# Patient Record
Sex: Female | Born: 1990
Health system: Southern US, Community
[De-identification: ages and names within clinical notes are randomized; demographics above are authoritative.]

## PROBLEM LIST (undated history)

## (undated) DIAGNOSIS — N83209 Unspecified ovarian cyst, unspecified side: Secondary | ICD-10-CM

## (undated) DIAGNOSIS — D573 Sickle-cell trait: Secondary | ICD-10-CM

## (undated) DIAGNOSIS — E669 Obesity, unspecified: Secondary | ICD-10-CM

## (undated) DIAGNOSIS — J45909 Unspecified asthma, uncomplicated: Secondary | ICD-10-CM

## (undated) HISTORY — PX: INCISION AND DRAINAGE ABSCESS: SHX5864

## (undated) HISTORY — PX: CARPAL TUNNEL RELEASE: SHX101

---
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
## 2010-05-04 ENCOUNTER — Emergency Department (HOSPITAL_COMMUNITY): Admission: EM | Admit: 2010-05-04 | Discharge: 2010-05-04 | Payer: Self-pay | Admitting: Emergency Medicine

## 2010-09-11 LAB — URINALYSIS, ROUTINE W REFLEX MICROSCOPIC
Bilirubin Urine: NEGATIVE
Hgb urine dipstick: NEGATIVE
Ketones, ur: NEGATIVE mg/dL
Nitrite: NEGATIVE
Protein, ur: NEGATIVE mg/dL
Specific Gravity, Urine: 1.015 (ref 1.005–1.030)
Urobilinogen, UA: 0.2 mg/dL (ref 0.0–1.0)

## 2010-09-11 LAB — DIFFERENTIAL
Basophils Absolute: 0 10*3/uL (ref 0.0–0.1)
Basophils Relative: 0 % (ref 0–1)
Lymphocytes Relative: 20 % (ref 12–46)
Monocytes Absolute: 0.4 10*3/uL (ref 0.1–1.0)
Neutro Abs: 4.8 10*3/uL (ref 1.7–7.7)
Neutrophils Relative %: 72 % (ref 43–77)

## 2010-09-11 LAB — CBC
Hemoglobin: 12.8 g/dL (ref 12.0–15.0)
MCHC: 34.7 g/dL (ref 30.0–36.0)
Platelets: 188 10*3/uL (ref 150–400)
RDW: 12.1 % (ref 11.5–15.5)

## 2010-09-11 LAB — BASIC METABOLIC PANEL
BUN: 8 mg/dL (ref 6–23)
CO2: 26 mEq/L (ref 19–32)
Calcium: 9.1 mg/dL (ref 8.4–10.5)
Creatinine, Ser: 0.78 mg/dL (ref 0.4–1.2)
GFR calc non Af Amer: >60 mL/min (ref >60)
Glucose, Bld: 89 mg/dL (ref 70–99)
Sodium: 142 mEq/L (ref 135–145)

## 2010-09-11 LAB — POCT PREGNANCY, URINE: Preg Test, Ur: NEGATIVE

## 2010-09-12 LAB — POCT I-STAT, CHEM 8
BUN: 3 mg/dL — ABNORMAL LOW (ref 6–23)
Chloride: 105 mEq/L (ref 96–112)
HCT: 38 % (ref 36.0–46.0)
Potassium: 3.4 mEq/L — ABNORMAL LOW (ref 3.5–5.1)

## 2010-09-12 LAB — DIFFERENTIAL
Basophils Absolute: 0 10*3/uL (ref 0.0–0.1)
Basophils Relative: 0 % (ref 0–1)
Eosinophils Absolute: 0.1 10*3/uL (ref 0.0–0.7)
Eosinophils Relative: 1 % (ref 0–5)
Lymphocytes Relative: 20 % (ref 12–46)
Lymphs Abs: 1.4 10*3/uL (ref 0.7–4.0)
Monocytes Relative: 5 % (ref 3–12)
Neutro Abs: 5.4 10*3/uL (ref 1.7–7.7)
Neutrophils Relative %: 75 % (ref 43–77)

## 2010-09-12 LAB — URINALYSIS, ROUTINE W REFLEX MICROSCOPIC
Bilirubin Urine: NEGATIVE
Hgb urine dipstick: NEGATIVE
Nitrite: NEGATIVE
Specific Gravity, Urine: 1.005 (ref 1.005–1.030)
Specific Gravity, Urine: 1.014 (ref 1.005–1.030)
Urobilinogen, UA: 0.2 mg/dL (ref 0.0–1.0)
Urobilinogen, UA: 1 mg/dL (ref 0.0–1.0)
pH: 6 (ref 5.0–8.0)

## 2010-09-12 LAB — CBC
Hemoglobin: 12.5 g/dL (ref 12.0–15.0)
MCHC: 34.7 g/dL (ref 30.0–36.0)
RBC: 3.89 MIL/uL (ref 3.87–5.11)
RBC: 4.01 MIL/uL (ref 3.87–5.11)
WBC: 7 10*3/uL (ref 4.0–10.5)

## 2010-09-12 LAB — COMPREHENSIVE METABOLIC PANEL
ALT: 9 U/L (ref 0–35)
AST: 16 U/L (ref 0–37)
Alkaline Phosphatase: 36 U/L — ABNORMAL LOW (ref 39–117)
CO2: 27 mEq/L (ref 19–32)
Calcium: 8.6 mg/dL (ref 8.4–10.5)
Chloride: 108 mEq/L (ref 96–112)
GFR calc Af Amer: >60 mL/min (ref >60)
GFR calc non Af Amer: >60 mL/min (ref >60)
Potassium: 3.8 mEq/L (ref 3.5–5.1)
Sodium: 140 mEq/L (ref 135–145)
Total Bilirubin: 0.6 mg/dL (ref 0.3–1.2)

## 2010-09-12 LAB — WET PREP, GENITAL: Yeast Wet Prep HPF POC: NONE SEEN

## 2010-09-12 LAB — GC/CHLAMYDIA PROBE AMP, GENITAL: Chlamydia, DNA Probe: NEGATIVE

## 2010-09-12 LAB — POCT PREGNANCY, URINE: Preg Test, Ur: NEGATIVE

## 2010-09-12 LAB — LIPASE, BLOOD: Lipase: 18 U/L (ref 11–59)

## 2011-02-20 ENCOUNTER — Emergency Department (HOSPITAL_COMMUNITY)
Admission: EM | Admit: 2011-02-20 | Discharge: 2011-02-20 | Disposition: A | Discharge status: Home / Self Care | Payer: Self-pay | Attending: Emergency Medicine | Admitting: Emergency Medicine

## 2011-02-20 DIAGNOSIS — K089 Disorder of teeth and supporting structures, unspecified: Secondary | ICD-10-CM | POA: Insufficient documentation

## 2011-02-20 DIAGNOSIS — K029 Dental caries, unspecified: Secondary | ICD-10-CM | POA: Insufficient documentation

## 2011-03-15 LAB — URINALYSIS, ROUTINE W REFLEX MICROSCOPIC
Bilirubin Urine: NEGATIVE
Ketones, ur: NEGATIVE
Nitrite: NEGATIVE
Urobilinogen, UA: 0.2

## 2011-03-15 LAB — COMPREHENSIVE METABOLIC PANEL
ALT: 12
AST: 20
CO2: 23
Calcium: 9
Creatinine, Ser: 0.81
Glucose, Bld: 111 — ABNORMAL HIGH
Sodium: 136
Total Protein: 7

## 2011-03-15 LAB — URINE CULTURE: Colony Count: >=100000

## 2011-03-15 LAB — LIPASE, BLOOD: Lipase: 17

## 2011-03-15 LAB — ETHANOL: Alcohol, Ethyl (B): <5

## 2011-03-19 LAB — POCT PREGNANCY, URINE: Operator id: 239701

## 2011-06-07 ENCOUNTER — Emergency Department (HOSPITAL_COMMUNITY)
Admission: EM | Admit: 2011-06-07 | Discharge: 2011-06-08 | Disposition: A | Discharge status: Home / Self Care | Payer: Self-pay | Attending: Emergency Medicine | Admitting: Emergency Medicine

## 2011-06-07 ENCOUNTER — Encounter: Payer: Self-pay | Admitting: *Deleted

## 2011-06-07 DIAGNOSIS — IMO0002 Reserved for concepts with insufficient information to code with codable children: Secondary | ICD-10-CM | POA: Insufficient documentation

## 2011-06-07 DIAGNOSIS — T07XXXA Unspecified multiple injuries, initial encounter: Secondary | ICD-10-CM | POA: Insufficient documentation
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
## 2011-06-08 ENCOUNTER — Emergency Department (HOSPITAL_COMMUNITY): Payer: Self-pay

## 2011-06-08 MED ORDER — TETANUS-DIPHTH-ACELL PERTUSSIS 5-2.5-18.5 LF-MCG/0.5 IM SUSP
0.5000 mL | Freq: Once | INTRAMUSCULAR | Status: AC
  Administered 2011-06-08: 0.5 mL via INTRAMUSCULAR
  Filled 2011-06-08: qty 0.5

## 2011-06-08 MED ORDER — OXYCODONE-ACETAMINOPHEN 5-325 MG PO TABS
1.0000 | ORAL_TABLET | Freq: Once | ORAL | Status: AC
  Administered 2011-06-08: 1 via ORAL
  Filled 2011-06-08: qty 1

## 2011-06-08 MED ORDER — BACITRACIN-NEOMYCIN-POLYMYXIN 400-5-5000 EX OINT
TOPICAL_OINTMENT | Freq: Once | CUTANEOUS | Status: AC
  Administered 2011-06-08: 01:00:00 via TOPICAL
  Filled 2011-06-08: qty 1

## 2011-06-08 NOTE — ED Notes (Signed)
Patient stable upon discharge.  
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
## 2011-08-20 ENCOUNTER — Emergency Department (HOSPITAL_COMMUNITY)
Admission: EM | Admit: 2011-08-20 | Discharge: 2011-08-21 | Disposition: A | Discharge status: Home Health | Payer: Self-pay | Attending: Emergency Medicine | Admitting: Emergency Medicine

## 2011-08-20 ENCOUNTER — Encounter (HOSPITAL_COMMUNITY): Payer: Self-pay | Admitting: *Deleted

## 2011-08-20 DIAGNOSIS — R10816 Epigastric abdominal tenderness: Secondary | ICD-10-CM | POA: Insufficient documentation

## 2011-08-20 DIAGNOSIS — F17200 Nicotine dependence, unspecified, uncomplicated: Secondary | ICD-10-CM | POA: Insufficient documentation

## 2011-08-20 DIAGNOSIS — R109 Unspecified abdominal pain: Secondary | ICD-10-CM | POA: Insufficient documentation

## 2011-08-20 DIAGNOSIS — R1110 Vomiting, unspecified: Secondary | ICD-10-CM | POA: Insufficient documentation

## 2011-08-20 DIAGNOSIS — R197 Diarrhea, unspecified: Secondary | ICD-10-CM | POA: Insufficient documentation

## 2011-08-20 DIAGNOSIS — Z79899 Other long term (current) drug therapy: Secondary | ICD-10-CM | POA: Insufficient documentation

## 2011-08-20 DIAGNOSIS — J45909 Unspecified asthma, uncomplicated: Secondary | ICD-10-CM | POA: Insufficient documentation

## 2011-08-20 LAB — URINALYSIS, ROUTINE W REFLEX MICROSCOPIC
Glucose, UA: NEGATIVE mg/dL
Ketones, ur: NEGATIVE mg/dL
Nitrite: NEGATIVE
Protein, ur: NEGATIVE mg/dL

## 2011-08-20 LAB — POCT PREGNANCY, URINE: Preg Test, Ur: NEGATIVE

## 2011-08-20 MED ORDER — ONDANSETRON 4 MG PO TBDP
4.0000 mg | ORAL_TABLET | Freq: Once | ORAL | Status: AC
  Administered 2011-08-20: 4 mg via ORAL
  Filled 2011-08-20: qty 1

## 2011-08-20 NOTE — ED Notes (Signed)
The pt has had abd pain and vomiting with diarrhea since 0600am today.  She ate some fish last pm and  She reheated it and she has been ill since them

## 2011-08-21 MED ORDER — ONDANSETRON HCL 4 MG/2ML IJ SOLN
4.0000 mg | Freq: Once | INTRAMUSCULAR | Status: AC
  Administered 2011-08-21: 4 mg via INTRAVENOUS
  Filled 2011-08-21: qty 2

## 2011-08-21 MED ORDER — SODIUM CHLORIDE 0.9 % IV BOLUS (SEPSIS)
1000.0000 mL | Freq: Once | INTRAVENOUS | Status: AC
  Administered 2011-08-21: 1000 mL via INTRAVENOUS

## 2011-08-21 MED ORDER — PROMETHAZINE HCL 25 MG PO TABS: 12.5000 mg | ORAL_TABLET | Freq: Four times a day (QID) | ORAL | Status: DC | PRN

## 2011-08-21 NOTE — ED Provider Notes (Signed)
Medical screening examination/treatment/procedure(s) were performed by non-physician practitioner and as supervising physician I was immediately available for consultation/collaboration.   Alita Waldren A. Patrica Duel, MD 08/21/11 2255

## 2011-08-21 NOTE — Discharge Instructions (Signed)
B.R.A.T. Diet    Your doctor has recommended the B.R.A.T. diet for you or your child until the condition improves. This is often used to help control diarrhea and vomiting symptoms. If you or your child can tolerate clear liquids, you may have:  · Bananas.   · Rice.   · Applesauce.   · Toast (and other simple starches such as crackers, potatoes, noodles).   Be sure to avoid dairy products, meats, and fatty foods until symptoms are better. Fruit juices such as apple, grape, and prune juice can make diarrhea worse. Avoid these. Continue this diet for 2 days or as instructed by your caregiver.  Document Released: 06/11/2005 Document Revised: 02/21/2011 Document Reviewed: 11/28/2006  [employer]® Patient Information ©2012 [employer].

## 2011-08-21 NOTE — ED Provider Notes (Signed)
History     CSN: [id number]  Arrival date & time 08/20/11  2116   First MD Initiated Contact with Patient 08/21/11 0011      Chief Complaint  Patient presents with  . Emesis    (Consider location/radiation/quality/duration/timing/severity/associated sxs/prior treatment) HPI Comments: Patient developed vomiting and diarrhea after eating, we heated fish from her refrigerator  Patient is a 21 y.o. female presenting with vomiting. The history is provided by the patient.  Emesis  This is a new problem. The current episode started yesterday. The problem occurs more than 10 times per day. The problem has not changed since onset.The emesis has an appearance of bilious material. There has been no fever. Associated symptoms include abdominal pain and diarrhea. Pertinent negatives include no chills and no fever. Risk factors include suspect food intake.    Past Medical History  Diagnosis Date  . Asthma     History reviewed. No pertinent past surgical history.  History reviewed. No pertinent family history.  History  Substance Use Topics  . Smoking status: Current Some Day Smoker  . Smokeless tobacco: Not on file  . Alcohol Use: Yes    OB History    Grav Para Term Preterm Abortions TAB SAB Ect Mult Living                  Review of Systems  Constitutional: Negative for fever and chills.  Respiratory: Negative for shortness of breath.   Gastrointestinal: Positive for vomiting, abdominal pain and diarrhea.  Genitourinary: Negative for dysuria and flank pain.  Neurological: Negative for dizziness and weakness.    Allergies  Review of patient's allergies indicates no known allergies.  Home Medications   Current Outpatient Rx  Name Route Sig Dispense Refill  . ACETAMINOPHEN 325 MG PO TABS Oral Take 650 mg by mouth every 6 (six) hours as needed. For pain    . ALBUTEROL SULFATE HFA 108 (90 BASE) MCG/ACT IN AERS Inhalation Inhale 2 puffs into the lungs every 6 (six) hours as  needed. For shortness of breath    . IBUPROFEN 200 MG PO TABS Oral Take 800 mg by mouth every 6 (six) hours as needed. Migraines    . PROMETHAZINE HCL 25 MG PO TABS Oral Take 0.5 tablets (12.5 mg total) by mouth every 6 (six) hours as needed for nausea. 30 tablet 0    BP 114/62  Pulse 105  Temp(Src) 99 F (37.2 C) (Oral)  Resp 24  SpO2 100%  LMP 07/05/2011  Physical Exam  Constitutional: She is oriented to person, place, and time. She appears well-developed and well-nourished.  HENT:  Head: Normocephalic.  Neck: Normal range of motion.  Pulmonary/Chest: Effort normal.  Abdominal: She exhibits no distension. There is tenderness in the epigastric area.  Musculoskeletal: Normal range of motion.  Neurological: She is alert and oriented to person, place, and time.  Skin: Skin is warm.    ED Course  Procedures (including critical care time)  Labs Reviewed  URINALYSIS, ROUTINE W REFLEX MICROSCOPIC - Abnormal; Notable for the following:    APPearance CLOUDY (*)    Hgb urine dipstick SMALL (*)    Leukocytes, UA SMALL (*)    All other components within normal limits  URINE MICROSCOPIC-ADD ON - Abnormal; Notable for the following:    Squamous Epithelial / LPF MANY (*)    All other components within normal limits  POCT PREGNANCY, URINE   No results found.   1. Vomiting and diarrhea  MDM  We'll hydrate with IV fluid, and controlled nausea with, antiemetics.  We'll reassess after 1 L  Feels better after by mouth Zofran.  We'll offer by mouth challenge  Tolerating by mouth challenge, asking for food will discharge home with prescription for pain    Arman Filter, NP 08/21/11 0034  Arman Filter, NP 08/21/11 0327  Arman Filter, NP 08/21/11 0329  Arman Filter, NP 08/21/11 0330

## 2011-08-21 NOTE — ED Notes (Signed)
Pt ambulatory to bathroom st's feels better at this time

## 2011-08-21 NOTE — ED Notes (Signed)
Pt c/o nausea, vomiting and diarrhea onset last pm worse this am.

## 2012-05-13 ENCOUNTER — Emergency Department (HOSPITAL_COMMUNITY)
Admission: EM | Admit: 2012-05-13 | Discharge: 2012-05-13 | Discharge status: Against Medical Advice | Payer: Self-pay | Attending: Emergency Medicine | Admitting: Emergency Medicine

## 2012-05-13 ENCOUNTER — Encounter (HOSPITAL_COMMUNITY): Payer: Self-pay | Admitting: Emergency Medicine

## 2012-05-13 DIAGNOSIS — J111 Influenza due to unidentified influenza virus with other respiratory manifestations: Secondary | ICD-10-CM | POA: Insufficient documentation

## 2012-05-13 DIAGNOSIS — F17200 Nicotine dependence, unspecified, uncomplicated: Secondary | ICD-10-CM | POA: Insufficient documentation
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
## 2012-05-13 NOTE — ED Notes (Signed)
Second call for pt

## 2012-05-13 NOTE — ED Notes (Signed)
Pt called for room 

## 2013-01-27 ENCOUNTER — Encounter (HOSPITAL_COMMUNITY): Payer: Self-pay | Admitting: *Deleted

## 2013-01-27 ENCOUNTER — Emergency Department (HOSPITAL_COMMUNITY)
Admission: EM | Admit: 2013-01-27 | Discharge: 2013-01-28 | Disposition: A | Discharge status: Home / Self Care | Payer: Self-pay | Attending: Emergency Medicine | Admitting: Emergency Medicine

## 2013-01-27 DIAGNOSIS — R1110 Vomiting, unspecified: Secondary | ICD-10-CM | POA: Insufficient documentation

## 2013-01-27 DIAGNOSIS — Z3202 Encounter for pregnancy test, result negative: Secondary | ICD-10-CM | POA: Insufficient documentation

## 2013-01-27 DIAGNOSIS — F17200 Nicotine dependence, unspecified, uncomplicated: Secondary | ICD-10-CM | POA: Insufficient documentation

## 2013-01-27 DIAGNOSIS — R109 Unspecified abdominal pain: Secondary | ICD-10-CM | POA: Insufficient documentation

## 2013-01-27 DIAGNOSIS — R197 Diarrhea, unspecified: Secondary | ICD-10-CM | POA: Insufficient documentation

## 2013-01-27 DIAGNOSIS — Z79899 Other long term (current) drug therapy: Secondary | ICD-10-CM | POA: Insufficient documentation

## 2013-01-27 DIAGNOSIS — J45909 Unspecified asthma, uncomplicated: Secondary | ICD-10-CM | POA: Insufficient documentation

## 2013-01-27 LAB — COMPREHENSIVE METABOLIC PANEL
Albumin: 3.4 g/dL — ABNORMAL LOW (ref 3.5–5.2)
Alkaline Phosphatase: 42 U/L (ref 39–117)
BUN: 9 mg/dL (ref 6–23)
Creatinine, Ser: 0.71 mg/dL (ref 0.50–1.10)
GFR calc Af Amer: >90 mL/min (ref >90)
Glucose, Bld: 95 mg/dL (ref 70–99)
Potassium: 3.8 mEq/L (ref 3.5–5.1)
Total Protein: 6.7 g/dL (ref 6.0–8.3)

## 2013-01-27 LAB — CBC WITH DIFFERENTIAL/PLATELET
Basophils Relative: 0 % (ref 0–1)
Eosinophils Absolute: 0.1 10*3/uL (ref 0.0–0.7)
Eosinophils Relative: 1 % (ref 0–5)
HCT: 36.4 % (ref 36.0–46.0)
Hemoglobin: 13.2 g/dL (ref 12.0–15.0)
Lymphs Abs: 1.5 10*3/uL (ref 0.7–4.0)
MCH: 30.6 pg (ref 26.0–34.0)
MCHC: 36.3 g/dL — ABNORMAL HIGH (ref 30.0–36.0)
MCV: 84.3 fL (ref 78.0–100.0)
Monocytes Absolute: 0.3 10*3/uL (ref 0.1–1.0)
Monocytes Relative: 4 % (ref 3–12)
Neutrophils Relative %: 73 % (ref 43–77)
RBC: 4.32 MIL/uL (ref 3.87–5.11)

## 2013-01-27 LAB — URINALYSIS, ROUTINE W REFLEX MICROSCOPIC
Ketones, ur: NEGATIVE mg/dL
Nitrite: NEGATIVE
Protein, ur: NEGATIVE mg/dL
pH: 5.5 (ref 5.0–8.0)

## 2013-01-27 LAB — POCT PREGNANCY, URINE: Preg Test, Ur: NEGATIVE

## 2013-01-27 LAB — URINE MICROSCOPIC-ADD ON

## 2013-01-27 MED ORDER — SODIUM CHLORIDE 0.9 % IV BOLUS (SEPSIS)
1000.0000 mL | Freq: Once | INTRAVENOUS | Status: AC
  Administered 2013-01-27: 1000 mL via INTRAVENOUS

## 2013-01-27 MED ORDER — ONDANSETRON HCL 4 MG/2ML IJ SOLN
4.0000 mg | Freq: Once | INTRAMUSCULAR | Status: AC
  Administered 2013-01-27: 4 mg via INTRAVENOUS
  Filled 2013-01-27: qty 2

## 2013-01-27 NOTE — ED Notes (Signed)
Pt states stung by a bee a work the other day and then got dizzy.  Pt states then went to [employer] then started having vomiting and diarrhea with lower abdominal pain

## 2013-01-27 NOTE — ED Notes (Addendum)
2310  Received report from off going RN and introduced myself to the pt.  Pt is relaxing in the bed at this time and has no need but will continue to monitor  0100  Pt feels a lot better at this time.  Fluids finished and pt relaxing in the bed

## 2013-01-28 MED ORDER — ONDANSETRON 8 MG PO TBDP: ORAL_TABLET | ORAL | Status: DC

## 2013-01-28 MED ORDER — METOCLOPRAMIDE HCL 10 MG PO TABS: 10.0000 mg | ORAL_TABLET | Freq: Four times a day (QID) | ORAL | Status: DC | PRN

## 2013-01-28 MED ORDER — HYDROCODONE-ACETAMINOPHEN 5-325 MG PO TABS: 2.0000 | ORAL_TABLET | ORAL | Status: DC | PRN

## 2013-01-28 NOTE — Discharge Instructions (Signed)

## 2013-01-28 NOTE — ED Provider Notes (Signed)
CSN: [id number]     Arrival date & time 01/27/13  1641 History     First MD Initiated Contact with Patient 01/27/13 2037     Chief Complaint  Patient presents with  . Abdominal Pain  . Emesis  . Diarrhea   (Consider location/radiation/quality/duration/timing/severity/associated sxs/prior Treatment) HPI One day of multiple spells of nonbloody vomiting and diarrhea with intermittent diffuse abdominal pain now pain-free with abdomen soft nontender feels better in the ED after IV fluids and Zofran no dysuria no vaginal bleeding no vaginal discharge. Past Medical History  Diagnosis Date  . Asthma    No past surgical history on file. No family history on file. History  Substance Use Topics  . Smoking status: Current Some Day Smoker  . Smokeless tobacco: Not on file  . Alcohol Use: Yes   OB History   Grav Para Term Preterm Abortions TAB SAB Ect Mult Living                 Review of Systems 10 Systems reviewed and are negative for acute change except as noted in the HPI. Allergies  Review of patient's allergies indicates no known allergies.  Home Medications   Current Outpatient Rx  Name  Route  Sig  Dispense  Refill  . albuterol (PROVENTIL HFA;VENTOLIN HFA) 108 (90 BASE) MCG/ACT inhaler   Inhalation   Inhale 2 puffs into the lungs every 6 (six) hours as needed. For shortness of breath         . HYDROcodone-acetaminophen (NORCO/VICODIN) 5-325 MG per tablet   Oral   Take 1 tablet by mouth every 6 (six) hours as needed for pain.         Marland Kitchen ibuprofen (ADVIL,MOTRIN) 200 MG tablet   Oral   Take 800 mg by mouth every 6 (six) hours as needed for pain.          . Multiple Vitamins-Minerals (MULTIVITAMIN PO)   Oral   Take 1 tablet by mouth daily.         Marland Kitchen HYDROcodone-acetaminophen (NORCO) 5-325 MG per tablet   Oral   Take 2 tablets by mouth every 4 (four) hours as needed for pain.   6 tablet   0   . metoCLOPramide (REGLAN) 10 MG tablet   Oral   Take 1 tablet (10  mg total) by mouth every 6 (six) hours as needed (nausea/headache).   6 tablet   0   . ondansetron (ZOFRAN ODT) 8 MG disintegrating tablet      8mg  ODT q4 hours prn nausea   4 tablet   0    BP 115/66  Pulse 69  Temp(Src) 98 F (36.7 C) (Oral)  Resp 16  SpO2 99%  LMP 01/24/2013 Physical Exam  Nursing note and vitals reviewed. Constitutional:  Awake, alert, nontoxic appearance.  HENT:  Head: Atraumatic.  Eyes: Right eye exhibits no discharge. Left eye exhibits no discharge.  Neck: Neck supple.  Cardiovascular: Normal rate and regular rhythm.   No murmur heard. Pulmonary/Chest: Effort normal and breath sounds normal. No respiratory distress. She has no wheezes. She has no rales. She exhibits no tenderness.  Abdominal: Soft. Bowel sounds are normal. She exhibits no distension and no mass. There is no tenderness. There is no rebound and no guarding.  Musculoskeletal: She exhibits no edema and no tenderness.  Baseline ROM, no obvious new focal weakness.  Neurological: She is alert.  Mental status and motor strength appears baseline for patient and situation.  Skin: No rash  noted.  Psychiatric: She has a normal mood and affect.    ED Course   Procedures (including critical care time) Patient informed of clinical course, understand medical decision-making process, and agree with plan. Labs Reviewed  CBC WITH DIFFERENTIAL - Abnormal; Notable for the following:    MCHC 36.3 (*)    All other components within normal limits  COMPREHENSIVE METABOLIC PANEL - Abnormal; Notable for the following:    Albumin 3.4 (*)    All other components within normal limits  URINALYSIS, ROUTINE W REFLEX MICROSCOPIC - Abnormal; Notable for the following:    Hgb urine dipstick LARGE (*)    Leukocytes, UA TRACE (*)    All other components within normal limits  URINE MICROSCOPIC-ADD ON - Abnormal; Notable for the following:    Squamous Epithelial / LPF MANY (*)    Bacteria, UA FEW (*)    All  other components within normal limits  POCT PREGNANCY, URINE   No results found. 1. Abdominal pain   2. Vomiting and diarrhea     MDM  I doubt any other EMC precluding discharge at this time including, but not necessarily limited to the following:SBI, peritonitis.  Hurman Horn, MD 01/28/13 1726

## 2013-05-13 ENCOUNTER — Encounter (HOSPITAL_COMMUNITY): Payer: Self-pay | Admitting: Emergency Medicine

## 2013-05-13 ENCOUNTER — Emergency Department (HOSPITAL_COMMUNITY)
Admission: EM | Admit: 2013-05-13 | Discharge: 2013-05-13 | Disposition: A | Discharge status: Home / Self Care | Payer: Self-pay | Attending: Emergency Medicine | Admitting: Emergency Medicine

## 2013-05-13 DIAGNOSIS — F17200 Nicotine dependence, unspecified, uncomplicated: Secondary | ICD-10-CM | POA: Insufficient documentation

## 2013-05-13 DIAGNOSIS — K089 Disorder of teeth and supporting structures, unspecified: Secondary | ICD-10-CM | POA: Insufficient documentation

## 2013-05-13 DIAGNOSIS — B354 Tinea corporis: Secondary | ICD-10-CM | POA: Insufficient documentation

## 2013-05-13 DIAGNOSIS — J45909 Unspecified asthma, uncomplicated: Secondary | ICD-10-CM | POA: Insufficient documentation

## 2013-05-13 MED ORDER — TERBINAFINE HCL 1 % EX CREA: 1.0000 "application " | TOPICAL_CREAM | Freq: Two times a day (BID) | CUTANEOUS | Status: DC

## 2013-05-13 MED ORDER — TERBINAFINE HCL 250 MG PO TABS: 250.0000 mg | ORAL_TABLET | Freq: Every day | ORAL | Status: DC

## 2013-05-13 NOTE — ED Provider Notes (Signed)
CSN: [id number]     Arrival date & time 05/13/13  1557 History  This chart was scribed for Junius Finner, PA, working with Raeford Razor, MD, by Ardelia Mems ED Scribe. This patient was seen in room TR01C/TR01C and the patient's care was started at 4:30 PM.   Chief Complaint  Patient presents with  . Rash   The history is provided by the patient. No language interpreter was used.    HPI Comments: Leslie Yates is a 22 y.o. female who presents to the Emergency Department complaining of a gradually spreading, itchy rash onset 4 days ago. She states that the rash began on her chest and has spread to her under her breasts, her abdomen and her neck. She states that she recently stayed at a hotel. She also states that she has been taking Ibuprofen for dental pain, but that she has taken this before without developing a rash. She denies using any new detergents or soaps. She states that she applied Alcohol to her rash without relief. She states that she has not used any creams or Benadryl. She states that she has no known allergies. She denies any other symptoms. No sick contacts with similar rash.  PCP- None   Past Medical History  Diagnosis Date  . Asthma    History reviewed. No pertinent past surgical history. No family history on file. History  Substance Use Topics  . Smoking status: Current Some Day Smoker  . Smokeless tobacco: Not on file  . Alcohol Use: Yes   OB History   Grav Para Term Preterm Abortions TAB SAB Ect Mult Living                 Review of Systems  Constitutional: Negative for fever and chills.  Gastrointestinal: Negative for nausea and vomiting.  Skin: Positive for rash.  All other systems reviewed and are negative.    Allergies  Review of patient's allergies indicates no known allergies.  Home Medications   Current Outpatient Rx  Name  Route  Sig  Dispense  Refill  . ibuprofen (ADVIL,MOTRIN) 200 MG tablet   Oral   Take 800 mg by mouth every 6 (six)  hours as needed for pain.          Marland Kitchen terbinafine (LAMISIL) 1 % cream   Topical   Apply 1 application topically 2 (two) times daily.   30 g   0   . terbinafine (LAMISIL) 250 MG tablet   Oral   Take 1 tablet (250 mg total) by mouth daily.   14 tablet   0    Triage Vitals: BP 119/66  Pulse 82  Temp(Src) 97.6 F (36.4 C) (Oral)  Resp 18  SpO2 100%  Physical Exam  Nursing note and vitals reviewed. Constitutional: She appears well-developed and well-nourished. No distress.  HENT:  Head: Normocephalic and atraumatic.  Eyes: Conjunctivae are normal. No scleral icterus.  Neck: Normal range of motion.  Cardiovascular: Normal rate, regular rhythm and normal heart sounds.   Pulmonary/Chest: Effort normal and breath sounds normal. No respiratory distress. She has no wheezes. She has no rales. She exhibits no tenderness.  Abdominal: Soft. Bowel sounds are normal. She exhibits no distension and no mass. There is no tenderness. There is no rebound and no guarding.  Musculoskeletal: Normal range of motion.  Neurological: She is alert.  Skin: Skin is warm and dry. Rash noted. She is not diaphoretic.  Diffuse, erythematous scaling patches to chest, abdomen and neck.  ED Course  Procedures (including critical care time)  DIAGNOSTIC STUDIES: Oxygen Saturation is 100% on RA, normal by my interpretation.    COORDINATION OF CARE: 4:38 PM- Advised pt of plan to be discharged with medication for her rash. Pt advised of plan for treatment and pt agrees.  Labs Review Labs Reviewed - No data to display Imaging Review No results found.  EKG Interpretation   None       MDM   1. Tinea corporis    Rash appears consistent with tinea corporis. Rx: terbinafine. Advised to f/u with [facility] if rash does not improve with medication as she may need referral to dermatology. Return precautions provided. Pt verbalized understanding and agreement with tx plan.   I personally  performed the services described in this documentation, which was scribed in my presence. The recorded information has been reviewed and is accurate.   Junius Finner, PA-C 05/13/13 [phone]

## 2013-05-13 NOTE — ED Notes (Signed)
The pt is c/o a rash on her upper torso since Saturday when she spent the weekend in a hotel in [place]

## 2013-05-14 NOTE — ED Provider Notes (Signed)
Medical screening examination/treatment/procedure(s) were performed by non-physician practitioner and as supervising physician I was immediately available for consultation/collaboration.  EKG Interpretation   None        Raeford Razor, MD 05/14/13 [phone]

## 2013-05-17 ENCOUNTER — Emergency Department (HOSPITAL_COMMUNITY)
Admission: EM | Admit: 2013-05-17 | Discharge: 2013-05-18 | Disposition: A | Discharge status: Home / Self Care | Payer: Self-pay | Attending: Emergency Medicine | Admitting: Emergency Medicine

## 2013-05-17 ENCOUNTER — Encounter (HOSPITAL_COMMUNITY): Payer: Self-pay | Admitting: Emergency Medicine

## 2013-05-17 DIAGNOSIS — Z79899 Other long term (current) drug therapy: Secondary | ICD-10-CM | POA: Insufficient documentation

## 2013-05-17 DIAGNOSIS — K5900 Constipation, unspecified: Secondary | ICD-10-CM | POA: Insufficient documentation

## 2013-05-17 DIAGNOSIS — J45909 Unspecified asthma, uncomplicated: Secondary | ICD-10-CM | POA: Insufficient documentation

## 2013-05-17 DIAGNOSIS — F17200 Nicotine dependence, unspecified, uncomplicated: Secondary | ICD-10-CM | POA: Insufficient documentation

## 2013-05-17 DIAGNOSIS — K602 Anal fissure, unspecified: Secondary | ICD-10-CM | POA: Insufficient documentation

## 2013-05-17 NOTE — ED Notes (Signed)
Per EMS pt c/o constipation. Pt reports taking milk of mag shortly before calling EMS. Pt reports making stool but hurts to try and go.

## 2013-05-17 NOTE — ED Notes (Signed)
Pt states that she was attempting to have a BM and was unable to; pt states that it hurts to try or to sit from the pressure to her rectum

## 2013-05-18 MED ORDER — DOCUSATE SODIUM 100 MG PO CAPS: 100.0000 mg | ORAL_CAPSULE | Freq: Two times a day (BID) | ORAL | Status: DC

## 2013-05-18 MED ORDER — LIDOCAINE HCL 4 % EX SOLN
Freq: Once | CUTANEOUS | Status: AC
  Administered 2013-05-18: 50 mL via TOPICAL
  Filled 2013-05-18: qty 50

## 2013-05-18 MED ORDER — FLEET ENEMA 7-19 GM/118ML RE ENEM
1.0000 | ENEMA | Freq: Once | RECTAL | Status: AC
  Administered 2013-05-18: 1 via RECTAL
  Filled 2013-05-18: qty 1

## 2013-05-18 MED ORDER — NAPROXEN 500 MG PO TABS: 500.0000 mg | ORAL_TABLET | Freq: Two times a day (BID) | ORAL | Status: DC

## 2013-05-18 MED ORDER — IBUPROFEN 800 MG PO TABS
800.0000 mg | ORAL_TABLET | Freq: Once | ORAL | Status: AC
  Administered 2013-05-18: 800 mg via ORAL
  Filled 2013-05-18: qty 1

## 2013-05-18 NOTE — ED Notes (Signed)
MD at bedside. 

## 2013-05-18 NOTE — ED Provider Notes (Signed)
CSN: [id number]     Arrival date & time 05/17/13  2147 History   First MD Initiated Contact with Patient 05/18/13 0008     Chief Complaint  Patient presents with  . Constipation    HPI Pt presents to the ED with complaints of constipation.  She does not remember when her last bowel movement was.  Maybe last week.  Tonight she tried to have a BM but was unable to and it hurt.  She is having significant pain in her rectum now. No vomiting or fever.  No prior abdominal surgeries. Pt is having pain in the rectal area.  She tried mild of magnesia without relief. Past Medical History  Diagnosis Date  . Asthma    History reviewed. No pertinent past surgical history. No family history on file. History  Substance Use Topics  . Smoking status: Current Some Day Smoker -- 0.50 packs/day    Types: Cigarettes  . Smokeless tobacco: Not on file  . Alcohol Use: Yes   OB History   Grav Para Term Preterm Abortions TAB SAB Ect Mult Living                 Review of Systems  All other systems reviewed and are negative.    Allergies  Review of patient's allergies indicates no known allergies.  Home Medications   Current Outpatient Rx  Name  Route  Sig  Dispense  Refill  . ibuprofen (ADVIL,MOTRIN) 200 MG tablet   Oral   Take 800 mg by mouth every 6 (six) hours as needed for pain.          Marland Kitchen terbinafine (LAMISIL) 1 % cream   Topical   Apply 1 application topically 2 (two) times daily.   30 g   0   . terbinafine (LAMISIL) 250 MG tablet   Oral   Take 1 tablet (250 mg total) by mouth daily.   14 tablet   0   . docusate sodium (COLACE) 100 MG capsule   Oral   Take 1 capsule (100 mg total) by mouth every 12 (twelve) hours.   60 capsule   0   . naproxen (NAPROSYN) 500 MG tablet   Oral   Take 1 tablet (500 mg total) by mouth 2 (two) times daily.   30 tablet   0    BP 115/77  Pulse 98  Temp(Src) 99.4 F (37.4 C) (Oral)  Resp 22  SpO2 100%  LMP 05/15/2013 Physical Exam   Nursing note and vitals reviewed. Constitutional: She appears well-developed and well-nourished. No distress.  HENT:  Head: Normocephalic and atraumatic.  Right Ear: External ear normal.  Left Ear: External ear normal.  Eyes: Conjunctivae are normal. Right eye exhibits no discharge. Left eye exhibits no discharge. No scleral icterus.  Neck: Neck supple. No tracheal deviation present.  Cardiovascular: Normal rate, regular rhythm and intact distal pulses.   Pulmonary/Chest: Effort normal and breath sounds normal. No stridor. No respiratory distress. She has no wheezes. She has no rales.  Abdominal: Soft. Bowel sounds are normal. She exhibits no distension. There is no tenderness. There is no rebound and no guarding.  Genitourinary: Rectal exam shows tenderness. Rectal exam shows no mass and anal tone normal.  Pt with significant pain during DRE, no fissure viewed but exam limited, no fecal impaction  Musculoskeletal: She exhibits no edema and no tenderness.  Neurological: She is alert. She has normal strength. No sensory deficit. Cranial nerve deficit:  no gross defecits noted.  She exhibits normal muscle tone. She displays no seizure activity. Coordination normal.  Skin: Skin is warm and dry. No rash noted.  Psychiatric: She has a normal mood and affect.    ED Course  Procedures (including critical care time) Labs Review Labs Reviewed - No data to display Imaging Review No results found.  EKG Interpretation   None       MDM   1. Constipation   2. Anal fissure     Suspect pt has an anal fissure.  Will apply lidocaine jelly and give patient an enema.  Pt improved after her enema.  Will dc home on colace.  Celene Kras, MD 05/18/13 [phone]

## 2013-06-12 ENCOUNTER — Emergency Department (HOSPITAL_COMMUNITY)
Admission: EM | Admit: 2013-06-12 | Discharge: 2013-06-12 | Disposition: A | Discharge status: Home / Self Care | Payer: Self-pay | Attending: Emergency Medicine | Admitting: Emergency Medicine

## 2013-06-12 ENCOUNTER — Encounter (HOSPITAL_COMMUNITY): Payer: Self-pay | Admitting: Emergency Medicine

## 2013-06-12 DIAGNOSIS — R001 Bradycardia, unspecified: Secondary | ICD-10-CM

## 2013-06-12 DIAGNOSIS — K089 Disorder of teeth and supporting structures, unspecified: Secondary | ICD-10-CM | POA: Insufficient documentation

## 2013-06-12 DIAGNOSIS — I498 Other specified cardiac arrhythmias: Secondary | ICD-10-CM | POA: Insufficient documentation

## 2013-06-12 DIAGNOSIS — F17200 Nicotine dependence, unspecified, uncomplicated: Secondary | ICD-10-CM | POA: Insufficient documentation

## 2013-06-12 DIAGNOSIS — K2970 Gastritis, unspecified, without bleeding: Secondary | ICD-10-CM | POA: Insufficient documentation

## 2013-06-12 DIAGNOSIS — T85848A Pain due to other internal prosthetic devices, implants and grafts, initial encounter: Secondary | ICD-10-CM

## 2013-06-12 DIAGNOSIS — G8918 Other acute postprocedural pain: Secondary | ICD-10-CM | POA: Insufficient documentation

## 2013-06-12 DIAGNOSIS — Z3202 Encounter for pregnancy test, result negative: Secondary | ICD-10-CM | POA: Insufficient documentation

## 2013-06-12 DIAGNOSIS — Z791 Long term (current) use of non-steroidal anti-inflammatories (NSAID): Secondary | ICD-10-CM | POA: Insufficient documentation

## 2013-06-12 DIAGNOSIS — J45909 Unspecified asthma, uncomplicated: Secondary | ICD-10-CM | POA: Insufficient documentation

## 2013-06-12 DIAGNOSIS — R197 Diarrhea, unspecified: Secondary | ICD-10-CM | POA: Insufficient documentation

## 2013-06-12 DIAGNOSIS — Z79899 Other long term (current) drug therapy: Secondary | ICD-10-CM | POA: Insufficient documentation

## 2013-06-12 LAB — URINALYSIS W MICROSCOPIC + REFLEX CULTURE
Hgb urine dipstick: NEGATIVE
Nitrite: NEGATIVE
Specific Gravity, Urine: 1.021 (ref 1.005–1.030)
Urobilinogen, UA: 0.2 mg/dL (ref 0.0–1.0)
pH: 5.5 (ref 5.0–8.0)

## 2013-06-12 LAB — CBC WITH DIFFERENTIAL/PLATELET
Basophils Absolute: 0 10*3/uL (ref 0.0–0.1)
Basophils Relative: 0 % (ref 0–1)
Eosinophils Absolute: 0.2 10*3/uL (ref 0.0–0.7)
Eosinophils Relative: 2 % (ref 0–5)
Lymphs Abs: 1.7 10*3/uL (ref 0.7–4.0)
MCH: 30.3 pg (ref 26.0–34.0)
MCHC: 35.2 g/dL (ref 30.0–36.0)
MCV: 86.1 fL (ref 78.0–100.0)
Platelets: 194 10*3/uL (ref 150–400)
RDW: 12.1 % (ref 11.5–15.5)

## 2013-06-12 LAB — COMPREHENSIVE METABOLIC PANEL
ALT: 7 U/L (ref 0–35)
Albumin: 3.4 g/dL — ABNORMAL LOW (ref 3.5–5.2)
Calcium: 8.3 mg/dL — ABNORMAL LOW (ref 8.4–10.5)
GFR calc Af Amer: >90 mL/min (ref >90)
Glucose, Bld: 90 mg/dL (ref 70–99)
Sodium: 140 mEq/L (ref 135–145)
Total Protein: 6.1 g/dL (ref 6.0–8.3)

## 2013-06-12 LAB — RAPID URINE DRUG SCREEN, HOSP PERFORMED
Barbiturates: NOT DETECTED
Tetrahydrocannabinol: POSITIVE — AB

## 2013-06-12 MED ORDER — SUCRALFATE 1 G PO TABS: 1.0000 g | ORAL_TABLET | Freq: Three times a day (TID) | ORAL | Status: DC

## 2013-06-12 MED ORDER — GI COCKTAIL ~~LOC~~
30.0000 mL | Freq: Once | ORAL | Status: AC
  Administered 2013-06-12: 30 mL via ORAL
  Filled 2013-06-12: qty 30

## 2013-06-12 MED ORDER — FAMOTIDINE IN NACL 20-0.9 MG/50ML-% IV SOLN
20.0000 mg | Freq: Once | INTRAVENOUS | Status: AC
  Administered 2013-06-12: 20 mg via INTRAVENOUS
  Filled 2013-06-12: qty 50

## 2013-06-12 MED ORDER — FAMOTIDINE 20 MG PO TABS: 20.0000 mg | ORAL_TABLET | Freq: Two times a day (BID) | ORAL | Status: DC

## 2013-06-12 MED ORDER — PROMETHAZINE HCL 25 MG PO TABS: 25.0000 mg | ORAL_TABLET | Freq: Four times a day (QID) | ORAL | Status: DC | PRN

## 2013-06-12 MED ORDER — SODIUM CHLORIDE 0.9 % IV BOLUS (SEPSIS)
1000.0000 mL | Freq: Once | INTRAVENOUS | Status: AC
  Administered 2013-06-12: 1000 mL via INTRAVENOUS

## 2013-06-12 NOTE — ED Provider Notes (Signed)
CSN: [id number]     Arrival date & time 06/12/13  1108 History   First MD Initiated Contact with Patient 06/12/13 1108     Chief Complaint  Patient presents with  . Abdominal Pain   (Consider location/radiation/quality/duration/timing/severity/associated sxs/prior Treatment) HPI Leslie KEEVEN is a 22 y.o. female and presents emergency department complaining of abdominal pain. Patient states pain began this morning. States has had multiple episodes of vomiting and episode of diarrhea. States pain mainly in epigastric area. States was feeling well yesterday. Pain began shortly after taking ibuprofen. She states that she has been taking ibuprofen every morning for toothache for the last 2 years. She states she in addition to that drinks Pepsi multiple times a day every day and is a smoker. She denies any blood in her stool or emesis. She also states that she is 9 days late for her menstrual period. States that she sexually active and does not use any protection and is not on any birth control. Patient denies any pain in the lower abdomen. She denies any urinary symptoms or vaginal discharge or bleeding. She denies any prior similar symptoms. She states she went to work when the pain got worse social left and was tried to take a bus to the hospital but pain was too severe and she called 911 from the bus station. She was given zofran by EMS which improver her nausea, however, pain continues. Pt describes pain as sharp. Read in the Center of her upper abdomen. Worse with laying down.  Past Medical History  Diagnosis Date  . Asthma    History reviewed. No pertinent past surgical history. No family history on file. History  Substance Use Topics  . Smoking status: Current Some Day Smoker -- 0.50 packs/day    Types: Cigarettes  . Smokeless tobacco: Not on file  . Alcohol Use: Yes   OB History   Grav Para Term Preterm Abortions TAB SAB Ect Mult Living                 Review of Systems   Constitutional: Negative for fever and chills.  Respiratory: Negative for cough, chest tightness and shortness of breath.   Cardiovascular: Negative for chest pain, palpitations and leg swelling.  Gastrointestinal: Positive for nausea, vomiting, abdominal pain and diarrhea. Negative for blood in stool.  Genitourinary: Negative for dysuria, flank pain, vaginal bleeding, vaginal discharge, vaginal pain and pelvic pain.  Musculoskeletal: Negative for arthralgias, myalgias, neck pain and neck stiffness.  Skin: Negative for rash.  Neurological: Negative for dizziness, weakness and headaches.  All other systems reviewed and are negative.    Allergies  Review of patient's allergies indicates no known allergies.  Home Medications   Current Outpatient Rx  Name  Route  Sig  Dispense  Refill  . docusate sodium (COLACE) 100 MG capsule   Oral   Take 1 capsule (100 mg total) by mouth every 12 (twelve) hours.   60 capsule   0   . ibuprofen (ADVIL,MOTRIN) 200 MG tablet   Oral   Take 800 mg by mouth every 6 (six) hours as needed for pain.          . naproxen (NAPROSYN) 500 MG tablet   Oral   Take 1 tablet (500 mg total) by mouth 2 (two) times daily.   30 tablet   0   . terbinafine (LAMISIL) 1 % cream   Topical   Apply 1 application topically 2 (two) times daily.   30 g  0   . terbinafine (LAMISIL) 250 MG tablet   Oral   Take 1 tablet (250 mg total) by mouth daily.   14 tablet   0    BP 106/54  Temp(Src) 98.1 F (36.7 C) (Oral)  Resp 15  Ht 6' (1.829 m)  Wt 313 lb 3 oz (142.061 kg)  BMI 42.47 kg/m2  SpO2 96%  LMP 05/15/2013 Physical Exam  Nursing note and vitals reviewed. Constitutional: She appears well-developed and well-nourished. No distress.  HENT:  Head: Normocephalic.  Eyes: Conjunctivae are normal.  Neck: Neck supple.  Cardiovascular: Normal rate, regular rhythm and normal heart sounds.   Pulmonary/Chest: Effort normal and breath sounds normal. No  respiratory distress. She has no wheezes. She has no rales.  Abdominal: Soft. Bowel sounds are normal. She exhibits no distension. There is tenderness. There is no rebound.  Epigastric tenderness  Musculoskeletal: She exhibits no edema.  Neurological: She is alert.  Skin: Skin is warm and dry.  Psychiatric: She has a normal mood and affect. Her behavior is normal.    ED Course  Procedures (including critical care time) Labs Review Labs Reviewed  CBC WITH DIFFERENTIAL - Abnormal; Notable for the following:    HCT 35.2 (*)    All other components within normal limits  COMPREHENSIVE METABOLIC PANEL - Abnormal; Notable for the following:    Calcium 8.3 (*)    Albumin 3.4 (*)    Alkaline Phosphatase 37 (*)    Total Bilirubin 0.2 (*)    All other components within normal limits  URINE RAPID DRUG SCREEN (HOSP PERFORMED) - Abnormal; Notable for the following:    Tetrahydrocannabinol POSITIVE (*)    All other components within normal limits  LIPASE, BLOOD  URINALYSIS W MICROSCOPIC + REFLEX CULTURE  POCT PREGNANCY, URINE   Imaging Review No results found.  EKG Interpretation   None       MDM  No diagnosis found.  Pt with epigastric pain, nausea, vomiting, some diarrhea. Labs pending. Abdomen soft on exam. Will try gi cocktail and pepcid.   1:01 PM Pt feeling better after gi cocktail and pepcid. Receiving IV fluids. Labs pending. Urine preg negative.    1:43 PM Labs all back, no significant findings. Pt feeling better. No nausea or vomiting in ED. Pain free. Plan to d/c home and follow up with PCP. Start on pepcid, carafate. Stop ibuprofen. I suspect her symptoms may be due to gastritis and possible PUD from taking ibuprofen daily for 2 years and smoking. I explained this to pt. Will switch to tylenol for pain. Will give a dental referral. At time of discharge, abdomen is soft, mildly tender, no acute abdomen.   Filed Vitals:   06/12/13 1116 06/12/13 1200 06/12/13 1230  06/12/13 1300  BP: 106/54 108/66 118/59 129/113  Pulse:  44 44 46  Temp: 98.1 F (36.7 C)     TempSrc: Oral     Resp: 15 15 17 17   Height: 6' (1.829 m)     Weight: 313 lb 3 oz (142.061 kg)     SpO2: 96% 100% 99% 98%     Lottie Mussel, PA-C 06/12/13 1345

## 2013-06-12 NOTE — ED Notes (Signed)
Per ems pt from work- [facility] [facility]- had sudden onset of upper central abdominal pain that started around 5AM and has increasingly got worse. Pt has had nausea and vomiting x2 prior to EMS arrival. x1 episode of diarrhea with first episode of vomiting. Pt is 9 days late for menstrual period. HAS HAD 4mg  of zofran- pt sts nausea has subsided but c/o generalized pain. Pain 8/10.

## 2013-06-12 NOTE — ED Provider Notes (Signed)
Medical screening examination/treatment/procedure(s) were performed by non-physician practitioner and as supervising physician I was immediately available for consultation/collaboration.  EKG Interpretation    Date/Time:  Friday June 12 2013 11:14:37 EST Ventricular Rate:  47 PR Interval:  155 QRS Duration: 90 QT Interval:  463 QTC Calculation: 409 R Axis:   61 Text Interpretation:  Sinus bradycardia No old tracing to compare Confirmed by [facility]  MD, CHARLES [phone]) on 06/12/2013 11:57:23 AM              Leonette Most B. Bernette Mayers, MD 06/12/13 1352

## 2013-06-18 ENCOUNTER — Emergency Department (HOSPITAL_COMMUNITY)
Admission: EM | Admit: 2013-06-18 | Discharge: 2013-06-18 | Disposition: A | Discharge status: Home / Self Care | Payer: Self-pay | Attending: Emergency Medicine | Admitting: Emergency Medicine

## 2013-06-18 ENCOUNTER — Encounter (HOSPITAL_COMMUNITY): Payer: Self-pay | Admitting: Emergency Medicine

## 2013-06-18 DIAGNOSIS — F1010 Alcohol abuse, uncomplicated: Secondary | ICD-10-CM | POA: Insufficient documentation

## 2013-06-18 DIAGNOSIS — J45909 Unspecified asthma, uncomplicated: Secondary | ICD-10-CM | POA: Insufficient documentation

## 2013-06-18 DIAGNOSIS — F10929 Alcohol use, unspecified with intoxication, unspecified: Secondary | ICD-10-CM

## 2013-06-18 DIAGNOSIS — Z79899 Other long term (current) drug therapy: Secondary | ICD-10-CM | POA: Insufficient documentation

## 2013-06-18 DIAGNOSIS — F17200 Nicotine dependence, unspecified, uncomplicated: Secondary | ICD-10-CM | POA: Insufficient documentation

## 2013-06-18 MED ORDER — SODIUM CHLORIDE 0.9 % IV BOLUS (SEPSIS)
2000.0000 mL | Freq: Once | INTRAVENOUS | Status: AC
  Administered 2013-06-18: 2000 mL via INTRAVENOUS

## 2013-06-18 NOTE — ED Notes (Signed)
Arrives via EMS for ETOH intoxication PIV placed, Zofran 4mg  IV and NS bolus given en route by EMS staff

## 2013-06-18 NOTE — ED Provider Notes (Signed)
CSN: [id number]     Arrival date & time 06/18/13  0201 History   First MD Initiated Contact with Patient 06/18/13 0425     Chief Complaint  Patient presents with  . Alcohol Intoxication   (Consider location/radiation/quality/duration/timing/severity/associated sxs/prior Treatment) HPI Comments: Patient is a 22 year old female with history of asthma who presents to the emergency department for alcohol intoxication. Her partner called EMS because she felt like she drinks too much. When EMS arrived the patient was responsive, but intoxicated. On my exam the patient responds to verbal stimuli. She is able to appropriately answer questions. She is unsure how much she drinks tonight, but states she drank a lot of "white liquor". She told the nurse earlier she drank vodka. She is oriented to person, place, time. She denies any other drug use. She does not drink daily.  The history is provided by the patient. No language interpreter was used.    Past Medical History  Diagnosis Date  . Asthma    History reviewed. No pertinent past surgical history. No family history on file. History  Substance Use Topics  . Smoking status: Current Some Day Smoker -- 0.50 packs/day    Types: Cigarettes  . Smokeless tobacco: Not on file  . Alcohol Use: Yes   OB History   Grav Para Term Preterm Abortions TAB SAB Ect Mult Living                 Review of Systems  Unable to perform ROS: Other    Allergies  Review of patient's allergies indicates no known allergies.  Home Medications   Current Outpatient Rx  Name  Route  Sig  Dispense  Refill  . docusate sodium (COLACE) 100 MG capsule   Oral   Take 1 capsule (100 mg total) by mouth every 12 (twelve) hours.   60 capsule   0   . famotidine (PEPCID) 20 MG tablet   Oral   Take 1 tablet (20 mg total) by mouth 2 (two) times daily.   30 tablet   0   . ibuprofen (ADVIL,MOTRIN) 200 MG tablet   Oral   Take 800 mg by mouth every 6 (six) hours as needed  for pain.          . promethazine (PHENERGAN) 25 MG tablet   Oral   Take 1 tablet (25 mg total) by mouth every 6 (six) hours as needed for nausea or vomiting.   15 tablet   0   . sucralfate (CARAFATE) 1 G tablet   Oral   Take 1 tablet (1 g total) by mouth 4 (four) times daily -  with meals and at bedtime.   30 tablet   0   . terbinafine (LAMISIL) 1 % cream   Topical   Apply 1 application topically 2 (two) times daily.   30 g   0   . EXPIRED: promethazine (PHENERGAN) 25 MG tablet   Oral   Take 0.5 tablets (12.5 mg total) by mouth every 6 (six) hours as needed for nausea.   30 tablet   0    BP 109/74  Pulse 56  Temp(Src) 97.4 F (36.3 C) (Oral)  Resp 16  SpO2 99% Physical Exam  Nursing note and vitals reviewed. Constitutional: She is oriented to person, place, and time. She appears well-developed and well-nourished. No distress.  Patient sleeping comfortably on bed  HENT:  Head: Normocephalic and atraumatic.  Right Ear: External ear normal.  Left Ear: External ear normal.  Nose: Nose normal.  Mouth/Throat: Oropharynx is clear and moist.  Eyes: Conjunctivae are normal.  Neck: Normal range of motion.  Cardiovascular: Normal rate, regular rhythm and normal heart sounds.   Pulmonary/Chest: Effort normal and breath sounds normal. No stridor. No respiratory distress. She has no wheezes. She has no rales.  Abdominal: Soft. She exhibits no distension.  Musculoskeletal: Normal range of motion.  Neurological: She is alert and oriented to person, place, and time. She has normal strength.  Responds easily to verbal stimuli  Skin: Skin is warm and dry. She is not diaphoretic. No erythema.  Psychiatric: She has a normal mood and affect. Her behavior is normal.    ED Course  Procedures (including critical care time) Labs Review Labs Reviewed - No data to display Imaging Review No results found.  EKG Interpretation   None       MDM   1. Alcohol intoxication     Patient presents with alcohol intoxication. Pt was given fluid bolus and monitored. Pt was responsive to verbal stimuli on exam. No indication for labs. Pt reevaluted and was alert and oriented. Pt expressed remorse about the amount she drank. The patient had no complaints and feels ready for discharge. Pt not slurring words, steady gait, and will call a cab to home. Vital signs are stable for discharge. Patient / Family / Caregiver informed of clinical course, understand medical decision-making process, and agree with plan.      Mora Bellman, PA-C 06/18/13 [phone]

## 2013-06-18 NOTE — ED Notes (Signed)
Bed: MV78 Expected date:  Expected time:  Means of arrival:  Comments: EMS/etoh/IV and zofran

## 2013-06-19 NOTE — ED Provider Notes (Signed)
Medical screening examination/treatment/procedure(s) were performed by non-physician practitioner and as supervising physician I was immediately available for consultation/collaboration.   Lucus Lambertson, MD 06/19/13 0311 

## 2013-07-09 ENCOUNTER — Emergency Department (HOSPITAL_COMMUNITY): Payer: Self-pay

## 2013-07-09 ENCOUNTER — Encounter (HOSPITAL_COMMUNITY): Payer: Self-pay | Admitting: Emergency Medicine

## 2013-07-09 ENCOUNTER — Emergency Department (HOSPITAL_COMMUNITY)
Admission: EM | Admit: 2013-07-09 | Discharge: 2013-07-09 | Disposition: A | Discharge status: Home / Self Care | Payer: Self-pay | Attending: Emergency Medicine | Admitting: Emergency Medicine

## 2013-07-09 DIAGNOSIS — F17200 Nicotine dependence, unspecified, uncomplicated: Secondary | ICD-10-CM | POA: Insufficient documentation

## 2013-07-09 DIAGNOSIS — W108XXA Fall (on) (from) other stairs and steps, initial encounter: Secondary | ICD-10-CM | POA: Insufficient documentation

## 2013-07-09 DIAGNOSIS — Y929 Unspecified place or not applicable: Secondary | ICD-10-CM | POA: Insufficient documentation

## 2013-07-09 DIAGNOSIS — J45909 Unspecified asthma, uncomplicated: Secondary | ICD-10-CM | POA: Insufficient documentation

## 2013-07-09 DIAGNOSIS — S7012XA Contusion of left thigh, initial encounter: Secondary | ICD-10-CM

## 2013-07-09 DIAGNOSIS — S7010XA Contusion of unspecified thigh, initial encounter: Secondary | ICD-10-CM | POA: Insufficient documentation

## 2013-07-09 DIAGNOSIS — Y939 Activity, unspecified: Secondary | ICD-10-CM | POA: Insufficient documentation

## 2013-07-09 DIAGNOSIS — E669 Obesity, unspecified: Secondary | ICD-10-CM | POA: Insufficient documentation

## 2013-07-09 HISTORY — DX: Obesity, unspecified: E66.9

## 2013-07-09 MED ORDER — IBUPROFEN 400 MG PO TABS
800.0000 mg | ORAL_TABLET | Freq: Once | ORAL | Status: AC
  Administered 2013-07-09: 800 mg via ORAL
  Filled 2013-07-09: qty 2

## 2013-07-09 NOTE — Discharge Instructions (Signed)
Contusion  A contusion is a deep bruise. Contusions happen when an injury causes bleeding under the skin. Signs of bruising include pain, puffiness (swelling), and discolored skin. The contusion may turn blue, purple, or yellow.  HOME CARE   · Put ice on the injured area.  · Put ice in a plastic bag.  · Place a towel between your skin and the bag.  · Leave the ice on for 15-20 minutes, 03-04 times a day.  · Only take medicine as told by your doctor.  · Rest the injured area.  · If possible, raise (elevate) the injured area to lessen puffiness.  GET HELP RIGHT AWAY IF:   · You have more bruising or puffiness.  · You have pain that is getting worse.  · Your puffiness or pain is not helped by medicine.  MAKE SURE YOU:   · Understand these instructions.  · Will watch your condition.  · Will get help right away if you are not doing well or get worse.  Document Released: 11/28/2007 Document Revised: 09/03/2011 Document Reviewed: 04/16/2011  [employer]® Patient Information ©2014 [employer].

## 2013-07-09 NOTE — ED Provider Notes (Signed)
Medical screening examination/treatment/procedure(s) were performed by non-physician practitioner and as supervising physician I was immediately available for consultation/collaboration.  EKG Interpretation   None         Darlys Galesavid Bethanny Toelle, MD 07/09/13 [phone]

## 2013-07-09 NOTE — ED Notes (Signed)
Pt reports falling down steps this am, having pain to left thigh that radiates around to buttock. Pt is ambulatory.

## 2013-07-09 NOTE — ED Provider Notes (Signed)
CSN: [id number]     Arrival date & time 07/09/13  1152 History   First MD Initiated Contact with Patient 07/09/13 1203    This chart was scribed for Leslie Finner PA-C, a non-physician practitioner working with No att. providers found by Lewanda Rife, ED Scribe. This patient was seen in room TR05C/TR05C and the patient's care was started at 3:40 PM     Chief Complaint  Patient presents with  . Fall   (Consider location/radiation/quality/duration/timing/severity/associated sxs/prior Treatment) The history is provided by the patient. No language interpreter was used.   HPI Comments: Leslie Yates is a 23 y.o. female who presents to the Emergency Department complaining of constant moderate left thigh pain radiating to buttock onset this morning after a mechanical fall causing her to trip down a few steps and landing left anterior-lateral side. Reports associated left leg swelling. Denies any aggravating or alleviating factors. Denies associated fever, back pain, head injury, and other injuries.  Past Medical History  Diagnosis Date  . Asthma   . Obesity    History reviewed. No pertinent past surgical history. History reviewed. No pertinent family history. History  Substance Use Topics  . Smoking status: Current Some Day Smoker -- 0.50 packs/day    Types: Cigarettes  . Smokeless tobacco: Not on file  . Alcohol Use: Yes   OB History   Grav Para Term Preterm Abortions TAB SAB Ect Mult Living                 Review of Systems  Musculoskeletal: Positive for myalgias.  Psychiatric/Behavioral: Negative for confusion.    Allergies  Review of patient's allergies indicates no known allergies.  Home Medications   Current Outpatient Rx  Name  Route  Sig  Dispense  Refill  . HYDROcodone-acetaminophen (NORCO/VICODIN) 5-325 MG per tablet   Oral   Take 1 tablet by mouth every 6 (six) hours as needed for moderate pain.         Marland Kitchen ibuprofen (ADVIL,MOTRIN) 200 MG tablet   Oral  Take 400 mg by mouth once as needed for moderate pain.         Marland Kitchen EXPIRED: promethazine (PHENERGAN) 25 MG tablet   Oral   Take 0.5 tablets (12.5 mg total) by mouth every 6 (six) hours as needed for nausea.   30 tablet   0    BP 123/81  Pulse 92  Temp(Src) 97.7 F (36.5 C) (Oral)  Resp 20  SpO2 97%  LMP 06/03/2013 Physical Exam  Nursing note and vitals reviewed. Constitutional: She is oriented to person, place, and time. She appears well-developed and well-nourished.  Obese   HENT:  Head: Normocephalic and atraumatic.  Eyes: EOM are normal.  Neck: Normal range of motion.  Cardiovascular: Normal rate.   Pulmonary/Chest: Effort normal.  Musculoskeletal: Normal range of motion. She exhibits edema.       Left upper leg: She exhibits tenderness (focal to area demonstrated on diagram ) and edema. She exhibits no bony tenderness and no deformity.       Legs: Mild edema of anterolateral upper thigh.  Normal ROM of bilateral hips and knees. No crepitus, erythema.  Neurological: She is alert and oriented to person, place, and time.  Skin: Skin is warm and dry.  Psychiatric: She has a normal mood and affect. Her behavior is normal.    ED Course  Procedures   COORDINATION OF CARE:  Nursing notes reviewed. Vital signs reviewed. Initial pt interview and examination performed.  1:40 PM-Discussed work up plan with pt at bedside, which includes x-ray of left femur. Pt agrees with plan.  1:49 PM Nursing Notes Reviewed/ Care Coordinated Applicable Imaging Reviewed and incorporated into ED treatment Discussed results and treatment plan with pt. Pt demonstrates understanding and agrees with plan.  Treatment plan initiated: Medications  ibuprofen (ADVIL,MOTRIN) tablet 800 mg (800 mg Oral Given 07/09/13 1407)     Initial diagnostic testing ordered.    Labs Review Labs Reviewed - No data to display Imaging Review Dg Femur Left  07/09/2013   CLINICAL DATA:  Left femur pain after  fall.  EXAM: LEFT FEMUR - 2 VIEW  COMPARISON:  Leslie Yates.  FINDINGS: There is no evidence of fracture or other focal bone lesions. Soft tissues are unremarkable.  IMPRESSION: Normal left femur.   Electronically Signed   By: Roque LiasJames  Green M.D.   On: 07/09/2013 13:41    EKG Interpretation   Leslie Yates       MDM   1. Fall down stairs   2. Contusion of left anterior thigh    Pt c/o left anterior-lateral thigh pain that started suddenly after falling down steps this morning. Denies hitting head or LOC.  Pt c/o swelling as well. On exam, mild edema with superficial abrasion on left anterior thigh. No deformity or bruising. No active bleeding. Plain films: Normal.  Pain and mild edema likely due to soft tissue contusion. Discussed use of cool compresses today and tomorrow. May expect large bruise to develop over the next few days. May switch to heating pad to help alleviate pain and duration of bruise.  May take acetaminophen and ibuprofen as needed for pain. Advised to f/u with PCP.  I personally performed the services described in this documentation, which was scribed in my presence. The recorded information has been reviewed and is accurate.    Leslie Finnerrin O'Malley, PA-C 07/09/13 1544

## 2013-08-22 ENCOUNTER — Encounter (HOSPITAL_COMMUNITY): Payer: Self-pay | Admitting: Emergency Medicine

## 2013-08-22 ENCOUNTER — Emergency Department (HOSPITAL_COMMUNITY)
Admission: EM | Admit: 2013-08-22 | Discharge: 2013-08-22 | Disposition: A | Discharge status: Home / Self Care | Payer: Self-pay | Attending: Emergency Medicine | Admitting: Emergency Medicine

## 2013-08-22 DIAGNOSIS — R21 Rash and other nonspecific skin eruption: Secondary | ICD-10-CM | POA: Insufficient documentation

## 2013-08-22 DIAGNOSIS — E669 Obesity, unspecified: Secondary | ICD-10-CM | POA: Insufficient documentation

## 2013-08-22 DIAGNOSIS — J45909 Unspecified asthma, uncomplicated: Secondary | ICD-10-CM | POA: Insufficient documentation

## 2013-08-22 DIAGNOSIS — F17200 Nicotine dependence, unspecified, uncomplicated: Secondary | ICD-10-CM | POA: Insufficient documentation

## 2013-08-22 MED ORDER — PREDNISONE (PAK) 10 MG PO TABS: ORAL_TABLET | Freq: Every day | ORAL | Status: DC

## 2013-08-22 MED ORDER — DIPHENHYDRAMINE HCL 25 MG PO TABS: 25.0000 mg | ORAL_TABLET | Freq: Four times a day (QID) | ORAL | Status: DC

## 2013-08-22 NOTE — ED Provider Notes (Signed)
Medical screening examination/treatment/procedure(s) were performed by non-physician practitioner and as supervising physician I was immediately available for consultation/collaboration.   EKG Interpretation None        Joseguadalupe Stan M Jorge Amparo, MD 08/22/13 1636 

## 2013-08-22 NOTE — ED Provider Notes (Signed)
CSN: [id number]     Arrival date & time 08/22/13  1329 History  This chart was scribed for non-physician practitioner, Trixie Dredge, PA-C working with Lyanne Co, MD by Greggory Stallion, ED scribe. This patient was seen in room TR07C/TR07C and the patient's care was started at 2:56 PM.   Chief Complaint  Patient presents with  . Rash   The history is provided by the patient. No language interpreter was used.   HPI Comments: Leslie Yates is a 23 y.o. female who presents to the Emergency Department complaining of diffuse, itchy rash that started one month ago. She was evaluated for a rash under her breasts about one month ago and was told it was a fungal infection.  States this seems different.   She now has rash on  her arms, hands, buttocks and legs. Pt states the rash peels but denies any drainage from it. She has used Vaseline with no relief. Denies new clothes, detergents or soaps. She denies fever. Pt has had scabies in the past but states this is not similar.   Past Medical History  Diagnosis Date  . Asthma   . Obesity    History reviewed. No pertinent past surgical history. No family history on file. History  Substance Use Topics  . Smoking status: Current Some Day Smoker -- 0.50 packs/day    Types: Cigarettes  . Smokeless tobacco: Not on file  . Alcohol Use: Yes   OB History   Grav Para Term Preterm Abortions TAB SAB Ect Mult Living                 Review of Systems  Constitutional: Negative for fever.  Skin: Positive for rash.  All other systems reviewed and are negative.   Allergies  Review of patient's allergies indicates no known allergies.  Home Medications   Current Outpatient Rx  Name  Route  Sig  Dispense  Refill  . HYDROcodone-acetaminophen (NORCO/VICODIN) 5-325 MG per tablet   Oral   Take 1 tablet by mouth every 6 (six) hours as needed for moderate pain.         Marland Kitchen ibuprofen (ADVIL,MOTRIN) 200 MG tablet   Oral   Take 400 mg by mouth once as needed  for moderate pain.         Marland Kitchen EXPIRED: promethazine (PHENERGAN) 25 MG tablet   Oral   Take 0.5 tablets (12.5 mg total) by mouth every 6 (six) hours as needed for nausea.   30 tablet   0    BP 124/81  Pulse 72  Temp(Src) 98.3 F (36.8 C)  SpO2 99%  Physical Exam  Nursing note and vitals reviewed. Constitutional: She appears well-developed and well-nourished. No distress.  HENT:  Head: Normocephalic and atraumatic.  Neck: Neck supple.  Pulmonary/Chest: Effort normal.  Neurological: She is alert.  Skin: She is not diaphoretic.  Scattered tiny erythematous papules on bilateral forearms. Scabbing, healing lesions mostly on forearms, right buttock and a few scattered on lower legs.   No erythema, edema, warmth, or discharge.  Lesions are nontender. No burrows noted.     ED Course  Procedures (including critical care time)  DIAGNOSTIC STUDIES: Oxygen Saturation is 99% on RA, normal by my interpretation.    COORDINATION OF CARE: 3:02 PM-Discussed treatment plan which includes benadryl and prednisone with pt at bedside and pt agreed to plan.   Labs Review Labs Reviewed - No data to display Imaging Review No results found.   EKG Interpretation None  Filed Vitals:   08/22/13 1513  BP: 125/80  Pulse: 70  Temp:   Resp: 18     MDM   Final diagnoses:  Rash    Pt with pruritic rash, mostly scabbed and healing from patient scratching this.  Bilateral forearms with scattered erythematous papules that are likely the rash prior to scratching.  Afebrile, nontoxic. Does not appear to be scabies.  No known etiology for rash.  Likely contact dermatitis.  No e/o superinfection. Discussed findings, treatment, and follow up  with patient.  Pt given return precautions.  Pt verbalizes understanding and agrees with plan.      I personally performed the services described in this documentation, which was scribed in my presence. The recorded information has been reviewed and is  accurate.   Trixie Dredgemily Trinitey Roache, PA-C 08/22/13 1626

## 2013-08-22 NOTE — Discharge Instructions (Signed)
Read the information below.  Use the prescribed medication as directed.  Please discuss all new medications with your pharmacist.  You may return to the Emergency Department at any time for worsening condition or any new symptoms that concern you.   If you develop redness, swelling, pus draining from the wound, or fevers greater than 100.4, return to the ER immediately for a recheck.     Contact Dermatitis Contact dermatitis is a reaction to certain substances that touch the skin. Contact dermatitis can be either irritant contact dermatitis or allergic contact dermatitis. Irritant contact dermatitis does not require previous exposure to the substance for a reaction to occur.Allergic contact dermatitis only occurs if you have been exposed to the substance before. Upon a repeat exposure, your body reacts to the substance.  CAUSES  Many substances can cause contact dermatitis. Irritant dermatitis is most commonly caused by repeated exposure to mildly irritating substances, such as:  Makeup.  Soaps.  Detergents.  Bleaches.  Acids.  Metal salts, such as nickel. Allergic contact dermatitis is most commonly caused by exposure to:  Poisonous plants.  Chemicals (deodorants, shampoos).  Jewelry.  Latex.  Neomycin in triple antibiotic cream.  Preservatives in products, including clothing. SYMPTOMS  The area of skin that is exposed may develop:  Dryness or flaking.  Redness.  Cracks.  Itching.  Pain or a burning sensation.  Blisters. With allergic contact dermatitis, there may also be swelling in areas such as the eyelids, mouth, or genitals.  DIAGNOSIS  Your caregiver can usually tell what the problem is by doing a physical exam. In cases where the cause is uncertain and an allergic contact dermatitis is suspected, a patch skin test may be performed to help determine the cause of your dermatitis. TREATMENT Treatment includes protecting the skin from further contact with the  irritating substance by avoiding that substance if possible. Barrier creams, powders, and gloves may be helpful. Your caregiver may also recommend:  Steroid creams or ointments applied 2 times daily. For best results, soak the rash area in cool water for 20 minutes. Then apply the medicine. Cover the area with a plastic wrap. You can store the steroid cream in the refrigerator for a "chilly" effect on your rash. That may decrease itching. Oral steroid medicines may be needed in more severe cases.  Antibiotics or antibacterial ointments if a skin infection is present.  Antihistamine lotion or an antihistamine taken by mouth to ease itching.  Lubricants to keep moisture in your skin.  Burow's solution to reduce redness and soreness or to dry a weeping rash. Mix one packet or tablet of solution in 2 cups cool water. Dip a clean washcloth in the mixture, wring it out a bit, and put it on the affected area. Leave the cloth in place for 30 minutes. Do this as often as possible throughout the day.  Taking several cornstarch or baking soda baths daily if the area is too large to cover with a washcloth. Harsh chemicals, such as alkalis or acids, can cause skin damage that is like a burn. You should flush your skin for 15 to 20 minutes with cold water after such an exposure. You should also seek immediate medical care after exposure. Bandages (dressings), antibiotics, and pain medicine may be needed for severely irritated skin.  HOME CARE INSTRUCTIONS  Avoid the substance that caused your reaction.  Keep the area of skin that is affected away from hot water, soap, sunlight, chemicals, acidic substances, or anything else that would  irritate your skin.  Do not scratch the rash. Scratching may cause the rash to become infected.  You may take cool baths to help stop the itching.  Only take over-the-counter or prescription medicines as directed by your caregiver.  See your caregiver for follow-up care as  directed to make sure your skin is healing properly. SEEK MEDICAL CARE IF:   Your condition is not better after 3 days of treatment.  You seem to be getting worse.  You see signs of infection such as swelling, tenderness, redness, soreness, or warmth in the affected area.  You have any problems related to your medicines. Document Released: 06/08/2000 Document Revised: 09/03/2011 Document Reviewed: 11/14/2010 [facility] Patient Information 2014 [place], [place].  Allergies  Allergies may happen from anything your body is sensitive to. This may be food, medicines, pollens, chemicals, and many other things. Food allergies can be severe and deadly.  HOME CARE  If you do not know what causes a reaction, keep a diary. Write down the foods you ate and the symptoms that followed. Avoid foods that cause reactions.  If you have red raised spots (hives) or a rash:  Take medicine as told by your doctor.  Use medicines for red raised spots and itching as needed.  Apply cold cloths (compresses) to the skin. Take a cool bath. Avoid hot baths or showers.  If you are severely allergic:  It is often necessary to go to the hospital after you have treated your reaction.  Wear your medical alert jewelry.  You and your family must learn how to give a allergy shot or use an allergy kit (anaphylaxis kit).  Always carry your allergy kit or shot with you. Use this medicine as told by your doctor if a severe reaction is occurring. GET HELP RIGHT AWAY IF:  You have trouble breathing or are making high-pitched whistling sounds (wheezing).  You have a tight feeling in your chest or throat.  You have a puffy (swollen) mouth.  You have red raised spots, puffiness (swelling), or itching all over your body.  You have had a severe reaction that was helped by your allergy kit or shot. The reaction can return once the medicine has worn off.  You think you are having a food allergy. Symptoms most often happen  within 30 minutes of eating a food.  Your symptoms have not gone away within 2 days or are getting worse.  You have new symptoms.  You want to retest yourself with a food or drink you think causes an allergic reaction. Only do this under the care of a doctor. MAKE SURE YOU:   Understand these instructions.  Will watch your condition.  Will get help right away if you are not doing well or get worse. Document Released: 10/06/2012 Document Reviewed: 10/06/2012 [facility] Patient Information 2014 [place], [place].   Emergency Department Resource Guide 1) Find a Doctor and Pay Out of Pocket Although you won't have to find out who is covered by your insurance plan, it is a good idea to ask around and get recommendations. You will then need to call the office and see if the doctor you have chosen will accept you as a new patient and what types of options they offer for patients who are self-pay. Some doctors offer discounts or will set up payment plans for their patients who do not have insurance, but you will need to ask so you aren't surprised when you get to your appointment.  2) Contact Your Local  Health Department Not all health departments have doctors that can see patients for sick visits, but many do, so it is worth a call to see if yours does. If you don't know where your local health department is, you can check in your phone book. The CDC also has a tool to help you locate your state's health department, and many state websites also have listings of all of their local health departments.  3) Find a [facility] If your illness is not likely to be very severe or complicated, you may want to try a walk in clinic. These are popping up all over the country in pharmacies, drugstores, and shopping centers. They're usually staffed by nurse practitioners or physician assistants that have been trained to treat common illnesses and complaints. They're usually fairly quick and inexpensive. However,  if you have serious medical issues or chronic medical problems, these are probably not your best option.  No Primary Care Doctor: - Call Health Connect at  [phone] - they can help you locate a primary care doctor that  accepts your insurance, provides certain services, etc. - Physician Referral Service- [phone]  Chronic Pain Problems: Organization         Address  Phone   Notes  [facility]  [phone] Patients need to be referred by their primary care doctor.   Medication Assistance: Organization         Address  Phone   Notes  [facility] Medication [facility] [address] [place], [place] [address] [phone] --Must be a resident of [facility] -- Must have NO insurance coverage whatsoever (no Medicaid/ Medicare, etc.) -- The pt. MUST have a primary care doctor that directs their care regularly and follows them in the community   MedAssist  [phone]   [employer]  [phone]    Agencies that provide inexpensive medical care: Organization         Address  Phone   Notes  [facility]  [phone]   [facility]    [phone]   [facility] [place], [place] [address] [phone]   [place] [address] [place] [phone]   Planned Parenthood    [phone]   [facility]    [phone]   [place] and [place] [address], [place] Phone:  [phone], Fax:  [phone] Hours of Operation:  9 am - 6 pm, M-F.  Also accepts Medicaid/Medicare and self-pay.  [facility] for [place], [address], [place] Phone: [phone], Fax: [phone]. Hours of Operation:  8:30 am - 5:30 pm, M-F.  Also accepts Medicaid and self-pay.  [facility] [place] [address], [place] Phone: [phone]   [place], [place], [place] [phone], Ext. [phone] Mondays & Thursdays: 7-9 AM.  First 15 patients are seen on a first come, first serve basis.    [place] Providers:  Organization         Address  Phone   Notes  [facility] [address], [place] [phone] Also accepts self-pay patients.  [place], [place]  [phone]   [place], [place], [place] [phone]  [place] [address], [place] [phone]   Lucianne Lei [address], [place]   [phone] Only accepts [place] Access [place] patients after they have their name applied to their card.   Self-Pay (no insurance) in [facility]:  Organization         Address  Phone   Notes  Sickle Cell Patients, [facility] Internal Medicine [place] [phone]   [facility] Urgent Care [place] [phone]   [facility] [place]  [place], [place], [place] [phone]   [facility]/Dr. Osei-Bonsu  [address], [place] or [address], [place] [phone] Phone number for both [place] and [place] locations is the same.  Urgent Medical and [facility] [address], [place] [phone]   [facility] [address], [place] or [address] [phone]   [facility] [address] [place] [phone], phone; [phone], fax Sees patients 1st and 3rd Saturday of every month.  Must not qualify for public or private insurance (i.e. Medicaid, Medicare, [place] Health Choice, Veterans' Benefits)  Household income should be no more than 200% of the poverty level The clinic cannot treat you if you are pregnant or think you are  pregnant  Sexually transmitted diseases are not treated at the clinic.    Dental Care: Organization         Address  Phone  Notes  [facility] Department of [facility] [place] [phone] Accepts children up to age 74 who are enrolled in [place] or [place]; pregnant women with a Medicaid card; and children who have applied for Medicaid or [place] Health Choice, but were declined, whose parents can pay a reduced fee at time of service.  [facility] Department of [facility]  [address], [place] [phone] Accepts children up to age 25 who are enrolled in [place] or [place]; pregnant women with a Medicaid card; and children who have applied for Medicaid or  Health Choice, but were declined, whose parents can pay a reduced fee at time of service.  [place] Adult Dental Access PROGRAM  [place] [phone] Patients are seen by appointment only. Walk-ins are not accepted. [place] will see patients 6 years of age and older. Monday - Tuesday (8am-5pm) Most Wednesdays (8:30-5pm) $30 per visit, cash only  [facility] Adult Dental Access PROGRAM  [address], [facility] [phone] Patients are seen by appointment only. Walk-ins are not accepted. [place] will see patients 26 years of age and older. One Wednesday Evening (Monthly: Volunteer Based).  $30 per visit, cash only  [place]  [phone] for adults; Children under age 62, call Graduate Pediatric Dentistry at [phone]. Children aged 53-14, please call [phone] to request a pediatric application.  Dental services are provided in all areas of dental care including fillings, crowns and bridges, complete and partial dentures, implants, gum treatment, root canals, and extractions. Preventive care is also provided. Treatment is provided to both adults and  children. Patients are selected via a lottery and there is often a waiting list.   [facility] [address] [place]  [phone] [email or web address]  [facility] [address] [place], [place] [phone], Ext. [phone] Second and Fourth Thursday of each month, opens at 6:30 AM; Clinic ends at 9 AM.  Patients are seen on a first-come first-served basis, and a limited number are seen during each clinic.   [facility]  [address] Hillard Danker [place], [place] [phone]   Eligibility Requirements You must have lived in [place], [place], or [place] counties for at least the last three months.   You cannot be eligible for state or federal sponsored [employer], including [employer], [place], or [employer].   You generally cannot be eligible for healthcare insurance through your employer.    How to apply: Eligibility screenings are held every Tuesday and Wednesday afternoon from 1:00 pm until 4:00 pm. You do not need an appointment for the interview!  [facility] [address] [place], [place]   [place]  [place] Department  [place]  [phone]    Behavioral Health Resources in the Community: Intensive Outpatient Programs Organization         Address  Phone  Notes  [place]. [address] [place], [place] [phone]   [facility] Outpatient [address] [place], [place]   ADS: Alcohol & Drug Svcs [address] [place], [place]   [place] [address]  [place], [place] or [phone]   Substance Abuse Resources Organization         Address  Phone  Notes  Alcohol and Drug Services  [phone]   [place]  [phone]   [facility] [place]    Chinita Pester  [phone]   Residential & Outpatient Substance Abuse Program  [phone]   Psychological Services Organization         Address  Phone  Notes  [facility] [place]  [place]  [phone]   [place] [address], [place] or [phone]    Mobile Crisis Teams Organization         Address  Phone  Notes  [facility], Mobile Crisis Care Unit  [phone]   Assertive Psychotherapeutic Services  [address] [place], [place]   [place] [address] [place] [phone]    Self-Help/Support Groups Organization         Address  Phone             Notes  [place]. of [place] - variety of support groups  [place] Call for more information  Narcotics Anonymous (NA), [facility] [address], [employer] [place]  2 meetings at this location   Special educational needs teacher         Address  Phone  Notes  ASAP Residential Treatment [place],    [place]  [phone]   [facility]  [address], [place] [phone], [place], [place]   [place], [place] [phone] Admissions: 8am-3pm M-F  Incentives Substance [place] [address]    [place], [place] [phone]   [facility] [address] Jadene Pierini [place], [place]   The [place].,  [place], [place] - Intensive Outpatient [address]  [address] [place], [place]   [facility] ([place].) [place].,  [place], [place] [phone] or [phone]   [facility]idential Treatment Services (RTS) [address], [place], [place] Accepts Medicaid  [facility] [place] [address],  [place] [phone] Substance Abuse/Addiction Treatment   [facility] Organization         Address  Phone  Notes  [facility]  [phone]   Domenic Schwab, PhD [address] Arlis [place], [place]   [phone] or [phone]   [place]   [address] [place], [place] [phone]   [address], [place], [place] [phone] Insurance/Medicaid/sponsorship through [facility] and Families [address]                                    [place], [place] [phone] [place] [address], [place] [phone]    Dr. Adele Schilder  [phone]   [facility] [place] Dept. 1) [address], [place] 2) [place] 3)  [place] [address], [place] [phone]  [phone]   [place] [phone] or [phone] (After Hours)

## 2013-08-22 NOTE — ED Notes (Signed)
Pt presents to department for evaluation of rash all over body. Ongoing x1 month. No relief with OTC creams. Pt states rash is itchy and uncomfortable. Respirations unlabored. Pt is alert and oriented x4. No signs of acute distress noted.

## 2013-10-19 ENCOUNTER — Encounter (HOSPITAL_COMMUNITY): Payer: Self-pay | Admitting: Emergency Medicine

## 2013-10-19 ENCOUNTER — Emergency Department (INDEPENDENT_AMBULATORY_CARE_PROVIDER_SITE_OTHER)
Admission: EM | Admit: 2013-10-19 | Discharge: 2013-10-19 | Disposition: A | Discharge status: Home / Self Care | Payer: Self-pay | Source: Home / Self Care

## 2013-10-19 DIAGNOSIS — K2970 Gastritis, unspecified, without bleeding: Secondary | ICD-10-CM

## 2013-10-19 DIAGNOSIS — K2990 Gastroduodenitis, unspecified, without bleeding: Secondary | ICD-10-CM

## 2013-10-19 LAB — POCT I-STAT, CHEM 8
BUN: 8 mg/dL (ref 6–23)
CHLORIDE: 107 meq/L (ref 96–112)
CREATININE: 0.8 mg/dL (ref 0.50–1.10)
Calcium, Ion: 1.17 mmol/L (ref 1.12–1.23)
Glucose, Bld: 115 mg/dL — ABNORMAL HIGH (ref 70–99)
HCT: 42 % (ref 36.0–46.0)
Hemoglobin: 14.3 g/dL (ref 12.0–15.0)
POTASSIUM: 3.6 meq/L — AB (ref 3.7–5.3)
Sodium: 142 mEq/L (ref 137–147)
TCO2: 25 mmol/L (ref 0–100)

## 2013-10-19 LAB — POCT URINALYSIS DIP (DEVICE)
BILIRUBIN URINE: NEGATIVE
Glucose, UA: NEGATIVE mg/dL
KETONES UR: NEGATIVE mg/dL
LEUKOCYTES UA: NEGATIVE
Nitrite: NEGATIVE
Protein, ur: NEGATIVE mg/dL
Specific Gravity, Urine: 1.025 (ref 1.005–1.030)
Urobilinogen, UA: 0.2 mg/dL (ref 0.0–1.0)
pH: 6 (ref 5.0–8.0)

## 2013-10-19 LAB — POCT H PYLORI SCREEN: H. PYLORI SCREEN, POC: NEGATIVE

## 2013-10-19 LAB — POCT PREGNANCY, URINE: Preg Test, Ur: NEGATIVE

## 2013-10-19 MED ORDER — FAMOTIDINE 20 MG PO TABS: 20.0000 mg | ORAL_TABLET | Freq: Two times a day (BID) | ORAL | Status: DC

## 2013-10-19 MED ORDER — ONDANSETRON HCL 4 MG PO TABS: 4.0000 mg | ORAL_TABLET | Freq: Four times a day (QID) | ORAL | Status: DC

## 2013-10-19 NOTE — ED Notes (Signed)
C/o  Waking this a.m with n/v.   4 vomiting episodes.  Diarrhea/loose stools every day.   No otc meds tried.   States under a lot of stress.  Working two jobs, "dont eat right or some times not at all".

## 2013-10-19 NOTE — ED Provider Notes (Signed)
CSN: [id number]     Arrival date & time 10/19/13  1617 History   None    Chief Complaint  Patient presents with  . Emesis    abdominal pain   (Consider location/radiation/quality/duration/timing/severity/associated sxs/prior Treatment)  HPI  The patient is a 23 year old female presenting today with complaints of nausea and vomiting times one year. The patient states that she has had intermittent episodes with nausea and vomiting and has been evaluated numerous times in the emergency department. Patient states she has no insurance or primary care provider and is concerned about this constant abdominal pain and discomfort  Past Medical History  Diagnosis Date  . Asthma   . Obesity    History reviewed. No pertinent past surgical history. History reviewed. No pertinent family history. History  Substance Use Topics  . Smoking status: Current Some Day Smoker -- 0.50 packs/day    Types: Cigarettes  . Smokeless tobacco: Not on file  . Alcohol Use: Yes   OB History   Grav Para Term Preterm Abortions TAB SAB Ect Mult Living                 Review of Systems  Constitutional: Negative.  Negative for fever, chills and fatigue.  HENT: Negative.   Eyes: Negative.   Respiratory: Negative for cough, chest tightness and shortness of breath.   Cardiovascular: Negative.  Negative for chest pain.  Gastrointestinal: Positive for nausea, vomiting and abdominal pain. Negative for diarrhea, constipation, blood in stool, abdominal distention, anal bleeding and rectal pain.  Endocrine: Negative.   Genitourinary: Negative.  Negative for dysuria, vaginal bleeding, vaginal discharge, genital sores and pelvic pain.  Musculoskeletal: Negative.   Skin: Negative.   Allergic/Immunologic: Negative.   Neurological: Negative.   Hematological: Negative.   Psychiatric/Behavioral: Negative.     Allergies  Review of patient's allergies indicates no known allergies.  Home Medications   Prior to Admission  medications   Medication Sig Start Date End Date Taking? Authorizing Provider  diphenhydrAMINE (BENADRYL) 25 MG tablet Take 1 tablet (25 mg total) by mouth every 6 (six) hours. X 3 days, then as needed 08/22/13   Trixie DredgeEmily West, PA-C  ibuprofen (ADVIL,MOTRIN) 200 MG tablet Take 400 mg by mouth every 8 (eight) hours as needed for moderate pain.     Historical Provider, MD  predniSONE (STERAPRED UNI-PAK) 10 MG tablet Take by mouth daily. Day 1: take 6 tabs.  Day 2: 5 tabs  Day 3: 4 tabs  Day 4: 3 tabs  Day 5: 2 tabs  Day 6: 1 tab 08/22/13   Trixie DredgeEmily West, PA-C   BP 111/79  Pulse 68  Temp(Src) 97.5 F (36.4 C) (Oral)  Resp 16  SpO2 100%  LMP 09/06/2013  Physical Exam  Nursing note and vitals reviewed. Constitutional: She is oriented to person, place, and time. She appears well-developed and well-nourished. No distress.  Cardiovascular: Normal rate, regular rhythm, normal heart sounds and intact distal pulses.  Exam reveals no gallop and no friction rub.   No murmur heard. Pulmonary/Chest: Effort normal and breath sounds normal. No respiratory distress. She has no wheezes. She has no rales. She exhibits no tenderness.  Abdominal: Soft. Bowel sounds are normal. She exhibits no distension and no mass. There is tenderness. There is no rebound and no guarding.  Negative Murphy's negative McBurney's. Patient reports tenderness and discomfort with mild palpation in left upper quadrant.  Neurological: She is alert and oriented to person, place, and time.  Skin: Skin is warm and  dry. No rash noted. She is not diaphoretic. No erythema. No pallor.    ED Course  Procedures (including critical care time) Labs Review Labs Reviewed  POCT I-STAT, CHEM 8 - Abnormal; Notable for the following:    Potassium 3.6 (*)    Glucose, Bld 115 (*)    All other components within normal limits  POCT URINALYSIS DIP (DEVICE) - Abnormal; Notable for the following:    Hgb urine dipstick SMALL (*)    All other components  within normal limits  I-STAT CHEM 8, ED  POCT H PYLORI SCREEN  POCT PREGNANCY, URINE   Patient states the trace amount of blood is related to ingrown hairs she has been around her pubic hair one of which was bleeding today.  H. Pylori negative.  No results found.   MDM   1. Gastritis    Meds ordered this encounter  Medications  . ondansetron (ZOFRAN) 4 MG tablet    Sig: Take 1 tablet (4 mg total) by mouth every 6 (six) hours.    Dispense:  12 tablet    Refill:  0  . famotidine (PEPCID) 20 MG tablet    Sig: Take 1 tablet (20 mg total) by mouth 2 (two) times daily.    Dispense:  60 tablet    Refill:  3   Patient advised to followup with health and wellness center.  The patient verbalizes understanding and agrees to plan of care.       Weber Cooksatherine Rossi, NP 10/19/13 2135

## 2013-10-19 NOTE — Discharge Instructions (Signed)

## 2013-10-20 NOTE — ED Provider Notes (Signed)
Medical screening examination/treatment/procedure(s) were performed by resident physician or non-physician practitioner and as supervising physician I was immediately available for consultation/collaboration.   KINDL,JAMES DOUGLAS MD.   James D Kindl, MD 10/20/13 0831 

## 2013-12-03 ENCOUNTER — Emergency Department (HOSPITAL_COMMUNITY)
Admission: EM | Admit: 2013-12-03 | Discharge: 2013-12-03 | Discharge status: Against Medical Advice | Payer: Self-pay | Attending: Emergency Medicine | Admitting: Emergency Medicine

## 2013-12-03 ENCOUNTER — Encounter (HOSPITAL_COMMUNITY): Payer: Self-pay | Admitting: Emergency Medicine

## 2013-12-03 DIAGNOSIS — F17200 Nicotine dependence, unspecified, uncomplicated: Secondary | ICD-10-CM | POA: Insufficient documentation

## 2013-12-03 DIAGNOSIS — R112 Nausea with vomiting, unspecified: Secondary | ICD-10-CM | POA: Insufficient documentation

## 2013-12-03 DIAGNOSIS — J45909 Unspecified asthma, uncomplicated: Secondary | ICD-10-CM | POA: Insufficient documentation

## 2013-12-03 DIAGNOSIS — Z3202 Encounter for pregnancy test, result negative: Secondary | ICD-10-CM | POA: Insufficient documentation

## 2013-12-03 DIAGNOSIS — R109 Unspecified abdominal pain: Secondary | ICD-10-CM

## 2013-12-03 DIAGNOSIS — E669 Obesity, unspecified: Secondary | ICD-10-CM | POA: Insufficient documentation

## 2013-12-03 DIAGNOSIS — Z79899 Other long term (current) drug therapy: Secondary | ICD-10-CM | POA: Insufficient documentation

## 2013-12-03 LAB — CBC WITH DIFFERENTIAL/PLATELET
Basophils Absolute: 0 10*3/uL (ref 0.0–0.1)
Basophils Relative: 0 % (ref 0–1)
Eosinophils Absolute: 0.1 10*3/uL (ref 0.0–0.7)
Eosinophils Relative: 1 % (ref 0–5)
HCT: 36.4 % (ref 36.0–46.0)
HEMOGLOBIN: 13 g/dL (ref 12.0–15.0)
LYMPHS ABS: 1.3 10*3/uL (ref 0.7–4.0)
LYMPHS PCT: 13 % (ref 12–46)
MCH: 30.2 pg (ref 26.0–34.0)
MCHC: 35.7 g/dL (ref 30.0–36.0)
MCV: 84.7 fL (ref 78.0–100.0)
Monocytes Absolute: 0.5 10*3/uL (ref 0.1–1.0)
Monocytes Relative: 5 % (ref 3–12)
NEUTROS PCT: 81 % — AB (ref 43–77)
Neutro Abs: 8.2 10*3/uL — ABNORMAL HIGH (ref 1.7–7.7)
Platelets: 194 10*3/uL (ref 150–400)
RBC: 4.3 MIL/uL (ref 3.87–5.11)
RDW: 12.5 % (ref 11.5–15.5)
WBC: 10.1 10*3/uL (ref 4.0–10.5)

## 2013-12-03 LAB — URINALYSIS, ROUTINE W REFLEX MICROSCOPIC
BILIRUBIN URINE: NEGATIVE
GLUCOSE, UA: NEGATIVE mg/dL
KETONES UR: NEGATIVE mg/dL
Leukocytes, UA: NEGATIVE
Nitrite: NEGATIVE
PH: 7.5 (ref 5.0–8.0)
PROTEIN: NEGATIVE mg/dL
Specific Gravity, Urine: 1.016 (ref 1.005–1.030)
Urobilinogen, UA: 1 mg/dL (ref 0.0–1.0)

## 2013-12-03 LAB — URINE MICROSCOPIC-ADD ON

## 2013-12-03 LAB — COMPREHENSIVE METABOLIC PANEL
ALK PHOS: 38 U/L — AB (ref 39–117)
ALT: 9 U/L (ref 0–35)
AST: 16 U/L (ref 0–37)
Albumin: 3.6 g/dL (ref 3.5–5.2)
BILIRUBIN TOTAL: 0.4 mg/dL (ref 0.3–1.2)
BUN: 11 mg/dL (ref 6–23)
CO2: 21 meq/L (ref 19–32)
Calcium: 9 mg/dL (ref 8.4–10.5)
Chloride: 105 mEq/L (ref 96–112)
Creatinine, Ser: 0.73 mg/dL (ref 0.50–1.10)
GLUCOSE: 100 mg/dL — AB (ref 70–99)
POTASSIUM: 3.5 meq/L — AB (ref 3.7–5.3)
Sodium: 140 mEq/L (ref 137–147)
Total Protein: 6.6 g/dL (ref 6.0–8.3)

## 2013-12-03 LAB — PREGNANCY, URINE: Preg Test, Ur: NEGATIVE

## 2013-12-03 LAB — I-STAT TROPONIN, ED: Troponin i, poc: 0 ng/mL (ref 0.00–0.08)

## 2013-12-03 LAB — LIPASE, BLOOD: LIPASE: 19 U/L (ref 11–59)

## 2013-12-03 MED ORDER — MORPHINE SULFATE 4 MG/ML IJ SOLN
4.0000 mg | Freq: Once | INTRAMUSCULAR | Status: AC
  Administered 2013-12-03: 4 mg via INTRAVENOUS
  Filled 2013-12-03: qty 1

## 2013-12-03 MED ORDER — ONDANSETRON HCL 4 MG/2ML IJ SOLN
4.0000 mg | Freq: Once | INTRAMUSCULAR | Status: AC
  Administered 2013-12-03: 4 mg via INTRAVENOUS
  Filled 2013-12-03: qty 2

## 2013-12-03 MED ORDER — SODIUM CHLORIDE 0.9 % IV BOLUS (SEPSIS)
1000.0000 mL | Freq: Once | INTRAVENOUS | Status: AC
  Administered 2013-12-03: 1000 mL via INTRAVENOUS

## 2013-12-03 NOTE — ED Notes (Signed)
Pt able to keep 500 cc fluid down without nausea or vomiting.

## 2013-12-03 NOTE — ED Notes (Signed)
Pt attempting fluid challenge with water at this time.

## 2013-12-03 NOTE — ED Notes (Signed)
Patient left AMA to catch bus. Was aware she was not up for discharge and needed to wait for MD and for discharge papers.

## 2013-12-03 NOTE — ED Notes (Signed)
Per EMS- standing and started to experience right sided abdominal pain. 7/10 pain. No precipitating factors. Immediately having nausea and has emesis x4. This is routine for patient. 10/10 pain upon movement. No radiation. Abdomen tender to palpation. No pulsating mass. Currently on menstrual cycle. Cycle is regular. Patient thinks there is no chance she could be pregnant. Ate at 1200 and pain started 4 hours later. VS: BP 118/76 HR 60 NSR 99% SpO2 on RA. Prescribed zofran and phenergan. Hx cervix cell abnormality. Was evaluated three months ago and has not been back for re-evaluation. Has been seen multiple times here and treated with same medications.

## 2013-12-03 NOTE — ED Notes (Signed)
Bed: WA06 Expected date:  Expected time:  Means of arrival:  Comments: EMS-abdominal pain, chronic

## 2013-12-03 NOTE — ED Provider Notes (Addendum)
CSN: [id number]     Arrival date & time 12/03/13  1757 History   First MD Initiated Contact with Patient 12/03/13 1809     Chief Complaint  Patient presents with  . Abdominal Pain  . Nausea     (Consider location/radiation/quality/duration/timing/severity/associated sxs/prior Treatment) HPI Pt presenting with c/o right sided flank pain.  She states she has also had 4-5 episodes of emesis- nonbloody and nonbilious.  No diarrhea.  No fever/chills.   She states she has had similar symptoms in the past.  Currently having menses.  Denies dysuria, no increased frequency or urgency of urination.  No vaginal discharge other than menses.  Pt states pain began earlier today and has been constant.  Worse after eating.  There are no other associated systemic symptoms, there are no other alleviating or modifying factors.   Past Medical History  Diagnosis Date  . Asthma   . Obesity    History reviewed. No pertinent past surgical history. History reviewed. No pertinent family history. History  Substance Use Topics  . Smoking status: Current Some Day Smoker -- 0.50 packs/day    Types: Cigarettes  . Smokeless tobacco: Not on file  . Alcohol Use: Yes   OB History   Grav Para Term Preterm Abortions TAB SAB Ect Mult Living                 Review of Systems ROS reviewed and all otherwise negative except for mentioned in HPI    Allergies  Review of patient's allergies indicates no known allergies.  Home Medications   Prior to Admission medications   Medication Sig Start Date End Date Taking? Authorizing Provider  aspirin-acetaminophen-caffeine (EXCEDRIN MIGRAINE) [phone] MG per tablet Take 1 tablet by mouth every 6 (six) hours as needed for headache or migraine.   Yes Historical Provider, MD  famotidine (PEPCID) 20 MG tablet Take 1 tablet (20 mg total) by mouth 2 (two) times daily. 10/19/13   Weber Cooks, NP  ondansetron (ZOFRAN) 4 MG tablet Take 1 tablet (4 mg total) by mouth every 6  (six) hours. 10/19/13   Weber Cooks, NP   BP 109/59  Pulse 64  Resp 18  SpO2 100%  LMP 12/03/2013 Vitals reivewed Physical Exam Physical Examination: General appearance - alert, well appearing, and in no distress Mental status - alert, oriented to person, place, and time Eyes - no conjunctival injection, no scleral icterus Mouth - mucous membranes moist, pharynx normal without lesions Chest - clear to auscultation, no wheezes, rales or rhonchi, symmetric air entry Heart - normal rate, regular rhythm, normal S1, S2, no murmurs, rubs, clicks or gallops Abdomen - soft, nontender, nondistended, no masses or organomegaly, nabs Back exam - no midline tenderness to palpation, some right sided CVA tenderness Extremities - peripheral pulses normal, no pedal edema, no clubbing or cyanosis Skin - normal coloration and turgor, no rashes  ED Course  Procedures (including critical care time) Labs Review Labs Reviewed  CBC WITH DIFFERENTIAL - Abnormal; Notable for the following:    Neutrophils Relative % 81 (*)    Neutro Abs 8.2 (*)    All other components within normal limits  COMPREHENSIVE METABOLIC PANEL - Abnormal; Notable for the following:    Potassium 3.5 (*)    Glucose, Bld 100 (*)    Alkaline Phosphatase 38 (*)    All other components within normal limits  URINALYSIS, ROUTINE W REFLEX MICROSCOPIC - Abnormal; Notable for the following:    APPearance CLOUDY (*)  Hgb urine dipstick LARGE (*)    All other components within normal limits  LIPASE, BLOOD  PREGNANCY, URINE  URINE MICROSCOPIC-ADD ON  I-STAT TROPOININ, ED    Imaging Review No results found.   EKG Interpretation   Date/Time:  Thursday December 03 2013 18:31:32 EDT Ventricular Rate:  48 PR Interval:  155 QRS Duration: 91 QT Interval:  478 QTC Calculation: 427 R Axis:   53 Text Interpretation:  Slow sinus arrhythmia No significant change since  last tracing Confirmed by [facility]  MD, Lavada Langsam [phone] on 12/03/2013  11:41:18  PM      MDM   Final diagnoses:  Flank pain  Nausea and vomiting    Pt presenting with right sided flank pain asssoicated with vomiting.  Vitals reassuring, no significant WBC, bacteria or RBC in urine to suggest pyelo or ureteral stone- although stone remains on differential.  Abdominal exam is benign, nontender.  Pt received IV fluids, pain medication and antiemetics.  She felt improved and tolerated po fluids. Per chart review, she has had prior episodes of gastritis type symptoms, she states the symptoms today are different from the gastritis.  Pt left prior to receiving discharge papers, telling stafff that she needed to catch the bus.    Ethelda ChickMartha K Linker, MD 12/03/13 2317  Ethelda ChickMartha K Linker, MD 12/03/13 [phone]

## 2013-12-05 ENCOUNTER — Encounter (HOSPITAL_COMMUNITY): Payer: Self-pay | Admitting: Emergency Medicine

## 2013-12-05 ENCOUNTER — Emergency Department (HOSPITAL_COMMUNITY)
Admission: EM | Admit: 2013-12-05 | Discharge: 2013-12-05 | Disposition: A | Discharge status: Home / Self Care | Payer: Self-pay | Attending: Emergency Medicine | Admitting: Emergency Medicine

## 2013-12-05 DIAGNOSIS — J45909 Unspecified asthma, uncomplicated: Secondary | ICD-10-CM | POA: Insufficient documentation

## 2013-12-05 DIAGNOSIS — E669 Obesity, unspecified: Secondary | ICD-10-CM | POA: Insufficient documentation

## 2013-12-05 DIAGNOSIS — Z3202 Encounter for pregnancy test, result negative: Secondary | ICD-10-CM | POA: Insufficient documentation

## 2013-12-05 DIAGNOSIS — R112 Nausea with vomiting, unspecified: Secondary | ICD-10-CM | POA: Insufficient documentation

## 2013-12-05 DIAGNOSIS — F17200 Nicotine dependence, unspecified, uncomplicated: Secondary | ICD-10-CM | POA: Insufficient documentation

## 2013-12-05 DIAGNOSIS — R197 Diarrhea, unspecified: Secondary | ICD-10-CM | POA: Insufficient documentation

## 2013-12-05 LAB — URINE MICROSCOPIC-ADD ON

## 2013-12-05 LAB — BASIC METABOLIC PANEL
BUN: 9 mg/dL (ref 6–23)
CHLORIDE: 107 meq/L (ref 96–112)
CO2: 23 mEq/L (ref 19–32)
CREATININE: 0.81 mg/dL (ref 0.50–1.10)
Calcium: 8.5 mg/dL (ref 8.4–10.5)
GFR calc Af Amer: >90 mL/min (ref >90)
GFR calc non Af Amer: >90 mL/min (ref >90)
GLUCOSE: 101 mg/dL — AB (ref 70–99)
Potassium: 3.7 mEq/L (ref 3.7–5.3)
Sodium: 144 mEq/L (ref 137–147)

## 2013-12-05 LAB — CBC WITH DIFFERENTIAL/PLATELET
BASOS ABS: 0 10*3/uL (ref 0.0–0.1)
Basophils Relative: 0 % (ref 0–1)
Eosinophils Absolute: 0.1 10*3/uL (ref 0.0–0.7)
Eosinophils Relative: 2 % (ref 0–5)
HEMATOCRIT: 36.8 % (ref 36.0–46.0)
HEMOGLOBIN: 12.5 g/dL (ref 12.0–15.0)
LYMPHS PCT: 20 % (ref 12–46)
Lymphs Abs: 1.5 10*3/uL (ref 0.7–4.0)
MCH: 29.3 pg (ref 26.0–34.0)
MCHC: 34 g/dL (ref 30.0–36.0)
MCV: 86.2 fL (ref 78.0–100.0)
MONO ABS: 0.3 10*3/uL (ref 0.1–1.0)
MONOS PCT: 4 % (ref 3–12)
Neutro Abs: 5.6 10*3/uL (ref 1.7–7.7)
Neutrophils Relative %: 74 % (ref 43–77)
Platelets: 192 10*3/uL (ref 150–400)
RBC: 4.27 MIL/uL (ref 3.87–5.11)
RDW: 12.6 % (ref 11.5–15.5)
WBC: 7.5 10*3/uL (ref 4.0–10.5)

## 2013-12-05 LAB — URINALYSIS, ROUTINE W REFLEX MICROSCOPIC
Bilirubin Urine: NEGATIVE
Glucose, UA: NEGATIVE mg/dL
KETONES UR: NEGATIVE mg/dL
LEUKOCYTES UA: NEGATIVE
Nitrite: NEGATIVE
PH: 7 (ref 5.0–8.0)
Protein, ur: NEGATIVE mg/dL
Specific Gravity, Urine: 1.009 (ref 1.005–1.030)
Urobilinogen, UA: 1 mg/dL (ref 0.0–1.0)

## 2013-12-05 LAB — POC URINE PREG, ED: Preg Test, Ur: NEGATIVE

## 2013-12-05 MED ORDER — ONDANSETRON 4 MG PO TBDP
8.0000 mg | ORAL_TABLET | Freq: Once | ORAL | Status: AC
  Administered 2013-12-05: 8 mg via ORAL
  Filled 2013-12-05: qty 2

## 2013-12-05 MED ORDER — ONDANSETRON 4 MG PO TBDP: 4.0000 mg | ORAL_TABLET | Freq: Three times a day (TID) | ORAL | Status: DC | PRN

## 2013-12-05 MED ORDER — PANTOPRAZOLE SODIUM 40 MG PO TBEC
40.0000 mg | DELAYED_RELEASE_TABLET | Freq: Every day | ORAL | Status: DC
  Administered 2013-12-05: 40 mg via ORAL
  Filled 2013-12-05: qty 1

## 2013-12-05 MED ORDER — OMEPRAZOLE 20 MG PO CPDR: DELAYED_RELEASE_CAPSULE | ORAL | Status: DC

## 2013-12-05 NOTE — ED Provider Notes (Signed)
CSN: [id number]     Arrival date & time 12/05/13  [id number] History   First MD Initiated Contact with Patient 12/05/13 1005     Chief Complaint  Patient presents with  . Nausea     (Consider location/radiation/quality/duration/timing/severity/associated sxs/prior Treatment) HPI Comments: Patient with h/o of episodic N/V and occasional diarrhea x 2-3 years -- presents with her usual symptoms. Patient has been seen in emergency department and by urgent care in the past and has been treated for gastroenteritis and gastritis. She's never had an EGD or GI workup. Patient [facility] days ago and had reassuring labs. Patient reports feeling better yesterday but sleeping most of the day. Patient woke up this morning with multiple episodes of vomiting and diarrhea. She denies fever, chest pain, shortness of breath. Patient had right-sided abdominal pain 2 days ago, but today reports no abdominal pain. No urinary symptoms such as dysuria or hematuria. No treatments prior to arrival. Onset of symptoms acute. Course is improving. Nothing makes symptoms better or worse period.   The history is provided by the patient and medical records.    Past Medical History  Diagnosis Date  . Asthma   . Obesity    History reviewed. No pertinent past surgical history. No family history on file. History  Substance Use Topics  . Smoking status: Current Some Day Smoker -- 0.50 packs/day    Types: Cigarettes  . Smokeless tobacco: Not on file  . Alcohol Use: Yes   OB History   Grav Para Term Preterm Abortions TAB SAB Ect Mult Living                 Review of Systems  Constitutional: Negative for fever.  HENT: Negative for rhinorrhea and sore throat.   Eyes: Negative for redness.  Respiratory: Negative for cough.   Cardiovascular: Negative for chest pain.  Gastrointestinal: Positive for nausea, vomiting and diarrhea. Negative for abdominal pain.  Genitourinary: Negative for dysuria.   Musculoskeletal: Negative for myalgias.  Skin: Negative for rash.  Neurological: Negative for headaches.   Allergies  Review of patient's allergies indicates no known allergies.  Home Medications   Prior to Admission medications   Medication Sig Start Date End Date Taking? Authorizing Provider  aspirin-acetaminophen-caffeine (EXCEDRIN MIGRAINE) [phone] MG per tablet Take 1 tablet by mouth every 6 (six) hours as needed for headache or migraine.   Yes Historical Provider, MD  ibuprofen (ADVIL,MOTRIN) 800 MG tablet Take 1,600 mg by mouth every 8 (eight) hours as needed for headache or mild pain.   Yes Historical Provider, MD   BP 111/51  Pulse 53  Temp(Src) 98 F (36.7 C) (Oral)  Resp 18  Ht 6' (1.829 m)  Wt 294 lb 6 oz (133.528 kg)  BMI 39.92 kg/m2  SpO2 100%  LMP 12/03/2013  Physical Exam  Nursing note and vitals reviewed. Constitutional: She appears well-developed and well-nourished.  HENT:  Head: Normocephalic and atraumatic.  Mouth/Throat: Oropharynx is clear and moist.  Eyes: Conjunctivae are normal. Right eye exhibits no discharge. Left eye exhibits no discharge.  Neck: Normal range of motion. Neck supple.  Cardiovascular: Normal rate, regular rhythm and normal heart sounds.   No murmur heard. Pulmonary/Chest: Effort normal and breath sounds normal. No respiratory distress. She has no wheezes. She has no rales.  Abdominal: Soft. Bowel sounds are normal. There is no tenderness. There is no rebound and no guarding.  Obese habitus  Neurological: She is alert.  Skin: Skin is  warm and dry.  Psychiatric: She has a normal mood and affect.    ED Course  Procedures (including critical care time) Labs Review Labs Reviewed  BASIC METABOLIC PANEL - Abnormal; Notable for the following:    Glucose, Bld 101 (*)    All other components within normal limits  URINALYSIS, ROUTINE W REFLEX MICROSCOPIC - Abnormal; Notable for the following:    Hgb urine dipstick TRACE (*)     All other components within normal limits  URINE MICROSCOPIC-ADD ON - Abnormal; Notable for the following:    Squamous Epithelial / LPF FEW (*)    Bacteria, UA FEW (*)    All other components within normal limits  CBC WITH DIFFERENTIAL  POC URINE PREG, ED    Imaging Review No results found.   EKG Interpretation None      10:31 AM Patient seen and examined. Work-up initiated. Medications ordered.   Vital signs reviewed and are as follows: Filed Vitals:   12/05/13 0933  BP: 111/51  Pulse: 53  Temp: 98 F (36.7 C)  Resp: 18   Labs from 6/11 reviewed. No calcified gallstones or kidney stones seen on CT from 2011.  1:38 PM Patient improved with Zofran. She is drinking in room without vomiting. Patient and family informed of all lab results.  Will discharge to home with Zofran and omeprazole. PCP and GI followup given.  The patient was urged to return to the Emergency Department immediately with worsening of current symptoms, worsening abdominal pain, persistent vomiting, blood noted in stools, fever, or any other concerns. The patient verbalized understanding.    MDM   Final diagnoses:  Nausea and vomiting   Patient nausea, vomiting, and diarrhea. The pain that she was having 2 days ago is currently resolved. Symptoms improved in emergency department with treatment. Labs are unchanged from previous. No significant pain on abdominal exam today. Symptoms could possibly be due to gastritis or peptic ulcer disease. Vitals are stable, no fever.  No signs of dehydration, tolerating PO's. Lungs are clear. No focal abdominal pain, no concern for appendicitis, cholecystitis, pancreatitis, ruptured viscus, UTI, kidney stone, or any other serious abdominal etiology. Supportive therapy indicated with return if symptoms worsen. Patient counseled.     Renne CriglerJoshua Kynzley Dowson, PA-C 12/05/13 1340

## 2013-12-05 NOTE — ED Notes (Signed)
Pt reports n/v/d since Thursday [facility]. Yesterday felt better, this morning woke up with v/d x 5. Reports she doesn't know what she was dx with on Thursday because she had to leave and catch the bus and forgot to call back and get dx. Pt is a x 4

## 2013-12-05 NOTE — Discharge Instructions (Signed)
Please read and follow all provided instructions.  Your diagnoses today include:  1. Nausea and vomiting     Tests performed today include:  Blood counts and electrolytes  Blood tests to check liver and kidney function  Urine test to look for infection and pregnancy (in women)  Vital signs. See below for your results today.   Medications prescribed:   Omeprazole (Prilosec) - stomach acid reducer  This medication can be found over-the-counter   Zofran (ondansetron) - for nausea and vomiting  Take any prescribed medications only as directed.  Home care instructions:   Follow any educational materials contained in this packet.   Your abdominal pain, nausea, vomiting, and diarrhea may be caused by a viral gastroenteritis also called 'stomach flu'. You should rest for the next several days. Keep drinking plenty of fluids and use the medicine for nausea as directed.    Drink clear liquids for the next 24 hours and introduce solid foods slowly after 24 hours using the b.r.a.t. diet (Bananas, Rice, Applesauce, Toast, Yogurt).    Follow-up instructions: Please follow-up with your primary care provider in the next 2 days for further evaluation of your symptoms. If you are not feeling better in 48 hours you may have a condition that is more serious and you need re-evaluation. You may also see the stomach doctor (GI or gastroenterologist) listed for further examination.   Return instructions:  SEEK IMMEDIATE MEDICAL ATTENTION IF:  If you have pain that does not go away or becomes severe   A temperature above 101F develops   Repeated vomiting occurs (multiple episodes)   If you have pain that becomes localized to portions of the abdomen. The right side could possibly be appendicitis. In an adult, the left lower portion of the abdomen could be colitis or diverticulitis.   Blood is being passed in stools or vomit (bright red or black tarry stools)   You develop chest pain,  difficulty breathing, dizziness or fainting, or become confused, poorly responsive, or inconsolable (young children)  If you have any other emergent concerns regarding your health  Additional Information: Abdominal (belly) pain can be caused by many things. Your caregiver performed an examination and possibly ordered blood/urine tests and imaging (CT scan, x-rays, ultrasound). Many cases can be observed and treated at home after initial evaluation in the emergency department. Even though you are being discharged home, abdominal pain can be unpredictable. Therefore, you need a repeated exam if your pain does not resolve, returns, or worsens. Most patients with abdominal pain don't have to be admitted to the hospital or have surgery, but serious problems like appendicitis and gallbladder attacks can start out as nonspecific pain. Many abdominal conditions cannot be diagnosed in one visit, so follow-up evaluations are very important.  Your vital signs today were: BP 99/45   Pulse 41   Temp(Src) 98 F (36.7 C) (Oral)   Resp 13   Ht 6' (1.829 m)   Wt 294 lb 6 oz (133.528 kg)   BMI 39.92 kg/m2   SpO2 99%   LMP 12/03/2013 If your blood pressure (bp) was elevated above 135/85 this visit, please have this repeated by your doctor within one month. --------------

## 2013-12-05 NOTE — ED Provider Notes (Signed)
Medical screening examination/treatment/procedure(s) were performed by non-physician practitioner and as supervising physician I was immediately available for consultation/collaboration.   EKG Interpretation None       Asahel Risden R. Elane Peabody, MD 12/05/13 1603 

## 2013-12-05 NOTE — ED Notes (Signed)
Pt reports she is feeling better. Resting comfortably in the bed. Family arrived at bedside.

## 2013-12-05 NOTE — ED Notes (Signed)
Gave pt gingerale for fluid challenge 

## 2013-12-05 NOTE — ED Notes (Signed)
Coke provided to patient.

## 2014-05-27 ENCOUNTER — Emergency Department (HOSPITAL_COMMUNITY)
Admission: EM | Admit: 2014-05-27 | Discharge: 2014-05-27 | Disposition: A | Discharge status: Home / Self Care | Payer: Self-pay | Attending: Emergency Medicine | Admitting: Emergency Medicine

## 2014-05-27 ENCOUNTER — Encounter (HOSPITAL_COMMUNITY): Payer: Self-pay | Admitting: *Deleted

## 2014-05-27 ENCOUNTER — Emergency Department (HOSPITAL_COMMUNITY): Payer: Self-pay

## 2014-05-27 DIAGNOSIS — Z7982 Long term (current) use of aspirin: Secondary | ICD-10-CM | POA: Insufficient documentation

## 2014-05-27 DIAGNOSIS — R059 Cough, unspecified: Secondary | ICD-10-CM

## 2014-05-27 DIAGNOSIS — E669 Obesity, unspecified: Secondary | ICD-10-CM | POA: Insufficient documentation

## 2014-05-27 DIAGNOSIS — Z79899 Other long term (current) drug therapy: Secondary | ICD-10-CM | POA: Insufficient documentation

## 2014-05-27 DIAGNOSIS — J069 Acute upper respiratory infection, unspecified: Secondary | ICD-10-CM | POA: Insufficient documentation

## 2014-05-27 DIAGNOSIS — Z720 Tobacco use: Secondary | ICD-10-CM | POA: Insufficient documentation

## 2014-05-27 DIAGNOSIS — J45909 Unspecified asthma, uncomplicated: Secondary | ICD-10-CM | POA: Insufficient documentation

## 2014-05-27 DIAGNOSIS — R05 Cough: Secondary | ICD-10-CM

## 2014-05-27 MED ORDER — IBUPROFEN 800 MG PO TABS
800.0000 mg | ORAL_TABLET | Freq: Once | ORAL | Status: AC
  Administered 2014-05-27: 800 mg via ORAL
  Filled 2014-05-27: qty 1

## 2014-05-27 MED ORDER — PROMETHAZINE HCL 25 MG PO TABS: 25.0000 mg | ORAL_TABLET | Freq: Four times a day (QID) | ORAL | Status: DC | PRN

## 2014-05-27 NOTE — ED Provider Notes (Signed)
CSN: [id number]     Arrival date & time 05/27/14  1116 History   First MD Initiated Contact with Patient 05/27/14 1128     Chief Complaint  Patient presents with  . Fever      HPI Patient started with upper respiratory symptoms last few days.  Today had nausea vomiting diarrhea 1.  Roommate had similar symptoms last week.  She is better . Past Medical History  Diagnosis Date  . Asthma   . Obesity    History reviewed. No pertinent past surgical history. No family history on file. History  Substance Use Topics  . Smoking status: Current Some Day Smoker -- 0.50 packs/day    Types: Cigarettes  . Smokeless tobacco: Not on file  . Alcohol Use: Yes   OB History    No data available     Review of Systems  All other systems reviewed and are negative  Allergies  Review of patient's allergies indicates no known allergies.  Home Medications   Prior to Admission medications   Medication Sig Start Date End Date Taking? Authorizing Provider  aspirin-acetaminophen-caffeine (EXCEDRIN MIGRAINE) [phone] MG per tablet Take 1 tablet by mouth every 6 (six) hours as needed for headache or migraine.    Historical Provider, MD  ibuprofen (ADVIL,MOTRIN) 800 MG tablet Take 1,600 mg by mouth every 8 (eight) hours as needed for headache or mild pain.    Historical Provider, MD  omeprazole (PRILOSEC) 20 MG capsule Take one capsule PO twice a day for 3 days, then one capsule PO once a day 12/05/13   Renne CriglerJoshua Geiple, PA-C  ondansetron (ZOFRAN ODT) 4 MG disintegrating tablet Take 1 tablet (4 mg total) by mouth every 8 (eight) hours as needed for nausea or vomiting. 12/05/13   Renne CriglerJoshua Geiple, PA-C   BP 130/72 mmHg  Pulse 72  Temp(Src) 100.1 F (37.8 C) (Oral)  Resp 18  Ht 6\' 1"  (1.854 m)  Wt 283 lb (128.368 kg)  BMI 37.35 kg/m2  SpO2 98%  LMP 05/22/2014 (Exact Date) Physical Exam Physical Exam  Nursing note and vitals reviewed. Constitutional: She is oriented to person, place, and time. She  appears well-developed and well-nourished. No distress.  HENT:  Head: Normocephalic and atraumatic.  Eyes: Pupils are equal, round, and reactive to light.  Neck: Normal range of motion.  supple no meningeal signs Cardiovascular: Normal rate and intact distal pulses.   Pulmonary/Chest: No respiratory distress.  no wheezes rales or rhonchi to auscultation. Abdominal: Normal appearance. She exhibits no distension.  Musculoskeletal: Normal range of motion.  no abdominal tenderness, rebound, guarding. Neurological: She is alert and oriented to person, place, and time. No cranial nerve deficit.  Skin: Skin is warm and dry. No rash noted.    ED Course  Procedures (including critical care time) Labs Review Labs Reviewed - No data to display  Imaging Review Dg Chest 2 View  05/27/2014   CLINICAL DATA:  Cough and difficulty breathing for several days  EXAM: CHEST  2 VIEW  COMPARISON:  June 08, 2011  FINDINGS: Lungs are clear. Heart size and pulmonary vascularity are normal. No adenopathy. No bone lesions.  IMPRESSION: No edema or consolidation.   Electronically Signed   By: Bretta BangWilliam  Woodruff M.D.   On: 05/27/2014 12:48      MDM   Final diagnoses:  Cough  Viral URI        Nelia Shiobert L Xue Low, MD 05/27/14 1251

## 2014-05-27 NOTE — ED Notes (Signed)
Brought pt back to room; pt undressed, in gown, on continuous pulse oximetry and blood pressure cuff ; Ethel RanaHeather S, EMT aware of pt

## 2014-05-27 NOTE — Discharge Instructions (Signed)
Fever, Adult °A fever is a higher than normal body temperature. In an adult, an oral temperature around 98.6° F (37° C) is considered normal. A temperature of 100.4° F (38° C) or higher is generally considered a fever. Mild or moderate fevers generally have no long-term effects and often do not require treatment. Extreme fever (greater than or equal to 106° F or 41.1° C) can cause seizures. The sweating that may occur with repeated or prolonged fever may cause dehydration. Elderly people can develop confusion during a fever. °A measured temperature can vary with: °· Age. °· Time of day. °· Method of measurement (mouth, underarm, rectal, or ear). °The fever is confirmed by taking a temperature with a thermometer. Temperatures can be taken different ways. Some methods are accurate and some are not. °· An oral temperature is used most commonly. Electronic thermometers are fast and accurate. °· An ear temperature will only be accurate if the thermometer is positioned as recommended by the manufacturer. °· A rectal temperature is accurate and done for those adults who have a condition where an oral temperature cannot be taken. °· An underarm (axillary) temperature is not accurate and not recommended. °Fever is a symptom, not a disease.  °CAUSES  °· Infections commonly cause fever. °· Some noninfectious causes for fever include: °· Some arthritis conditions. °· Some thyroid or adrenal gland conditions. °· Some immune system conditions. °· Some types of cancer. °· A medicine reaction. °· High doses of certain street drugs such as methamphetamine. °· Dehydration. °· Exposure to high outside or room temperatures. °· Occasionally, the source of a fever cannot be determined. This is sometimes called a "fever of unknown origin" (FUO). °· Some situations may lead to a temporary rise in body temperature that may go away on its own. Examples are: °· Childbirth. °· Surgery. °· Intense exercise. °HOME CARE INSTRUCTIONS  °· Take  appropriate medicines for fever. Follow dosing instructions carefully. If you use acetaminophen to reduce the fever, be careful to avoid taking other medicines that also contain acetaminophen. Do not take aspirin for a fever if you are younger than age 19. There is an association with Reye's syndrome. Reye's syndrome is a rare but potentially deadly disease. °· If an infection is present and antibiotics have been prescribed, take them as directed. Finish them even if you start to feel better. °· Rest as needed. °· Maintain an adequate fluid intake. To prevent dehydration during an illness with prolonged or recurrent fever, you may need to drink extra fluid. Drink enough fluids to keep your urine clear or pale yellow. °· Sponging or bathing with room temperature water may help reduce body temperature. Do not use ice water or alcohol sponge baths. °· Dress comfortably, but do not over-bundle. °SEEK MEDICAL CARE IF:  °· You are unable to keep fluids down. °· You develop vomiting or diarrhea. °· You are not feeling at least partly better after 3 days. °· You develop new symptoms or problems. °SEEK IMMEDIATE MEDICAL CARE IF:  °· You have shortness of breath or trouble breathing. °· You develop excessive weakness. °· You are dizzy or you faint. °· You are extremely thirsty or you are making little or no urine. °· You develop new pain that was not there before (such as in the head, neck, chest, back, or abdomen). °· You have persistent vomiting and diarrhea for more than 1 to 2 days. °· You develop a stiff neck or your eyes become sensitive to light. °· You develop a   skin rash.  You have a fever or persistent symptoms for more than 2 to 3 days.  You have a fever and your symptoms suddenly get worse. MAKE SURE YOU:   Understand these instructions.  Will watch your condition.  Will get help right away if you are not doing well or get worse. Document Released: 12/05/2000 Document Revised: 10/26/2013 Document  Reviewed: 04/12/2011 [facility] Patient Information 2015 [place], [place]. This information is not intended to replace advice given to you by your health care provider. Make sure you discuss any questions you have with your health care provider.  Cough, Adult  A cough is a reflex. It helps you clear your throat and airways. A cough can help heal your body. A cough can last 2 or 3 weeks (acute) or may last more than 8 weeks (chronic). Some common causes of a cough can include an infection, allergy, or a cold. HOME CARE  Only take medicine as told by your doctor.  If given, take your medicines (antibiotics) as told. Finish them even if you start to feel better.  Use a cold steam vaporizer or humidifier in your home. This can help loosen thick spit (secretions).  Sleep so you are almost sitting up (semi-upright). Use pillows to do this. This helps reduce coughing.  Rest as needed.  Stop smoking if you smoke. GET HELP RIGHT AWAY IF:  You have yellowish-white fluid (pus) in your thick spit.  Your cough gets worse.  Your medicine does not reduce coughing, and you are losing sleep.  You cough up blood.  You have trouble breathing.  Your pain gets worse and medicine does not help.  You have a fever. MAKE SURE YOU:   Understand these instructions.  Will watch your condition.  Will get help right away if you are not doing well or get worse. Document Released: 02/22/2011 Document Revised: 10/26/2013 Document Reviewed: 02/22/2011 [facility] Patient Information 2015 [place], [place]. This information is not intended to replace advice given to you by your health care provider. Make sure you discuss any questions you have with your health care provider.

## 2014-05-27 NOTE — ED Notes (Signed)
Patient states cold symptoms x 3 days but starting last night patient running high fevers, n/v/d x 1 episode this am,

## 2014-07-02 ENCOUNTER — Emergency Department (HOSPITAL_COMMUNITY): Payer: Self-pay

## 2014-07-02 ENCOUNTER — Encounter (HOSPITAL_COMMUNITY): Payer: Self-pay | Admitting: Emergency Medicine

## 2014-07-02 ENCOUNTER — Emergency Department (HOSPITAL_COMMUNITY)
Admission: EM | Admit: 2014-07-02 | Discharge: 2014-07-02 | Disposition: A | Discharge status: Home / Self Care | Payer: Self-pay | Attending: Emergency Medicine | Admitting: Emergency Medicine

## 2014-07-02 DIAGNOSIS — Z7982 Long term (current) use of aspirin: Secondary | ICD-10-CM | POA: Insufficient documentation

## 2014-07-02 DIAGNOSIS — Z720 Tobacco use: Secondary | ICD-10-CM | POA: Insufficient documentation

## 2014-07-02 DIAGNOSIS — J45909 Unspecified asthma, uncomplicated: Secondary | ICD-10-CM | POA: Insufficient documentation

## 2014-07-02 DIAGNOSIS — K5900 Constipation, unspecified: Secondary | ICD-10-CM

## 2014-07-02 DIAGNOSIS — E669 Obesity, unspecified: Secondary | ICD-10-CM | POA: Insufficient documentation

## 2014-07-02 DIAGNOSIS — Z79899 Other long term (current) drug therapy: Secondary | ICD-10-CM | POA: Insufficient documentation

## 2014-07-02 MED ORDER — DOCUSATE SODIUM 100 MG PO CAPS: 100.0000 mg | ORAL_CAPSULE | Freq: Two times a day (BID) | ORAL | Status: DC

## 2014-07-02 MED ORDER — POLYETHYLENE GLYCOL 3350 17 G PO PACK: 17.0000 g | PACK | Freq: Every day | ORAL | Status: DC

## 2014-07-02 MED ORDER — POLYETHYLENE GLYCOL 3350 17 G PO PACK
17.0000 g | PACK | Freq: Once | ORAL | Status: AC
  Administered 2014-07-02: 17 g via ORAL
  Filled 2014-07-02: qty 1

## 2014-07-02 MED ORDER — FLEET ENEMA 7-19 GM/118ML RE ENEM
1.0000 | ENEMA | Freq: Once | RECTAL | Status: AC
  Administered 2014-07-02: 1 via RECTAL
  Filled 2014-07-02: qty 1

## 2014-07-02 NOTE — Discharge Instructions (Signed)
If you were given medicines take as directed.  If you are on coumadin or contraceptives realize their levels and effectiveness is altered by many different medicines.  If you have any reaction (rash, tongues swelling, other) to the medicines stop taking and see a physician.   Exercise, drink plenty of water, use medicines until soft and then stop and increase vegetables/ fiber.   Please follow up as directed and return to the ER or see a physician for new or worsening symptoms.  Thank you. Filed Vitals:   07/02/14 1903 07/02/14 2240  BP: 115/65 126/68  Pulse: 59 53  Temp: 98.2 F (36.8 C)   TempSrc: Oral   Resp: 18 16  Height: 5\' 11"  (1.803 m)   Weight: 282 lb 9.6 oz (128.187 kg)   SpO2: 99% 98%   \

## 2014-07-02 NOTE — ED Notes (Signed)
Wait time discussed 

## 2014-07-02 NOTE — ED Provider Notes (Signed)
CSN: [id number]     Arrival date & time 07/02/14  1848 History  This chart was scribed for Enid Skeens, MD by Modena Jansky, ED Scribe. This patient was seen in room D36C/D36C and the patient's care was started at 11:11 PM.   Chief Complaint  Patient presents with  . Constipation   The history is provided by the patient. No language interpreter was used.   HPI Comments: Leslie Yates is a 24 y.o. female who presents to the Emergency Department complaining of constant moderate constipation that started about 2 weeks ago. She reports that she had an episode starting 2 weeks back where she was taken to [facility] and given a Rx for stool softner. She reports that the stool softner provided only temporary relief. She states that she has had none BM at all for the past week, and only a small, hard BM a week ago. She states that she tried an enema without any relief. She reports that she has a family hx of constipation. She denies any surgeries or medications. She denies any blood in stool, cramping, or recent weight loss.   Past Medical History  Diagnosis Date  . Asthma   . Obesity    History reviewed. No pertinent past surgical history. History reviewed. No pertinent family history. History  Substance Use Topics  . Smoking status: Current Some Day Smoker -- 0.50 packs/day    Types: Cigarettes  . Smokeless tobacco: Not on file  . Alcohol Use: Yes   OB History    No data available     Review of Systems  Constitutional: Negative for unexpected weight change.  Gastrointestinal: Positive for constipation. Negative for abdominal pain and blood in stool.  All other systems reviewed and are negative.   Allergies  Review of patient's allergies indicates no known allergies.  Home Medications   Prior to Admission medications   Medication Sig Start Date End Date Taking? Authorizing Provider  aspirin-acetaminophen-caffeine (EXCEDRIN MIGRAINE) [phone] MG per tablet Take 1 tablet by  mouth every 6 (six) hours as needed for headache or migraine.    Historical Provider, MD  ibuprofen (ADVIL,MOTRIN) 800 MG tablet Take 1,600 mg by mouth every 8 (eight) hours as needed for headache or mild pain.    Historical Provider, MD  omeprazole (PRILOSEC) 20 MG capsule Take one capsule PO twice a day for 3 days, then one capsule PO once a day 12/05/13   Renne Crigler, PA-C  ondansetron (ZOFRAN ODT) 4 MG disintegrating tablet Take 1 tablet (4 mg total) by mouth every 8 (eight) hours as needed for nausea or vomiting. 12/05/13   Renne Crigler, PA-C  promethazine (PHENERGAN) 25 MG tablet Take 1 tablet (25 mg total) by mouth every 6 (six) hours as needed for nausea or vomiting. 05/27/14   Nelia Shi, MD   BP 126/68 mmHg  Pulse 53  Temp(Src) 98.2 F (36.8 C) (Oral)  Resp 16  Ht  (1.803 m)  Wt 282 lb 9.6 oz (128.187 kg)  BMI 39.43 kg/m2  SpO2 98% Physical Exam  Constitutional: She is oriented to person, place, and time. She appears well-developed and well-nourished. No distress.  HENT:  Head: Normocephalic and atraumatic.  Neck: Neck supple. No tracheal deviation present.  Cardiovascular: Normal rate and regular rhythm.   Pulmonary/Chest: Effort normal. No respiratory distress.  Abdominal: Soft. There is no tenderness.  Musculoskeletal: Normal range of motion. She exhibits no edema.  No leg swelling.   Neurological: She is alert  and oriented to person, place, and time.  Skin: Skin is warm and dry.  Psychiatric: She has a normal mood and affect. Her behavior is normal.  Nursing note and vitals reviewed.   ED Course  Procedures (including critical care time) DIAGNOSTIC STUDIES: Oxygen Saturation is 98% on RA, normal by my interpretation.    COORDINATION OF CARE: 11:15 PM- Pt advised of plan for treatment which includes medication and radiology and pt agrees.  Labs Review Labs Reviewed - No data to display  Imaging Review Dg Abd Acute W/chest  07/02/2014   CLINICAL DATA:   Constipation, left lower quadrant abdominal pain  EXAM: ACUTE ABDOMEN SERIES (ABDOMEN 2 VIEW & CHEST 1 VIEW)  COMPARISON:  Chest radiographs dated 05/27/2014  FINDINGS: Lungs are clear.  No pleural effusion or pneumothorax.  The heart is normal in size.  Nonobstructive bowel gas pattern. No evidence of free air under the diaphragm on the upright view.  Mild to moderate colonic stool burden.  Mild degenerative changes of the lower lumbar spine.  IMPRESSION: No evidence of acute cardiopulmonary disease.  No evidence of small bowel obstruction or free air.   Electronically Signed   By: Charline BillsSriyesh  Krishnan M.D.   On: 07/02/2014 19:54     EKG Interpretation None      MDM   Final diagnoses:  Constipation   I personally performed the services described in this documentation, which was scribed in my presence. The recorded information has been reviewed and is accurate.  Patient with clinically constipation, benign abdominal exam, vitals unremarkable except for mild bradycardia. Discussed supportive care and treatments in the ER and outpatient. Results and differential diagnosis were discussed with the patient/parent/guardian. Close follow up outpatient was discussed, comfortable with the plan.   Medications  sodium phosphate (FLEET) 7-19 GM/118ML enema 1 enema (1 enema Rectal Given 07/02/14 2326)  polyethylene glycol (MIRALAX / GLYCOLAX) packet 17 g (17 g Oral Given 07/02/14 2333)    Filed Vitals:   07/02/14 2245 07/02/14 2300 07/02/14 2315 07/02/14 2334  BP: 120/71 136/94 93/64   Pulse: 58 48 46   Temp:    98.6 F (37 C)  TempSrc:    Oral  Resp:      Height:      Weight:      SpO2: 100% 99% 100%     Final diagnoses:  Constipation, unspecified constipation type      Enid SkeensJoshua M Annjanette Wertenberger, MD 07/03/14 [phone]

## 2014-07-02 NOTE — ED Notes (Signed)
Pt sts constipation x 1week with no relief with enema; pt sts nausea and rectal pain/pressure

## 2014-12-19 ENCOUNTER — Emergency Department (INDEPENDENT_AMBULATORY_CARE_PROVIDER_SITE_OTHER)
Admission: EM | Admit: 2014-12-19 | Discharge: 2014-12-19 | Disposition: A | Discharge status: Home / Self Care | Payer: Self-pay | Source: Home / Self Care | Attending: Family Medicine | Admitting: Family Medicine

## 2014-12-19 ENCOUNTER — Encounter (HOSPITAL_COMMUNITY): Payer: Self-pay | Admitting: Family Medicine

## 2014-12-19 DIAGNOSIS — A084 Viral intestinal infection, unspecified: Secondary | ICD-10-CM

## 2014-12-19 MED ORDER — ONDANSETRON HCL 4 MG PO TABS: 4.0000 mg | ORAL_TABLET | Freq: Three times a day (TID) | ORAL | Status: DC | PRN

## 2014-12-19 MED ORDER — ONDANSETRON 4 MG PO TBDP
8.0000 mg | ORAL_TABLET | Freq: Once | ORAL | Status: AC
  Administered 2014-12-19: 8 mg via ORAL

## 2014-12-19 MED ORDER — ONDANSETRON 4 MG PO TBDP
ORAL_TABLET | ORAL | Status: AC
  Filled 2014-12-19: qty 2

## 2014-12-19 NOTE — ED Notes (Signed)
Pt states that she has had diarrhea and nausea and vomitting for a week

## 2014-12-19 NOTE — Discharge Instructions (Signed)
Your symptoms are likely due to a got infection called viral gastroenteritis. This should resolve in another 1-7 days. Please stay well hydrated. Please use the Zofran for additional nausea relief. Please use Imodium for diarrhea control. Please go to the emergency room to get worse.

## 2014-12-19 NOTE — ED Provider Notes (Signed)
CSN: [id number]     Arrival date & time 12/19/14  1302 History   First MD Initiated Contact with Patient 12/19/14 1325     No chief complaint on file.  (Consider location/radiation/quality/duration/timing/severity/associated sxs/prior Treatment) HPI  7 days ago developed nausea, vomiting and diarrhea. Intermittent. Acutely worsened 1 day ago. gingerail w/o improvement. 2-3 episodes per day. Vomit is non-bloody and non-bilous. Diarrhea is non bloody.   Sexually active with females only.  Denies fevers, dysuria, frequency, rash, headache, neck stiffness, chest pain, shortness of breath, dizziness, LOC.    Past Medical History  Diagnosis Date  . Asthma   . Obesity    History reviewed. No pertinent past surgical history. Family History  Problem Relation Age of Onset  . Diabetes Mother   . Hypertension Mother   . Diabetes Father   . Hypertension Father    History  Substance Use Topics  . Smoking status: Current Some Day Smoker -- 0.50 packs/day    Types: Cigarettes  . Smokeless tobacco: Not on file  . Alcohol Use: Yes   OB History    No data available     Review of Systems Per HPI with all other pertinent systems negative.   Allergies  Penicillins  Home Medications   Prior to Admission medications   Medication Sig Start Date End Date Taking? Authorizing Provider  aspirin-acetaminophen-caffeine (EXCEDRIN MIGRAINE) [phone] MG per tablet Take 1 tablet by mouth every 6 (six) hours as needed for headache or migraine.    Historical Provider, MD  ibuprofen (ADVIL,MOTRIN) 800 MG tablet Take 1,600 mg by mouth every 8 (eight) hours as needed for headache or mild pain.    Historical Provider, MD  ondansetron (ZOFRAN) 4 MG tablet Take 1-2 tablets (4-8 mg total) by mouth every 8 (eight) hours as needed for nausea or vomiting. 12/19/14   Ozella Rocks, MD   BP 119/69 mmHg  Pulse 60  Temp(Src) 98.5 F (36.9 C) (Oral)  Resp 16  SpO2 96% Physical Exam Physical Exam   Constitutional: oriented to person, place, and time. appears well-developed and well-nourished. No distress.  HENT:  Head: Normocephalic and atraumatic.  Eyes: EOMI. PERRL.  Neck: Normal range of motion.  Cardiovascular: RRR, no m/r/g, 2+ distal pulses,  Pulmonary/Chest: Effort normal and breath sounds normal. No respiratory distress.  Abdominal: Soft. Bowel sounds are normal. NonTTP, no distension.  Musculoskeletal: Normal range of motion. Non ttp, no effusion.  Neurological: alert and oriented to person, place, and time.  Skin: Skin is warm. No rash noted. non diaphoretic.  Psychiatric: normal mood and affect. behavior is normal. Judgment and thought content normal.   ED Course  Procedures (including critical care time) Labs Review Labs Reviewed - No data to display  Imaging Review No results found.   MDM   1. Viral gastroenteritis    Zofran ODT 8 mg given in clinic. Patient's partner driving her home. Patient to continue Zofran at home and stay well-hydrated. May use Imodium sparingly. No need for further workup or management at this time.    Ozella Rocks, MD 12/19/14 1336

## 2015-01-18 ENCOUNTER — Emergency Department (HOSPITAL_COMMUNITY)
Admission: EM | Admit: 2015-01-18 | Discharge: 2015-01-18 | Disposition: A | Discharge status: Home / Self Care | Payer: Self-pay | Attending: Emergency Medicine | Admitting: Emergency Medicine

## 2015-01-18 ENCOUNTER — Encounter (HOSPITAL_COMMUNITY): Payer: Self-pay | Admitting: Emergency Medicine

## 2015-01-18 DIAGNOSIS — Z880 Allergy status to penicillin: Secondary | ICD-10-CM | POA: Insufficient documentation

## 2015-01-18 DIAGNOSIS — E669 Obesity, unspecified: Secondary | ICD-10-CM | POA: Insufficient documentation

## 2015-01-18 DIAGNOSIS — Z720 Tobacco use: Secondary | ICD-10-CM | POA: Insufficient documentation

## 2015-01-18 DIAGNOSIS — H2000 Unspecified acute and subacute iridocyclitis: Secondary | ICD-10-CM | POA: Insufficient documentation

## 2015-01-18 DIAGNOSIS — Z7982 Long term (current) use of aspirin: Secondary | ICD-10-CM | POA: Insufficient documentation

## 2015-01-18 DIAGNOSIS — J45909 Unspecified asthma, uncomplicated: Secondary | ICD-10-CM | POA: Insufficient documentation

## 2015-01-18 DIAGNOSIS — H209 Unspecified iridocyclitis: Secondary | ICD-10-CM

## 2015-01-18 MED ORDER — CYCLOPENTOLATE HCL 1 % OP SOLN
1.0000 [drp] | Freq: Once | OPHTHALMIC | Status: AC
  Administered 2015-01-18: 1 [drp] via OPHTHALMIC
  Filled 2015-01-18: qty 2

## 2015-01-18 MED ORDER — OXYCODONE-ACETAMINOPHEN 5-325 MG PO TABS: 1.0000 | ORAL_TABLET | ORAL | Status: DC | PRN

## 2015-01-18 MED ORDER — FLUORESCEIN SODIUM 1 MG OP STRP
1.0000 | ORAL_STRIP | Freq: Once | OPHTHALMIC | Status: AC
  Administered 2015-01-18: 1 via OPHTHALMIC
  Filled 2015-01-18: qty 1

## 2015-01-18 MED ORDER — PREDNISOLONE ACETATE 1 % OP SUSP
1.0000 [drp] | Freq: Once | OPHTHALMIC | Status: AC
  Administered 2015-01-18: 1 [drp] via OPHTHALMIC
  Filled 2015-01-18: qty 1

## 2015-01-18 MED ORDER — TETRACAINE HCL 0.5 % OP SOLN
2.0000 [drp] | Freq: Once | OPHTHALMIC | Status: AC
  Administered 2015-01-18: 2 [drp] via OPHTHALMIC
  Filled 2015-01-18: qty 2

## 2015-01-18 MED ORDER — HYDROMORPHONE HCL 1 MG/ML IJ SOLN
1.0000 mg | Freq: Once | INTRAMUSCULAR | Status: AC
  Administered 2015-01-18: 1 mg via INTRAMUSCULAR
  Filled 2015-01-18: qty 1

## 2015-01-18 MED ORDER — ONDANSETRON 4 MG PO TBDP
4.0000 mg | ORAL_TABLET | Freq: Once | ORAL | Status: AC
  Administered 2015-01-18: 4 mg via ORAL
  Filled 2015-01-18 (×2): qty 1

## 2015-01-18 MED ORDER — HOMATROPINE HBR 2 % OP SOLN
1.0000 [drp] | Freq: Once | OPHTHALMIC | Status: DC
  Filled 2015-01-18: qty 5

## 2015-01-18 NOTE — ED Notes (Signed)
Notified pharmacy for drops.

## 2015-01-18 NOTE — Discharge Instructions (Signed)
Please read and follow all provided instructions.  Your diagnoses today include:  1. Anterior uveitis    Tests performed today include:  Visual acuity testing to check your vision  Fluorescein dye examination to look for scratches on your eye  Vital signs. See below for your results today.   Medications prescribed:   Prednisolone acetate - 1 drop in right eye, 4 times per day   Homatropine - 1 drop in right eye, 2 times per day  This medication will make your pupil big.  Take any prescribed medications only as directed.  Home care instructions:  Follow any educational materials contained in this packet. If you wear contact lenses, do not use them until your eye caregiver approves. Follow-up care is necessary to be sure the infection is healing if not completely resolved in 2-3 days. See your caregiver or eye specialist as suggested for followup.   Follow-up instructions: Please follow-up with Dr. Clarisa Kindred tomorrow. She wants you to go to her office at noon.   Return instructions:   Please return to the Emergency Department if you experience worsening symptoms.   Please return immediately if you develop severe pain, pus drainage, new change in vision, or fever.  Please return if you have any other emergent concerns.  Additional Information:  Your vital signs today were: BP 139/74 mmHg   Pulse 55   Temp(Src) 98.1 F (36.7 C) (Oral)   Resp 18   SpO2 100%   LMP 12/23/2014 If your blood pressure (BP) was elevated above 135/85 this visit, please have this repeated by your doctor within one month. ---------------

## 2015-01-18 NOTE — ED Provider Notes (Signed)
CSN: [id number]     Arrival date & time 01/18/15  0808 History   First MD Initiated Contact with Patient 01/18/15 0827     Chief Complaint  Patient presents with  . Eye Pain     (Consider location/radiation/quality/duration/timing/severity/associated sxs/prior Treatment) HPI Comments: Patient resents with 3 days of right eye pain and severe photophobia. Pain was mild and has gradually worsened. She denies injury or trauma. No history of autoimmune diseases. No fever, swelling or redness around the eye, URI symptoms. No treatments prior to arrival other than antibiotic eyedrops 1 that were a family member's. The onset of this condition was acute. The course is constant. Aggravating factors: light. Alleviating factors: none.    The history is provided by the patient.    Past Medical History  Diagnosis Date  . Asthma   . Obesity    History reviewed. No pertinent past surgical history. Family History  Problem Relation Age of Onset  . Diabetes Mother   . Hypertension Mother   . Diabetes Father   . Hypertension Father    History  Substance Use Topics  . Smoking status: Current Some Day Smoker -- 0.50 packs/day    Types: Cigarettes  . Smokeless tobacco: Not on file  . Alcohol Use: Yes   OB History    No data available     Review of Systems  Constitutional: Negative for fever.  HENT: Negative for rhinorrhea and sore throat.   Eyes: Positive for photophobia, pain and redness. Negative for discharge, itching and visual disturbance.  Respiratory: Negative for cough.   Cardiovascular: Negative for chest pain.  Gastrointestinal: Negative for nausea, vomiting, abdominal pain and diarrhea.  Genitourinary: Negative for dysuria.  Musculoskeletal: Negative for myalgias.  Skin: Negative for rash.  Neurological: Negative for headaches.    Allergies  Penicillins  Home Medications   Prior to Admission medications   Medication Sig Start Date End Date Taking? Authorizing Provider   aspirin-acetaminophen-caffeine (EXCEDRIN MIGRAINE) [phone] MG per tablet Take 1 tablet by mouth every 6 (six) hours as needed for headache or migraine.    Historical Provider, MD  ibuprofen (ADVIL,MOTRIN) 800 MG tablet Take 1,600 mg by mouth every 8 (eight) hours as needed for headache or mild pain.    Historical Provider, MD  ondansetron (ZOFRAN) 4 MG tablet Take 1-2 tablets (4-8 mg total) by mouth every 8 (eight) hours as needed for nausea or vomiting. 12/19/14   Ozella Rocks, MD   BP 139/74 mmHg  Pulse 55  Temp(Src) 98.1 F (36.7 C) (Oral)  Resp 18  SpO2 100%  LMP 12/23/2014   Physical Exam  Constitutional: She appears well-developed and well-nourished. She appears distressed.  Tearful, covering eye  HENT:  Head: Normocephalic and atraumatic.  Eyes: Conjunctivae are normal. Right eye exhibits no discharge. Left eye exhibits no discharge.  Fundoscopic exam:      The left eye shows no hemorrhage.  Slit lamp exam:      The left eye shows corneal flare. The left eye shows no corneal abrasion, no corneal ulcer, no foreign body, no hyphema and no fluorescein uptake.  R eye: + ciliary flush, - cells in anterior chamber. Cornea is clear, no signs of HSV keratitis or other infiltrate.   Neck: Normal range of motion. Neck supple.  Cardiovascular: Normal rate, regular rhythm and normal heart sounds.   Pulmonary/Chest: Effort normal and breath sounds normal.  Abdominal: Soft. There is no tenderness.  Neurological: She is alert.  Skin: Skin is warm  and dry.  Psychiatric: She has a normal mood and affect.  Nursing note and vitals reviewed.   ED Course  Procedures (including critical care time) Labs Review Labs Reviewed - No data to display  Imaging Review No results found.   EKG Interpretation None       8:47 AM Patient seen and examined.    Vital signs reviewed and are as follows: BP 139/74 mmHg  Pulse 55  Temp(Src) 98.1 F (36.7 C) (Oral)  Resp 18  SpO2 100%  LMP  12/23/2014  Two drops of tetracaine instilled into affected eye.   Fluorescein strip applied to affected eye. Slit lamp used to assess for corneal abrasion. No corneal abrasion identified. No foreign bodies noted. No visible hyphema.   Patient tolerated procedure well without immediate complication.   9:38 AM Discussed with Dr. Jodi Mourning. Will call opthalmology for reccs.   10:04 AM Spoke with Dr. Clarisa Kindred, will see in office tomorrow at noon. Reccs: prednisolone acetate 4x/day, homatropine bid.   Patient seen by Dr. Jodi Mourning. Patient informed of plan. Patient counseled on use of narcotic pain medications. Counseled not to combine these medications with others containing tylenol. Urged not to drink alcohol, drive, or perform any other activities that requires focus while taking these medications. The patient verbalizes understanding and agrees with the plan.    MDM   Final diagnoses:  Anterior uveitis   Patient with clinical anterior uveitis. Eye specialist follow-up obtained. Patient started on meds in ED. D/c to home with follow-up tomorrow.     Renne Crigler, PA-C 01/18/15 1015  Blane Ohara, MD 01/18/15 1018

## 2015-01-18 NOTE — ED Notes (Signed)
Pt crying, states eye hurts, hurts to hold eye open-- "I could not open it this am" when asked how she got her eye opened-- stated-- "turned off the lights" pt is photophobic, denies drainage,

## 2015-01-25 ENCOUNTER — Encounter (HOSPITAL_COMMUNITY): Payer: Self-pay | Admitting: Emergency Medicine

## 2015-01-25 ENCOUNTER — Emergency Department (INDEPENDENT_AMBULATORY_CARE_PROVIDER_SITE_OTHER)
Admission: EM | Admit: 2015-01-25 | Discharge: 2015-01-25 | Disposition: A | Discharge status: Home / Self Care | Payer: Self-pay | Source: Home / Self Care | Attending: Emergency Medicine | Admitting: Emergency Medicine

## 2015-01-25 DIAGNOSIS — H53141 Visual discomfort, right eye: Secondary | ICD-10-CM

## 2015-01-25 DIAGNOSIS — H5319 Other subjective visual disturbances: Secondary | ICD-10-CM

## 2015-01-25 NOTE — ED Notes (Signed)
C/o right eye pain States she was seen at ER for same sx Saw opth last week Drops and steroids was given as tx

## 2015-01-25 NOTE — ED Provider Notes (Signed)
CSN: [id number]     Arrival date & time 01/25/15  1907 History   First MD Initiated Contact with Patient 01/25/15 1952     Chief Complaint  Patient presents with  . Eye Pain   (Consider location/radiation/quality/duration/timing/severity/associated sxs/prior Treatment) HPI  She is a 24 year old woman here for follow-up of right eye pain. She was seen in the emergency room last week for right eye pain and photophobia. She was seen by an eye doctor last week and diagnosed with uveitis. She was started on drops and pain medicine. Today, she states she continues to have a lot of pain, particularly with bright lights. She is asking for a work note so that she can wear sunglasses at work.  Past Medical History  Diagnosis Date  . Asthma   . Obesity    History reviewed. No pertinent past surgical history. Family History  Problem Relation Age of Onset  . Diabetes Mother   . Hypertension Mother   . Diabetes Father   . Hypertension Father    History  Substance Use Topics  . Smoking status: Current Some Day Smoker -- 0.50 packs/day    Types: Cigarettes  . Smokeless tobacco: Not on file  . Alcohol Use: Yes   OB History    No data available     Review of Systems As in history of present illness Allergies  Penicillins  Home Medications   Prior to Admission medications   Medication Sig Start Date End Date Taking? Authorizing Provider  aspirin-acetaminophen-caffeine (EXCEDRIN MIGRAINE) [phone] MG per tablet Take 1 tablet by mouth every 6 (six) hours as needed for headache or migraine.    Historical Provider, MD  ibuprofen (ADVIL,MOTRIN) 800 MG tablet Take 1,600 mg by mouth every 8 (eight) hours as needed for headache or mild pain.    Historical Provider, MD  ondansetron (ZOFRAN) 4 MG tablet Take 1-2 tablets (4-8 mg total) by mouth every 8 (eight) hours as needed for nausea or vomiting. 12/19/14   Ozella Rocks, MD  oxyCODONE-acetaminophen (PERCOCET/ROXICET) 5-325 MG per tablet Take 1  tablet by mouth every 4 (four) hours as needed for severe pain. 01/18/15   Renne Crigler, PA-C   BP 93/58 mmHg  Pulse 51  Temp(Src) 98.4 F (36.9 C) (Oral)  Resp 16  SpO2 98%  LMP 12/23/2014 Physical Exam  Constitutional: She is oriented to person, place, and time. She appears well-developed and well-nourished. No distress.  Cardiovascular: Normal rate.   Pulmonary/Chest: Effort normal.  Neurological: She is alert and oriented to person, place, and time.    ED Course  Procedures (including critical care time) Labs Review Labs Reviewed - No data to display  Imaging Review No results found.   MDM   1. Photophobia of right eye    This is secondary to a uveitis that is being treated. Work note provided. She will follow-up with the eye specialist as scheduled later this month.    Charm Rings, MD 01/25/15 2015

## 2015-01-25 NOTE — Discharge Instructions (Signed)
Please continue to use the eyedrops. Follow-up with eye doctor as scheduled.

## 2015-03-01 ENCOUNTER — Emergency Department (HOSPITAL_COMMUNITY)
Admission: EM | Admit: 2015-03-01 | Discharge: 2015-03-01 | Disposition: A | Discharge status: Home / Self Care | Payer: Self-pay | Attending: Emergency Medicine | Admitting: Emergency Medicine

## 2015-03-01 ENCOUNTER — Encounter (HOSPITAL_COMMUNITY): Payer: Self-pay | Admitting: Emergency Medicine

## 2015-03-01 DIAGNOSIS — Z880 Allergy status to penicillin: Secondary | ICD-10-CM | POA: Insufficient documentation

## 2015-03-01 DIAGNOSIS — E669 Obesity, unspecified: Secondary | ICD-10-CM | POA: Insufficient documentation

## 2015-03-01 DIAGNOSIS — Z23 Encounter for immunization: Secondary | ICD-10-CM | POA: Insufficient documentation

## 2015-03-01 DIAGNOSIS — Z7982 Long term (current) use of aspirin: Secondary | ICD-10-CM | POA: Insufficient documentation

## 2015-03-01 DIAGNOSIS — J45909 Unspecified asthma, uncomplicated: Secondary | ICD-10-CM | POA: Insufficient documentation

## 2015-03-01 DIAGNOSIS — Z720 Tobacco use: Secondary | ICD-10-CM | POA: Insufficient documentation

## 2015-03-01 DIAGNOSIS — N760 Acute vaginitis: Secondary | ICD-10-CM | POA: Insufficient documentation

## 2015-03-01 MED ORDER — HYDROCODONE-ACETAMINOPHEN 5-325 MG PO TABS
1.0000 | ORAL_TABLET | Freq: Once | ORAL | Status: AC
  Administered 2015-03-01: 1 via ORAL
  Filled 2015-03-01: qty 1

## 2015-03-01 MED ORDER — NAPROXEN 500 MG PO TABS: 500.0000 mg | ORAL_TABLET | Freq: Two times a day (BID) | ORAL | Status: DC | PRN

## 2015-03-01 MED ORDER — HYDROCODONE-ACETAMINOPHEN 5-325 MG PO TABS: 1.0000 | ORAL_TABLET | Freq: Four times a day (QID) | ORAL | Status: DC | PRN

## 2015-03-01 MED ORDER — LIDOCAINE-EPINEPHRINE (PF) 2 %-1:200000 IJ SOLN
10.0000 mL | Freq: Once | INTRAMUSCULAR | Status: AC
  Administered 2015-03-01: 10 mL
  Filled 2015-03-01: qty 20

## 2015-03-01 MED ORDER — TETANUS-DIPHTH-ACELL PERTUSSIS 5-2.5-18.5 LF-MCG/0.5 IM SUSP
0.5000 mL | Freq: Once | INTRAMUSCULAR | Status: AC
  Administered 2015-03-01: 0.5 mL via INTRAMUSCULAR
  Filled 2015-03-01: qty 0.5

## 2015-03-01 NOTE — ED Provider Notes (Signed)
CSN: [id number]     Arrival date & time 03/01/15  4098 History   First MD Initiated Contact with Patient 03/01/15 [phone]     Chief Complaint  Patient presents with  . Abscess     (Consider location/radiation/quality/duration/timing/severity/associated sxs/prior Treatment) HPI Comments: Leslie Yates is a 24 y.o. female with a PMHx of asthma and obesity, who presents to the ED with complaints of abscess to the mons pubis x5 days. She reports she has had similar abscesses in this area of the past. She describes the pain in this area is 10/10 constant throbbing and aching, nonradiating, worse with pressure to the area, and unrelieved with heat. She states she shaves, but she denies any known skin injury. She denies any erythema or warmth, red streaking, drainage, fevers, chills, chest pain, shortness breath, abdominal pain, nausea, vomiting, dysuria, hematuria, vaginal bleeding or discharge, numbness, tingling, or weakness. Her last tetanus was over 10 years ago. [facility]. She states she went to the health dept a few days ago and they didn't feel this needed abx or I&D at that time.  Patient is a 25 y.o. female presenting with abscess. The history is provided by the patient. No language interpreter was used.  Abscess Location:  Ano-genital Ano-genital abscess location:  Vagina Abscess quality: painful   Abscess quality: not draining, no redness and no warmth   Red streaking: no   Duration:  5 days Progression:  Unchanged Pain details:    Quality:  Throbbing and aching   Severity:  Severe   Duration:  5 days   Timing:  Constant   Progression:  Unchanged Chronicity:  Recurrent Context: skin injury (shaves, although denies specific skin injury)   Relieved by:  Nothing Exacerbated by: pressure to area. Ineffective treatments:  Warm compresses Associated symptoms: no fever, no nausea and no vomiting   Risk factors: prior abscess     Past Medical History  Diagnosis Date    . Asthma   . Obesity    History reviewed. No pertinent past surgical history. Family History  Problem Relation Age of Onset  . Diabetes Mother   . Hypertension Mother   . Diabetes Father   . Hypertension Father    Social History  Substance Use Topics  . Smoking status: Current Some Day Smoker -- 0.50 packs/day    Types: Cigarettes  . Smokeless tobacco: None  . Alcohol Use: Yes   OB History    No data available     Review of Systems  Constitutional: Negative for fever and chills.  Respiratory: Negative for shortness of breath.   Cardiovascular: Negative for chest pain.  Gastrointestinal: Negative for nausea, vomiting and abdominal pain.  Genitourinary: Positive for genital sores (abscess to mons). Negative for dysuria, hematuria, vaginal bleeding and vaginal discharge.  Musculoskeletal: Negative for myalgias and arthralgias.  Skin: Positive for wound (abscess). Negative for color change.  Allergic/Immunologic: Negative for immunocompromised state.  Neurological: Negative for weakness and numbness.  Psychiatric/Behavioral: Negative for confusion.   10 Systems reviewed and are negative for acute change except as noted in the HPI.    Allergies  Penicillins  Home Medications   Prior to Admission medications   Medication Sig Start Date End Date Taking? Authorizing Provider  aspirin-acetaminophen-caffeine (EXCEDRIN MIGRAINE) [phone] MG per tablet Take 1 tablet by mouth every 6 (six) hours as needed for headache or migraine.    Historical Provider, MD  ibuprofen (ADVIL,MOTRIN) 800 MG tablet Take 1,600 mg by mouth every  8 (eight) hours as needed for headache or mild pain.    Historical Provider, MD  ondansetron (ZOFRAN) 4 MG tablet Take 1-2 tablets (4-8 mg total) by mouth every 8 (eight) hours as needed for nausea or vomiting. 12/19/14   Ozella Rocks, MD  oxyCODONE-acetaminophen (PERCOCET/ROXICET) 5-325 MG per tablet Take 1 tablet by mouth every 4 (four) hours as needed  for severe pain. 01/18/15   Renne Crigler, PA-C   BP 121/69 mmHg  Pulse 50  Temp(Src) 98.2 F (36.8 C) (Oral)  Resp 20  Ht 6' (1.829 m)  Wt 282 lb (127.914 kg)  BMI 38.24 kg/m2  SpO2 100%  LMP 02/23/2015 Physical Exam  Constitutional: She is oriented to person, place, and time. Vital signs are normal. She appears well-developed and well-nourished.  Non-toxic appearance. No distress.  Afebrile, nontoxic, NAD  HENT:  Head: Normocephalic and atraumatic.  Mouth/Throat: Oropharynx is clear and moist and mucous membranes are normal.  Eyes: Conjunctivae and EOM are normal. Right eye exhibits no discharge. Left eye exhibits no discharge.  Neck: Normal range of motion. Neck supple.  Cardiovascular: Normal rate, regular rhythm, normal heart sounds and intact distal pulses.  Exam reveals no gallop and no friction rub.   No murmur heard. HR 50 in triage but during exam HR 70-80s  Pulmonary/Chest: Effort normal and breath sounds normal. No respiratory distress. She has no decreased breath sounds. She has no wheezes. She has no rhonchi. She has no rales.  Abdominal: Soft. Normal appearance and bowel sounds are normal. She exhibits no distension. There is no tenderness. There is no rigidity, no rebound, no guarding, no CVA tenderness, no tenderness at McBurney's point and negative Murphy's sign.  Genitourinary:    Pelvic exam was performed with patient supine.  ~1.5cm abscess to R mons with mild fluctuance, no erythema or warmth, exquisitely TTP. No surrounding cellulitis  Musculoskeletal: Normal range of motion.  Neurological: She is alert and oriented to person, place, and time. She has normal strength. No sensory deficit.  Skin: Skin is warm, dry and intact. No rash noted.  Psychiatric: She has a normal mood and affect.  Nursing note and vitals reviewed.   ED Course  INCISION AND DRAINAGE Date/Time: 03/01/2015 7:47 AM Performed by: Marjean Donna Freda Jaquith Authorized by: Allen Derry Consent: Verbal consent obtained. Risks and benefits: risks, benefits and alternatives were discussed Consent given by: patient Patient understanding: patient states understanding of the procedure being performed Patient consent: the patient's understanding of the procedure matches consent given Patient identity confirmed: verbally with patient Type: abscess Body area: anogenital (mons pubis) Anesthesia: local infiltration Local anesthetic: lidocaine 2% with epinephrine Anesthetic total: 2 ml Patient sedated: no Scalpel size: 11 Needle gauge: 25. Incision type: single straight Incision depth: subcutaneous Complexity: complex (blunt dissection) Drainage: purulent Drainage amount: moderate Wound treatment: wound left open Packing material: 1/4 in iodoform gauze Patient tolerance: Patient tolerated the procedure well with no immediate complications   (including critical care time) Labs Review Labs Reviewed - No data to display  Imaging Review No results found. I have personally reviewed and evaluated these images and lab results as part of my medical decision-making.   EKG Interpretation None      MDM   Final diagnoses:  Abscess, vagina    24 y.o. female here with abscess to mons area. No erythema or warmth, mildly fluctuant. Will I&D after pain meds are given. No tetanus in >2yrs, will give this here. Will reassess shortly.   7:48 AM  I&D successful. Doubt need for abx. Will send home with pain meds and f/up with PCP in 2 days for iodoform gauze removal and recheck. Warm sitz baths discussed. I explained the diagnosis and have given explicit precautions to return to the ER including for any other new or worsening symptoms. The patient understands and accepts the medical plan as it's been dictated and I have answered their questions. Discharge instructions concerning home care and prescriptions have been given. The patient is STABLE and is discharged to home in  good condition.  BP 155/98 mmHg  Pulse 76  Temp(Src) 98.2 F (36.8 C) (Oral)  Resp 20  Ht 6' (1.829 m)  Wt 282 lb (127.914 kg)  BMI 38.24 kg/m2  SpO2 100%  LMP 02/23/2015  Meds ordered this encounter  Medications  . lidocaine-EPINEPHrine (XYLOCAINE W/EPI) 2 %-1:200000 (PF) injection 10 mL    Sig:   . Tdap (BOOSTRIX) injection 0.5 mL    Sig:   . HYDROcodone-acetaminophen (NORCO/VICODIN) 5-325 MG per tablet 1 tablet    Sig:   . naproxen (NAPROSYN) 500 MG tablet    Sig: Take 1 tablet (500 mg total) by mouth 2 (two) times daily as needed for mild pain, moderate pain or headache (TAKE WITH MEALS.).    Dispense:  20 tablet    Refill:  0    Order Specific Question:  Supervising Provider    Answer:  MILLER, BRIAN [[id number]]  . HYDROcodone-acetaminophen (NORCO) 5-325 MG per tablet    Sig: Take 1 tablet by mouth every 6 (six) hours as needed for severe pain.    Dispense:  10 tablet    Refill:  0    Order Specific Question:  Supervising Provider    Answer:  Eber Hong [[id number]]     Kamilah Correia Camprubi-Soms, PA-C 03/01/15 1610  Loren Racer, MD 03/02/15 [phone]

## 2015-03-01 NOTE — Discharge Instructions (Signed)
Keep wound clean and dry. Do not pull on the gauze that was placed inside the abscess today. Apply warm compresses to affected area throughout the day. Perform warm sitz baths at least 2-4 times per day. Take naprosyn and norco as directed, as needed for pain but do not drive or operate machinery with pain medication use. Followup with Primary Care doctor/[facility] in 2 days for wound recheck and packing removal. Monitor area for signs of infection to include, but not limited to: increasing pain, redness, drainage/pus, or swelling. Return to emergency department for emergent changing or worsening symptoms.    Abscess An abscess (boil or furuncle) is an infected area on or under the skin. This area is filled with yellowish-white fluid (pus) and other material (debris). HOME CARE   Only take medicines as told by your doctor.  If you were given antibiotic medicine, take it as directed. Finish the medicine even if you start to feel better.  If gauze is used, follow your doctor's directions for changing the gauze.  To avoid spreading the infection:  Keep your abscess covered with a bandage.  Wash your hands well.  Do not share personal care items, towels, or whirlpools with others.  Avoid skin contact with others.  Keep your skin and clothes clean around the abscess.  Keep all doctor visits as told. GET HELP RIGHT AWAY IF:   You have more pain, puffiness (swelling), or redness in the wound site.  You have more fluid or blood coming from the wound site.  You have muscle aches, chills, or you feel sick.  You have a fever. MAKE SURE YOU:   Understand these instructions.  Will watch your condition.  Will get help right away if you are not doing well or get worse. Document Released: 11/28/2007 Document Revised: 12/11/2011 Document Reviewed: 08/24/2011 [facility] Patient Information 2015 [place], [place]. This information is not intended to replace advice given to you by  your health care provider. Make sure you discuss any questions you have with your health care provider.

## 2015-03-01 NOTE — ED Notes (Signed)
Pt c/o 10/10 pain on her private area secondary to an abscess. No fever reported.

## 2015-03-03 ENCOUNTER — Emergency Department (INDEPENDENT_AMBULATORY_CARE_PROVIDER_SITE_OTHER)
Admission: EM | Admit: 2015-03-03 | Discharge: 2015-03-03 | Disposition: A | Discharge status: Home / Self Care | Payer: Self-pay | Source: Home / Self Care | Attending: Family Medicine | Admitting: Family Medicine

## 2015-03-03 ENCOUNTER — Encounter (HOSPITAL_COMMUNITY): Payer: Self-pay | Admitting: Emergency Medicine

## 2015-03-03 DIAGNOSIS — Z5189 Encounter for other specified aftercare: Secondary | ICD-10-CM

## 2015-03-03 NOTE — ED Provider Notes (Signed)
CSN: [id number]     Arrival date & time 03/03/15  1621 History   First MD Initiated Contact with Patient 03/03/15 1702     Chief Complaint  Patient presents with  . Follow-up   (Consider location/radiation/quality/duration/timing/severity/associated sxs/prior Treatment) HPI Comments: Patient is here for follow-up status post I&D of an abscess to the mons pubis 2 days ago in the emergency department. She states that it is feeling better. There was a short packing straining inserted. She denies fever, chills or other systemic/constitutional symptoms.   Past Medical History  Diagnosis Date  . Asthma   . Obesity    History reviewed. No pertinent past surgical history. Family History  Problem Relation Age of Onset  . Diabetes Mother   . Hypertension Mother   . Diabetes Father   . Hypertension Father    Social History  Substance Use Topics  . Smoking status: Current Some Day Smoker -- 0.50 packs/day    Types: Cigarettes  . Smokeless tobacco: None  . Alcohol Use: Yes   OB History    No data available     Review of Systems  Constitutional: Negative.   Genitourinary: Negative.   Skin: Positive for wound.  All other systems reviewed and are negative.   Allergies  Penicillins  Home Medications   Prior to Admission medications   Medication Sig Start Date End Date Taking? Authorizing Provider  HYDROcodone-acetaminophen (NORCO) 5-325 MG per tablet Take 1 tablet by mouth every 6 (six) hours as needed for severe pain. 03/01/15  Yes Mercedes Camprubi-Soms, PA-C  ibuprofen (ADVIL,MOTRIN) 800 MG tablet Take 1,600 mg by mouth every 8 (eight) hours as needed for headache or mild pain.   Yes Historical Provider, MD  aspirin-acetaminophen-caffeine (EXCEDRIN MIGRAINE) [phone] MG per tablet Take 1 tablet by mouth every 6 (six) hours as needed for headache or migraine.    Historical Provider, MD  naproxen (NAPROSYN) 500 MG tablet Take 1 tablet (500 mg total) by mouth 2 (two) times daily as  needed for mild pain, moderate pain or headache (TAKE WITH MEALS.). 03/01/15   Mercedes Camprubi-Soms, PA-C   Meds Ordered and Administered this Visit  Medications - No data to display  LMP 02/23/2015 No data found.   Physical Exam  Constitutional: She is oriented to person, place, and time. She appears well-developed and well-nourished. No distress.  Eyes: EOM are normal.  Neck: Normal range of motion. Neck supple.  Cardiovascular: Normal rate.   Pulmonary/Chest: Effort normal. No respiratory distress.  Musculoskeletal: She exhibits no edema.  Neurological: She is alert and oriented to person, place, and time. She exhibits normal muscle tone.  Skin: Skin is warm and dry.   The  site of the abscess over the mons pubis for which an I&D was performed is healing well. The packing is removed. There is no drainage. No bleeding. No purulence. No surrounding erythema, swelling or lymphangitis.   Psychiatric: She has a normal mood and affect.  Nursing note and vitals reviewed.   ED Course  Procedures (including critical care time)  Labs Review Labs Reviewed - No data to display  Imaging Review No results found.   Visual Acuity Review  Right Eye Distance:   Left Eye Distance:   Bilateral Distance:    Right Eye Near:   Left Eye Near:    Bilateral Near:         MDM   1. Wound check, abscess    There was approximately 2 cm of packing within the wound.  This was removed. The wound is healing nicely without signs of infection and with no drainage or bleeding. She is given instructions on wound care. She may take a shower and clean the wound with soap and water and make sure there is no water remaining within the incision. For worsening new symptoms or problems may return.    Hayden Rasmussen, NP 03/03/15 2108

## 2015-03-03 NOTE — ED Notes (Signed)
Here for a f/u on abscess on vagina and to have packing removed Seen at [facility] ER on 9/6 Reports she is feeling better; pain is 5/10 Alert... No acute distress; steady gait

## 2015-04-12 ENCOUNTER — Encounter (HOSPITAL_COMMUNITY): Payer: Self-pay | Admitting: Emergency Medicine

## 2015-04-12 ENCOUNTER — Emergency Department (HOSPITAL_COMMUNITY)
Admission: EM | Admit: 2015-04-12 | Discharge: 2015-04-12 | Disposition: A | Discharge status: Home / Self Care | Payer: Self-pay | Attending: Emergency Medicine | Admitting: Emergency Medicine

## 2015-04-12 DIAGNOSIS — R112 Nausea with vomiting, unspecified: Secondary | ICD-10-CM | POA: Insufficient documentation

## 2015-04-12 DIAGNOSIS — J45909 Unspecified asthma, uncomplicated: Secondary | ICD-10-CM | POA: Insufficient documentation

## 2015-04-12 DIAGNOSIS — J069 Acute upper respiratory infection, unspecified: Secondary | ICD-10-CM | POA: Insufficient documentation

## 2015-04-12 DIAGNOSIS — Z720 Tobacco use: Secondary | ICD-10-CM | POA: Insufficient documentation

## 2015-04-12 DIAGNOSIS — E669 Obesity, unspecified: Secondary | ICD-10-CM | POA: Insufficient documentation

## 2015-04-12 DIAGNOSIS — Z880 Allergy status to penicillin: Secondary | ICD-10-CM | POA: Insufficient documentation

## 2015-04-12 DIAGNOSIS — Z7982 Long term (current) use of aspirin: Secondary | ICD-10-CM | POA: Insufficient documentation

## 2015-04-12 DIAGNOSIS — R197 Diarrhea, unspecified: Secondary | ICD-10-CM | POA: Insufficient documentation

## 2015-04-12 DIAGNOSIS — R001 Bradycardia, unspecified: Secondary | ICD-10-CM | POA: Insufficient documentation

## 2015-04-12 LAB — BASIC METABOLIC PANEL
ANION GAP: 9 (ref 5–15)
BUN: 9 mg/dL (ref 6–20)
CALCIUM: 8.7 mg/dL — AB (ref 8.9–10.3)
CHLORIDE: 107 mmol/L (ref 101–111)
CO2: 23 mmol/L (ref 22–32)
CREATININE: 0.85 mg/dL (ref 0.44–1.00)
GFR calc non Af Amer: >60 mL/min (ref >60)
Glucose, Bld: 97 mg/dL (ref 65–99)
Potassium: 3.7 mmol/L (ref 3.5–5.1)
SODIUM: 139 mmol/L (ref 135–145)

## 2015-04-12 LAB — CBC
HCT: 36.5 % (ref 36.0–46.0)
HEMOGLOBIN: 12.8 g/dL (ref 12.0–15.0)
MCH: 30.8 pg (ref 26.0–34.0)
MCHC: 35.1 g/dL (ref 30.0–36.0)
MCV: 88 fL (ref 78.0–100.0)
Platelets: 182 10*3/uL (ref 150–400)
RBC: 4.15 MIL/uL (ref 3.87–5.11)
RDW: 11.7 % (ref 11.5–15.5)
WBC: 7.2 10*3/uL (ref 4.0–10.5)

## 2015-04-12 MED ORDER — ONDANSETRON HCL 4 MG/2ML IJ SOLN
4.0000 mg | Freq: Once | INTRAMUSCULAR | Status: AC
  Administered 2015-04-12: 4 mg via INTRAVENOUS
  Filled 2015-04-12: qty 2

## 2015-04-12 MED ORDER — SODIUM CHLORIDE 0.9 % IV BOLUS (SEPSIS)
1000.0000 mL | Freq: Once | INTRAVENOUS | Status: AC
  Administered 2015-04-12: 1000 mL via INTRAVENOUS

## 2015-04-12 MED ORDER — ONDANSETRON 4 MG PO TBDP: 4.0000 mg | ORAL_TABLET | Freq: Three times a day (TID) | ORAL | Status: DC | PRN

## 2015-04-12 NOTE — ED Notes (Signed)
PT reports she feels better. Will PO challenge.

## 2015-04-12 NOTE — Discharge Instructions (Signed)
Please read and follow all provided instructions.  Your diagnoses today include:  1. Nausea vomiting and diarrhea   2. URI (upper respiratory infection)    Tests performed today include:  Blood counts and electrolytes  Blood tests to check liver and kidney function  Vital signs. See below for your results today.   Medications prescribed:   Zofran (ondansetron) - for nausea and vomiting  Take any prescribed medications only as directed.  Home care instructions:   Follow any educational materials contained in this packet.   Your abdominal pain, nausea, vomiting, and diarrhea may be caused by a viral gastroenteritis also called 'stomach flu'. You should rest for the next several days. Keep drinking plenty of fluids and use the medicine for nausea as directed.    Drink clear liquids for the next 24 hours and introduce solid foods slowly after 24 hours using the b.r.a.t. diet (Bananas, Rice, Applesauce, Toast, Yogurt).    Follow-up instructions: Please follow-up with your primary care provider in the next 3 days for further evaluation of your symptoms. If you are not feeling better in 48 hours you may have a condition that is more serious and you need re-evaluation.   Return instructions:  SEEK IMMEDIATE MEDICAL ATTENTION IF:  If you have pain that does not go away or becomes severe   A temperature above 101F develops   Repeated vomiting occurs (multiple episodes)   If you have pain that becomes localized to portions of the abdomen. The right side could possibly be appendicitis. In an adult, the left lower portion of the abdomen could be colitis or diverticulitis.   Blood is being passed in stools or vomit (bright red or black tarry stools)   You develop chest pain, difficulty breathing, dizziness or fainting, or become confused, poorly responsive, or inconsolable (young children)  If you have any other emergent concerns regarding your health  Additional  Information: Abdominal (belly) pain can be caused by many things. Your caregiver performed an examination and possibly ordered blood/urine tests and imaging (CT scan, x-rays, ultrasound). Many cases can be observed and treated at home after initial evaluation in the emergency department. Even though you are being discharged home, abdominal pain can be unpredictable. Therefore, you need a repeated exam if your pain does not resolve, returns, or worsens. Most patients with abdominal pain don't have to be admitted to the hospital or have surgery, but serious problems like appendicitis and gallbladder attacks can start out as nonspecific pain. Many abdominal conditions cannot be diagnosed in one visit, so follow-up evaluations are very important.  Your vital signs today were: BP 126/62 mmHg   Pulse 62   Temp(Src) 97.8 F (36.6 C) (Oral)   Resp 16   Ht 6\' 1"  (1.854 m)   Wt 278 lb (126.1 kg)   BMI 36.69 kg/m2   SpO2 100%   LMP 03/20/2015 If your blood pressure (bp) was elevated above 135/85 this visit, please have this repeated by your doctor within one month. --------------

## 2015-04-12 NOTE — ED Provider Notes (Signed)
CSN: [id number]     Arrival date & time 04/12/15  1049 History   First MD Initiated Contact with Patient 04/12/15 1052     Chief Complaint  Patient presents with  . Nasal Congestion  . Emesis  . Diarrhea    (Consider location/radiation/quality/duration/timing/severity/associated sxs/prior Treatment) HPI Comments: Patient presents with complaint of nasal congestion, sinus pressure, nausea, vomiting, and diarrhea. Symptoms started 3 days ago with nasal congestion and stuffy nose. 2 days ago the patient developed multiple episodes of vomiting, multiple episodes of nonbloody, watery diarrhea. She has had intermittent fever at home. She has been taking Mucinex without much relief. She denies chest pain, shortness of breath, cough, abdominal pain, urinary symptoms. No known sick contacts. She has been eating soup and drinking water. No travel or suspicious food or water exposures. The onset of this condition was acute. The course is constant. Aggravating factors: none. Alleviating factors: none.    Patient is a 24 y.o. female presenting with vomiting and diarrhea. The history is provided by the patient.  Emesis Associated symptoms: chills and diarrhea   Associated symptoms: no abdominal pain, no headaches, no myalgias and no sore throat   Diarrhea Associated symptoms: chills, diaphoresis, fever and vomiting   Associated symptoms: no abdominal pain, no headaches and no myalgias     Past Medical History  Diagnosis Date  . Asthma   . Obesity    No past surgical history on file. Family History  Problem Relation Age of Onset  . Diabetes Mother   . Hypertension Mother   . Diabetes Father   . Hypertension Father    Social History  Substance Use Topics  . Smoking status: Current Some Day Smoker -- 0.50 packs/day    Types: Cigarettes  . Smokeless tobacco: None  . Alcohol Use: Yes   OB History    No data available     Review of Systems  Constitutional: Positive for fever, chills,  diaphoresis, appetite change and fatigue.  HENT: Positive for congestion and sinus pressure. Negative for rhinorrhea and sore throat.   Eyes: Negative for redness.  Respiratory: Negative for cough and shortness of breath.   Cardiovascular: Negative for chest pain.  Gastrointestinal: Positive for nausea, vomiting and diarrhea. Negative for abdominal pain and blood in stool.  Genitourinary: Negative for dysuria.  Musculoskeletal: Negative for myalgias.  Skin: Negative for rash.  Neurological: Negative for headaches.    Allergies  Penicillins  Home Medications   Prior to Admission medications   Medication Sig Start Date End Date Taking? Authorizing Provider  aspirin-acetaminophen-caffeine (EXCEDRIN MIGRAINE) [phone] MG per tablet Take 1 tablet by mouth every 6 (six) hours as needed for headache or migraine.    Historical Provider, MD  HYDROcodone-acetaminophen (NORCO) 5-325 MG per tablet Take 1 tablet by mouth every 6 (six) hours as needed for severe pain. 03/01/15   Mercedes Camprubi-Soms, PA-C  ibuprofen (ADVIL,MOTRIN) 800 MG tablet Take 1,600 mg by mouth every 8 (eight) hours as needed for headache or mild pain.    Historical Provider, MD  naproxen (NAPROSYN) 500 MG tablet Take 1 tablet (500 mg total) by mouth 2 (two) times daily as needed for mild pain, moderate pain or headache (TAKE WITH MEALS.). 03/01/15   Mercedes Camprubi-Soms, PA-C   BP 126/62 mmHg  Pulse 62  Temp(Src) 97.8 F (36.6 C) (Oral)  Resp 16  Ht  (1.854 m)  Wt 278 lb (126.1 kg)  BMI 36.69 kg/m2  SpO2 100%  LMP 03/20/2015   Physical  Exam  Constitutional: She appears well-developed and well-nourished.  HENT:  Head: Normocephalic and atraumatic.  Right Ear: Tympanic membrane, external ear and ear canal normal.  Left Ear: Tympanic membrane, external ear and ear canal normal.  Nose: Nose normal. No mucosal edema or rhinorrhea.  Mouth/Throat: Uvula is midline, oropharynx is clear and moist and mucous membranes  are normal. Mucous membranes are not dry. No oral lesions. No trismus in the jaw. No uvula swelling. No oropharyngeal exudate, posterior oropharyngeal edema, posterior oropharyngeal erythema or tonsillar abscesses.  Eyes: Conjunctivae are normal. Right eye exhibits no discharge. Left eye exhibits no discharge.  Neck: Normal range of motion. Neck supple.  Cardiovascular: Regular rhythm and normal heart sounds.  Bradycardia present.   Pulmonary/Chest: Effort normal and breath sounds normal. No respiratory distress. She has no wheezes. She has no rales.  Abdominal: Soft. There is no tenderness.  Lymphadenopathy:    She has no cervical adenopathy.  Neurological: She is alert.  Skin: Skin is warm and dry.  Psychiatric: She has a normal mood and affect.  Nursing note and vitals reviewed.   ED Course  Procedures (including critical care time) Labs Review Labs Reviewed  BASIC METABOLIC PANEL - Abnormal; Notable for the following:    Calcium 8.7 (*)    All other components within normal limits  CBC    Imaging Review No results found. I have personally reviewed and evaluated these images and lab results as part of my medical decision-making.   EKG Interpretation None      11:24 AM Patient seen and examined. Work-up initiated. Medications ordered.   Vital signs reviewed and are as follows: BP 126/62 mmHg  Pulse 62  Temp(Src) 97.8 F (36.6 C) (Oral)  Resp 16  Ht 6\' 1"  (1.854 m)  Wt 278 lb (126.1 kg)  BMI 36.69 kg/m2  SpO2 100%  LMP 03/20/2015   Patient feels much better after fluids and antiemetics. She is eating crackers and drinking soda in the room. Will discharge to home with ODT Zofran. Patient is bradycardic but states that this is typical for her. She is on no beta blockers.   MDM   Final diagnoses:  Nausea vomiting and diarrhea  URI (upper respiratory infection)   Patient with N/V/D and URI sx. Vitals are stable, no fever.  No signs of dehydration, tolerating PO's.  Lungs are clear. No focal abdominal pain, no concern for appendicitis, cholecystitis, pancreatitis, ruptured viscus, UTI, kidney stone, or any other emergent abdominal etiology. Supportive therapy indicated with return if symptoms worsen. Patient counseled.    Renne CriglerJoshua Dayn Barich, PA-C 04/12/15 2025  Melene Planan Floyd, DO 04/13/15 [phone]

## 2015-04-12 NOTE — ED Notes (Signed)
Pt reports new onset vomiting, diarrhea since Saturday with decreased appetitite. Pt was able to keep down some soup last night and some water. Pt has not eaten today but had some water this morning she was able to keep down. Pt has had emesis X 4 today, diarrhea X 7 with brown loose watery stools. Pt reports fever. Pt is afebrile at present. Pt denies taking any medications today other than mucinex for congestion that she has also had since Saturday.

## 2015-05-26 ENCOUNTER — Encounter (HOSPITAL_COMMUNITY): Payer: Self-pay | Admitting: Emergency Medicine

## 2015-05-26 ENCOUNTER — Emergency Department (HOSPITAL_COMMUNITY)
Admission: EM | Admit: 2015-05-26 | Discharge: 2015-05-26 | Disposition: A | Discharge status: Home / Self Care | Payer: Self-pay | Attending: Emergency Medicine | Admitting: Emergency Medicine

## 2015-05-26 DIAGNOSIS — J45909 Unspecified asthma, uncomplicated: Secondary | ICD-10-CM | POA: Insufficient documentation

## 2015-05-26 DIAGNOSIS — R112 Nausea with vomiting, unspecified: Secondary | ICD-10-CM | POA: Insufficient documentation

## 2015-05-26 DIAGNOSIS — R197 Diarrhea, unspecified: Secondary | ICD-10-CM | POA: Insufficient documentation

## 2015-05-26 DIAGNOSIS — E669 Obesity, unspecified: Secondary | ICD-10-CM | POA: Insufficient documentation

## 2015-05-26 DIAGNOSIS — Z880 Allergy status to penicillin: Secondary | ICD-10-CM | POA: Insufficient documentation

## 2015-05-26 DIAGNOSIS — F17210 Nicotine dependence, cigarettes, uncomplicated: Secondary | ICD-10-CM | POA: Insufficient documentation

## 2015-05-26 LAB — COMPREHENSIVE METABOLIC PANEL
ALT: 13 U/L — ABNORMAL LOW (ref 14–54)
ANION GAP: 10 (ref 5–15)
AST: 20 U/L (ref 15–41)
Albumin: 3.9 g/dL (ref 3.5–5.0)
Alkaline Phosphatase: 39 U/L (ref 38–126)
BILIRUBIN TOTAL: 0.3 mg/dL (ref 0.3–1.2)
BUN: 10 mg/dL (ref 6–20)
CO2: 23 mmol/L (ref 22–32)
Calcium: 9 mg/dL (ref 8.9–10.3)
Chloride: 108 mmol/L (ref 101–111)
Creatinine, Ser: 0.8 mg/dL (ref 0.44–1.00)
GFR calc Af Amer: >60 mL/min (ref >60)
Glucose, Bld: 135 mg/dL — ABNORMAL HIGH (ref 65–99)
POTASSIUM: 3 mmol/L — AB (ref 3.5–5.1)
Sodium: 141 mmol/L (ref 135–145)
TOTAL PROTEIN: 6.8 g/dL (ref 6.5–8.1)

## 2015-05-26 LAB — LIPASE, BLOOD: Lipase: 30 U/L (ref 11–51)

## 2015-05-26 LAB — CBC WITH DIFFERENTIAL/PLATELET
Basophils Absolute: 0 10*3/uL (ref 0.0–0.1)
Basophils Relative: 0 %
EOS PCT: 0 %
Eosinophils Absolute: 0 10*3/uL (ref 0.0–0.7)
HCT: 39.6 % (ref 36.0–46.0)
Hemoglobin: 13.9 g/dL (ref 12.0–15.0)
LYMPHS ABS: 1.3 10*3/uL (ref 0.7–4.0)
LYMPHS PCT: 12 %
MCH: 30.8 pg (ref 26.0–34.0)
MCHC: 35.1 g/dL (ref 30.0–36.0)
MCV: 87.6 fL (ref 78.0–100.0)
MONO ABS: 0.3 10*3/uL (ref 0.1–1.0)
Monocytes Relative: 3 %
Neutro Abs: 9.4 10*3/uL — ABNORMAL HIGH (ref 1.7–7.7)
Neutrophils Relative %: 85 %
PLATELETS: 199 10*3/uL (ref 150–400)
RBC: 4.52 MIL/uL (ref 3.87–5.11)
RDW: 11.8 % (ref 11.5–15.5)
WBC: 11 10*3/uL — ABNORMAL HIGH (ref 4.0–10.5)

## 2015-05-26 LAB — CBG MONITORING, ED: Glucose-Capillary: 113 mg/dL — ABNORMAL HIGH (ref 65–99)

## 2015-05-26 MED ORDER — ONDANSETRON HCL 4 MG/2ML IJ SOLN
4.0000 mg | Freq: Once | INTRAMUSCULAR | Status: AC
  Administered 2015-05-26: 4 mg via INTRAVENOUS
  Filled 2015-05-26: qty 2

## 2015-05-26 MED ORDER — ACETAMINOPHEN 500 MG PO TABS: 1000.0000 mg | ORAL_TABLET | Freq: Once | ORAL | Status: DC

## 2015-05-26 MED ORDER — SODIUM CHLORIDE 0.9 % IV BOLUS (SEPSIS)
1000.0000 mL | Freq: Once | INTRAVENOUS | Status: AC
  Administered 2015-05-26: 1000 mL via INTRAVENOUS

## 2015-05-26 MED ORDER — METOCLOPRAMIDE HCL 5 MG/ML IJ SOLN
10.0000 mg | Freq: Once | INTRAMUSCULAR | Status: AC
  Administered 2015-05-26: 10 mg via INTRAVENOUS
  Filled 2015-05-26: qty 2

## 2015-05-26 MED ORDER — ONDANSETRON 4 MG PO TBDP
4.0000 mg | ORAL_TABLET | Freq: Once | ORAL | Status: AC
  Administered 2015-05-26: 4 mg via ORAL
  Filled 2015-05-26: qty 1

## 2015-05-26 MED ORDER — KETOROLAC TROMETHAMINE 30 MG/ML IJ SOLN
30.0000 mg | Freq: Once | INTRAMUSCULAR | Status: AC
  Administered 2015-05-26: 30 mg via INTRAVENOUS
  Filled 2015-05-26: qty 1

## 2015-05-26 NOTE — ED Provider Notes (Signed)
CSN: [id number]     Arrival date & time 05/26/15  4098 History   First MD Initiated Contact with Patient 05/26/15 [phone]     Chief Complaint  Patient presents with  . Emesis  . Diarrhea     (Consider location/radiation/quality/duration/timing/severity/associated sxs/prior Treatment) Patient is a 24 y.o. female presenting with vomiting and diarrhea. The history is provided by the patient.  Emesis Severity:  Moderate Duration:  6 hours Timing:  Constant Number of daily episodes:  10 Able to tolerate:  Liquids Progression:  Worsening Chronicity:  New Recent urination:  Normal Relieved by:  Nothing Worsened by:  Nothing tried Ineffective treatments:  None tried Associated symptoms: diarrhea   Associated symptoms: no fever   Risk factors: alcohol use and suspect food intake (fast food, store bought)   Risk factors: no diabetes and not pregnant now   Diarrhea Quality:  Semi-solid Severity:  Moderate Onset quality:  Sudden Timing:  Constant Associated symptoms: vomiting     Past Medical History  Diagnosis Date  . Asthma   . Obesity    History reviewed. No pertinent past surgical history. Family History  Problem Relation Age of Onset  . Diabetes Mother   . Hypertension Mother   . Diabetes Father   . Hypertension Father    Social History  Substance Use Topics  . Smoking status: Current Some Day Smoker -- 0.50 packs/day    Types: Cigarettes  . Smokeless tobacco: None  . Alcohol Use: Yes   OB History    No data available     Review of Systems  Gastrointestinal: Positive for vomiting and diarrhea.  All other systems reviewed and are negative.     Allergies  Penicillins  Home Medications   Prior to Admission medications   Medication Sig Start Date End Date Taking? Authorizing Provider  ibuprofen (ADVIL,MOTRIN) 200 MG tablet Take 200 mg by mouth every 6 (six) hours as needed for headache or mild pain.   Yes Historical Provider, MD   BP 120/59 mmHg  Pulse  53  Temp(Src) 98.2 F (36.8 C) (Oral)  Resp 18  SpO2 100% Physical Exam  Constitutional: She is oriented to person, place, and time. She appears well-developed and well-nourished. No distress.  HENT:  Head: Normocephalic.  Eyes: Conjunctivae are normal.  Neck: Neck supple. No tracheal deviation present.  Cardiovascular: Normal rate, regular rhythm and normal heart sounds.   Pulmonary/Chest: Effort normal and breath sounds normal. No respiratory distress.  Abdominal: Soft. She exhibits no distension. There is no tenderness. There is no rebound and no guarding.  Neurological: She is alert and oriented to person, place, and time.  Skin: Skin is warm and dry.  Psychiatric: She has a normal mood and affect.    ED Course  Procedures (including critical care time) Labs Review Labs Reviewed  CBC WITH DIFFERENTIAL/PLATELET - Abnormal; Notable for the following:    WBC 11.0 (*)    Neutro Abs 9.4 (*)    All other components within normal limits  COMPREHENSIVE METABOLIC PANEL - Abnormal; Notable for the following:    Potassium 3.0 (*)    Glucose, Bld 135 (*)    ALT 13 (*)    All other components within normal limits  CBG MONITORING, ED - Abnormal; Notable for the following:    Glucose-Capillary 113 (*)    All other components within normal limits  LIPASE, BLOOD    Imaging Review No results found. I have personally reviewed and evaluated these images and lab results  as part of my medical decision-making.   EKG Interpretation None      MDM   Final diagnoses:  Nausea vomiting and diarrhea    Patient presents with symptoms c/w a viral gastroenteritis vs food poisoning (vomiting, diarrhea) starting overnight. Ate fast food twice and a pre-made quesadilla from grocery store last night. Patient appears well. No signs of toxicity, patient is interactive. Not in distress. No signs of clinical dehydration. Doubt appendicitis, and no evidence of any other illness. Given IVF and  antiemetics as well as toradol with resolution, labs reassuring with mild leukocytosis likely d/t vomiting. Mild intermittent bradycardia likely d/t vagal stimulation. Patient needs to establish primary care in the area and was provided contact information to do so.     Lyndal Pulleyaniel Trypp Heckmann, MD 05/26/15 [phone]

## 2015-05-26 NOTE — ED Notes (Signed)
Dr. Clydene PughKnott made aware of HR 40's.  No new orders received.

## 2015-05-26 NOTE — ED Notes (Signed)
Pt CBG, 113.

## 2015-05-26 NOTE — Discharge Instructions (Signed)

## 2015-05-26 NOTE — ED Notes (Signed)
Patient arrived via [facility]. EMS reports patient awakened approx 0500 with nausea/vomiting/diarrhea, as well as epigastric pain. No fever. BP 120/74, Pulse 98, Resp 16. Rates abdominal pain 10/10.

## 2015-06-27 ENCOUNTER — Ambulatory Visit (INDEPENDENT_AMBULATORY_CARE_PROVIDER_SITE_OTHER): Payer: Worker's Compensation | Admitting: Physician Assistant

## 2015-06-27 DIAGNOSIS — S61219A Laceration without foreign body of unspecified finger without damage to nail, initial encounter: Secondary | ICD-10-CM

## 2015-06-27 DIAGNOSIS — S61213A Laceration without foreign body of left middle finger without damage to nail, initial encounter: Secondary | ICD-10-CM | POA: Diagnosis not present

## 2015-06-27 MED ORDER — DOXYCYCLINE HYCLATE 100 MG PO CAPS: 100.0000 mg | ORAL_CAPSULE | Freq: Two times a day (BID) | ORAL | Status: AC

## 2015-06-27 NOTE — Progress Notes (Deleted)
[facility] [address], [place] [id number] [phone]  Date:  06/27/2015   Name:  Leslie Yates   DOB:  1991-06-11   MRN:  [id number]  PCP:  No PCP Per Patient    History of Present Illness:  Leslie Yates is a 25 y.o. female patient who [facility]     There are no active problems to display for this patient.   Past Medical History  Diagnosis Date  . Asthma   . Obesity     No past surgical history on file.  Social History  Substance Use Topics  . Smoking status: Current Some Day Smoker -- 0.50 packs/day    Types: Cigarettes  . Smokeless tobacco: Not on file  . Alcohol Use: Yes    Family History  Problem Relation Age of Onset  . Diabetes Mother   . Hypertension Mother   . Diabetes Father   . Hypertension Father     Allergies  Allergen Reactions  . Penicillins Rash    Childhood rash Has patient had a PCN reaction causing immediate rash, facial/tongue/throat swelling, SOB or lightheadedness with hypotension: no Has patient had a PCN reaction causing severe rash involving mucus membranes or skin necrosis:no Has patient had a PCN reaction that required hospitalization no Has patient had a PCN reaction occurring within the last 10 years: no If all of the above answers are "NO", then may proceed with Cephalosporin use.     Medication list has been reviewed and updated.  Current Outpatient Prescriptions on File Prior to Visit  Medication Sig Dispense Refill  . ibuprofen (ADVIL,MOTRIN) 200 MG tablet Take 200 mg by mouth every 6 (six) hours as needed for headache or mild pain.     No current facility-administered medications on file prior to visit.    ROS   Physical Examination: There were no vitals taken for this visit. Ideal Body Weight:    Physical Exam   Assessment and Plan: Leslie Yates is a 25 y.o. female who is here today  There are no diagnoses linked to this encounter.  Trena PlattStephanie English, PA-C [facility] 06/27/2015 11:21 AM

## 2015-06-27 NOTE — Patient Instructions (Signed)
Let's follow up in 2 days.  Laceration Care, Adult A laceration is a cut that goes through all of the layers of the skin and into the tissue that is right under the skin. Some lacerations heal on their own. Others need to be closed with stitches (sutures), staples, skin adhesive strips, or skin glue. Proper laceration care minimizes the risk of infection and helps the laceration to heal better. HOW TO CARE FOR YOUR LACERATION If sutures or staples were used:  Keep the wound clean and dry.  If you were given a bandage (dressing), you should change it at least one time per day or as told by your health care provider. You should also change it if it becomes wet or dirty.  Keep the wound completely dry for the first 24 hours or as told by your health care provider. After that time, you may shower or bathe. However, make sure that the wound is not soaked in water until after the sutures or staples have been removed.  Clean the wound one time each day or as told by your health care provider:  Wash the wound with soap and water.  Rinse the wound with water to remove all soap.  Pat the wound dry with a clean towel. Do not rub the wound.  After cleaning the wound, apply a thin layer of antibiotic ointmentas told by your health care provider. This will help to prevent infection and keep the dressing from sticking to the wound.  Have the sutures or staples removed as told by your health care provider. If skin adhesive strips were used:  Keep the wound clean and dry.  If you were given a bandage (dressing), you should change it at least one time per day or as told by your health care provider. You should also change it if it becomes dirty or wet.  Do not get the skin adhesive strips wet. You may shower or bathe, but be careful to keep the wound dry.  If the wound gets wet, pat it dry with a clean towel. Do not rub the wound.  Skin adhesive strips fall off on their own. You may trim the strips  as the wound heals. Do not remove skin adhesive strips that are still stuck to the wound. They will fall off in time. If skin glue was used:  Try to keep the wound dry, but you may briefly wet it in the shower or bath. Do not soak the wound in water, such as by swimming.  After you have showered or bathed, gently pat the wound dry with a clean towel. Do not rub the wound.  Do not do any activities that will make you sweat heavily until the skin glue has fallen off on its own.  Do not apply liquid, cream, or ointment medicine to the wound while the skin glue is in place. Using those may loosen the film before the wound has healed.  If you were given a bandage (dressing), you should change it at least one time per day or as told by your health care provider. You should also change it if it becomes dirty or wet.  If a dressing is placed over the wound, be careful not to apply tape directly over the skin glue. Doing that may cause the glue to be pulled off before the wound has healed.  Do not pick at the glue. The skin glue usually remains in place for 5-10 days, then it falls off of the skin.  General Instructions  Take over-the-counter and prescription medicines only as told by your health care provider.  If you were prescribed an antibiotic medicine or ointment, take or apply it as told by your doctor. Do not stop using it even if your condition improves.  To help prevent scarring, make sure to cover your wound with sunscreen whenever you are outside after stitches are removed, after adhesive strips are removed, or when glue remains in place and the wound is healed. Make sure to wear a sunscreen of at least 30 SPF.  Do not scratch or pick at the wound.  Keep all follow-up visits as told by your health care provider. This is important.  Check your wound every day for signs of infection. Watch for:  Redness, swelling, or pain.  Fluid, blood, or pus.  Raise (elevate) the injured area  above the level of your heart while you are sitting or lying down, if possible. SEEK MEDICAL CARE IF:  You received a tetanus shot and you have swelling, severe pain, redness, or bleeding at the injection site.  You have a fever.  A wound that was closed breaks open.  You notice a bad smell coming from your wound or your dressing.  You notice something coming out of the wound, such as wood or glass.  Your pain is not controlled with medicine.  You have increased redness, swelling, or pain at the site of your wound.  You have fluid, blood, or pus coming from your wound.  You notice a change in the color of your skin near your wound.  You need to change the dressing frequently due to fluid, blood, or pus draining from the wound.  You develop a new rash.  You develop numbness around the wound. SEEK IMMEDIATE MEDICAL CARE IF:  You develop severe swelling around the wound.  Your pain suddenly increases and is severe.  You develop painful lumps near the wound or on skin that is anywhere on your body.  You have a red streak going away from your wound.  The wound is on your hand or foot and you cannot properly move a finger or toe.  The wound is on your hand or foot and you notice that your fingers or toes look pale or bluish.   This information is not intended to replace advice given to you by your health care provider. Make sure you discuss any questions you have with your health care provider.   Document Released: 06/11/2005 Document Revised: 10/26/2014 Document Reviewed: 06/07/2014 Elsevier Interactive Patient Education [employer]

## 2015-06-29 ENCOUNTER — Ambulatory Visit (INDEPENDENT_AMBULATORY_CARE_PROVIDER_SITE_OTHER): Payer: Worker's Compensation | Admitting: Physician Assistant

## 2015-06-29 VITALS — BP 126/72 | HR 60 | Temp 97.9°F | Resp 17 | Ht 71.0 in | Wt 271.0 lb

## 2015-06-29 DIAGNOSIS — S61219D Laceration without foreign body of unspecified finger without damage to nail, subsequent encounter: Secondary | ICD-10-CM

## 2015-06-29 NOTE — Progress Notes (Signed)
Chief Complaint  Patient presents with  . Follow-up    finger injury     History of Present Illness: Patient presents for wound check after foil graft placed on finger wound 2 days ago. She is washing it daily with soap and water as instructed and rebandaging.  No increasing pain. No swelling. No drainage..   Allergies  Allergen Reactions  . Penicillins Rash    Childhood rash Has patient had a PCN reaction causing immediate rash, facial/tongue/throat swelling, SOB or lightheadedness with hypotension: no Has patient had a PCN reaction causing severe rash involving mucus membranes or skin necrosis:no Has patient had a PCN reaction that required hospitalization no Has patient had a PCN reaction occurring within the last 10 years: no If all of the above answers are "NO", then may proceed with Cephalosporin use.     Prior to Admission medications   Medication Sig Start Date End Date Taking? Authorizing Provider  doxycycline (VIBRAMYCIN) 100 MG capsule Take 1 capsule (100 mg total) by mouth 2 (two) times daily. 06/27/15 07/04/15 Yes Stephanie D English, PA  ibuprofen (ADVIL,MOTRIN) 200 MG tablet Take 200 mg by mouth every 6 (six) hours as needed for headache or mild pain.   Yes Historical Provider, MD    There are no active problems to display for this patient.    Physical Exam  Constitutional: She is oriented to person, place, and time. She appears well-developed and well-nourished. She is active and cooperative. No distress.  BP 126/72 mmHg  Pulse 60  Temp(Src) 97.9 F (36.6 C) (Oral)  Resp 17  Ht 5\' 11"  (1.803 m)  Wt 271 lb (122.925 kg)  BMI 37.81 kg/m2  SpO2 98%  LMP 06/19/2015   Eyes: Conjunctivae are normal.  Pulmonary/Chest: Effort normal.  Neurological: She is alert and oriented to person, place, and time.  Skin: Skin is warm and dry.  Foil graft on distal end of LEFT thumb. No erythema. No edema. Tender on palpation. No induration. No drainage. Re-bandaged.   Psychiatric: She has a normal mood and affect. Her speech is normal and behavior is normal.      ASSESSMENT & PLAN:  1. Finger laceration, subsequent encounter Local wound care. Anticipatory guidance provided. RTC in 8 days for suture removal and graft removal.   Fernande Brashelle S. Fidencia Mccloud, PA-C Physician Assistant-Certified [facility]

## 2015-06-30 ENCOUNTER — Encounter: Payer: Self-pay | Admitting: Physician Assistant

## 2015-06-30 ENCOUNTER — Telehealth: Payer: Self-pay | Admitting: Physician Assistant

## 2015-06-30 NOTE — Progress Notes (Addendum)
Leslie Yates 11-20-1990 25 y.o.   No chief complaint on file.    Date of Injury: 06/27/2015  History of Present Illness:  Presents for evaluation of work-related complaint.  Patient is here today, 06/27/2015 after cutting her finger with a knife while at work at [employer].  She states that it has, bled.  She attempted to rinse with water, but the pain was so intense--she stopped.  She wrapped it with paper towels and showed here.  There is minimal numbness though she states that she is very anxious at this time.  Non-dominant hand.  ROS ROS otherwise unremarkable unless listed above   Allergies  Allergen Reactions  . Penicillins Rash    Childhood rash Has patient had a PCN reaction causing immediate rash, facial/tongue/throat swelling, SOB or lightheadedness with hypotension: no Has patient had a PCN reaction causing severe rash involving mucus membranes or skin necrosis:no Has patient had a PCN reaction that required hospitalization no Has patient had a PCN reaction occurring within the last 10 years: no If all of the above answers are "NO", then may proceed with Cephalosporin use.      Current medications reviewed and updated. Past medical history, family history, social history have been reviewed and updated.   Physical Exam  Constitutional: She is oriented to person, place, and time and well-developed, well-nourished, and in no distress. No distress.  HENT:  Head: Normocephalic and atraumatic.  Cardiovascular: Normal rate.   Pulmonary/Chest: Effort normal. No respiratory distress.  Musculoskeletal:  Left middle finger with shaved laceration with complete dermal layer removed.  Circular 1cm in diameter, at the distal end of plantar side.  There is minimal pain upon palpation.  Normal range of motion.  Normal capillary refill.  Neurological: She is alert and oriented to person, place, and time.  Skin: She is not diaphoretic.  Psychiatric: Mood and affect normal.     Procedure: verbal consent obtained.  Alcohol swab at injection site for left middle finger digital block.  Digital block placed with 1% lidocaine, and localized injection made to complete anesthesia.  5-0 Ethilon used to stitch sterile foil onto the 1cm diameter wound in simple interrupted sutures.  Assessment and Plan: 25 year old female is here today for laceration.   -she believes that she is able to work.  As this can be well protected and waterproofed--she is able to with waterproof gloves, and bandaging around this non-dominant hand.   Advised no submersion in liquids, or soiled products.   Wash twice per day Placed on doxy--allergy to penicillin with cephalosporin naive rtc in 2 days for recheck of infection, then follow up in additional 8 days for suture removal.  Finger laceration, initial encounter - Plan: doxycycline (VIBRAMYCIN) 100 MG capsule  Leslie PlattStephanie Jullisa Grigoryan, PA-C [facility] 1/5/20172:58 PM

## 2015-06-30 NOTE — Telephone Encounter (Signed)
On your exam do you think she has to be on restrictions?

## 2015-06-30 NOTE — Telephone Encounter (Signed)
Patient was seen for worker comp visit and her employer needs a note stating that she has no restrictions. Patient will call back with the fax number to send this.   [phone]

## 2015-06-30 NOTE — Telephone Encounter (Signed)
The only restriction is to cover the wound when she is handling food or during work related duties where her finger may get wet (washing dishes, cleaning workspaces/tables with wet dish cloth, etc).

## 2015-07-01 NOTE — Telephone Encounter (Signed)
Attempted to call pt to get fax number to send note to. Left VM asking to call back with fax number.

## 2015-07-08 ENCOUNTER — Ambulatory Visit (INDEPENDENT_AMBULATORY_CARE_PROVIDER_SITE_OTHER): Payer: Worker's Compensation | Admitting: Physician Assistant

## 2015-07-08 VITALS — BP 124/82 | HR 50 | Temp 98.1°F | Resp 18 | Ht 71.0 in | Wt 271.0 lb

## 2015-07-08 DIAGNOSIS — Z4802 Encounter for removal of sutures: Secondary | ICD-10-CM

## 2015-07-08 NOTE — Progress Notes (Signed)
07/08/2015 10:54 AM   DOB: [birth year] / MRN: [id number]  SUBJECTIVE:  Leslie Yates is a 25 y.o. female presenting for suture removal.  She has a foil graft and this was placed on 06/30/15.  She reports no problems with the injury or repair.  Was placed on doxycycline and has finished this.     Review of Systems  Constitutional: Negative for fever.  Gastrointestinal: Negative for nausea.  Neurological: Negative for dizziness.    Problem list and medications reviewed and updated by myself where necessary, and exist elsewhere in the encounter.   OBJECTIVE:  BP 124/82 mmHg  Pulse 50  Temp(Src) 98.1 F (36.7 C) (Oral)  Resp 18  Ht 5\' 11"  (1.803 m)  Wt 271 lb (122.925 kg)  BMI 37.81 kg/m2  SpO2 98%  LMP 06/19/2015  Physical Exam  Constitutional: She is oriented to person, place, and time. She appears well-developed.  Eyes: EOM are normal. Pupils are equal, round, and reactive to light.  Cardiovascular: Normal rate.   Pulmonary/Chest: Effort normal.  Abdominal: She exhibits no distension.  Musculoskeletal: Normal range of motion.       Hands: Neurological: She is alert and oriented to person, place, and time. No cranial nerve deficit.  Skin: Skin is warm and dry. She is not diaphoretic.  Psychiatric: She has a normal mood and affect.  Vitals reviewed.   No results found for this or any previous visit (from the past 48 hour(s)).  ASSESSMENT AND PLAN  Leslie Yates was seen today for suture / staple removal.  Diagnoses and all orders for this visit:  Visit for suture removal: Wound is healing.  Advised that she keep this covered and moist with Vaseline.  Neosporin as needed.  No need for follow up.     The patient was advised to call or return to clinic if she does not see an improvement in symptoms or to seek the care of the closest emergency department if she worsens with the above plan.   Deliah Boston, MHS, PA-C [facility] 07/08/2015 10:54 AM

## 2015-07-14 NOTE — Progress Notes (Addendum)
DANEEN VOLCY 1990/10/28 25 y.o.   No chief complaint on file.    Date of Injury: 06/27/2015  History of Present Illness:  Presents for evaluation of work-related complaint.  Patient is here today, 06/27/2015 after cutting her finger with a knife while at work at [employer].  She states that it has, bled.  She attempted to rinse with water, but the pain was so intense--she stopped.  She wrapped it with paper towels and showed here.  There is minimal numbness though she states that she is very anxious at this time.  Non-dominant hand.  ROS ROS otherwise unremarkable unless listed above   Allergies  Allergen Reactions  . Penicillins Rash    Childhood rash Has patient had a PCN reaction causing immediate rash, facial/tongue/throat swelling, SOB or lightheadedness with hypotension: no Has patient had a PCN reaction causing severe rash involving mucus membranes or skin necrosis:no Has patient had a PCN reaction that required hospitalization no Has patient had a PCN reaction occurring within the last 10 years: no If all of the above answers are "NO", then may proceed with Cephalosporin use.      Current medications reviewed and updated. Past medical history, family history, social history have been reviewed and updated.   Physical Exam  Constitutional: She is oriented to person, place, and time and well-developed, well-nourished, and in no distress. No distress.  HENT:  Head: Normocephalic and atraumatic.  Cardiovascular: Normal rate.   Pulmonary/Chest: Effort normal. No respiratory distress.  Musculoskeletal:  Left middle finger with shaved laceration with complete dermal layer removed.  Circular 1cm in diameter, at the distal end of plantar side.  There is minimal pain upon palpation.  Normal range of motion.  Normal capillary refill.  Neurological: She is alert and oriented to person, place, and time.  Skin: She is not diaphoretic.  Psychiatric: Mood and affect normal.     Procedure: verbal consent obtained.  Alcohol swab at injection site for left middle finger digital block.  Digital block placed with 1% lidocaine, and localized injection made to complete anesthesia.  5-0 Ethilon used to stitch sterile foil onto the 1cm diameter wound in simple interrupted sutures.  Assessment and Plan: 25 year old female is here today for laceration.   -she believes that she is able to work.  As this can be well protected and waterproofed--she is able to with waterproof gloves, and bandaging around this non-dominant hand.   Advised no submersion in liquids, or soiled products.   Wash twice per day Placed on doxy--allergy to penicillin with cephalosporin naive rtc in 2 days for recheck of infection, then follow up in additional 8 days for suture removal.  Finger laceration, initial encounter - Plan: doxycycline (VIBRAMYCIN) 100 MG capsule  Trena Platt, PA-C [facility] 1/5/20172:58 PM

## 2015-07-21 ENCOUNTER — Telehealth: Payer: Self-pay

## 2015-07-21 NOTE — Telephone Encounter (Signed)
Patient left voicemail today at [phone] requesting copies of medical records. Did not specify which records she needs. Cb# [id number].

## 2015-07-26 NOTE — Telephone Encounter (Signed)
Returned call to patient. Phone went straight to voicemail. Left message asking to call back and specify which records she needs so we can process her request.

## 2015-08-03 NOTE — Telephone Encounter (Signed)
Still no call back from this patient on what records she needs. Will wait before processing request.

## 2016-02-28 ENCOUNTER — Emergency Department (HOSPITAL_COMMUNITY): Admission: EM | Admit: 2016-02-28 | Discharge: 2016-02-28 | Discharge status: Against Medical Advice | Payer: Self-pay

## 2016-02-28 ENCOUNTER — Ambulatory Visit (HOSPITAL_COMMUNITY)
Admission: EM | Admit: 2016-02-28 | Discharge: 2016-02-28 | Disposition: A | Discharge status: Home / Self Care | Payer: Self-pay | Attending: Family Medicine | Admitting: Family Medicine

## 2016-02-28 ENCOUNTER — Encounter (HOSPITAL_COMMUNITY): Payer: Self-pay | Admitting: Emergency Medicine

## 2016-02-28 DIAGNOSIS — R1030 Lower abdominal pain, unspecified: Secondary | ICD-10-CM

## 2016-02-28 DIAGNOSIS — Z791 Long term (current) use of non-steroidal anti-inflammatories (NSAID): Secondary | ICD-10-CM | POA: Insufficient documentation

## 2016-02-28 DIAGNOSIS — F17210 Nicotine dependence, cigarettes, uncomplicated: Secondary | ICD-10-CM | POA: Insufficient documentation

## 2016-02-28 DIAGNOSIS — R109 Unspecified abdominal pain: Secondary | ICD-10-CM | POA: Insufficient documentation

## 2016-02-28 DIAGNOSIS — Z5321 Procedure and treatment not carried out due to patient leaving prior to being seen by health care provider: Secondary | ICD-10-CM | POA: Insufficient documentation

## 2016-02-28 LAB — POCT URINALYSIS DIP (DEVICE)
Bilirubin Urine: NEGATIVE
GLUCOSE, UA: NEGATIVE mg/dL
KETONES UR: NEGATIVE mg/dL
Nitrite: NEGATIVE
Protein, ur: NEGATIVE mg/dL
SPECIFIC GRAVITY, URINE: 1.01 (ref 1.005–1.030)
UROBILINOGEN UA: 0.2 mg/dL (ref 0.0–1.0)
pH: 6.5 (ref 5.0–8.0)

## 2016-02-28 LAB — POCT PREGNANCY, URINE: Preg Test, Ur: NEGATIVE

## 2016-02-28 MED ORDER — ONDANSETRON HCL 4 MG/2ML IJ SOLN
4.0000 mg | Freq: Once | INTRAMUSCULAR | Status: AC
  Administered 2016-02-28: 4 mg via INTRAMUSCULAR

## 2016-02-28 MED ORDER — ONDANSETRON HCL 4 MG/2ML IJ SOLN
INTRAMUSCULAR | Status: AC
  Filled 2016-02-28: qty 2

## 2016-02-28 NOTE — ED Notes (Signed)
Pt did not want to wait to be seen.  Advised pt to return if symptoms persist or worsen.

## 2016-02-28 NOTE — ED Notes (Signed)
[place] . Pt transferred via shuttle. Pt. Continues to vomit

## 2016-02-28 NOTE — ED Triage Notes (Signed)
Pt presents via EMS with c/o nausea, vomiting, and abdominal pain. EMS reports [facility] and was given a shot of zofran. Pt reports she is unable to eat anything or hold any fluids down so she wanted to come to the hospital. EMS vitals BP 122/88, P 100, CBG 123, R 18.

## 2016-02-28 NOTE — ED Provider Notes (Signed)
[facility]    CSN: [id number] Arrival date & time: 02/28/16  1750  First Provider Contact:  First MD Initiated Contact with Patient 02/28/16 1858        History   Chief Complaint Chief Complaint  Patient presents with  . Back Pain  . Abdominal Pain    HPI Leslie Yates is a 25 y.o. female.    Abdominal Pain  Pain location:  Suprapubic Pain quality: burning and cramping   Pain radiates to:  Epigastric region Pain severity:  Moderate Onset quality:  Sudden Duration:  12 hours Progression:  Worsening Chronicity:  New Relieved by:  None tried Worsened by:  Nothing Ineffective treatments:  None tried Associated symptoms: nausea   Associated symptoms: no chills, no constipation, no diarrhea, no dysuria, no fever, no hematuria, no sore throat, no vaginal bleeding, no vaginal discharge and no vomiting   Risk factors: obesity     Past Medical History:  Diagnosis Date  . Asthma   . Obesity     There are no active problems to display for this patient.   History reviewed. No pertinent surgical history.  OB History    No data available       Home Medications    Prior to Admission medications   Medication Sig Start Date End Date Taking? Authorizing Provider  ibuprofen (ADVIL,MOTRIN) 200 MG tablet Take 200 mg by mouth every 6 (six) hours as needed for headache or mild pain.    Historical Provider, MD    Family History Family History  Problem Relation Age of Onset  . Diabetes Mother   . Hypertension Mother   . Diabetes Father   . Hypertension Father     Social History Social History  Substance Use Topics  . Smoking status: Current Some Day Smoker    Packs/day: 1.00    Years: 11.00    Types: Cigarettes  . Smokeless tobacco: Current User  . Alcohol use 0.0 oz/week     Allergies   Penicillins   Review of Systems Review of Systems  Constitutional: Negative.  Negative for chills and fever.  HENT: Negative.  Negative for sore throat.     Gastrointestinal: Positive for abdominal pain and nausea. Negative for constipation, diarrhea and vomiting.  Genitourinary: Positive for pelvic pain. Negative for dysuria, frequency, hematuria, vaginal bleeding and vaginal discharge.  Musculoskeletal: Negative.   All other systems reviewed and are negative.    Physical Exam Triage Vital Signs ED Triage Vitals  Enc Vitals Group     BP 02/28/16 1839 101/69     Pulse Rate 02/28/16 1839 98     Resp 02/28/16 1839 17     Temp 02/28/16 1839 97.9 F (36.6 C)     Temp Source 02/28/16 1839 Oral     SpO2 02/28/16 1839 97 %     Weight --      Height --      Head Circumference --      Peak Flow --      Pain Score 02/28/16 1838 8     Pain Loc --      Pain Edu? --      Excl. in GC? --    No data found.   Updated Vital Signs BP 101/69   Pulse 98   Temp 97.9 F (36.6 C) (Oral)   Resp 17   LMP 02/25/2016   SpO2 97%   Visual Acuity Right Eye Distance:   Left Eye Distance:   Bilateral  Distance:    Right Eye Near:   Left Eye Near:    Bilateral Near:     Physical Exam  Constitutional: She is oriented to person, place, and time. She appears well-developed and well-nourished. She appears distressed.  Neck: Normal range of motion. Neck supple.  Cardiovascular: Normal rate and regular rhythm.   Pulmonary/Chest: Effort normal and breath sounds normal.  Abdominal: She exhibits no mass. There is tenderness. There is no rebound and no guarding. No hernia.  Lymphadenopathy:    She has no cervical adenopathy.  Neurological: She is alert and oriented to person, place, and time.  Skin: Skin is warm and dry.  Nursing note and vitals reviewed.    UC Treatments / Results  Labs (all labs ordered are listed, but only abnormal results are displayed) Labs Reviewed  URINALYSIS, DIPSTICK ONLY  u/a neg.   EKG  EKG Interpretation None       Radiology No results found.  Procedures Procedures (including critical care  time)  Medications Ordered in UC Medications - No data to display   Initial Impression / Assessment and Plan / UC Course  I have reviewed the triage vital signs and the nursing notes.  Pertinent labs & imaging results that were available during my care of the patient were reviewed by me and considered in my  medical decision making (see chart for details).  Clinical Course  sent for abd pain eval, with n/v, no vag sx, no urinary sx, preg neg.    Final Clinical Impressions(s) / UC Diagnoses   Final diagnoses:  None    New Prescriptions New Prescriptions   No medications on file     Linna HoffJames D Mikayela Deats, MD 02/28/16 [id number]

## 2016-02-28 NOTE — ED Triage Notes (Signed)
Pt. Stated, My back and abdomen are hurting and aching so bad. I thought since I drank a bunch of juice it was making my bladder hurt cause I had to go to bathroom, but that wasn't it at all.

## 2016-02-29 ENCOUNTER — Encounter (HOSPITAL_COMMUNITY): Payer: Self-pay | Admitting: Emergency Medicine

## 2016-02-29 ENCOUNTER — Telehealth: Payer: Self-pay | Admitting: *Deleted

## 2016-02-29 ENCOUNTER — Emergency Department (HOSPITAL_COMMUNITY)
Admission: EM | Admit: 2016-02-29 | Discharge: 2016-02-29 | Disposition: A | Discharge status: Home / Self Care | Payer: Self-pay | Attending: Emergency Medicine | Admitting: Emergency Medicine

## 2016-02-29 ENCOUNTER — Emergency Department (HOSPITAL_COMMUNITY)
Admission: EM | Admit: 2016-02-29 | Discharge: 2016-02-29 | Disposition: A | Discharge status: Home / Self Care | Payer: Self-pay | Attending: Dermatology | Admitting: Dermatology

## 2016-02-29 DIAGNOSIS — R1084 Generalized abdominal pain: Secondary | ICD-10-CM

## 2016-02-29 DIAGNOSIS — R112 Nausea with vomiting, unspecified: Secondary | ICD-10-CM | POA: Insufficient documentation

## 2016-02-29 DIAGNOSIS — F17210 Nicotine dependence, cigarettes, uncomplicated: Secondary | ICD-10-CM | POA: Insufficient documentation

## 2016-02-29 DIAGNOSIS — J45909 Unspecified asthma, uncomplicated: Secondary | ICD-10-CM | POA: Insufficient documentation

## 2016-02-29 LAB — COMPREHENSIVE METABOLIC PANEL
ALBUMIN: 3.6 g/dL (ref 3.5–5.0)
ALK PHOS: 27 U/L — AB (ref 38–126)
ALT: 11 U/L — AB (ref 14–54)
AST: 19 U/L (ref 15–41)
Anion gap: 14 (ref 5–15)
BUN: 10 mg/dL (ref 6–20)
CALCIUM: 8.5 mg/dL — AB (ref 8.9–10.3)
CHLORIDE: 104 mmol/L (ref 101–111)
CO2: 21 mmol/L — AB (ref 22–32)
CREATININE: 0.91 mg/dL (ref 0.44–1.00)
GFR calc Af Amer: >60 mL/min (ref >60)
GFR calc non Af Amer: >60 mL/min (ref >60)
GLUCOSE: 129 mg/dL — AB (ref 65–99)
Potassium: 3.2 mmol/L — ABNORMAL LOW (ref 3.5–5.1)
SODIUM: 139 mmol/L (ref 135–145)
Total Bilirubin: 0.8 mg/dL (ref 0.3–1.2)
Total Protein: 6 g/dL — ABNORMAL LOW (ref 6.5–8.1)

## 2016-02-29 LAB — URINALYSIS, ROUTINE W REFLEX MICROSCOPIC
Bilirubin Urine: NEGATIVE
GLUCOSE, UA: NEGATIVE mg/dL
Hgb urine dipstick: NEGATIVE
Ketones, ur: 15 mg/dL — AB
LEUKOCYTES UA: NEGATIVE
NITRITE: NEGATIVE
PH: 7 (ref 5.0–8.0)
PROTEIN: 30 mg/dL — AB
Specific Gravity, Urine: 1.022 (ref 1.005–1.030)

## 2016-02-29 LAB — CBC WITH DIFFERENTIAL/PLATELET
BASOS ABS: 0 10*3/uL (ref 0.0–0.1)
BASOS PCT: 0 %
EOS ABS: 0 10*3/uL (ref 0.0–0.7)
Eosinophils Relative: 0 %
HCT: 38.8 % (ref 36.0–46.0)
HEMOGLOBIN: 13.7 g/dL (ref 12.0–15.0)
LYMPHS ABS: 0.7 10*3/uL (ref 0.7–4.0)
Lymphocytes Relative: 5 %
MCH: 30.4 pg (ref 26.0–34.0)
MCHC: 35.3 g/dL (ref 30.0–36.0)
MCV: 86 fL (ref 78.0–100.0)
Monocytes Absolute: 0.2 10*3/uL (ref 0.1–1.0)
Monocytes Relative: 2 %
NEUTROS PCT: 93 %
Neutro Abs: 11.8 10*3/uL — ABNORMAL HIGH (ref 1.7–7.7)
Platelets: 230 10*3/uL (ref 150–400)
RBC: 4.51 MIL/uL (ref 3.87–5.11)
RDW: 11.6 % (ref 11.5–15.5)
WBC: 12.7 10*3/uL — AB (ref 4.0–10.5)

## 2016-02-29 LAB — URINE MICROSCOPIC-ADD ON

## 2016-02-29 LAB — I-STAT BETA HCG BLOOD, ED (MC, WL, AP ONLY): I-stat hCG, quantitative: <5 m[IU]/mL (ref <5)

## 2016-02-29 LAB — LIPASE, BLOOD: Lipase: 17 U/L (ref 11–51)

## 2016-02-29 MED ORDER — DICYCLOMINE HCL 20 MG PO TABS: 20.0000 mg | ORAL_TABLET | Freq: Two times a day (BID) | ORAL | 0 refills | Status: DC

## 2016-02-29 MED ORDER — ONDANSETRON HCL 4 MG/2ML IJ SOLN
4.0000 mg | Freq: Once | INTRAMUSCULAR | Status: AC
  Administered 2016-02-29: 4 mg via INTRAVENOUS
  Filled 2016-02-29: qty 2

## 2016-02-29 MED ORDER — DICYCLOMINE HCL 10 MG/ML IM SOLN
20.0000 mg | Freq: Once | INTRAMUSCULAR | Status: AC
  Administered 2016-02-29: 20 mg via INTRAMUSCULAR
  Filled 2016-02-29: qty 2

## 2016-02-29 MED ORDER — SODIUM CHLORIDE 0.9 % IV BOLUS (SEPSIS)
1000.0000 mL | Freq: Once | INTRAVENOUS | Status: AC
  Administered 2016-02-29: 1000 mL via INTRAVENOUS

## 2016-02-29 MED ORDER — ONDANSETRON 4 MG PO TBDP: 4.0000 mg | ORAL_TABLET | Freq: Three times a day (TID) | ORAL | 0 refills | Status: DC | PRN

## 2016-02-29 MED ORDER — KETOROLAC TROMETHAMINE 30 MG/ML IJ SOLN
30.0000 mg | Freq: Once | INTRAMUSCULAR | Status: AC
Start: 2016-02-29 — End: 2016-02-29
  Administered 2016-02-29: 30 mg via INTRAVENOUS
  Filled 2016-02-29: qty 1

## 2016-02-29 NOTE — ED Provider Notes (Signed)
TIME SEEN: 5:50 AM  CHIEF COMPLAINT: Abdominal pain, vomiting  HPI: Pt is a 25 y.o. female with history of obesity, asthma who presents to the emergency department with diffuse abdominal pain that she describes as a "severe pain" that started 2 days ago with nausea and vomiting. No diarrhea. No fevers or chills. No dysuria, hematuria, vaginal bleeding or discharge. No history of abdominal surgery. No sick contacts or recent travel. Was seen in urgent care yesterday and had a negative pregnancy test and was sent to the emergency department for further evaluation. States she went to bed Leslie Yates and [facility] emergency department but left because the wait was too long.  ROS: See HPI Constitutional: no fever  Eyes: no drainage  ENT: no runny nose   Cardiovascular:  no chest pain  Resp: no SOB  GI: no vomiting GU: no dysuria Integumentary: no rash  Allergy: no hives  Musculoskeletal: no leg swelling  Neurological: no slurred speech ROS otherwise negative  PAST MEDICAL HISTORY/PAST SURGICAL HISTORY:  Past Medical History:  Diagnosis Date  . Asthma   . Obesity     MEDICATIONS:  Prior to Admission medications   Medication Sig Start Date End Date Taking? Authorizing Provider  ibuprofen (ADVIL,MOTRIN) 200 MG tablet Take 200 mg by mouth every 6 (six) hours as needed for headache or mild pain.    Historical Provider, MD    ALLERGIES:  Allergies  Allergen Reactions  . Penicillins Rash    Childhood rash Has patient had a PCN reaction causing immediate rash, facial/tongue/throat swelling, SOB or lightheadedness with hypotension: no Has patient had a PCN reaction causing severe rash involving mucus membranes or skin necrosis:no Has patient had a PCN reaction that required hospitalization no Has patient had a PCN reaction occurring within the last 10 years: no If all of the above answers are "NO", then may proceed with Cephalosporin use.     SOCIAL HISTORY:  Social History   Substance Use Topics  . Smoking status: Current Some Day Smoker    Packs/day: 1.00    Years: 11.00    Types: Cigarettes  . Smokeless tobacco: Current User  . Alcohol use 0.0 oz/week    FAMILY HISTORY: Family History  Problem Relation Age of Onset  . Diabetes Mother   . Hypertension Mother   . Diabetes Father   . Hypertension Father     EXAM: BP 146/80 (BP Location: Left Arm)   Pulse 92   Temp 98.1 F (36.7 C) (Oral)   Resp 16   Ht 6\' 1"  (1.854 m)   Wt 267 lb (121.1 kg)   LMP 02/25/2016   SpO2 100%   BMI 35.23 kg/m  CONSTITUTIONAL: Alert and oriented and responds appropriately to questions. Well-appearing; well-nourished, Obese, patient is resting comfortably when I walk into the room, afebrile and nontoxic HEAD: Normocephalic EYES: Conjunctivae clear, PERRL ENT: normal nose; no rhinorrhea; moist mucous membranes NECK: Supple, no meningismus, no LAD  CARD: Regular and bradycardic; S1 and S2 appreciated; no murmurs, no clicks, no rubs, no gallops RESP: Normal chest excursion without splinting or tachypnea; breath sounds clear and equal bilaterally; no wheezes, no rhonchi, no rales, no hypoxia or respiratory distress, speaking full sentences ABD/GI: Normal bowel sounds; non-distended; soft, minimal tenderness palpation diffusely but when distracted her abdominal exam is completely benign, no rebound, no guarding, no peritoneal signs, no tenderness at McBurney's point, negative Murphy sign BACK:  The back appears normal and is non-tender to palpation, there is no CVA  tenderness EXT: Normal ROM in all joints; non-tender to palpation; no edema; normal capillary refill; no cyanosis, no calf tenderness or swelling    SKIN: Normal color for age and race; warm; no rash NEURO: Moves all extremities equally, sensation to light touch intact diffusely, cranial nerves II through XII intact PSYCH: The patient's mood and manner are appropriate. Grooming and personal hygiene are  appropriate.  MEDICAL DECISION MAKING: Patient here with abdominal pain, vomiting. It appears she is here frequently for vomiting and diarrhea. Her abdominal exam is benign. She is afebrile and hemodynamically stable. Doubt appendicitis, colitis, diverticulitis, bowel obstruction. We'll obtain labs, urine. Will treat her symptoms with IV fluids, Toradol, Bentyl, Zofran and reassess.  Note, patient is bradycardic. She reports is chronic for her. EKG shows sinus bradycardia without interval abnormality. No chest pain or shortness of breath.  ED PROGRESS: 7:30 AM  Pt reports feeling better. Eating and drink without difficulty. Able to ambulate to the restroom. Urine pending.  8:00 AM  Pt's abdominal exam is benign. She's been able to drink without vomiting and reports feeling better. Urine shows no sign of infection. Discussed with her that I suspect this could be a viral illness, gastritis. I feel she is safe to be discharged home. She is comfortable with this plan.   At this time, I do not feel there is any life-threatening condition present. I have reviewed and discussed all results (EKG, imaging, lab, urine as appropriate), exam findings with patient/family. I have reviewed nursing notes and appropriate previous records.  I feel the patient is safe to be discharged home without further emergent workup and can continue workup as an outpatient as needed. Discussed usual and customary return precautions. Patient/family verbalize understanding and are comfortable with this plan.  Outpatient follow-up has been provided. All questions have been answered.      EKG Interpretation  Date/Time:  Wednesday February 29 2016 07:40:27 EDT Ventricular Rate:  44 PR Interval:    QRS Duration: 97 QT Interval:  499 QTC Calculation: 427 R Axis:   65 Text Interpretation:  Sinus bradycardia Low voltage, precordial leads Baseline wander in lead(s) V6 No significant change since last tracing Confirmed by Leslie Mcneary,   DO, Leslie Yates [phone]([id number]) on 02/29/2016 7:43:16 AM         Leslie MawKristen N Jomayra Novitsky, DO 02/29/16 [id number]

## 2016-02-29 NOTE — Discharge Instructions (Signed)
To find a primary care or specialty doctor please call [phone] or [phone] to access "[facility] Find a Doctor Service." ° °You may also go on the [facility] website at [email or web address] ° °There are also multiple [facility] practices throughout the [place] that are frequently accepting new patients. You may find a clinic that is close to your home and contact them. ° °[facility] -  °[address] °[place] [address] °[phone] ° °[facility] in [place] (also locations in [place] and [place]) -  °[address] °[place] [address] °[phone] ° °[facility] -  °[address] °[place] [address] °[phone] ° ° °

## 2016-02-29 NOTE — ED Triage Notes (Addendum)
Pt. reports emesis with mid abdominal pain onset yesterday , denies diarrhea , no fever or chills. Pt. was seen at urgent care yesterday .

## 2016-02-29 NOTE — ED Triage Notes (Signed)
Pt reported she was leaving and called a ride and was waiting for her ride, no longer wishing to be seen in the ER.

## 2016-02-29 NOTE — Telephone Encounter (Signed)
Pt calling stating Rx prescribed is too expensive and she can not afford them.  NCM searched GoodRx.com for an affordable coupon.  NCM text coupon to pt phone and stayed online until it was received.  Pt very appreciative.   Whitten Andreoni J. Makaela Cando, RN, BSN, NCM [phone]   

## 2016-02-29 NOTE — ED Notes (Signed)
Dr. Ward at bedside.

## 2016-03-01 ENCOUNTER — Emergency Department (HOSPITAL_COMMUNITY): Payer: Self-pay

## 2016-03-01 ENCOUNTER — Encounter (HOSPITAL_COMMUNITY): Payer: Self-pay

## 2016-03-01 ENCOUNTER — Emergency Department (HOSPITAL_COMMUNITY)
Admission: EM | Admit: 2016-03-01 | Discharge: 2016-03-01 | Disposition: A | Discharge status: Home / Self Care | Payer: Self-pay | Attending: Emergency Medicine | Admitting: Emergency Medicine

## 2016-03-01 DIAGNOSIS — Z79899 Other long term (current) drug therapy: Secondary | ICD-10-CM | POA: Insufficient documentation

## 2016-03-01 DIAGNOSIS — J45909 Unspecified asthma, uncomplicated: Secondary | ICD-10-CM | POA: Insufficient documentation

## 2016-03-01 DIAGNOSIS — F17210 Nicotine dependence, cigarettes, uncomplicated: Secondary | ICD-10-CM | POA: Insufficient documentation

## 2016-03-01 DIAGNOSIS — R102 Pelvic and perineal pain: Secondary | ICD-10-CM

## 2016-03-01 DIAGNOSIS — R1031 Right lower quadrant pain: Secondary | ICD-10-CM | POA: Insufficient documentation

## 2016-03-01 DIAGNOSIS — N83201 Unspecified ovarian cyst, right side: Secondary | ICD-10-CM

## 2016-03-01 LAB — COMPREHENSIVE METABOLIC PANEL
ALT: 10 U/L — AB (ref 14–54)
AST: 18 U/L (ref 15–41)
Albumin: 3.6 g/dL (ref 3.5–5.0)
Alkaline Phosphatase: 27 U/L — ABNORMAL LOW (ref 38–126)
Anion gap: 9 (ref 5–15)
BILIRUBIN TOTAL: 1.2 mg/dL (ref 0.3–1.2)
BUN: 7 mg/dL (ref 6–20)
CALCIUM: 8.7 mg/dL — AB (ref 8.9–10.3)
CO2: 21 mmol/L — ABNORMAL LOW (ref 22–32)
CREATININE: 0.97 mg/dL (ref 0.44–1.00)
Chloride: 111 mmol/L (ref 101–111)
Glucose, Bld: 107 mg/dL — ABNORMAL HIGH (ref 65–99)
Potassium: 3.4 mmol/L — ABNORMAL LOW (ref 3.5–5.1)
Sodium: 141 mmol/L (ref 135–145)
TOTAL PROTEIN: 5.8 g/dL — AB (ref 6.5–8.1)

## 2016-03-01 LAB — CBC
HCT: 38.3 % (ref 36.0–46.0)
Hemoglobin: 13.2 g/dL (ref 12.0–15.0)
MCH: 30.1 pg (ref 26.0–34.0)
MCHC: 34.5 g/dL (ref 30.0–36.0)
MCV: 87.4 fL (ref 78.0–100.0)
PLATELETS: 239 10*3/uL (ref 150–400)
RBC: 4.38 MIL/uL (ref 3.87–5.11)
RDW: 11.8 % (ref 11.5–15.5)
WBC: 10.9 10*3/uL — AB (ref 4.0–10.5)

## 2016-03-01 LAB — GC/CHLAMYDIA PROBE AMP (~~LOC~~) NOT AT ARMC
CHLAMYDIA, DNA PROBE: NEGATIVE
NEISSERIA GONORRHEA: NEGATIVE

## 2016-03-01 LAB — WET PREP, GENITAL
SPERM: NONE SEEN
Trich, Wet Prep: NONE SEEN
YEAST WET PREP: NONE SEEN

## 2016-03-01 LAB — LIPASE, BLOOD: Lipase: 22 U/L (ref 11–51)

## 2016-03-01 MED ORDER — METOCLOPRAMIDE HCL 5 MG/ML IJ SOLN
10.0000 mg | Freq: Once | INTRAMUSCULAR | Status: AC
  Administered 2016-03-01: 10 mg via INTRAVENOUS
  Filled 2016-03-01: qty 2

## 2016-03-01 MED ORDER — IOPAMIDOL (ISOVUE-300) INJECTION 61%
INTRAVENOUS | Status: AC
  Administered 2016-03-01: 100 mL
  Filled 2016-03-01: qty 100

## 2016-03-01 MED ORDER — HYDROCODONE-ACETAMINOPHEN 5-325 MG PO TABS: 1.0000 | ORAL_TABLET | ORAL | 0 refills | Status: DC | PRN

## 2016-03-01 MED ORDER — PROMETHAZINE HCL 25 MG PO TABS: 25.0000 mg | ORAL_TABLET | Freq: Four times a day (QID) | ORAL | 0 refills | Status: DC | PRN

## 2016-03-01 MED ORDER — HYDROMORPHONE HCL 1 MG/ML IJ SOLN
1.0000 mg | Freq: Once | INTRAMUSCULAR | Status: AC
  Administered 2016-03-01: 1 mg via INTRAVENOUS
  Filled 2016-03-01: qty 1

## 2016-03-01 MED ORDER — SODIUM CHLORIDE 0.9 % IV BOLUS (SEPSIS)
1000.0000 mL | Freq: Once | INTRAVENOUS | Status: AC
  Administered 2016-03-01: 1000 mL via INTRAVENOUS

## 2016-03-01 NOTE — ED Notes (Signed)
Pts girlfriend: Leslie Yates: [phone]

## 2016-03-01 NOTE — ED Notes (Signed)
Patient transported to CT 

## 2016-03-01 NOTE — ED Notes (Signed)
Patient transported to Ultrasound 

## 2016-03-01 NOTE — ED Triage Notes (Signed)
Per EMS pt was seen x 2 on 02/29/16 for same cc n/v; Pt states RLQ abdominal pain started on yesterday; Pt was sent home with Zofran but has not been able to keep Zofran down. Pt states pain 8/10 on arrival. Pt a&ox 4.

## 2016-03-01 NOTE — ED Notes (Signed)
MD at bedside. 

## 2016-03-01 NOTE — ED Provider Notes (Addendum)
[facility]-EMERGENCY DEPT Provider Note   CSN: [id number] Arrival date & time: 03/01/16  0436     History   Chief Complaint Chief Complaint  Patient presents with  . Emesis    HPI Leslie Yates is a 25 y.o. female.  Patient presents to the emergency department for evaluation of abdominal pain with nausea and vomiting. Patient reports the symptoms have been ongoing for 3 days. She was seen at urgent care and in the emergency department yesterday. Patient reports that since she has gone home she has had persistent nausea, vomiting, cannot hold anything down. She continues to have sharp, stabbing and moderate to severe pain in the right side of her abdomen. She feels constipated, has not had a bowel movement in several days.      Past Medical History:  Diagnosis Date  . Asthma   . Obesity     There are no active problems to display for this patient.   History reviewed. No pertinent surgical history.  OB History    No data available       Home Medications    Prior to Admission medications   Medication Sig Start Date End Date Taking? Authorizing Provider  dicyclomine (BENTYL) 20 MG tablet Take 1 tablet (20 mg total) by mouth 2 (two) times daily. 02/29/16  Yes Kristen N Ward, DO  ibuprofen (ADVIL,MOTRIN) 200 MG tablet Take 200 mg by mouth every 6 (six) hours as needed for headache or mild pain.   Yes Historical Provider, MD  ondansetron (ZOFRAN ODT) 4 MG disintegrating tablet Take 1 tablet (4 mg total) by mouth every 8 (eight) hours as needed for nausea or vomiting. 02/29/16  Yes Kristen N Ward, DO  HYDROcodone-acetaminophen (NORCO/VICODIN) 5-325 MG tablet Take 1-2 tablets by mouth every 4 (four) hours as needed for moderate pain. 03/01/16   [name], MD  promethazine (PHENERGAN) 25 MG tablet Take 1 tablet (25 mg total) by mouth every 6 (six) hours as needed for nausea or vomiting. 03/01/16   [name], MD    Family History Family History  Problem Relation  Age of Onset  . Diabetes Mother   . Hypertension Mother   . Diabetes Father   . Hypertension Father     Social History Social History  Substance Use Topics  . Smoking status: Current Some Day Smoker    Packs/day: 1.00    Years: 11.00    Types: Cigarettes  . Smokeless tobacco: Current User  . Alcohol use 0.0 oz/week     Allergies   Penicillins   Review of Systems Review of Systems  Gastrointestinal: Positive for abdominal pain, constipation, nausea and vomiting.  All other systems reviewed and are negative.    Physical Exam Updated Vital Signs BP 117/58 (BP Location: Right Arm)   Pulse (!) 48   Temp 98.1 F (36.7 C) (Oral)   Resp 15   LMP 02/26/2016   SpO2 100%   Physical Exam  Constitutional: She is oriented to person, place, and time. She appears well-developed and well-nourished. No distress.  HENT:  Head: Normocephalic and atraumatic.  Right Ear: Hearing normal.  Left Ear: Hearing normal.  Nose: Nose normal.  Mouth/Throat: Oropharynx is clear and moist and mucous membranes are normal.  Eyes: Conjunctivae and EOM are normal. Pupils are equal, round, and reactive to light.  Neck: Normal range of motion. Neck supple.  Cardiovascular: Regular rhythm, S1 normal and S2 normal.  Exam reveals no gallop and no friction rub.  No murmur heard. Pulmonary/Chest: Effort normal and breath sounds normal. No respiratory distress. She exhibits no tenderness.  Abdominal: Soft. Normal appearance and bowel sounds are normal. There is no hepatosplenomegaly. There is tenderness in the right lower quadrant and suprapubic area. There is no rebound, no guarding, no tenderness at McBurney's point and negative Murphy's sign. No hernia.  Genitourinary: Vagina normal. Cervix exhibits no motion tenderness, no discharge and no friability. Right adnexum displays mass and tenderness. Left adnexum displays no tenderness.  Musculoskeletal: Normal range of motion.  Neurological: She is alert  and oriented to person, place, and time. She has normal strength. No cranial nerve deficit or sensory deficit. Coordination normal. GCS eye subscore is 4. GCS verbal subscore is 5. GCS motor subscore is 6.  Skin: Skin is warm, dry and intact. No rash noted. No cyanosis.  Psychiatric: She has a normal mood and affect. Her speech is normal and behavior is normal. Thought content normal.  Nursing note and vitals reviewed.    ED Treatments / Results  Labs (all labs ordered are listed, but only abnormal results are displayed) Labs Reviewed  COMPREHENSIVE METABOLIC PANEL - Abnormal; Notable for the following:       Result Value   Potassium 3.4 (*)    CO2 21 (*)    Glucose, Bld 107 (*)    Calcium 8.7 (*)    Total Protein 5.8 (*)    ALT 10 (*)    Alkaline Phosphatase 27 (*)    All other components within normal limits  CBC - Abnormal; Notable for the following:    WBC 10.9 (*)    All other components within normal limits  WET PREP, GENITAL  LIPASE, BLOOD  GC/CHLAMYDIA PROBE AMP ([place]) NOT AT [facility]    EKG  EKG Interpretation None       Radiology US Transvaginal Non-ob  Result Date: 03/01/2016 CLINICAL DATA:  Right lower quadrant pelvic pain for the past 3 days. EXAM: TRANSABDOMINAL AND TRANSVAGINAL ULTRASOUND OF PELVIS DOPPLER ULTRASOUND OF OVARIES TECHNIQUE: Both transabdominal and transvaginal ultrasound examinations of the pelvis were performed. Transabdominal technique was performed for global imaging of the pelvis including uterus, ovaries, adnexal regions, and pelvic cul-de-sac. It was necessary to proceed with endovaginal exam following the transabdominal exam to visualize the uterus, endometrium, ovaries, and adnexal regions. Color and duplex Doppler ultrasound was utilized to evaluate blood flow to the ovaries. COMPARISON:  CT scan of September 7th 2017. FINDINGS: Uterus Measurements: 6.3 x 3.5 x 3.8 cm. No fibroids or other mass visualized. Endometrium Thickness: 7.5 mm.   No focal abnormality visualized. Right ovary Measurements: Marked enlargement of the left ovary measuring 12.1 x 7.8 x 12.8 cm. It complains a complex cystic structure measuring 10.6 x 7.9 x 10.2 cm and exhibits internal debris. There is a thin rim of solid ovarian tissue surrounding it. Vascularity is not abnormal. Left ovary Measurements: 3.6 x 1.8 x 2.9 cm. There is a simple appearing cyst measuring 1.7 x 1.4 x 1.4 cm. Pulsed Doppler evaluation of both ovaries demonstrates normal low-resistance arterial and venous waveforms within the visualized portions of the solid ovarian tissue on the right. The vascularity on the left appears normal. Other findings There is a small to moderate amount of free pelvic fluid. IMPRESSION: 1. Complex cystic right adnexal mass measuring up to 12.8 cm. The solid ovarian tissue visualized exhibits normal vascularity with no findings suspicious for torsion. The different here includes a hemorrhagic ovarian cyst, tubo-ovarian abscess, or ectopic pregnancy.  Reportedly the patient's beta HCG yesterday was less than 5 units. Given the size of this cystic mass, gynecological evaluation is recommended. 2. Normal-appearing left ovary. Normal appearance of the uterus and endometrium. 3. Small or moderate amount of free pelvic fluid. Electronically Signed   By: David  SwazilandJordan M.D.   On: 03/01/2016 07:28   [place] Pelvis Complete  Result Date: 03/01/2016 CLINICAL DATA:  Right lower quadrant pelvic pain for the past 3 days. EXAM: TRANSABDOMINAL AND TRANSVAGINAL ULTRASOUND OF PELVIS DOPPLER ULTRASOUND OF OVARIES TECHNIQUE: Both transabdominal and transvaginal ultrasound examinations of the pelvis were performed. Transabdominal technique was performed for global imaging of the pelvis including uterus, ovaries, adnexal regions, and pelvic cul-de-sac. It was necessary to proceed with endovaginal exam following the transabdominal exam to visualize the uterus, endometrium, ovaries, and adnexal regions.  Color and duplex Doppler ultrasound was utilized to evaluate blood flow to the ovaries. COMPARISON:  CT scan of September 7th 2017. FINDINGS: Uterus Measurements: 6.3 x 3.5 x 3.8 cm. No fibroids or other mass visualized. Endometrium Thickness: 7.5 mm.  No focal abnormality visualized. Right ovary Measurements: Marked enlargement of the left ovary measuring 12.1 x 7.8 x 12.8 cm. It complains a complex cystic structure measuring 10.6 x 7.9 x 10.2 cm and exhibits internal debris. There is a thin rim of solid ovarian tissue surrounding it. Vascularity is not abnormal. Left ovary Measurements: 3.6 x 1.8 x 2.9 cm. There is a simple appearing cyst measuring 1.7 x 1.4 x 1.4 cm. Pulsed Doppler evaluation of both ovaries demonstrates normal low-resistance arterial and venous waveforms within the visualized portions of the solid ovarian tissue on the right. The vascularity on the left appears normal. Other findings There is a small to moderate amount of free pelvic fluid. IMPRESSION: 1. Complex cystic right adnexal mass measuring up to 12.8 cm. The solid ovarian tissue visualized exhibits normal vascularity with no findings suspicious for torsion. The different here includes a hemorrhagic ovarian cyst, tubo-ovarian abscess, or ectopic pregnancy. Reportedly the patient's beta HCG yesterday was less than 5 units. Given the size of this cystic mass, gynecological evaluation is recommended. 2. Normal-appearing left ovary. Normal appearance of the uterus and endometrium. 3. Small or moderate amount of free pelvic fluid. Electronically Signed   By: David  [place]Jordan M.D.   On: 03/01/2016 07:28   Ct Abdomen Pelvis W Contrast  Result Date: 03/01/2016 CLINICAL DATA:  Abdominal pain.  Fever, nausea and vomiting EXAM: CT ABDOMEN AND PELVIS WITH CONTRAST TECHNIQUE: Multidetector CT imaging of the abdomen and pelvis was performed using the standard protocol following bolus administration of intravenous contrast. CONTRAST:  100 mL ISOVUE-300  IOPAMIDOL (ISOVUE-300) INJECTION 61% COMPARISON:  Abdominal radiograph 07/02/2014, CT abdomen pelvis 5/14/ new 2011 FINDINGS: Lower chest: No pulmonary nodules. No visible pleural or pericardial effusion. Hepatobiliary: Normal hepatic size and contours without focal liver lesion. No perihepatic ascites. No intra- or extrahepatic biliary dilatation. Normal gallbladder. Pancreas: Normal pancreatic contours and enhancement. No peripancreatic fluid collection or pancreatic ductal dilatation. Spleen: Normal. Adrenals/Urinary Tract: Normal adrenal glands. No hydronephrosis or solid renal mass. Stomach/Bowel: No abnormal bowel dilatation. No bowel wall thickening or adjacent fat stranding to indicate acute inflammation. No abdominal fluid collection. Normal appendix. Vascular/Lymphatic: Normal course and caliber of the major abdominal vessels. No abdominal or pelvic adenopathy. Reproductive: There is a large low-attenuation structure arising from the right adnexa, measuring 14 x 11 cm, possibly an edematous ovary. The uterus and left ovary are normal. Small amount of fluid within the posterior  pelvis. Musculoskeletal: No lytic or blastic osseous lesion. Normal visualized extrathoracic and extraperitoneal soft tissues. Other: No contributory non-categorized findings. IMPRESSION: 1. Massively enlarged and edematous right ovary. The appearance is concerning for ovarian torsion. Pelvic ultrasound may be confirmatory. 2. Normal appendix. No other evidence of acute intra-abdominal abnormality. Critical Value/emergent results were called by telephone at the time of interpretation on 03/01/2016 at 5:53 am to Dr. Jaci Carrel , who verbally acknowledged these results. Electronically Signed   By: Deatra Robinson M.D.   On: 03/01/2016 05:55   [place] Art/ven Flow Abd Pelv Doppler  Result Date: 03/01/2016 CLINICAL DATA:  Right lower quadrant pelvic pain for the past 3 days. EXAM: TRANSABDOMINAL AND TRANSVAGINAL ULTRASOUND OF PELVIS  DOPPLER ULTRASOUND OF OVARIES TECHNIQUE: Both transabdominal and transvaginal ultrasound examinations of the pelvis were performed. Transabdominal technique was performed for global imaging of the pelvis including uterus, ovaries, adnexal regions, and pelvic cul-de-sac. It was necessary to proceed with endovaginal exam following the transabdominal exam to visualize the uterus, endometrium, ovaries, and adnexal regions. Color and duplex Doppler ultrasound was utilized to evaluate blood flow to the ovaries. COMPARISON:  CT scan of September 7th 2017. FINDINGS: Uterus Measurements: 6.3 x 3.5 x 3.8 cm. No fibroids or other mass visualized. Endometrium Thickness: 7.5 mm.  No focal abnormality visualized. Right ovary Measurements: Marked enlargement of the left ovary measuring 12.1 x 7.8 x 12.8 cm. It complains a complex cystic structure measuring 10.6 x 7.9 x 10.2 cm and exhibits internal debris. There is a thin rim of solid ovarian tissue surrounding it. Vascularity is not abnormal. Left ovary Measurements: 3.6 x 1.8 x 2.9 cm. There is a simple appearing cyst measuring 1.7 x 1.4 x 1.4 cm. Pulsed Doppler evaluation of both ovaries demonstrates normal low-resistance arterial and venous waveforms within the visualized portions of the solid ovarian tissue on the right. The vascularity on the left appears normal. Other findings There is a small to moderate amount of free pelvic fluid. IMPRESSION: 1. Complex cystic right adnexal mass measuring up to 12.8 cm. The solid ovarian tissue visualized exhibits normal vascularity with no findings suspicious for torsion. The different here includes a hemorrhagic ovarian cyst, tubo-ovarian abscess, or ectopic pregnancy. Reportedly the patient's beta HCG yesterday was less than 5 units. Given the size of this cystic mass, gynecological evaluation is recommended. 2. Normal-appearing left ovary. Normal appearance of the uterus and endometrium. 3. Small or moderate amount of free pelvic  fluid. Electronically Signed   By: David  [place] M.D.   On: 03/01/2016 07:28    Procedures Procedures (including critical care time)  Medications Ordered in ED Medications  sodium chloride 0.9 % bolus 1,000 mL (0 mLs Intravenous Stopped 03/01/16 0750)  HYDROmorphone (DILAUDID) injection 1 mg (1 mg Intravenous Given 03/01/16 0510)  metoCLOPramide (REGLAN) injection 10 mg (10 mg Intravenous Given 03/01/16 0510)  iopamidol (ISOVUE-300) 61 % injection (100 mLs  Contrast Given 03/01/16 0524)     Initial Impression / Assessment and Plan / ED Course  I have reviewed the triage vital signs and the nursing notes.  Pertinent labs & imaging results that were available during my care of the patient were reviewed by me and considered in my medical decision making (see chart for details).  Clinical Course    Patient presents with persistent right sided pelvic pain with nausea and vomiting. Symptoms have been present for several days. She was seen in the ER for this once already. Basic labs were unremarkable at arrival. She did  have a negative pregnancy test 24 hours ago, this was not repeated. CT scan was performed to evaluate for possibility of appendicitis. She did not have evidence of appendicitis, but did have a large cystic structure in the right adnexa. There was concern for possibility of torsion. Patient underwent ultrasound with duplex to rule this out. She has normal blood flow to both ovaries but does have a complex cystic structure is 12 cm in the right ovary.  Pelvic exam did reveal mass and fullness in the area of the right adnexa, but no cervical motion tenderness or cervical discharge. Doubt tubo-ovarian abscess.  History reveals the patient is not having any pain currently. Her nausea and vomiting has resolved. Case was discussed with Dr. Despina Hidden, on call for OB/GYN. He will see the patient in clinic on Monday, will treat symptomatically until then.  Final Clinical Impressions(s) / ED Diagnoses     Final diagnoses:  Pelvic pain in female  Cyst of right ovary    New Prescriptions New Prescriptions   HYDROCODONE-ACETAMINOPHEN (NORCO/VICODIN) 5-325 MG TABLET    Take 1-2 tablets by mouth every 4 (four) hours as needed for moderate pain.   PROMETHAZINE (PHENERGAN) 25 MG TABLET    Take 1 tablet (25 mg total) by mouth every 6 (six) hours as needed for nausea or vomiting.     [name], MD 03/01/16 1610    [name], MD 03/01/16 [id number]

## 2016-03-08 ENCOUNTER — Encounter: Payer: Self-pay | Admitting: Family Medicine

## 2016-03-08 ENCOUNTER — Encounter: Payer: Self-pay | Admitting: Obstetrics and Gynecology

## 2016-03-08 ENCOUNTER — Ambulatory Visit (INDEPENDENT_AMBULATORY_CARE_PROVIDER_SITE_OTHER): Payer: Self-pay | Admitting: Obstetrics and Gynecology

## 2016-03-08 VITALS — BP 107/64 | HR 61 | Ht 72.0 in | Wt 269.6 lb

## 2016-03-08 DIAGNOSIS — N83201 Unspecified ovarian cyst, right side: Secondary | ICD-10-CM

## 2016-03-08 NOTE — Patient Instructions (Signed)
Ovarian Cyst An ovarian cyst is a fluid-filled sac that forms on an ovary. The ovaries are small organs that produce eggs in women. Various types of cysts can form on the ovaries. Most are not cancerous. Many do not cause problems, and they often go away on their own. Some may cause symptoms and require treatment. Common types of ovarian cysts include:  Functional cysts--These cysts may occur every month during the menstrual cycle. This is normal. The cysts usually go away with the next menstrual cycle if the woman does not get pregnant. Usually, there are no symptoms with a functional cyst.  Endometrioma cysts--These cysts form from the tissue that lines the uterus. They are also called "chocolate cysts" because they become filled with blood that turns brown. This type of cyst can cause pain in the lower abdomen during intercourse and with your menstrual period.  Cystadenoma cysts--This type develops from the cells on the outside of the ovary. These cysts can get very big and cause lower abdomen pain and pain with intercourse. This type of cyst can twist on itself, cut off its blood supply, and cause severe pain. It can also easily rupture and cause a lot of pain.  Dermoid cysts--This type of cyst is sometimes found in both ovaries. These cysts may contain different kinds of body tissue, such as skin, teeth, hair, or cartilage. They usually do not cause symptoms unless they get very big.  Theca lutein cysts--These cysts occur when too much of a certain hormone (human chorionic gonadotropin) is produced and overstimulates the ovaries to produce an egg. This is most common after procedures used to assist with the conception of a baby (in vitro fertilization). CAUSES   Fertility drugs can cause a condition in which multiple large cysts are formed on the ovaries. This is called ovarian hyperstimulation syndrome.  A condition called polycystic ovary syndrome can cause hormonal imbalances that can lead to  nonfunctional ovarian cysts. SIGNS AND SYMPTOMS  Many ovarian cysts do not cause symptoms. If symptoms are present, they may include:  Pelvic pain or pressure.  Pain in the lower abdomen.  Pain during sexual intercourse.  Increasing girth (swelling) of the abdomen.  Abnormal menstrual periods.  Increasing pain with menstrual periods.  Stopping having menstrual periods without being pregnant. DIAGNOSIS  These cysts are commonly found during a routine or annual pelvic exam. Tests may be ordered to find out more about the cyst. These tests may include:  Ultrasound.  X-ray of the pelvis.  CT scan.  MRI.  Blood tests. TREATMENT  Many ovarian cysts go away on their own without treatment. Your health care provider may want to check your cyst regularly for 2-3 months to see if it changes. For women in menopause, it is particularly important to monitor a cyst closely because of the higher rate of ovarian cancer in menopausal women. When treatment is needed, it may include any of the following:  A procedure to drain the cyst (aspiration). This may be done using a long needle and ultrasound. It can also be done through a laparoscopic procedure. This involves using a thin, lighted tube with a tiny camera on the end (laparoscope) inserted through a small incision.  Surgery to remove the whole cyst. This may be done using laparoscopic surgery or an open surgery involving a larger incision in the lower abdomen.  Hormone treatment or birth control pills. These methods are sometimes used to help dissolve a cyst. HOME CARE INSTRUCTIONS   Only take over-the-counter   or prescription medicines as directed by your health care provider.  Follow up with your health care provider as directed.  Get regular pelvic exams and Pap tests. SEEK MEDICAL CARE IF:   Your periods are late, irregular, or painful, or they stop.  Your pelvic pain or abdominal pain does not go away.  Your abdomen becomes  larger or swollen.  You have pressure on your bladder or trouble emptying your bladder completely.  You have pain during sexual intercourse.  You have feelings of fullness, pressure, or discomfort in your stomach.  You lose weight for no apparent reason.  You feel generally ill.  You become constipated.  You lose your appetite.  You develop acne.  You have an increase in body and facial hair.  You are gaining weight, without changing your exercise and eating habits.  You think you are pregnant. SEEK IMMEDIATE MEDICAL CARE IF:   You have increasing abdominal pain.  You feel sick to your stomach (nauseous), and you throw up (vomit).  You develop a fever that comes on suddenly.  You have abdominal pain during a bowel movement.  Your menstrual periods become heavier than usual. MAKE SURE YOU:  Understand these instructions.  Will watch your condition.  Will get help right away if you are not doing well or get worse.   This information is not intended to replace advice given to you by your health care provider. Make sure you discuss any questions you have with your health care provider.   Document Released: 06/11/2005 Document Revised: 06/16/2013 Document Reviewed: 02/16/2013 Elsevier Interactive Patient Education [employer] Contraception Choices Contraception (birth control) is the use of any methods or devices to prevent pregnancy. Below are some methods to help avoid pregnancy. HORMONAL METHODS   Contraceptive implant. This is a thin, plastic tube containing progesterone hormone. It does not contain estrogen hormone. Your health care provider inserts the tube in the inner part of the upper arm. The tube can remain in place for up to 3 years. After 3 years, the implant must be removed. The implant prevents the ovaries from releasing an egg (ovulation), thickens the cervical mucus to prevent sperm from entering the uterus, and thins the lining of the inside of  the uterus.  Progesterone-only injections. These injections are given every 3 months by your health care provider to prevent pregnancy. This synthetic progesterone hormone stops the ovaries from releasing eggs. It also thickens cervical mucus and changes the uterine lining. This makes it harder for sperm to survive in the uterus.  Birth control pills. These pills contain estrogen and progesterone hormone. They work by preventing the ovaries from releasing eggs (ovulation). They also cause the cervical mucus to thicken, preventing the sperm from entering the uterus. Birth control pills are prescribed by a health care provider.Birth control pills can also be used to treat heavy periods.  Minipill. This type of birth control pill contains only the progesterone hormone. They are taken every day of each month and must be prescribed by your health care provider.  Birth control patch. The patch contains hormones similar to those in birth control pills. It must be changed once a week and is prescribed by a health care provider.  Vaginal ring. The ring contains hormones similar to those in birth control pills. It is left in the vagina for 3 weeks, removed for 1 week, and then a new one is put back in place. The patient must be comfortable inserting and removing the ring from  the vagina.A health care provider's prescription is necessary.  Emergency contraception. Emergency contraceptives prevent pregnancy after unprotected sexual intercourse. This pill can be taken right after sex or up to 5 days after unprotected sex. It is most effective the sooner you take the pills after having sexual intercourse. Most emergency contraceptive pills are available without a prescription. Check with your pharmacist. Do not use emergency contraception as your only form of birth control. BARRIER METHODS   Female condom. This is a thin sheath (latex or rubber) that is worn over the penis during sexual intercourse. It can be used  with spermicide to increase effectiveness.  Female condom. This is a soft, loose-fitting sheath that is put into the vagina before sexual intercourse.  Diaphragm. This is a soft, latex, dome-shaped barrier that must be fitted by a health care provider. It is inserted into the vagina, along with a spermicidal jelly. It is inserted before intercourse. The diaphragm should be left in the vagina for 6 to 8 hours after intercourse.  Cervical cap. This is a round, soft, latex or plastic cup that fits over the cervix and must be fitted by a health care provider. The cap can be left in place for up to 48 hours after intercourse.  Sponge. This is a soft, circular piece of polyurethane foam. The sponge has spermicide in it. It is inserted into the vagina after wetting it and before sexual intercourse.  Spermicides. These are chemicals that kill or block sperm from entering the cervix and uterus. They come in the form of creams, jellies, suppositories, foam, or tablets. They do not require a prescription. They are inserted into the vagina with an applicator before having sexual intercourse. The process must be repeated every time you have sexual intercourse. INTRAUTERINE CONTRACEPTION  Intrauterine device (IUD). This is a T-shaped device that is put in a woman's uterus during a menstrual period to prevent pregnancy. There are 2 types:  Copper IUD. This type of IUD is wrapped in copper wire and is placed inside the uterus. Copper makes the uterus and fallopian tubes produce a fluid that kills sperm. It can stay in place for 10 years.  Hormone IUD. This type of IUD contains the hormone progestin (synthetic progesterone). The hormone thickens the cervical mucus and prevents sperm from entering the uterus, and it also thins the uterine lining to prevent implantation of a fertilized egg. The hormone can weaken or kill the sperm that get into the uterus. It can stay in place for 3-5 years, depending on which type of  IUD is used. PERMANENT METHODS OF CONTRACEPTION  Female tubal ligation. This is when the woman's fallopian tubes are surgically sealed, tied, or blocked to prevent the egg from traveling to the uterus.  Hysteroscopic sterilization. This involves placing a small coil or insert into each fallopian tube. Your doctor uses a technique called hysteroscopy to do the procedure. The device causes scar tissue to form. This results in permanent blockage of the fallopian tubes, so the sperm cannot fertilize the egg. It takes about 3 months after the procedure for the tubes to become blocked. You must use another form of birth control for these 3 months.  Female sterilization. This is when the female has the tubes that carry sperm tied off (vasectomy).This blocks sperm from entering the vagina during sexual intercourse. After the procedure, the man can still ejaculate fluid (semen). NATURAL PLANNING METHODS  Natural family planning. This is not having sexual intercourse or using a barrier method (condom,  diaphragm, cervical cap) on days the woman could become pregnant.  Calendar method. This is keeping track of the length of each menstrual cycle and identifying when you are fertile.  Ovulation method. This is avoiding sexual intercourse during ovulation.  Symptothermal method. This is avoiding sexual intercourse during ovulation, using a thermometer and ovulation symptoms.  Post-ovulation method. This is timing sexual intercourse after you have ovulated. Regardless of which type or method of contraception you choose, it is important that you use condoms to protect against the transmission of sexually transmitted infections (STIs). Talk with your health care provider about which form of contraception is most appropriate for you.   This information is not intended to replace advice given to you by your health care provider. Make sure you discuss any questions you have with your health care provider.   Document  Released: 06/11/2005 Document Revised: 06/16/2013 Document Reviewed: 12/04/2012 Elsevier Interactive Patient Education [employer]

## 2016-03-08 NOTE — Progress Notes (Signed)
25 yo presenting today as an ED follow up for the evaluation of right pelvic pain. Patient reports improvement in her pain but it is still present. She has used the pain medication twice since her ED visit. She denies further episode of emesis. She is sexually active with a female partner. She is not using any forms of contraception  Past Medical History:  Diagnosis Date  . Asthma   . Obesity    No past surgical history on file. Family History  Problem Relation Age of Onset  . Diabetes Mother   . Hypertension Mother   . Diabetes Father   . Hypertension Father    Social History  Substance Use Topics  . Smoking status: Current Some Day Smoker    Packs/day: 1.00    Years: 11.00    Types: Cigarettes  . Smokeless tobacco: Current User  . Alcohol use 0.0 oz/week   Blood pressure 107/64, pulse 61, height 6' (1.829 m), weight 269 lb 9.6 oz (122.3 kg), last menstrual period 02/19/2016. GENERAL: Well-developed, well-nourished female in no acute distress.  ABDOMEN: Soft, nontender, nondistended. No organomegaly. PELVIC: Normal external female genitalia. Vagina is pink and rugated.  Normal discharge. Normal appearing cervix. Uterus is normal in size. No adnexal mass or tenderness. EXTREMITIES: No cyanosis, clubbing, or edema, 2+ distal pulses.  03/01/2016 ultrasound FINDINGS: Uterus  Measurements: 6.3 x 3.5 x 3.8 cm. No fibroids or other mass visualized.  Endometrium  Thickness: 7.5 mm.  No focal abnormality visualized.  Right ovary  Measurements: Marked enlargement of the left ovary measuring 12.1 x 7.8 x 12.8 cm. It complains a complex cystic structure measuring 10.6 x 7.9 x 10.2 cm and exhibits internal debris. There is a thin rim of solid ovarian tissue surrounding it. Vascularity is not abnormal.  Left ovary  Measurements: 3.6 x 1.8 x 2.9 cm. There is a simple appearing cyst measuring 1.7 x 1.4 x 1.4 cm.  Pulsed Doppler evaluation of both ovaries demonstrates  normal low-resistance arterial and venous waveforms within the visualized portions of the solid ovarian tissue on the right. The vascularity on the left appears normal.  Other findings  There is a small to moderate amount of free pelvic fluid.  IMPRESSION: 1. Complex cystic right adnexal mass measuring up to 12.8 cm. The solid ovarian tissue visualized exhibits normal vascularity with no findings suspicious for torsion. The different here includes a hemorrhagic ovarian cyst, tubo-ovarian abscess, or ectopic pregnancy. Reportedly the patient's beta HCG yesterday was less than 5 units. Given the size of this cystic mass, gynecological evaluation is recommended. 2. Normal-appearing left ovary. Normal appearance of the uterus and endometrium. 3. Small or moderate amount of free pelvic fluid.  Electronically Signed   By: David  SwazilandJordan M.D.   On: 03/01/2016 07:28  A/P 25 yo with right complex adnexal mass - Discussed the possible etiology of this ovarian cyst - Will repeat pelvic ultrasound in 10-12 weeks.  - Discussed surgical intervention if the cyst is still present. Also discussed starting birth control to prevent ovulation and recurrence of persistence ovarian cysts - Patient verbalized understanding. She was also encouraged to sign up for myChart to view ultrasound results and contact us with any questions

## 2016-03-21 ENCOUNTER — Inpatient Hospital Stay (HOSPITAL_COMMUNITY): Payer: Self-pay

## 2016-03-21 ENCOUNTER — Inpatient Hospital Stay (HOSPITAL_COMMUNITY)
Admission: AD | Admit: 2016-03-21 | Discharge: 2016-03-21 | Disposition: A | Discharge status: Home / Self Care | Payer: Self-pay | Source: Ambulatory Visit | Attending: Family Medicine | Admitting: Family Medicine

## 2016-03-21 ENCOUNTER — Encounter (HOSPITAL_COMMUNITY): Payer: Self-pay

## 2016-03-21 DIAGNOSIS — N83201 Unspecified ovarian cyst, right side: Secondary | ICD-10-CM | POA: Insufficient documentation

## 2016-03-21 DIAGNOSIS — Z880 Allergy status to penicillin: Secondary | ICD-10-CM | POA: Insufficient documentation

## 2016-03-21 DIAGNOSIS — R1011 Right upper quadrant pain: Secondary | ICD-10-CM | POA: Insufficient documentation

## 2016-03-21 DIAGNOSIS — F17210 Nicotine dependence, cigarettes, uncomplicated: Secondary | ICD-10-CM | POA: Insufficient documentation

## 2016-03-21 LAB — COMPREHENSIVE METABOLIC PANEL
ALT: 9 U/L — ABNORMAL LOW (ref 14–54)
ANION GAP: 4 — AB (ref 5–15)
AST: 13 U/L — ABNORMAL LOW (ref 15–41)
Albumin: 3.5 g/dL (ref 3.5–5.0)
Alkaline Phosphatase: 27 U/L — ABNORMAL LOW (ref 38–126)
BILIRUBIN TOTAL: 0.6 mg/dL (ref 0.3–1.2)
BUN: 8 mg/dL (ref 6–20)
CO2: 23 mmol/L (ref 22–32)
Calcium: 8.3 mg/dL — ABNORMAL LOW (ref 8.9–10.3)
Chloride: 109 mmol/L (ref 101–111)
Creatinine, Ser: 0.75 mg/dL (ref 0.44–1.00)
GFR calc Af Amer: >60 mL/min (ref >60)
Glucose, Bld: 97 mg/dL (ref 65–99)
POTASSIUM: 3.6 mmol/L (ref 3.5–5.1)
Sodium: 136 mmol/L (ref 135–145)
TOTAL PROTEIN: 5.9 g/dL — AB (ref 6.5–8.1)

## 2016-03-21 LAB — URINALYSIS, ROUTINE W REFLEX MICROSCOPIC
Bilirubin Urine: NEGATIVE
Glucose, UA: NEGATIVE mg/dL
Ketones, ur: NEGATIVE mg/dL
Leukocytes, UA: NEGATIVE
Nitrite: NEGATIVE
PROTEIN: NEGATIVE mg/dL
Specific Gravity, Urine: 1.02 (ref 1.005–1.030)
pH: 6 (ref 5.0–8.0)

## 2016-03-21 LAB — CBC
HCT: 36.3 % (ref 36.0–46.0)
HEMOGLOBIN: 13.2 g/dL (ref 12.0–15.0)
MCH: 30.8 pg (ref 26.0–34.0)
MCHC: 36.4 g/dL — ABNORMAL HIGH (ref 30.0–36.0)
MCV: 84.8 fL (ref 78.0–100.0)
PLATELETS: 217 10*3/uL (ref 150–400)
RBC: 4.28 MIL/uL (ref 3.87–5.11)
RDW: 12.1 % (ref 11.5–15.5)
WBC: 6.8 10*3/uL (ref 4.0–10.5)

## 2016-03-21 LAB — URINE MICROSCOPIC-ADD ON: WBC UA: NONE SEEN WBC/hpf (ref 0–5)

## 2016-03-21 LAB — POCT PREGNANCY, URINE: PREG TEST UR: NEGATIVE

## 2016-03-21 MED ORDER — IBUPROFEN 600 MG PO TABS
600.0000 mg | ORAL_TABLET | Freq: Four times a day (QID) | ORAL | Status: DC | PRN
  Administered 2016-03-21: 600 mg via ORAL
  Filled 2016-03-21: qty 1

## 2016-03-21 MED ORDER — IBUPROFEN 600 MG PO TABS: 600.0000 mg | ORAL_TABLET | Freq: Four times a day (QID) | ORAL | 0 refills | Status: DC | PRN

## 2016-03-21 MED ORDER — DICYCLOMINE HCL 20 MG PO TABS: 20.0000 mg | ORAL_TABLET | Freq: Two times a day (BID) | ORAL | 0 refills | Status: DC

## 2016-03-21 MED ORDER — HYDROCODONE-ACETAMINOPHEN 5-325 MG PO TABS
1.0000 | ORAL_TABLET | Freq: Once | ORAL | Status: AC
  Administered 2016-03-21: 1 via ORAL
  Filled 2016-03-21: qty 1

## 2016-03-21 MED ORDER — FAMOTIDINE 20 MG PO TABS: 20.0000 mg | ORAL_TABLET | Freq: Two times a day (BID) | ORAL | 0 refills | Status: DC

## 2016-03-21 MED ORDER — NORGESTIMATE-ETH ESTRADIOL 0.25-35 MG-MCG PO TABS: 1.0000 | ORAL_TABLET | Freq: Every day | ORAL | 11 refills | Status: DC

## 2016-03-21 NOTE — MAU Note (Signed)
Has a really really large cyst on rt ovary.  Has medication she takes for it.  Woke up this morning, was in pain really bad.  Went on to work.  Pain got worse, called and was told to come in.  Surgery has been discussed, was to be monitored for 12wks to see if necessary.

## 2016-03-21 NOTE — MAU Provider Note (Signed)
History     CSN: [id number]  Arrival date and time: 03/21/16 1143   None     Chief Complaint  Patient presents with  . Abdominal Pain   HPI Pt is a 25 year old female who presents with abdominal pain that is now resolved. Patient had severe abdominal pain that lasted about an hour this morning. It was associated with nausea. It resolved spontaneously without taking any medications at patient did not vomit. Patient has not had diarrhea today but did have diarrhea last week. Patient has a known right probable hemorrhagic cyst that's approximately 12 cm. There is a possibility that the cyst ruptured. Patient has no pain during exam. She also wants to start birth control pills to help prevent functional ovarian cysts future  When patient had crackers and peanut butter to take ibuprofen and norco today in the emergency room, she started having a recurrence of the pain which was right upper quadrant.  At this point we ordered a right upper quadrant ultrasound. Ultrasound was negative and pain disappeared in an hour. Patient eats a bland diet of soup water and juice to help manage this right upper quadrant pain. She would like to see GI doctor.  Past Medical History:  Diagnosis Date  . Asthma   . Obesity     Past Surgical History:  Procedure Laterality Date  . NO PAST SURGERIES      Family History  Problem Relation Age of Onset  . Diabetes Mother   . Hypertension Mother   . Diabetes Father   . Hypertension Father     Social History  Substance Use Topics  . Smoking status: Current Some Day Smoker    Packs/day: 1.00    Years: 11.00    Types: Cigarettes  . Smokeless tobacco: Current User  . Alcohol use 0.0 oz/week    Allergies:  Allergies  Allergen Reactions  . Penicillins Rash    Childhood rash Has patient had a PCN reaction causing immediate rash, facial/tongue/throat swelling, SOB or lightheadedness with hypotension: no Has patient had a PCN reaction causing severe rash  involving mucus membranes or skin necrosis:no Has patient had a PCN reaction that required hospitalization no Has patient had a PCN reaction occurring within the last 10 years: no If all of the above answers are "NO", then may proceed with Cephalosporin use.     Prescriptions Prior to Admission  Medication Sig Dispense Refill Last Dose  . HYDROcodone-acetaminophen (NORCO/VICODIN) 5-325 MG tablet Take 1-2 tablets by mouth every 4 (four) hours as needed for moderate pain. 20 tablet 0 Past Week at Unknown time  . ondansetron (ZOFRAN ODT) 4 MG disintegrating tablet Take 1 tablet (4 mg total) by mouth every 8 (eight) hours as needed for nausea or vomiting. 20 tablet 0 Past Week at Unknown time  . promethazine (PHENERGAN) 25 MG tablet Take 1 tablet (25 mg total) by mouth every 6 (six) hours as needed for nausea or vomiting. 30 tablet 0 Past Week at Unknown time  . dicyclomine (BENTYL) 20 MG tablet Take 1 tablet (20 mg total) by mouth 2 (two) times daily. (Patient not taking: Reported on 03/21/2016) 20 tablet 0 Not Taking at Unknown time  . ibuprofen (ADVIL,MOTRIN) 200 MG tablet Take 200 mg by mouth every 6 (six) hours as needed for headache or mild pain.   Not Taking at Unknown time    Review of Systems  Constitutional: Negative.   Respiratory: Negative.   Cardiovascular: Negative.   Gastrointestinal: Positive for abdominal pain.  Genitourinary: Negative.   Neurological: Negative.   Psychiatric/Behavioral: Negative.    Physical Exam   Blood pressure 108/68, pulse 74, temperature 98.3 F (36.8 C), temperature source Oral, resp. rate 18, height 6' (1.829 m), weight 259 lb (117.5 kg), last menstrual period 03/19/2016.  Physical Exam  Vitals reviewed. Constitutional: She is oriented to person, place, and time. She appears well-developed and well-nourished. No distress.  HENT:  Head: Normocephalic and atraumatic.  Eyes: Conjunctivae are normal.  Neck: Neck supple. No thyromegaly present.   Cardiovascular: Normal rate and regular rhythm.   Respiratory: Effort normal and breath sounds normal.  GI: Soft. There is no tenderness. There is no rebound and no guarding.  Musculoskeletal: Normal range of motion. She exhibits no tenderness.  Neurological: She is alert and oriented to person, place, and time.  Skin: Skin is warm and dry.  Psychiatric: She has a normal mood and affect.    MAU Course  Procedures No acute abdomen, no pain on exam.   MDM Ruled out acute abdomen Ruled out gallbladder pathology  Assessment and Plan  25 yo female With right ovarian cyst that is probably functional in origin.  Acute onset of pain could possibly be due to rupture of the cyst.  Patient had some pain in the MAU today after eating crackers. The pain was in the right upper quadrant. This could be due to heartburn or reflux. Patient was prescribed famotidine. Patient is to also resume her Bentyl.   Patient has no abdominal pain at time of discharge and has a benign exam. Patient is to follow-up with her scheduled ultrasound in November to see if the cyst has resolved. Patient is also going to start birth control pills to help prevent future functional cysts.   Patient prescribed ibuprofen 600 mg every 6 hours to help with pain during the day. Patient still has narcotics left over from last ED visit to help with breakthrough pain.  Patient was given [facility] card information so she can get an appointment with the GI doctor.  Jacobie Stamey H. 03/21/2016, 1:54 PM

## 2016-03-21 NOTE — Discharge Instructions (Signed)
Oral Contraception Information Oral contraceptive pills (OCPs) are medicines taken to prevent pregnancy. OCPs work by preventing the ovaries from releasing eggs. The hormones in OCPs also cause the cervical mucus to thicken, preventing the sperm from entering the uterus. The hormones also cause the uterine lining to become thin, not allowing a fertilized egg to attach to the inside of the uterus. OCPs are highly effective when taken exactly as prescribed. However, OCPs do not prevent sexually transmitted diseases (STDs). Safe sex practices, such as using condoms along with the pill, can help prevent STDs.  Before taking the pill, you may have a physical exam and Pap test. Your health care provider may order blood tests. The health care provider will make sure you are a good candidate for oral contraception. Discuss with your health care provider the possible side effects of the OCP you may be prescribed. When starting an OCP, it can take 2 to 3 months for the body to adjust to the changes in hormone levels in your body.  TYPES OF ORAL CONTRACEPTION  The combination pill--This pill contains estrogen and progestin (synthetic progesterone) hormones. The combination pill comes in 21-day, 28-day, or 91-day packs. Some types of combination pills are meant to be taken continuously (365-day pills). With 21-day packs, you do not take pills for 7 days after the last pill. With 28-day packs, the pill is taken every day. The last 7 pills are without hormones. Certain types of pills have more than 21 hormone-containing pills. With 91-day packs, the first 84 pills contain both hormones, and the last 7 pills contain no hormones or contain estrogen only.  The minipill--This pill contains the progesterone hormone only. The pill is taken every day continuously. It is very important to take the pill at the same time each day. The minipill comes in packs of 28 pills. All 28 pills contain the hormone.  ADVANTAGES OF ORAL  CONTRACEPTIVE PILLS  Decreases premenstrual symptoms.   Treats menstrual period cramps.   Regulates the menstrual cycle.   Decreases a heavy menstrual flow.   May treatacne, depending on the type of pill.   Treats abnormal uterine bleeding.   Treats polycystic ovarian syndrome.   Treats endometriosis.   Can be used as emergency contraception.  THINGS THAT CAN MAKE ORAL CONTRACEPTIVE PILLS LESS EFFECTIVE OCPs can be less effective if:   You forget to take the pill at the same time every day.   You have a stomach or intestinal disease that lessens the absorption of the pill.   You take OCPs with other medicines that make OCPs less effective, such as antibiotics, certain HIV medicines, and some seizure medicines.   You take expired OCPs.   You forget to restart the pill on day 7, when using the packs of 21 pills.  RISKS ASSOCIATED WITH ORAL CONTRACEPTIVE PILLS  Oral contraceptive pills can sometimes cause side effects, such as:  Headache.  Nausea.  Breast tenderness.  Irregular bleeding or spotting. Combination pills are also associated with a small increased risk of:  Blood clots.  Heart attack.  Stroke.   This information is not intended to replace advice given to you by your health care provider. Make sure you discuss any questions you have with your health care provider.   Document Released: 09/01/2002 Document Revised: 04/01/2013 Document Reviewed: 11/30/2012 Elsevier Interactive Patient Education 2016 Else[employer] Famotidine tablets or gelcaps What is this medicine? FAMOTIDINE (fa MOE ti deen) is a type of antihistamine that blocks the release of  stomach acid. It is used to treat stomach or intestinal ulcers. It can also relieve heartburn from acid reflux. This medicine may be used for other purposes; ask your health care provider or pharmacist if you have questions. What should I tell my health care provider before I take this  medicine? They need to know if you have any of these conditions: -kidney or liver disease -trouble swallowing -an unusual or allergic reaction to famotidine, other medicines, foods, dyes, or preservatives -pregnant or trying to get pregnant -breast-feeding How should I use this medicine? Take this medicine by mouth with a glass of water. Follow the directions on the prescription label. If you only take this medicine once a day, take it at bedtime. Take your doses at regular intervals. Do not take your medicine more often than directed. Talk to your pediatrician regarding the use of this medicine in children. Special care may be needed. Overdosage: If you think you have taken too much of this medicine contact a poison control center or emergency room at once. NOTE: This medicine is only for you. Do not share this medicine with others. What if I miss a dose? If you miss a dose, take it as soon as you can. If it is almost time for your next dose, take only that dose. Do not take double or extra doses. What may interact with this medicine? -delavirdine -itraconazole -ketoconazole This list may not describe all possible interactions. Give your health care provider a list of all the medicines, herbs, non-prescription drugs, or dietary supplements you use. Also tell them if you smoke, drink alcohol, or use illegal drugs. Some items may interact with your medicine. What should I watch for while using this medicine? Tell your doctor or health care professional if your condition does not start to get better or if it gets worse. Finish the full course of tablets prescribed, even if you feel better. Do not take with aspirin, ibuprofen or other antiinflammatory medicines. These can make your condition worse. Do not smoke cigarettes or drink alcohol. These cause irritation in your stomach and can increase the time it will take for ulcers to heal. If you get black, tarry stools or vomit up what looks like  coffee grounds, call your doctor or health care professional at once. You may have a bleeding ulcer. What side effects may I notice from receiving this medicine? Side effects that you should report to your doctor or health care professional as soon as possible: -agitation, nervousness -confusion -hallucinations -skin rash, itching Side effects that usually do not require medical attention (report to your doctor or health care professional if they continue or are bothersome): -constipation -diarrhea -dizziness -headache This list may not describe all possible side effects. Call your doctor for medical advice about side effects. You may report side effects to FDA at 1-800-FDA-1088. Where should I keep my medicine? Keep out of the reach of children. Store at room temperature between 15 and 30 degrees C (59 and 86 degrees F). Do not freeze. Throw away any unused medicine after the expiration date. NOTE: This sheet is a summary. It may not cover all possible information. If you have questions about this medicine, talk to your doctor, pharmacist, or health care provider.    2016, Elsevier/Gold Standard. (2012-10-01 14:45:49) Heartburn Heartburn is a type of pain or discomfort that can happen in the throat or chest. It is often described as a burning pain. It may also cause a bad taste in the mouth. Heartburn  may feel worse when you lie down or bend over, and it is often worse at night. Heartburn may be caused by stomach contents that move back up into the esophagus (reflux). HOME CARE INSTRUCTIONS Take these actions to decrease your discomfort and to help avoid complications. Diet  Follow a diet as recommended by your health care provider. This may involve avoiding foods and drinks such as:  Coffee and tea (with or without caffeine).  Drinks that contain alcohol.  Energy drinks and sports drinks.  Carbonated drinks or sodas.  Chocolate and cocoa.  Peppermint and mint  flavorings.  Garlic and onions.  Horseradish.  Spicy and acidic foods, including peppers, chili powder, curry powder, vinegar, hot sauces, and barbecue sauce.  Citrus fruit juices and citrus fruits, such as oranges, lemons, and limes.  Tomato-based foods, such as red sauce, chili, salsa, and pizza with red sauce.  Fried and fatty foods, such as donuts, french fries, potato chips, and high-fat dressings.  High-fat meats, such as hot dogs and fatty cuts of red and white meats, such as rib eye steak, sausage, ham, and bacon.  High-fat dairy items, such as whole milk, butter, and cream cheese.  Eat small, frequent meals instead of large meals.  Avoid drinking large amounts of liquid with your meals.  Avoid eating meals during the 2-3 hours before bedtime.  Avoid lying down right after you eat.  Do not exercise right after you eat. General Instructions  Pay attention to any changes in your symptoms.  Take over-the-counter and prescription medicines only as told by your health care provider. Do not take aspirin, ibuprofen, or other NSAIDs unless your health care provider told you to do so.  Do not use any tobacco products, including cigarettes, chewing tobacco, and e-cigarettes. If you need help quitting, ask your health care provider.  Wear loose-fitting clothing. Do not wear anything tight around your waist that causes pressure on your abdomen.  Raise (elevate) the head of your bed about 6 inches (15 cm).  Try to reduce your stress, such as with yoga or meditation. If you need help reducing stress, ask your health care provider.  If you are overweight, reduce your weight to an amount that is healthy for you. Ask your health care provider for guidance about a safe weight loss goal.  Keep all follow-up visits as told by your health care provider. This is important. SEEK MEDICAL CARE IF:  You have new symptoms.  You have unexplained weight loss.  You have difficulty  swallowing, or it hurts to swallow.  You have wheezing or a persistent cough.  Your symptoms do not improve with treatment.  You have frequent heartburn for more than two weeks. SEEK IMMEDIATE MEDICAL CARE IF:  You have pain in your arms, neck, jaw, teeth, or back.  You feel sweaty, dizzy, or light-headed.  You have chest pain or shortness of breath.  You vomit and your vomit looks like blood or coffee grounds.  Your stool is bloody or black.   This information is not intended to replace advice given to you by your health care provider. Make sure you discuss any questions you have with your health care provider.   Document Released: 10/28/2008 Document Revised: 03/02/2015 Document Reviewed: 10/06/2014 Elsevier Interactive Patient Education [employer]

## 2016-05-10 ENCOUNTER — Inpatient Hospital Stay (HOSPITAL_COMMUNITY)
Admission: AD | Admit: 2016-05-10 | Discharge: 2016-05-10 | Disposition: A | Discharge status: Home / Self Care | Payer: Self-pay | Source: Ambulatory Visit | Attending: Obstetrics & Gynecology | Admitting: Obstetrics & Gynecology

## 2016-05-10 ENCOUNTER — Encounter (HOSPITAL_COMMUNITY): Payer: Self-pay | Admitting: *Deleted

## 2016-05-10 ENCOUNTER — Inpatient Hospital Stay (HOSPITAL_COMMUNITY): Payer: Self-pay

## 2016-05-10 DIAGNOSIS — N83201 Unspecified ovarian cyst, right side: Secondary | ICD-10-CM

## 2016-05-10 DIAGNOSIS — R102 Pelvic and perineal pain unspecified side: Secondary | ICD-10-CM

## 2016-05-10 HISTORY — DX: Unspecified ovarian cyst, unspecified side: N83.209

## 2016-05-10 LAB — CBC WITH DIFFERENTIAL/PLATELET
Basophils Absolute: 0 10*3/uL (ref 0.0–0.1)
Basophils Relative: 0 %
EOS ABS: 0.1 10*3/uL (ref 0.0–0.7)
EOS PCT: 1 %
HCT: 36.2 % (ref 36.0–46.0)
Hemoglobin: 12.9 g/dL (ref 12.0–15.0)
LYMPHS ABS: 1.8 10*3/uL (ref 0.7–4.0)
LYMPHS PCT: 17 %
MCH: 30.6 pg (ref 26.0–34.0)
MCHC: 35.6 g/dL (ref 30.0–36.0)
MCV: 85.8 fL (ref 78.0–100.0)
MONO ABS: 0.2 10*3/uL (ref 0.1–1.0)
MONOS PCT: 2 %
Neutro Abs: 8.2 10*3/uL — ABNORMAL HIGH (ref 1.7–7.7)
Neutrophils Relative %: 80 %
PLATELETS: 181 10*3/uL (ref 150–400)
RBC: 4.22 MIL/uL (ref 3.87–5.11)
RDW: 12.1 % (ref 11.5–15.5)
WBC: 10.2 10*3/uL (ref 4.0–10.5)

## 2016-05-10 LAB — COMPREHENSIVE METABOLIC PANEL
ALT: 8 U/L — ABNORMAL LOW (ref 14–54)
ANION GAP: 5 (ref 5–15)
AST: 14 U/L — ABNORMAL LOW (ref 15–41)
Albumin: 3.4 g/dL — ABNORMAL LOW (ref 3.5–5.0)
Alkaline Phosphatase: 29 U/L — ABNORMAL LOW (ref 38–126)
BUN: 6 mg/dL (ref 6–20)
CHLORIDE: 111 mmol/L (ref 101–111)
CO2: 23 mmol/L (ref 22–32)
CREATININE: 0.6 mg/dL (ref 0.44–1.00)
Calcium: 8.4 mg/dL — ABNORMAL LOW (ref 8.9–10.3)
Glucose, Bld: 100 mg/dL — ABNORMAL HIGH (ref 65–99)
POTASSIUM: 3.8 mmol/L (ref 3.5–5.1)
SODIUM: 139 mmol/L (ref 135–145)
Total Bilirubin: 0.9 mg/dL (ref 0.3–1.2)
Total Protein: 5.8 g/dL — ABNORMAL LOW (ref 6.5–8.1)

## 2016-05-10 LAB — URINALYSIS, ROUTINE W REFLEX MICROSCOPIC
Bilirubin Urine: NEGATIVE
Glucose, UA: NEGATIVE mg/dL
KETONES UR: NEGATIVE mg/dL
LEUKOCYTES UA: NEGATIVE
NITRITE: NEGATIVE
PH: 5.5 (ref 5.0–8.0)
Protein, ur: NEGATIVE mg/dL
Specific Gravity, Urine: 1.01 (ref 1.005–1.030)

## 2016-05-10 LAB — URINE MICROSCOPIC-ADD ON
Bacteria, UA: NONE SEEN
WBC UA: NONE SEEN WBC/hpf (ref 0–5)

## 2016-05-10 LAB — LIPASE, BLOOD: LIPASE: 15 U/L (ref 11–51)

## 2016-05-10 LAB — POCT PREGNANCY, URINE: PREG TEST UR: NEGATIVE

## 2016-05-10 MED ORDER — IBUPROFEN 600 MG PO TABS: 600.0000 mg | ORAL_TABLET | Freq: Four times a day (QID) | ORAL | 0 refills | Status: DC | PRN

## 2016-05-10 MED ORDER — NORGESTIMATE-ETH ESTRADIOL 0.25-35 MG-MCG PO TABS: 1.0000 | ORAL_TABLET | Freq: Every day | ORAL | 11 refills | Status: DC

## 2016-05-10 NOTE — MAU Note (Signed)
Pt dx with a ovarian cyst a few months ago. Supposed to f/u on 11/21. Stated she started having n/v a few nights ago. List night had increased abd/pelvic pain at work. Had more n/v at home. Took phenergan without relief.  Vomited 1-2 times today. Thinks it it the cyst that is causing her vomiting.

## 2016-05-10 NOTE — MAU Provider Note (Signed)
Chief Complaint: Emesis   SUBJECTIVE HPI: Leslie Yates is a 25 y.o. G0P0000 at Unknown who presents to [facility] reporting severe right sided pelvic pain.  Pain started Monday (3 days ago). Pain has been progressively worsening, mostly with standing and walking gets unbearable 10/10 pain, but when sitting or laying down pain is 0/10. No urinary symptoms. Has fullness feeling down in vagina, but no abnormal vaginal discharge. Quality: stabbing, constant pain when standing/walking, goes away when sits/lays.  Associated symptoms: N/V (x3), threw up twice this AM, able to tolerate liquids, normal BMs. Denies fevers, chills.   Patient is sexually active with WOMEN only. No female sexual partners. Gets regular STD checks, most recently in April. Only has one partner. Has had h/o STD in high school, trichomonas, was treated.  Patient has not gotten OCPs due to cost. Needs resent now but to [facility] due to cost issues. Never went to see the GI doctor, due to needs further workup for the ovarian cyst.       Past Medical History:  Diagnosis Date  . Asthma   . Obesity   . Ovarian cyst    OB History  Gravida Para Term Preterm AB Living  0 0 0 0 0 0  SAB TAB Ectopic Multiple Live Births  0 0 0 0 0       Past Surgical History:  Procedure Laterality Date  . NO PAST SURGERIES     Social History   Social History  . Marital status: Single    Spouse name: N/A  . Number of children: N/A  . Years of education: N/A   Occupational History  . Not on file.   Social History Main Topics  . Smoking status: Current Some Day Smoker    Packs/day: 1.00    Years: 11.00    Types: Cigarettes  . Smokeless tobacco: Current User  . Alcohol use 0.0 oz/week     Comment: occasional  . Drug use: No  . Sexual activity: Yes    Birth control/ protection: None   Other Topics Concern  . Not on file   Social History Narrative  . No narrative on file   No current facility-administered  medications on file prior to encounter.    Current Outpatient Prescriptions on File Prior to Encounter  Medication Sig Dispense Refill  . HYDROcodone-acetaminophen (NORCO/VICODIN) 5-325 MG tablet Take 1-2 tablets by mouth every 4 (four) hours as needed for moderate pain. 20 tablet 0  . ibuprofen (ADVIL,MOTRIN) 600 MG tablet Take 1 tablet (600 mg total) by mouth every 6 (six) hours as needed for fever or headache. 30 tablet 0  . promethazine (PHENERGAN) 25 MG tablet Take 1 tablet (25 mg total) by mouth every 6 (six) hours as needed for nausea or vomiting. 30 tablet 0  . dicyclomine (BENTYL) 20 MG tablet Take 1 tablet (20 mg total) by mouth 2 (two) times daily. (Patient not taking: Reported on 05/10/2016) 20 tablet 0  . famotidine (PEPCID) 20 MG tablet Take 1 tablet (20 mg total) by mouth 2 (two) times daily. (Patient not taking: Reported on 05/10/2016) 30 tablet 0  . norgestimate-ethinyl estradiol (ORTHO-CYCLEN,SPRINTEC,PREVIFEM) 0.25-35 MG-MCG tablet Take 1 tablet by mouth daily. (Patient not taking: Reported on 05/10/2016) 1 Package 11  . ondansetron (ZOFRAN ODT) 4 MG disintegrating tablet Take 1 tablet (4 mg total) by mouth every 8 (eight) hours as needed for nausea or vomiting. (Patient not taking: Reported on 05/10/2016) 20 tablet 0   Allergies  Allergen  Reactions  . Penicillins Rash    Childhood rash Has patient had a PCN reaction causing immediate rash, facial/tongue/throat swelling, SOB or lightheadedness with hypotension: no Has patient had a PCN reaction causing severe rash involving mucus membranes or skin necrosis:no Has patient had a PCN reaction that required hospitalization no Has patient had a PCN reaction occurring within the last 10 years: no If all of the above answers are "NO", then may proceed with Cephalosporin use.     I have reviewed the past Medical Hx, Surgical Hx, Social Hx, Allergies and Medications.   REVIEW OF SYSTEMS  RESPIRATORY: no cough, shortness of  breath, or wheezing CARDIOVASCULAR: no chest pain or dyspnea on exertion GASTROINTESTINAL: +right sided pelvic pain with standing/walking, denies change in bowel habits, or black or bloody stools GENITO-URINARY: no dysuria, trouble voiding, or hematuria negative for - genital discharge, vulvar/vaginal symptoms or vaginal bleeding MUSKULOSKELETAL: negative for - gait disturbance or swelling in ankle - bilateral, foot - bilateral and leg - bilateral NEUROLOGICAL: negative for - dizziness, gait disturbance, headaches, numbness/tingling or visual changes DERMATOLOGICAL: negative   OBJECTIVE Patient Vitals for the past 24 hrs:  BP Temp Temp src Pulse Resp Height Weight  05/10/16 1155 119/80 98.2 F (36.8 C) Oral 89 18 6\' 1"  (1.854 m) 261 lb 1.9 oz (118.4 kg)    PHYSICAL EXAM Constitutional: Well-developed, well-nourished female in no acute distress.  Cardiovascular: normal rate, rhythm, no murmur Respiratory: normal rate and effort, CTAB GI: Abd soft, non-tender on exam while sitting or laying down, non-distended. Pos BS x 4 MS: Extremities nontender, no edema, normal ROM Neurologic: Alert and oriented x 4.  GU: Neg CVAT. Bimanual: No CMT. No masses palpated. No adnexal tenderness.   LAB RESULTS Results for orders placed or performed during the hospital encounter of 05/10/16 (from the past 24 hour(s))  Urinalysis, Routine w reflex microscopic (not at [facility])     Status: Abnormal   Collection Time: 05/10/16 11:43 AM  Result Value Ref Range   Color, Urine YELLOW YELLOW   APPearance CLEAR CLEAR   Specific Gravity, Urine 1.010 1.005 - 1.030   pH 5.5 5.0 - 8.0   Glucose, UA NEGATIVE NEGATIVE mg/dL   Hgb urine dipstick TRACE (A) NEGATIVE   Bilirubin Urine NEGATIVE NEGATIVE   Ketones, ur NEGATIVE NEGATIVE mg/dL   Protein, ur NEGATIVE NEGATIVE mg/dL   Nitrite NEGATIVE NEGATIVE   Leukocytes, UA NEGATIVE NEGATIVE  Urine microscopic-add on     Status: Abnormal   Collection Time: 05/10/16  11:43 AM  Result Value Ref Range   Squamous Epithelial / LPF 0-5 (A) NONE SEEN   WBC, UA NONE SEEN 0 - 5 WBC/hpf   RBC / HPF 0-5 0 - 5 RBC/hpf   Bacteria, UA NONE SEEN NONE SEEN  Pregnancy, urine POC     Status: None   Collection Time: 05/10/16 12:22 PM  Result Value Ref Range   Preg Test, Ur NEGATIVE NEGATIVE    IMAGING No results found.  MAU COURSE TVUS - persistent right ovarian cyst, normal dopplers (no torsion) Recent STI check was negative, no new partners (partner is female) UA/Urine preg - NEG  3:11 PM - Spoke with on-call attending physician, Dr. Erin Fulling, who agrees to discharge patient but to have close follow up in the clinic for management and possible surgery of ovarian cyst   MDM Plan of care reviewed with patient, including labs and tests ordered and medical treatment. Resent prescription for birth control as was plan from  previous visits.    ASSESSMENT 1. Ovarian cyst, right   2. Pelvic pain   3. Acute pelvic pain, female     PLAN Discharge home in stable condition. Follow up with GYN outpatient for management.  Re-ordered Rx for OCP to pharmacy of patient's choice Motrin as needed for pain control     Medication List    STOP taking these medications   dicyclomine 20 MG tablet Commonly known as:  BENTYL   famotidine 20 MG tablet Commonly known as:  PEPCID   HYDROcodone-acetaminophen 5-325 MG tablet Commonly known as:  NORCO/VICODIN   ondansetron 4 MG disintegrating tablet Commonly known as:  ZOFRAN ODT     TAKE these medications   ibuprofen 600 MG tablet Commonly known as:  ADVIL,MOTRIN Take 1 tablet (600 mg total) by mouth every 6 (six) hours as needed. What changed:  reasons to take this   norgestimate-ethinyl estradiol 0.25-35 MG-MCG tablet Commonly known as:  ORTHO-CYCLEN,SPRINTEC,PREVIFEM Take 1 tablet by mouth daily.   promethazine 25 MG tablet Commonly known as:  PHENERGAN Take 1 tablet (25 mg total) by mouth every 6  (six) hours as needed for nausea or vomiting.        Jen MowElizabeth Mumaw, DO OB Fellow 05/10/2016 1:24 PM

## 2016-05-10 NOTE — Discharge Instructions (Signed)
 Ovarian Cyst  An ovarian cyst is a fluid-filled sac that forms on an ovary. The ovaries are small organs that produce eggs in women. Various types of cysts can form on the ovaries. Some may cause symptoms and require treatment. Most ovarian cysts go away on their own, are not cancerous (are benign), and do not cause problems. Common types of ovarian cysts include:  Functional (follicle) cysts.  Occur during the menstrual cycle, and usually go away with the next menstrual cycle if you do not get pregnant.  Usually cause no symptoms.  Endometriomas.  Are cysts that form from the tissue that lines the uterus (endometrium).  Are sometimes called "chocolate cysts" because they become filled with blood that turns brown.  Can cause pain in the lower abdomen during intercourse and during your period.  Cystadenoma cysts.  Develop from cells on the outside surface of the ovary.  Can get very large and cause lower abdomen pain and pain with intercourse.  Can cause severe pain if they twist or break open (rupture).  Dermoid cysts.  Are sometimes found in both ovaries.  May contain different kinds of body tissue, such as skin, teeth, hair, or cartilage.  Usually do not cause symptoms unless they get very big.  Theca lutein cysts.  Occur when too much of a certain hormone (human chorionic gonadotropin) is produced and overstimulates the ovaries to produce an egg.  Are most common after having procedures used to assist with the conception of a baby (in vitro fertilization). What are the causes? Ovarian cysts may be caused by:  Ovarian hyperstimulation syndrome. This is a condition that can develop from taking fertility medicines. It causes multiple large ovarian cysts to form.  Polycystic ovarian syndrome (PCOS). This is a common hormonal disorder that can cause ovarian cysts, as well as problems with your period or fertility. What increases the risk? The following factors may make  you more likely to develop ovarian cysts:  Being overweight or obese.  Taking fertility medicines.  Taking certain forms of hormonal birth control.  Smoking. What are the signs or symptoms? Many ovarian cysts do not cause symptoms. If symptoms are present, they may include:  Pelvic pain or pressure.  Pain in the lower abdomen.  Pain during sex.  Abdominal swelling.  Abnormal menstrual periods.  Increasing pain with menstrual periods. How is this diagnosed? These cysts are commonly found during a routine pelvic exam. You may have tests to find out more about the cyst, such as:  Ultrasound.  X-ray of the pelvis.  CT scan.  MRI.  Blood tests. How is this treated? Many ovarian cysts go away on their own without treatment. Your health care provider may want to check your cyst regularly for 2-3 months to see if it changes. If you are in menopause, it is especially important to have your cyst monitored closely because menopausal women have a higher rate of ovarian cancer. When treatment is needed, it may include:  Medicines to help relieve pain.  A procedure to drain the cyst (aspiration).  Surgery to remove the whole cyst.  Hormone treatment or birth control pills. These methods are sometimes used to help dissolve a cyst. Follow these instructions at home:  Take over-the-counter and prescription medicines only as told by your health care provider.  Do not drive or use heavy machinery while taking prescription pain medicine.  Get regular pelvic exams and Pap tests as often as told by your health care provider.  Return to   your normal activities as told by your health care provider. Ask your health care provider what activities are safe for you.  Do not use any products that contain nicotine or tobacco, such as cigarettes and e-cigarettes. If you need help quitting, ask your health care provider.  Keep all follow-up visits as told by your health care provider. This is  important. Contact a health care provider if:  Your periods are late, irregular, or painful, or they stop.  You have pelvic pain that does not go away.  You have pressure on your bladder or trouble emptying your bladder completely.  You have pain during sex.  You have any of the following in your abdomen:  A feeling of fullness.  Pressure.  Discomfort.  Pain that does not go away.  Swelling.  You feel generally ill.  You become constipated.  You lose your appetite.  You develop severe acne.  You start to have more body hair and facial hair.  You are gaining weight or losing weight without changing your exercise and eating habits.  You think you may be pregnant. Get help right away if:  You have abdominal pain that is severe or gets worse.  You cannot eat or drink without vomiting.  You suddenly develop a fever.  Your menstrual period is much heavier than usual. This information is not intended to replace advice given to you by your health care provider. Make sure you discuss any questions you have with your health care provider. Document Released: 06/11/2005 Document Revised: 12/30/2015 Document Reviewed: 11/13/2015 Elsevier Interactive Patient Education  2017 [employer] Pelvic Pain, Female Female pelvic pain can be caused by many different things and start from a variety of places. Pelvic pain refers to pain that is located in the lower half of the abdomen and between your hips. The pain may occur over a short period of time (acute) or may be reoccurring (chronic). The cause of pelvic pain may be related to disorders affecting the female reproductive organs (gynecologic), but it may also be related to the bladder, kidney stones, an intestinal complication, or muscle or skeletal problems. Getting help right away for pelvic pain is important, especially if there has been severe, sharp, or a sudden onset of unusual pain. It is also important to get help right away  because some types of pelvic pain can be life threatening.  CAUSES  Below are only some of the causes of pelvic pain. The causes of pelvic pain can be in one of several categories.   Gynecologic.  Pelvic inflammatory disease.  Sexually transmitted infection.  Ovarian cyst or a twisted ovarian ligament (ovarian torsion).  Uterine lining that grows outside the uterus (endometriosis).  Fibroids, cysts, or tumors.  Ovulation.  Pregnancy.  Pregnancy that occurs outside the uterus (ectopic pregnancy).  Miscarriage.  Labor.  Abruption of the placenta or ruptured uterus.  Infection.  Uterine infection (endometritis).  Bladder infection.  Diverticulitis.  Miscarriage related to a uterine infection (septic abortion).  Bladder.  Inflammation of the bladder (cystitis).  Kidney stone(s).  Gastrointestinal.  Constipation.  Diverticulitis.  Neurologic.  Trauma.  Feeling pelvic pain because of mental or emotional causes (psychosomatic).  Cancers of the bowel or pelvis. EVALUATION  Your caregiver will want to take a careful history of your concerns. This includes recent changes in your health, a careful gynecologic history of your periods (menses), and a sexual history. Obtaining your family history and medical history is also important. Your caregiver may suggest a pelvic   exam. A pelvic exam will help identify the location and severity of the pain. It also helps in the evaluation of which organ system may be involved. In order to identify the cause of the pelvic pain and be properly treated, your caregiver may order tests. These tests may include:   A pregnancy test.  Pelvic ultrasonography.  An X-ray exam of the abdomen.  A urinalysis or evaluation of vaginal discharge.  Blood tests. HOME CARE INSTRUCTIONS   Only take over-the-counter or prescription medicines for pain, discomfort, or fever as directed by your caregiver.   Rest as directed by your caregiver.    Eat a balanced diet.   Drink enough fluids to make your urine clear or pale yellow, or as directed.   Avoid sexual intercourse if it causes pain.   Apply warm or cold compresses to the lower abdomen depending on which one helps the pain.   Avoid stressful situations.   Keep a journal of your pelvic pain. Write down when it started, where the pain is located, and if there are things that seem to be associated with the pain, such as food or your menstrual cycle.  Follow up with your caregiver as directed.  SEEK MEDICAL CARE IF:  Your medicine does not help your pain.  You have abnormal vaginal discharge. SEEK IMMEDIATE MEDICAL CARE IF:   You have heavy bleeding from the vagina.   Your pelvic pain increases.   You feel light-headed or faint.   You have chills.   You have pain with urination or blood in your urine.   You have uncontrolled diarrhea or vomiting.   You have a fever or persistent symptoms for more than 3 days.  You have a fever and your symptoms suddenly get worse.   You are being physically or sexually abused. This information is not intended to replace advice given to you by your health care provider. Make sure you discuss any questions you have with your health care provider. Document Released: 05/08/2004 Document Revised: 03/02/2015 Document Reviewed: 04/01/2015 Elsevier Interactive Patient Education  2017 [employer]  

## 2016-05-16 ENCOUNTER — Ambulatory Visit (HOSPITAL_COMMUNITY): Admission: RE | Admit: 2016-05-16 | Payer: Self-pay | Source: Ambulatory Visit

## 2016-08-21 ENCOUNTER — Emergency Department (HOSPITAL_COMMUNITY)
Admission: EM | Admit: 2016-08-21 | Discharge: 2016-08-21 | Disposition: A | Discharge status: Home / Self Care | Payer: Self-pay | Attending: Emergency Medicine | Admitting: Emergency Medicine

## 2016-08-21 ENCOUNTER — Encounter (HOSPITAL_COMMUNITY): Payer: Self-pay

## 2016-08-21 DIAGNOSIS — Z79899 Other long term (current) drug therapy: Secondary | ICD-10-CM | POA: Insufficient documentation

## 2016-08-21 DIAGNOSIS — F17210 Nicotine dependence, cigarettes, uncomplicated: Secondary | ICD-10-CM | POA: Insufficient documentation

## 2016-08-21 DIAGNOSIS — J45909 Unspecified asthma, uncomplicated: Secondary | ICD-10-CM | POA: Insufficient documentation

## 2016-08-21 DIAGNOSIS — R040 Epistaxis: Secondary | ICD-10-CM | POA: Insufficient documentation

## 2016-08-21 MED ORDER — PHENYLEPHRINE HCL 0.5 % NA SOLN
1.0000 [drp] | Freq: Once | NASAL | Status: AC
  Administered 2016-08-21: 1 [drp] via NASAL
  Filled 2016-08-21: qty 15

## 2016-08-21 MED ORDER — SILVER NITRATE-POT NITRATE 75-25 % EX MISC
1.0000 | Freq: Once | CUTANEOUS | Status: AC
  Administered 2016-08-21: 1 via TOPICAL
  Filled 2016-08-21: qty 1

## 2016-08-21 MED ORDER — SALINE SPRAY 0.65 % NA SOLN: 1.0000 | NASAL | 0 refills | Status: DC | PRN

## 2016-08-21 NOTE — ED Triage Notes (Signed)
Per Pt, Pt reports having a cold recently. Pt was at work and she got home. She ran upstairs and blew her nose. Pt reports her nose started bleeding after and the blood started running down her throat. Pt reports of intermittent arm pain from elbow to pinky that has happened twice in the last month.

## 2016-08-21 NOTE — ED Notes (Signed)
Patient comes in with bleeding nose that started at 5:50 after blowing nose. Patient states she hadn't been feeling well recently with cold-like symptoms. Patient states she hasn't been able to get it to stop. As patient talks, bleeding doesn't seem to be uncontrolled. Not much drainage noted. A/ox4.

## 2016-08-21 NOTE — ED Provider Notes (Signed)
[facility]-EMERGENCY DEPT Provider Note   CSN: [id number] Arrival date & time: 08/21/16  1824   By signing my name below, I, Clovis PuAvnee Patel, attest that this documentation has been prepared under the direction and in the presence of  Kerrie BuffaloHope Neese, NP. Electronically Signed: Clovis PuAvnee Patel, ED Scribe. 08/21/16. 7:19 PM.   History   Chief Complaint Chief Complaint  Patient presents with  . Epistaxis   The history is provided by the patient. No language interpreter was used.  Epistaxis   This is a new problem. The current episode started 1 to 2 hours ago. The problem occurs constantly. The problem has been gradually improving. The bleeding has been from the left nare. She has tried nothing for the symptoms. The treatment provided no relief. Her past medical history is significant for colds.   HPI Comments:  Leslie Yates is a 26 y.o. female who presents to the Emergency Department complaining of acute onset nosebleed from the right nostril onset PTA. Pt states she blew her nose when her nose suddenly began to bleed. Pt also reports nasal congestion. No alleviating factors noted. Pt denies any other associated symptoms. No other complaints noted.   Past Medical History:  Diagnosis Date  . Asthma   . Obesity   . Ovarian cyst     There are no active problems to display for this patient.   Past Surgical History:  Procedure Laterality Date  . NO PAST SURGERIES      OB History    Gravida Para Term Preterm AB Living   0 0 0 0 0 0   SAB TAB Ectopic Multiple Live Births   0 0 0 0 0       Home Medications    Prior to Admission medications   Medication Sig Start Date End Date Taking? Authorizing Provider  ibuprofen (ADVIL,MOTRIN) 600 MG tablet Take 1 tablet (600 mg total) by mouth every 6 (six) hours as needed. 05/10/16   Hiram ComberElizabeth Woodland Mumaw, DO  norgestimate-ethinyl estradiol (ORTHO-CYCLEN,SPRINTEC,PREVIFEM) 0.25-35 MG-MCG tablet Take 1 tablet by mouth daily. 05/10/16   Hiram ComberElizabeth  Woodland Mumaw, DO  promethazine (PHENERGAN) 25 MG tablet Take 1 tablet (25 mg total) by mouth every 6 (six) hours as needed for nausea or vomiting. 03/01/16   Gilda Creasehristopher J Pollina, MD  sodium chloride (OCEAN) 0.65 % SOLN nasal spray Place 1 spray into both nostrils as needed for congestion. 08/21/16   Hope Orlene OchM Neese, NP    Family History Family History  Problem Relation Age of Onset  . Diabetes Mother   . Hypertension Mother   . Diabetes Father   . Hypertension Father     Social History Social History  Substance Use Topics  . Smoking status: Current Some Day Smoker    Packs/day: 1.00    Years: 11.00    Types: Cigarettes  . Smokeless tobacco: Current User  . Alcohol use 0.0 oz/week     Comment: occasional     Allergies   Penicillins   Review of Systems Review of Systems  Constitutional: Negative for fever.  HENT: Positive for congestion, nosebleeds and sneezing. Negative for sore throat.   Respiratory: Cough: dry occasional.   Gastrointestinal: Negative for nausea and vomiting.  Neurological: Negative for light-headedness.   Physical Exam Updated Vital Signs BP 110/63 (BP Location: Right Arm)   Pulse 77   Temp 98.6 F (37 C) (Oral)   Resp 18   Ht 5\' 11"  (1.803 m)   Wt 117.5 kg   SpO2  100%   BMI 36.12 kg/m   Physical Exam  Constitutional: She is oriented to person, place, and time. She appears well-developed and well-nourished. No distress.  HENT:  Head: Normocephalic and atraumatic.  Nose: Mucosal edema present.  Nasal mucosa swollen with erythema. No active bleeding from the right nostril.   Eyes: Conjunctivae are normal.  Neck: Neck supple.  Cardiovascular: Normal rate.   Pulmonary/Chest: Effort normal.  Abdominal: She exhibits no distension.  Neurological: She is alert and oriented to person, place, and time.  Skin: Skin is warm and dry.  Psychiatric: She has a normal mood and affect.  Nursing note and vitals reviewed.    ED Treatments / Results    DIAGNOSTIC STUDIES:  Oxygen Saturation is 100% on RA, normal by my interpretation.    COORDINATION OF CARE:  7:18 PM Discussed treatment plan with pt at bedside and pt agreed to plan.  Labs (all labs ordered are listed, but only abnormal results are displayed) Labs Reviewed - No data to display Radiology No results found.  Procedures Procedures (including critical care time)  Medications Ordered in ED Medications  phenylephrine (NEO-SYNEPHRINE) 0.5 % nasal solution 1 drop (1 drop Each Nare Given 08/21/16 1931)  silver nitrate applicators applicator 1 Stick (1 Stick Topical Given 08/21/16 1931)     Initial Impression / Assessment and Plan / ED Course  I have reviewed the triage vital signs and the nursing notes.  At this time there does not appear to be any evidence of an acute emergency medical condition and the patient appears stable for discharge with appropriate outpatient follow up.Diagnosis was discussed with patient who verbalizes understanding and is agreeable to discharge. Instructions to patient regarding use of NS nose spray and follow up.    Final Clinical Impressions(s) / ED Diagnoses   Final diagnoses:  Right-sided epistaxis    New Prescriptions New Prescriptions   SODIUM CHLORIDE (OCEAN) 0.65 % SOLN NASAL SPRAY    Place 1 spray into both nostrils as needed for congestion.   I personally performed the services described in this documentation, which was scribed in my presence. The recorded information has been reviewed and is accurate.    [address] [place], NP 08/21/16 2025    Gerhard Munch, MD 08/21/16 2056

## 2016-08-21 NOTE — ED Notes (Signed)
E-signature not working, pt verbalized understanding of DC instructions and prescriptions  

## 2016-09-26 ENCOUNTER — Encounter (HOSPITAL_COMMUNITY): Payer: Self-pay

## 2016-09-26 ENCOUNTER — Emergency Department (HOSPITAL_COMMUNITY)
Admission: EM | Admit: 2016-09-26 | Discharge: 2016-09-26 | Disposition: A | Discharge status: Home / Self Care | Payer: Self-pay | Attending: Emergency Medicine | Admitting: Emergency Medicine

## 2016-09-26 DIAGNOSIS — Z79899 Other long term (current) drug therapy: Secondary | ICD-10-CM | POA: Insufficient documentation

## 2016-09-26 DIAGNOSIS — J45909 Unspecified asthma, uncomplicated: Secondary | ICD-10-CM | POA: Insufficient documentation

## 2016-09-26 DIAGNOSIS — F17210 Nicotine dependence, cigarettes, uncomplicated: Secondary | ICD-10-CM | POA: Insufficient documentation

## 2016-09-26 DIAGNOSIS — R109 Unspecified abdominal pain: Secondary | ICD-10-CM

## 2016-09-26 DIAGNOSIS — R1031 Right lower quadrant pain: Secondary | ICD-10-CM | POA: Insufficient documentation

## 2016-09-26 LAB — URINALYSIS, ROUTINE W REFLEX MICROSCOPIC
Bilirubin Urine: NEGATIVE
GLUCOSE, UA: NEGATIVE mg/dL
Ketones, ur: NEGATIVE mg/dL
LEUKOCYTES UA: NEGATIVE
NITRITE: NEGATIVE
PH: 5 (ref 5.0–8.0)
PROTEIN: NEGATIVE mg/dL
SPECIFIC GRAVITY, URINE: 1.014 (ref 1.005–1.030)

## 2016-09-26 LAB — POC URINE PREG, ED: Preg Test, Ur: NEGATIVE

## 2016-09-26 MED ORDER — IBUPROFEN 600 MG PO TABS: 600.0000 mg | ORAL_TABLET | Freq: Four times a day (QID) | ORAL | 0 refills | Status: DC | PRN

## 2016-09-26 MED ORDER — IBUPROFEN 400 MG PO TABS
600.0000 mg | ORAL_TABLET | Freq: Once | ORAL | Status: AC
  Administered 2016-09-26: 600 mg via ORAL
  Filled 2016-09-26: qty 1

## 2016-09-26 NOTE — ED Notes (Signed)
Patient denies fever, chills, vomiting.  Complaining of nausea and diarrhea that began last night.  States she has a hx of ovarian cysts.  Denies discharge, dysuria, or frequent urination.

## 2016-09-26 NOTE — ED Triage Notes (Signed)
Patient complains of ongoing RLQ pain, [facility] and they state all related to ovarian cysts. Also has congestion and states that she feels short of breath with deep breath. Alert and oriented, no resp. distress

## 2016-09-26 NOTE — ED Provider Notes (Signed)
[facility] Provider Note   CSN: [id number] Arrival date & time: 09/26/16  1157   By signing my name below, I, [name], attest that this documentation has been prepared under the direction and in the presence of [name], MD . Electronically Signed: [name], ED Scribe. 09/26/2016. 2:01 PM.   History   Chief Complaint Chief Complaint  Patient presents with  . RLQ pain/ cyst, congestion   The history is provided by the patient. No language interpreter was used.   HPI Comments:  Leslie Yates is a 26 y.o. female who presents to the Emergency Department complaining of recurrent right flank pain x 1 year. She states that the pain coincides with her menstrual cycle each month, and notes that the pain is sharp and worse when walking. Pt states that the pain is similar to previous ovarian cysts, and she rates the pain at 8/10. Pt complains of associated SOB that is worse with deep breaths. Pt is sexually active and does not use protection, and she denies any chance of STDs. She was screened for STDs 2 months ago. Pt is seen at [facility]. She has been given birth control, and has been treated with ibuprofen with moderate relief. Currently does not have Ibuprofen for the pain. Denies vaginal discharge, pelvic pain.   Past Medical History:  Diagnosis Date  . Asthma   . Obesity   . Ovarian cyst     There are no active problems to display for this patient.   Past Surgical History:  Procedure Laterality Date  . NO PAST SURGERIES      OB History    Gravida Para Term Preterm AB Living   0 0 0 0 0 0   SAB TAB Ectopic Multiple Live Births   0 0 0 0 0       Home Medications    Prior to Admission medications   Medication Sig Start Date End Date Taking? Authorizing Provider  ibuprofen (ADVIL,MOTRIN) 600 MG tablet Take 1 tablet (600 mg total) by mouth every 6 (six) hours as needed. 09/26/16   [name], MD  norgestimate-ethinyl  estradiol (ORTHO-CYCLEN,SPRINTEC,PREVIFEM) 0.25-35 MG-MCG tablet Take 1 tablet by mouth daily. 05/10/16   [name], DO  promethazine (PHENERGAN) 25 MG tablet Take 1 tablet (25 mg total) by mouth every 6 (six) hours as needed for nausea or vomiting. 03/01/16   [name], MD  sodium chloride (OCEAN) 0.65 % SOLN nasal spray Place 1 spray into both nostrils as needed for congestion. 08/21/16   [name], NP    Family History Family History  Problem Relation Age of Onset  . Diabetes Mother   . Hypertension Mother   . Diabetes Father   . Hypertension Father     Social History Social History  Substance Use Topics  . Smoking status: Current Some Day Smoker    Packs/day: 1.00    Years: 11.00    Types: Cigarettes  . Smokeless tobacco: Current User  . Alcohol use 0.0 oz/week     Comment: occasional     Allergies   Penicillins   Review of Systems Review of Systems 10 Systems reviewed and are negative for acute change except as noted in the HPI.   Physical Exam Updated Vital Signs BP (!) 124/59 (BP Location: Right Arm)   Pulse 62   Temp 98.4 F (36.9 C) (Oral)   Resp 18   SpO2 100%   Physical Exam  Constitutional: She is oriented to person, place, and time. She appears well-developed and well-nourished. No distress.  HENT:  Head: Normocephalic and atraumatic.  Nose: Nose normal.  Eyes: Conjunctivae and EOM are normal. Pupils are equal, round, and reactive to light. Right eye exhibits no discharge. Left eye exhibits no discharge. No scleral icterus.  Neck: Normal range of motion. Neck supple.  Cardiovascular: Normal rate and regular rhythm.  Exam reveals no gallop and no friction rub.   No murmur heard. Pulmonary/Chest: Effort normal and breath sounds normal. No stridor. No respiratory distress. She has no rales.  Abdominal: Soft. She exhibits no distension. There is no tenderness. There is no rigidity, no rebound, no guarding, no CVA tenderness  and negative Murphy's sign.  Musculoskeletal: She exhibits no edema or tenderness.  Neurological: She is alert and oriented to person, place, and time.  Skin: Skin is warm and dry. No rash noted. She is not diaphoretic. No erythema.  Psychiatric: She has a normal mood and affect.  Vitals reviewed.    ED Treatments / Results  DIAGNOSTIC STUDIES:  Oxygen Saturation is 100% on RA, normal by my interpretation.    COORDINATION OF CARE:  2:01 PM Will order motrin. Discussed treatment plan with pt at bedside and pt agreed to plan.   Labs (all labs ordered are listed, but only abnormal results are displayed) Labs Reviewed  URINALYSIS, ROUTINE W REFLEX MICROSCOPIC - Abnormal; Notable for the following:       Result Value   Hgb urine dipstick MODERATE (*)    Bacteria, UA FEW (*)    Squamous Epithelial / LPF 0-5 (*)    All other components within normal limits  POC URINE PREG, ED    EKG  EKG Interpretation None       Radiology No results found.  Procedures Procedures (including critical care time)  Medications Ordered in ED Medications  ibuprofen (ADVIL,MOTRIN) tablet 600 mg (600 mg Oral Given 09/26/16 1403)     Initial Impression / Assessment and Plan / ED Course  I have reviewed the triage vital signs and the nursing notes.  Pertinent labs & imaging results that were available during my care of the patient were reviewed by me and considered in my medical decision making (see chart for details).     Urine pregnancy negative. UA without evidence of infection. Did note hematuria but this is likely secondary to patient currently being on her menstrual cycle. As the pain is similar to prior pain related to the ovarian cyst which is also related to menstrual cycles I have low suspicion for renal colic. Abdomen benign, so I have low suspicion for Ovarian torsion, acute cholecystitis, pancreatitis, appendicitis, small bowel obstruction. Low suspicion for PID.  Patient provided  with Motrin.  The patient is safe for discharge with strict return precautions.   Final Clinical Impressions(s) / ED Diagnoses   Final diagnoses:  Right flank pain   Disposition: Discharge  Condition: Good  I have discussed the results, Dx and Tx plan with the patient who expressed understanding and agree(s) with the plan. Discharge instructions discussed at great length. The patient was given strict return precautions who verbalized understanding of the instructions. No further questions at time of discharge.    Current Discharge Medication List      Follow Up: Primary care provider  Schedule an appointment as soon as possible for a visit  in 5-7 days, If symptoms do not improve or  worsen   I personally performed the  services described in this documentation, which was scribed in my presence. The recorded information has been reviewed and is accurate.        [name], MD 09/26/16 [phone]

## 2016-09-27 ENCOUNTER — Emergency Department (HOSPITAL_COMMUNITY)
Admission: EM | Admit: 2016-09-27 | Discharge: 2016-09-27 | Disposition: A | Discharge status: Home / Self Care | Payer: Self-pay | Attending: Emergency Medicine | Admitting: Emergency Medicine

## 2016-09-27 ENCOUNTER — Emergency Department (HOSPITAL_COMMUNITY): Payer: Self-pay

## 2016-09-27 DIAGNOSIS — R1900 Intra-abdominal and pelvic swelling, mass and lump, unspecified site: Secondary | ICD-10-CM | POA: Insufficient documentation

## 2016-09-27 DIAGNOSIS — R112 Nausea with vomiting, unspecified: Secondary | ICD-10-CM

## 2016-09-27 DIAGNOSIS — F17210 Nicotine dependence, cigarettes, uncomplicated: Secondary | ICD-10-CM | POA: Insufficient documentation

## 2016-09-27 DIAGNOSIS — E876 Hypokalemia: Secondary | ICD-10-CM | POA: Insufficient documentation

## 2016-09-27 DIAGNOSIS — J45909 Unspecified asthma, uncomplicated: Secondary | ICD-10-CM | POA: Insufficient documentation

## 2016-09-27 LAB — URINALYSIS, ROUTINE W REFLEX MICROSCOPIC
BILIRUBIN URINE: NEGATIVE
Bacteria, UA: NONE SEEN
Glucose, UA: NEGATIVE mg/dL
Ketones, ur: 5 mg/dL — AB
LEUKOCYTES UA: NEGATIVE
NITRITE: NEGATIVE
PH: 6 (ref 5.0–8.0)
Protein, ur: 30 mg/dL — AB
SPECIFIC GRAVITY, URINE: 1.018 (ref 1.005–1.030)

## 2016-09-27 LAB — CBC WITH DIFFERENTIAL/PLATELET
BASOS ABS: 0 10*3/uL (ref 0.0–0.1)
Basophils Relative: 0 %
EOS ABS: 0 10*3/uL (ref 0.0–0.7)
EOS PCT: 0 %
HCT: 33.4 % — ABNORMAL LOW (ref 36.0–46.0)
Hemoglobin: 12.1 g/dL (ref 12.0–15.0)
LYMPHS ABS: 0.9 10*3/uL (ref 0.7–4.0)
Lymphocytes Relative: 6 %
MCH: 30.6 pg (ref 26.0–34.0)
MCHC: 36.2 g/dL — ABNORMAL HIGH (ref 30.0–36.0)
MCV: 84.3 fL (ref 78.0–100.0)
MONO ABS: 0.4 10*3/uL (ref 0.1–1.0)
Monocytes Relative: 3 %
Neutro Abs: 12.3 10*3/uL — ABNORMAL HIGH (ref 1.7–7.7)
Neutrophils Relative %: 91 %
PLATELETS: 189 10*3/uL (ref 150–400)
RBC: 3.96 MIL/uL (ref 3.87–5.11)
RDW: 11.8 % (ref 11.5–15.5)
WBC: 13.6 10*3/uL — AB (ref 4.0–10.5)

## 2016-09-27 LAB — GC/CHLAMYDIA PROBE AMP (~~LOC~~) NOT AT ARMC
CHLAMYDIA, DNA PROBE: NEGATIVE
NEISSERIA GONORRHEA: NEGATIVE

## 2016-09-27 LAB — COMPREHENSIVE METABOLIC PANEL
ALT: 13 U/L — AB (ref 14–54)
AST: 21 U/L (ref 15–41)
Albumin: 4.1 g/dL (ref 3.5–5.0)
Alkaline Phosphatase: 35 U/L — ABNORMAL LOW (ref 38–126)
Anion gap: 8 (ref 5–15)
BUN: 11 mg/dL (ref 6–20)
CHLORIDE: 108 mmol/L (ref 101–111)
CO2: 24 mmol/L (ref 22–32)
CREATININE: 0.72 mg/dL (ref 0.44–1.00)
Calcium: 8.4 mg/dL — ABNORMAL LOW (ref 8.9–10.3)
GFR calc non Af Amer: >60 mL/min (ref >60)
Glucose, Bld: 144 mg/dL — ABNORMAL HIGH (ref 65–99)
POTASSIUM: 2.5 mmol/L — AB (ref 3.5–5.1)
SODIUM: 140 mmol/L (ref 135–145)
Total Bilirubin: 1 mg/dL (ref 0.3–1.2)
Total Protein: 6.7 g/dL (ref 6.5–8.1)

## 2016-09-27 LAB — LIPASE, BLOOD: Lipase: 19 U/L (ref 11–51)

## 2016-09-27 MED ORDER — SODIUM CHLORIDE 0.9 % IV SOLN: 30.0000 meq | Freq: Once | INTRAVENOUS | Status: DC

## 2016-09-27 MED ORDER — PROMETHAZINE HCL 25 MG/ML IJ SOLN
12.5000 mg | Freq: Once | INTRAMUSCULAR | Status: AC
  Administered 2016-09-27: 12.5 mg via INTRAVENOUS
  Filled 2016-09-27: qty 1

## 2016-09-27 MED ORDER — IPRATROPIUM-ALBUTEROL 0.5-2.5 (3) MG/3ML IN SOLN: 3.0000 mL | Freq: Once | RESPIRATORY_TRACT | Status: DC

## 2016-09-27 MED ORDER — POTASSIUM CHLORIDE 10 MEQ/100ML IV SOLN
10.0000 meq | INTRAVENOUS | Status: AC
  Administered 2016-09-27 (×3): 10 meq via INTRAVENOUS
  Filled 2016-09-27 (×3): qty 100

## 2016-09-27 MED ORDER — PROMETHAZINE HCL 25 MG PO TABS: 25.0000 mg | ORAL_TABLET | Freq: Four times a day (QID) | ORAL | 0 refills | Status: DC | PRN

## 2016-09-27 MED ORDER — POTASSIUM CHLORIDE CRYS ER 10 MEQ PO TBCR: 20.0000 meq | EXTENDED_RELEASE_TABLET | Freq: Two times a day (BID) | ORAL | 0 refills | Status: DC

## 2016-09-27 MED ORDER — FENTANYL CITRATE (PF) 100 MCG/2ML IJ SOLN
100.0000 ug | Freq: Once | INTRAMUSCULAR | Status: AC
  Administered 2016-09-27: 100 ug via INTRAVENOUS
  Filled 2016-09-27: qty 2

## 2016-09-27 NOTE — ED Triage Notes (Addendum)
[facility] evaluated [facility] for this pain yesterday at 1100, pain started at 0400 09/26/2016. Hx of ovarian cysts. [facility] that since the patient wasn't pregnant she had to be evaluated somewhere else. Pain is worse so pt came here. Given  zofran en route. Pt had emesis in route, pt witnessed by EMS putting her fingers down her throat to "get it out." states that the zofran did not work.

## 2016-09-27 NOTE — ED Provider Notes (Addendum)
[facility]-EMERGENCY DEPT Provider Note: Lowella Dell, MD, FACEP  CSN: [id number] MRN: [id number] ARRIVAL: 09/27/16 at 0003 ROOM: WA15/WA15 By signing my name below, I, Levon Hedger, attest that this documentation has been prepared under the direction and in the presence of Paula Libra, MD . Electronically Signed: Levon Hedger, Scribe. 09/27/2016. 1:56 AM.   CHIEF COMPLAINT  RLQ Pain  HISTORY OF PRESENT ILLNESS  Leslie Yates is a 26 y.o. female who presents to the Emergency Department complaining of progressively worsening, severe RLQ pain onset yesterday afternoon. She was seen at [facility] ED yesterday for the same, but states the pain has significantly worsened since that time. She describes her pain as constant, sharp pain that is exacerbated by walking. She notes associated nausea, vomiting, diarrhea and urinary incontinence. Pt also expresses that she felt hot and became diaphoretic when her pain worsened. She has taken Zofran with no relief of nausea. Of note, pt was had a transvaginal ultrasound on 05/10/16 which showed a 12cm cystic right ovarian mass for which gynecologic consultation was suggested.   Past Medical History:  Diagnosis Date  . Asthma   . Obesity   . Ovarian cyst     Past Surgical History:  Procedure Laterality Date  . NO PAST SURGERIES      Family History  Problem Relation Age of Onset  . Diabetes Mother   . Hypertension Mother   . Diabetes Father   . Hypertension Father     Social History  Substance Use Topics  . Smoking status: Current Some Day Smoker    Packs/day: 1.00    Years: 11.00    Types: Cigarettes  . Smokeless tobacco: Current User  . Alcohol use 0.0 oz/week     Comment: occasional    Prior to Admission medications   Medication Sig Start Date End Date Taking? Authorizing Provider  ibuprofen (ADVIL,MOTRIN) 600 MG tablet Take 1 tablet (600 mg total) by mouth every 6 (six) hours as needed. 09/26/16   [name], MD    promethazine (PHENERGAN) 25 MG tablet Take 1 tablet (25 mg total) by mouth every 6 (six) hours as needed for nausea or vomiting. 09/27/16   Paula Libra, MD    Allergies Penicillins   REVIEW OF SYSTEMS  Negative except as noted here or in the History of Present Illness.   PHYSICAL EXAMINATION  Initial Vital Signs Blood pressure 125/82, pulse (!) 43, resp. rate (!) 21, SpO2 100 %.  Examination General: Well-developed, well-nourished female in no acute distress; appearance consistent with age of record HENT: normocephalic; atraumatic Eyes: pupils equal, round and reactive to light; extraocular muscles intact Neck: supple Heart: regular rate and rhythm Lungs: clear to auscultation bilaterally Abdomen: soft; nondistended; right lower quadrant "pressure" but not pain on palpation; no masses or hepatosplenomegaly; bowel sounds present GU: Mild right CVA tenderness; normal external genitalia; vaginal bleeding; no cervical motion tenderness; right adnexal "pressure" but not pain on palpation Extremities: No deformity; full range of motion; pulses normal Neurologic: Awake, alert and oriented; motor function intact in all extremities and symmetric; no facial droop Skin: Warm and dry Psychiatric: Normal mood and affect   RESULTS  Summary of this visit's results, reviewed by myself:   EKG Interpretation  Date/Time:    Ventricular Rate:    PR Interval:    QRS Duration:   QT Interval:    QTC Calculation:   R Axis:     Text Interpretation:        Laboratory  Studies: Results for orders placed or performed during the hospital encounter of 09/27/16 (from the past 24 hour(s))  CBC with Differential     Status: Abnormal   Collection Time: 09/27/16  2:19 AM  Result Value Ref Range   WBC 13.6 (H) 4.0 - 10.5 K/uL   RBC 3.96 3.87 - 5.11 MIL/uL   Hemoglobin 12.1 12.0 - 15.0 g/dL   HCT 16.1 (L) 09.6 - 04.5 %   MCV 84.3 78.0 - 100.0 fL   MCH 30.6 26.0 - 34.0 pg   MCHC 36.2 (H) 30.0 -  36.0 g/dL   RDW 40.9 81.1 - 91.4 %   Platelets 189 150 - 400 K/uL   Neutrophils Relative % 91 %   Neutro Abs 12.3 (H) 1.7 - 7.7 K/uL   Lymphocytes Relative 6 %   Lymphs Abs 0.9 0.7 - 4.0 K/uL   Monocytes Relative 3 %   Monocytes Absolute 0.4 0.1 - 1.0 K/uL   Eosinophils Relative 0 %   Eosinophils Absolute 0.0 0.0 - 0.7 K/uL   Basophils Relative 0 %   Basophils Absolute 0.0 0.0 - 0.1 K/uL  Comprehensive metabolic panel     Status: Abnormal   Collection Time: 09/27/16  2:19 AM  Result Value Ref Range   Sodium 140 135 - 145 mmol/L   Potassium 2.5 (LL) 3.5 - 5.1 mmol/L   Chloride 108 101 - 111 mmol/L   CO2 24 22 - 32 mmol/L   Glucose, Bld 144 (H) 65 - 99 mg/dL   BUN 11 6 - 20 mg/dL   Creatinine, Ser 7.82 0.44 - 1.00 mg/dL   Calcium 8.4 (L) 8.9 - 10.3 mg/dL   Total Protein 6.7 6.5 - 8.1 g/dL   Albumin 4.1 3.5 - 5.0 g/dL   AST 21 15 - 41 U/L   ALT 13 (L) 14 - 54 U/L   Alkaline Phosphatase 35 (L) 38 - 126 U/L   Total Bilirubin 1.0 0.3 - 1.2 mg/dL   GFR calc non Af Amer >60 >60 mL/min   GFR calc Af Amer >60 >60 mL/min   Anion gap 8 5 - 15  Lipase, blood     Status: None   Collection Time: 09/27/16  2:19 AM  Result Value Ref Range   Lipase 19 11 - 51 U/L  Urinalysis, Routine w reflex microscopic     Status: Abnormal   Collection Time: 09/27/16  5:44 AM  Result Value Ref Range   Color, Urine YELLOW YELLOW   APPearance CLEAR CLEAR   Specific Gravity, Urine 1.018 1.005 - 1.030   pH 6.0 5.0 - 8.0   Glucose, UA NEGATIVE NEGATIVE mg/dL   Hgb urine dipstick LARGE (A) NEGATIVE   Bilirubin Urine NEGATIVE NEGATIVE   Ketones, ur 5 (A) NEGATIVE mg/dL   Protein, ur 30 (A) NEGATIVE mg/dL   Nitrite NEGATIVE NEGATIVE   Leukocytes, UA NEGATIVE NEGATIVE   RBC / HPF 6-30 0 - 5 RBC/hpf   WBC, UA 0-5 0 - 5 WBC/hpf   Bacteria, UA NONE SEEN NONE SEEN   Squamous Epithelial / LPF 0-5 (A) NONE SEEN   Mucous PRESENT    Hyaline Casts, UA PRESENT    Imaging Studies: Ct Renal Stone  Study  Result Date: 09/27/2016 CLINICAL DATA:  Worsening right lower quadrant and right flank pain for several days. Leukocytosis. EXAM: CT ABDOMEN AND PELVIS WITHOUT CONTRAST TECHNIQUE: Multidetector CT imaging of the abdomen and pelvis was performed following the standard protocol without IV contrast. COMPARISON:  Ultrasound 05/10/2016, CT 03/01/2016 FINDINGS: Lower chest: No acute abnormality. Hepatobiliary: No focal liver abnormality is seen. No gallstones, gallbladder wall thickening, or biliary dilatation. Pancreas: Unremarkable. No pancreatic ductal dilatation or surrounding inflammatory changes. Spleen: Normal in size without focal abnormality. Adrenals/Urinary Tract: Adrenal glands are unremarkable. Kidneys are normal, without renal calculi, focal lesion, or hydronephrosis. Bladder is unremarkable. Stomach/Bowel: Stomach is within normal limits. Appendix is normal. No evidence of bowel wall thickening, distention, or inflammatory changes. Vascular/Lymphatic: No significant vascular findings are present. No enlarged abdominal or pelvic lymph nodes. Reproductive: 12 mm midline pelvic cyst, directly above the urinary bladder. This is not significantly changed from 05/10/2016. On ultrasound it appears to be of ovarian origin. Uterus is unremarkable. Other: No focal inflammation. No ascites. Small fat containing umbilical hernia. Musculoskeletal: No significant skeletal lesion. IMPRESSION: *Normal appendix *No focal inflammation. *Unchanged 12 cm midline pelvic cyst Electronically Signed   By: Ellery Plunk M.D.   On: 09/27/2016 04:00    ED COURSE  Nursing notes and initial vitals signs, including pulse oximetry, reviewed.  Vitals:   09/27/16 0004 09/27/16 0033 09/27/16 0216 09/27/16 0436  BP: 113/78 125/82 118/62 113/78  Pulse: (!) 42 (!) 43 (!) 41 (!) 42  Resp: 20 (!) 21 (!) 24 20  SpO2: 96% 100% 100% 96%   2:41 AM Patient somnolent but arousable after fentanyl 100 micrograms IV. Patient not  given Phenergan due to sedation level.  5:26 AM Patient's pain remains improved but Vomiting has returned. Second dose of Phenergan ordered. IV potassium infusing for hypokalemia.  7:08 AM Patient resting comfortably without further vomiting after IV Ativan. She states she would like to try to go home. She will for prescription for Phenergan as Zofran was not effective. We will also prescribe oral potassium supplementation.  PROCEDURES    ED DIAGNOSES     ICD-9-CM ICD-10-CM   1. Pelvic mass in female 789.30 R19.00   2. Nausea and vomiting in adult 787.01 R11.2   3. Hypokalemia due to loss of potassium 276.8 E87.6      I personally performed the services described in this documentation, which was scribed in my presence. The recorded information has been reviewed and is accurate.    Paula Libra, MD 09/27/16 0710    Paula Libra, MD 09/27/16 [phone]

## 2016-09-27 NOTE — ED Notes (Signed)
Bed: ZO10 Expected date:  Expected time:  Means of arrival:  Comments: EMS 26 yo female RLQ abdominal pain-just evaluated at [facility] for same-states pain continues and wants further evaluation

## 2016-09-27 NOTE — ED Notes (Signed)
Alberteen Sam (girlfriend) [phone] Call when patient is going to be discharged

## 2016-09-27 NOTE — ED Notes (Signed)
CRITICAL VALUE ALERT  Critical value received: potassium 2.5  Date of notification:  09/27/2016  Time of notification:  0316  Critical value read back: yes  Nurse who received alert:  S. Laureen Abrahams, RN  MD notified (1st page): Molpus  Time of first page: 0318  MD notified (2nd page):  Time of second page:  Responding MD:  Molpus  Time MD responded:  [phone]

## 2016-09-29 ENCOUNTER — Ambulatory Visit (HOSPITAL_COMMUNITY)
Admission: EM | Admit: 2016-09-29 | Discharge: 2016-09-29 | Disposition: A | Discharge status: Home / Self Care | Payer: Self-pay | Attending: Internal Medicine | Admitting: Internal Medicine

## 2016-09-29 ENCOUNTER — Encounter (HOSPITAL_COMMUNITY): Payer: Self-pay | Admitting: Family Medicine

## 2016-09-29 DIAGNOSIS — R112 Nausea with vomiting, unspecified: Secondary | ICD-10-CM

## 2016-09-29 MED ORDER — ONDANSETRON 4 MG PO TBDP: 4.0000 mg | ORAL_TABLET | Freq: Three times a day (TID) | ORAL | 0 refills | Status: DC | PRN

## 2016-09-29 NOTE — ED Provider Notes (Signed)
CSN: [id number]     Arrival date & time 09/29/16  1605 History   First MD Initiated Contact with Patient 09/29/16 1743     Chief Complaint  Patient presents with  . Nausea  . Abdominal Pain   (Consider location/radiation/quality/duration/timing/severity/associated sxs/prior Treatment) 26 year old female presents to clinic for evaluation of nausea, and abdominal pain. She has been seen twice before at the ER at both [facility], and [facility] and has had received an extensive workup. Pregnancy test was negative, she did have hypokalemia on April 5 and was treated with IV potassium. She presents today stating that she lost her prescription for Phenergan, and she's hungry but has not been able to eat or hold any food down. She denies any chest pain or pressure, denies any heart palpitations, or other symptoms that could be related to electrolyte imbalances.   The history is provided by the patient.  Abdominal Pain  Associated symptoms: nausea and vomiting   Associated symptoms: no chills, no fever, no shortness of breath and no sore throat     Past Medical History:  Diagnosis Date  . Asthma   . Obesity   . Ovarian cyst    Past Surgical History:  Procedure Laterality Date  . NO PAST SURGERIES     Family History  Problem Relation Age of Onset  . Diabetes Mother   . Hypertension Mother   . Diabetes Father   . Hypertension Father    Social History  Substance Use Topics  . Smoking status: Current Some Day Smoker    Packs/day: 1.00    Years: 11.00    Types: Cigarettes  . Smokeless tobacco: Current User  . Alcohol use 0.0 oz/week     Comment: occasional   OB History    Gravida Para Term Preterm AB Living   0 0 0 0 0 0   SAB TAB Ectopic Multiple Live Births   0 0 0 0 0     Review of Systems  Constitutional: Negative for appetite change, chills and fever.  HENT: Negative for congestion, sinus pain, sinus pressure and sore throat.   Respiratory: Negative for choking and  shortness of breath.   Gastrointestinal: Positive for abdominal pain, nausea and vomiting.  Genitourinary: Negative for frequency and urgency.  Musculoskeletal: Negative for myalgias, neck pain and neck stiffness.  Neurological: Negative for dizziness, syncope, weakness, light-headedness and headaches.    Allergies  Penicillins  Home Medications   Prior to Admission medications   Medication Sig Start Date End Date Taking? Authorizing Provider  ibuprofen (ADVIL,MOTRIN) 600 MG tablet Take 1 tablet (600 mg total) by mouth every 6 (six) hours as needed. 09/26/16   [name], MD  ondansetron (ZOFRAN ODT) 4 MG disintegrating tablet Take 1 tablet (4 mg total) by mouth every 8 (eight) hours as needed for nausea or vomiting. 09/29/16   Dorena Bodo, NP  potassium chloride SA (K-DUR,KLOR-CON) 10 MEQ tablet Take 2 tablets (20 mEq total) by mouth 2 (two) times daily. 09/27/16   John Molpus, MD  promethazine (PHENERGAN) 25 MG tablet Take 1 tablet (25 mg total) by mouth every 6 (six) hours as needed for nausea or vomiting. 09/27/16   Paula Libra, MD   Meds Ordered and Administered this Visit  Medications - No data to display  BP 121/69   Pulse (!) 52   Temp 98.6 F (37 C)   Resp 18   LMP 09/28/2016   SpO2 99%  No data found.   Physical  Exam  Constitutional: She is oriented to person, place, and time. She appears well-developed and well-nourished. No distress.  HENT:  Head: Normocephalic and atraumatic.  Right Ear: External ear normal.  Left Ear: External ear normal.  Eyes: Conjunctivae are normal. Right eye exhibits no discharge. Left eye exhibits no discharge.  Cardiovascular: Normal rate and regular rhythm.   Pulmonary/Chest: Effort normal and breath sounds normal.  Abdominal: Soft. Bowel sounds are normal. She exhibits no distension and no mass. There is no tenderness. There is no rebound and no guarding.  Neurological: She is alert and oriented to person, place, and time.   Skin: Skin is warm and dry. Capillary refill takes less than 2 seconds. No rash noted. She is not diaphoretic. No erythema.  Psychiatric: She has a normal mood and affect. Her behavior is normal.  Nursing note and vitals reviewed.   Urgent Care Course     Procedures (including critical care time)  Labs Review Labs Reviewed - No data to display  Imaging Review No results found.      MDM   1. Non-intractable vomiting with nausea, unspecified vomiting type     Abdominal exams unremarkable, patient was given prescription for Zofran for nausea, encouraged to follow up with a gastroenterologist if her symptoms persist.    Dorena Bodo, NP 09/29/16 1827

## 2016-09-29 NOTE — Discharge Instructions (Signed)
For Nausea, I have prescribed Zofran, take 1 tablet under the tongue every 8 hours as needed. I recommend you stop smoking, and stop using mariajuana products, if your symptoms persist, you will need to follow up with a gastroenterologist for further evaluation and care and for diagnosis of the underlying cause of your vomiting.

## 2016-09-29 NOTE — ED Triage Notes (Signed)
Pt here here for continued abd pain and emesis after being seen [facility]. sts that she misplaced her phenergan prescription and has been taking OTC meds but still cannot eat. sts drinking ginger ale and keeping it down.

## 2016-11-09 ENCOUNTER — Emergency Department (HOSPITAL_COMMUNITY): Payer: Self-pay

## 2016-11-09 ENCOUNTER — Encounter (HOSPITAL_COMMUNITY): Payer: Self-pay

## 2016-11-09 ENCOUNTER — Observation Stay (HOSPITAL_COMMUNITY)
Admission: EM | Admit: 2016-11-09 | Discharge: 2016-11-10 | Disposition: A | Discharge status: Home / Self Care | Payer: Self-pay | Attending: Oncology | Admitting: Oncology

## 2016-11-09 DIAGNOSIS — G43A1 Cyclical vomiting, intractable: Secondary | ICD-10-CM | POA: Insufficient documentation

## 2016-11-09 DIAGNOSIS — R102 Pelvic and perineal pain: Secondary | ICD-10-CM | POA: Insufficient documentation

## 2016-11-09 DIAGNOSIS — Z880 Allergy status to penicillin: Secondary | ICD-10-CM

## 2016-11-09 DIAGNOSIS — F1290 Cannabis use, unspecified, uncomplicated: Secondary | ICD-10-CM | POA: Diagnosis present

## 2016-11-09 DIAGNOSIS — O219 Vomiting of pregnancy, unspecified: Secondary | ICD-10-CM | POA: Diagnosis present

## 2016-11-09 DIAGNOSIS — R112 Nausea with vomiting, unspecified: Secondary | ICD-10-CM

## 2016-11-09 DIAGNOSIS — R1115 Cyclical vomiting syndrome unrelated to migraine: Secondary | ICD-10-CM

## 2016-11-09 DIAGNOSIS — R1030 Lower abdominal pain, unspecified: Secondary | ICD-10-CM

## 2016-11-09 DIAGNOSIS — N83201 Unspecified ovarian cyst, right side: Principal | ICD-10-CM | POA: Insufficient documentation

## 2016-11-09 DIAGNOSIS — E669 Obesity, unspecified: Secondary | ICD-10-CM

## 2016-11-09 DIAGNOSIS — J45909 Unspecified asthma, uncomplicated: Secondary | ICD-10-CM | POA: Insufficient documentation

## 2016-11-09 DIAGNOSIS — N83291 Other ovarian cyst, right side: Secondary | ICD-10-CM

## 2016-11-09 DIAGNOSIS — F1220 Cannabis dependence, uncomplicated: Secondary | ICD-10-CM

## 2016-11-09 DIAGNOSIS — F17210 Nicotine dependence, cigarettes, uncomplicated: Secondary | ICD-10-CM | POA: Insufficient documentation

## 2016-11-09 DIAGNOSIS — E872 Acidosis: Secondary | ICD-10-CM

## 2016-11-09 DIAGNOSIS — F12988 Cannabis use, unspecified with other cannabis-induced disorder: Secondary | ICD-10-CM | POA: Insufficient documentation

## 2016-11-09 DIAGNOSIS — Z79899 Other long term (current) drug therapy: Secondary | ICD-10-CM | POA: Insufficient documentation

## 2016-11-09 DIAGNOSIS — Z8249 Family history of ischemic heart disease and other diseases of the circulatory system: Secondary | ICD-10-CM

## 2016-11-09 DIAGNOSIS — E876 Hypokalemia: Secondary | ICD-10-CM

## 2016-11-09 DIAGNOSIS — R1031 Right lower quadrant pain: Secondary | ICD-10-CM

## 2016-11-09 DIAGNOSIS — Z833 Family history of diabetes mellitus: Secondary | ICD-10-CM

## 2016-11-09 LAB — URINALYSIS, ROUTINE W REFLEX MICROSCOPIC
BILIRUBIN URINE: NEGATIVE
Glucose, UA: NEGATIVE mg/dL
KETONES UR: 5 mg/dL — AB
LEUKOCYTES UA: NEGATIVE
NITRITE: NEGATIVE
PH: 7 (ref 5.0–8.0)
Protein, ur: NEGATIVE mg/dL
SPECIFIC GRAVITY, URINE: 1.015 (ref 1.005–1.030)

## 2016-11-09 LAB — COMPREHENSIVE METABOLIC PANEL
ALBUMIN: 3.9 g/dL (ref 3.5–5.0)
ALT: 10 U/L — AB (ref 14–54)
AST: 17 U/L (ref 15–41)
Alkaline Phosphatase: 35 U/L — ABNORMAL LOW (ref 38–126)
Anion gap: 12 (ref 5–15)
BILIRUBIN TOTAL: 0.6 mg/dL (ref 0.3–1.2)
BUN: 7 mg/dL (ref 6–20)
CO2: 18 mmol/L — ABNORMAL LOW (ref 22–32)
Calcium: 8.9 mg/dL (ref 8.9–10.3)
Chloride: 110 mmol/L (ref 101–111)
Creatinine, Ser: 0.89 mg/dL (ref 0.44–1.00)
Glucose, Bld: 121 mg/dL — ABNORMAL HIGH (ref 65–99)
POTASSIUM: 2.8 mmol/L — AB (ref 3.5–5.1)
Sodium: 140 mmol/L (ref 135–145)
TOTAL PROTEIN: 6.4 g/dL — AB (ref 6.5–8.1)

## 2016-11-09 LAB — CBC
HEMATOCRIT: 39.7 % (ref 36.0–46.0)
Hemoglobin: 13.9 g/dL (ref 12.0–15.0)
MCH: 30 pg (ref 26.0–34.0)
MCHC: 35 g/dL (ref 30.0–36.0)
MCV: 85.7 fL (ref 78.0–100.0)
PLATELETS: 213 10*3/uL (ref 150–400)
RBC: 4.63 MIL/uL (ref 3.87–5.11)
RDW: 12 % (ref 11.5–15.5)
WBC: 9.1 10*3/uL (ref 4.0–10.5)

## 2016-11-09 LAB — RAPID URINE DRUG SCREEN, HOSP PERFORMED
Amphetamines: NOT DETECTED
BARBITURATES: NOT DETECTED
Benzodiazepines: NOT DETECTED
COCAINE: NOT DETECTED
Opiates: POSITIVE — AB
Tetrahydrocannabinol: POSITIVE — AB

## 2016-11-09 LAB — HCG, QUANTITATIVE, PREGNANCY: hCG, Beta Chain, Quant, S: <1 m[IU]/mL (ref <5)

## 2016-11-09 LAB — MAGNESIUM: Magnesium: 1.8 mg/dL (ref 1.7–2.4)

## 2016-11-09 LAB — LIPASE, BLOOD: Lipase: 21 U/L (ref 11–51)

## 2016-11-09 LAB — PREGNANCY, URINE: Preg Test, Ur: NEGATIVE

## 2016-11-09 MED ORDER — ENSURE ENLIVE PO LIQD
237.0000 mL | Freq: Two times a day (BID) | ORAL | Status: DC
  Administered 2016-11-10: 237 mL via ORAL

## 2016-11-09 MED ORDER — HALOPERIDOL LACTATE 5 MG/ML IJ SOLN
5.0000 mg | Freq: Once | INTRAMUSCULAR | Status: AC
  Administered 2016-11-09: 5 mg via INTRAVENOUS
  Filled 2016-11-09: qty 1

## 2016-11-09 MED ORDER — POTASSIUM CHLORIDE 10 MEQ/100ML IV SOLN
10.0000 meq | INTRAVENOUS | Status: AC
  Administered 2016-11-09: 10 meq via INTRAVENOUS
  Filled 2016-11-09: qty 100

## 2016-11-09 MED ORDER — DIPHENHYDRAMINE HCL 50 MG/ML IJ SOLN
25.0000 mg | Freq: Once | INTRAMUSCULAR | Status: AC
  Administered 2016-11-09: 25 mg via INTRAVENOUS
  Filled 2016-11-09: qty 1

## 2016-11-09 MED ORDER — SODIUM CHLORIDE 0.9 % IV BOLUS (SEPSIS)
1000.0000 mL | Freq: Once | INTRAVENOUS | Status: AC
  Administered 2016-11-09: 1000 mL via INTRAVENOUS

## 2016-11-09 MED ORDER — ONDANSETRON 4 MG PO TBDP
4.0000 mg | ORAL_TABLET | Freq: Once | ORAL | Status: AC | PRN
  Administered 2016-11-09: 4 mg via ORAL

## 2016-11-09 MED ORDER — MORPHINE SULFATE (PF) 4 MG/ML IV SOLN
4.0000 mg | Freq: Once | INTRAVENOUS | Status: AC
Start: 2016-11-09 — End: 2016-11-09
  Administered 2016-11-09: 4 mg via INTRAVENOUS
  Filled 2016-11-09: qty 1

## 2016-11-09 MED ORDER — PROCHLORPERAZINE EDISYLATE 5 MG/ML IJ SOLN
10.0000 mg | Freq: Four times a day (QID) | INTRAMUSCULAR | Status: DC | PRN
  Administered 2016-11-09 – 2016-11-10 (×2): 10 mg via INTRAVENOUS
  Filled 2016-11-09 (×2): qty 2

## 2016-11-09 MED ORDER — ACETAMINOPHEN 325 MG PO TABS: 650.0000 mg | ORAL_TABLET | Freq: Four times a day (QID) | ORAL | Status: DC | PRN

## 2016-11-09 MED ORDER — ENOXAPARIN SODIUM 60 MG/0.6ML ~~LOC~~ SOLN
60.0000 mg | SUBCUTANEOUS | Status: DC
  Administered 2016-11-09: 60 mg via SUBCUTANEOUS
  Filled 2016-11-09: qty 0.6

## 2016-11-09 MED ORDER — ACETAMINOPHEN 650 MG RE SUPP: 650.0000 mg | Freq: Four times a day (QID) | RECTAL | Status: DC | PRN

## 2016-11-09 MED ORDER — METOCLOPRAMIDE HCL 5 MG/ML IJ SOLN: 10.0000 mg | Freq: Once | INTRAMUSCULAR | Status: DC

## 2016-11-09 MED ORDER — LACTATED RINGERS IV SOLN
INTRAVENOUS | Status: AC
  Administered 2016-11-09: 150 mL/h via INTRAVENOUS

## 2016-11-09 MED ORDER — KETOROLAC TROMETHAMINE 30 MG/ML IJ SOLN
30.0000 mg | Freq: Four times a day (QID) | INTRAMUSCULAR | Status: DC | PRN
  Administered 2016-11-09 – 2016-11-10 (×2): 30 mg via INTRAVENOUS
  Filled 2016-11-09 (×2): qty 1

## 2016-11-09 MED ORDER — POTASSIUM CHLORIDE 10 MEQ/100ML IV SOLN
10.0000 meq | INTRAVENOUS | Status: AC
  Administered 2016-11-09 – 2016-11-10 (×6): 10 meq via INTRAVENOUS
  Filled 2016-11-09 (×6): qty 100

## 2016-11-09 MED ORDER — ONDANSETRON 4 MG PO TBDP
ORAL_TABLET | ORAL | Status: AC
  Filled 2016-11-09: qty 1

## 2016-11-09 MED ORDER — ONDANSETRON HCL 4 MG/2ML IJ SOLN
4.0000 mg | Freq: Once | INTRAMUSCULAR | Status: AC
  Administered 2016-11-09: 4 mg via INTRAVENOUS
  Filled 2016-11-09: qty 2

## 2016-11-09 MED ORDER — BISACODYL 10 MG RE SUPP: 10.0000 mg | Freq: Every day | RECTAL | Status: DC | PRN

## 2016-11-09 NOTE — ED Triage Notes (Signed)
Per [facility], Pt is coming from home with nausea, vomiting, abdominal x 3 days. Pt called EMS has EMS reports sticking her fingers down her throatt to make herself to vomit. Reports hx of ovarian cyst with treatment in place. Vitals per EMS: 138/74, 64 HR, 20 RR, 103 CBG.

## 2016-11-09 NOTE — ED Provider Notes (Signed)
[facility]-EMERGENCY DEPT Provider Note   CSN: [id number] Arrival date & time: 11/09/16  0825     History   Chief Complaint Chief Complaint  Patient presents with  . Abdominal Pain    HPI Leslie Yates is a 26 y.o. female with known history of R hemorrhagic ovarian cyst who presents with a three-day history of right lower quadrant pain. She describes her pain as sharp and constant. Patient has had associated nausea and vomiting. She is unable to tolerate any food. She has had a few episodes of nonbloody diarrhea as well. Patient denies any fevers, chest pain, shortness of breath, back pain, urinary symptoms, abnormal vaginal bleeding or discharge. Patient's LMP was 10/22/2016. Patient has dicyclomine and famotidine at home without relief. Patient reports never seeing OB/GYN about her ovarian cyst due to financial restraints, however patient was evaluated at one point September 2017. Patient reports not seeing them since then. Patient admits to smoking marijuana multiple times a day for 12 years. She also smokes cigarettes.  HPI  Past Medical History:  Diagnosis Date  . Asthma   . Obesity   . Ovarian cyst     Patient Active Problem List   Diagnosis Date Noted  . Vomiting 11/09/2016    Past Surgical History:  Procedure Laterality Date  . NO PAST SURGERIES      OB History    Gravida Para Term Preterm AB Living   0 0 0 0 0 0   SAB TAB Ectopic Multiple Live Births   0 0 0 0 0       Home Medications    Prior to Admission medications   Medication Sig Start Date End Date Taking? Authorizing Provider  ibuprofen (ADVIL,MOTRIN) 600 MG tablet Take 1 tablet (600 mg total) by mouth every 6 (six) hours as needed. Patient not taking: Reported on 11/09/2016 09/26/16   [name], MD  ondansetron (ZOFRAN ODT) 4 MG disintegrating tablet Take 1 tablet (4 mg total) by mouth every 8 (eight) hours as needed for nausea or vomiting. Patient not taking: Reported on 11/09/2016 09/29/16    Dorena Bodo, NP  potassium chloride SA (K-DUR,KLOR-CON) 10 MEQ tablet Take 2 tablets (20 mEq total) by mouth 2 (two) times daily. Patient not taking: Reported on 11/09/2016 09/27/16   Molpus, Jonny Ruiz, MD  promethazine (PHENERGAN) 25 MG tablet Take 1 tablet (25 mg total) by mouth every 6 (six) hours as needed for nausea or vomiting. Patient not taking: Reported on 11/09/2016 09/27/16   Molpus, Jonny Ruiz, MD    Family History Family History  Problem Relation Age of Onset  . Diabetes Mother   . Hypertension Mother   . Diabetes Father   . Hypertension Father     Social History Social History  Substance Use Topics  . Smoking status: Current Some Day Smoker    Packs/day: 1.00    Years: 11.00    Types: Cigarettes  . Smokeless tobacco: Current User  . Alcohol use 0.0 oz/week     Comment: occasional     Allergies   Penicillins   Review of Systems Review of Systems  Constitutional: Negative for chills and fever.  HENT: Negative for facial swelling and sore throat.   Respiratory: Negative for shortness of breath.   Cardiovascular: Negative for chest pain.  Gastrointestinal: Positive for abdominal pain, diarrhea, nausea and vomiting. Negative for blood in stool.  Genitourinary: Negative for dysuria, vaginal bleeding and vaginal discharge.  Musculoskeletal: Negative for back pain.  Skin: Negative for rash  and wound.  Neurological: Negative for headaches.  Psychiatric/Behavioral: The patient is not nervous/anxious.      Physical Exam Updated Vital Signs BP 118/66 (BP Location: Left Arm)   Pulse (!) 40   Resp 14   Ht 6\' 1"  (1.854 m)   Wt 259 lb (117.5 kg)   LMP 10/22/2016   SpO2 100%   BMI 34.17 kg/m   Physical Exam  Constitutional: She appears well-developed and well-nourished. No distress.  HENT:  Head: Normocephalic and atraumatic.  Mouth/Throat: Oropharynx is clear and moist. No oropharyngeal exudate.  Eyes: Conjunctivae are normal. Pupils are equal, round, and reactive  to light. Right eye exhibits no discharge. Left eye exhibits no discharge. No scleral icterus.  Neck: Normal range of motion. Neck supple. No thyromegaly present.  Cardiovascular: Normal rate, regular rhythm, normal heart sounds and intact distal pulses.  Exam reveals no gallop and no friction rub.   No murmur heard. Pulmonary/Chest: Effort normal and breath sounds normal. No stridor. No respiratory distress. She has no wheezes. She has no rales.  Abdominal: Soft. Bowel sounds are normal. She exhibits no distension. There is generalized tenderness. There is no rebound and no guarding.  Musculoskeletal: She exhibits no edema.  Lymphadenopathy:    She has no cervical adenopathy.  Neurological: She is alert. Coordination normal.  Skin: Skin is warm and dry. No rash noted. She is not diaphoretic. No pallor.  Psychiatric: She has a normal mood and affect.  Nursing note and vitals reviewed.    ED Treatments / Results  Labs (all labs ordered are listed, but only abnormal results are displayed) Labs Reviewed  COMPREHENSIVE METABOLIC PANEL - Abnormal; Notable for the following:       Result Value   Potassium 2.8 (*)    CO2 18 (*)    Glucose, Bld 121 (*)    Total Protein 6.4 (*)    ALT 10 (*)    Alkaline Phosphatase 35 (*)    All other components within normal limits  URINALYSIS, ROUTINE W REFLEX MICROSCOPIC - Abnormal; Notable for the following:    Hgb urine dipstick SMALL (*)    Ketones, ur 5 (*)    Bacteria, UA RARE (*)    Squamous Epithelial / LPF 0-5 (*)    All other components within normal limits  LIPASE, BLOOD  CBC  PREGNANCY, URINE  HCG, QUANTITATIVE, PREGNANCY  MAGNESIUM    EKG  EKG Interpretation None       Radiology US Transvaginal Non-ob  Result Date: 11/09/2016 CLINICAL DATA:  Right pelvic pain 6 months. EXAM: TRANSABDOMINAL AND TRANSVAGINAL ULTRASOUND OF PELVIS DOPPLER ULTRASOUND OF OVARIES TECHNIQUE: Both transabdominal and transvaginal ultrasound  examinations of the pelvis were performed. Transabdominal technique was performed for global imaging of the pelvis including uterus, ovaries, adnexal regions, and pelvic cul-de-sac. It was necessary to proceed with endovaginal exam following the transabdominal exam to visualize the endometrium and ovaries. Color and duplex Doppler ultrasound was utilized to evaluate blood flow to the ovaries. COMPARISON:  CT 09/27/2016 FINDINGS: Uterus Measurements: 3.2 x 4.8 x 7.6 cm. No fibroids or other mass visualized. Endometrium Thickness: 9 mm.  No focal abnormality visualized. Right ovary Measurements: 6.9 x 10.5 x 13.1 cm. Large complex cystic mass measuring 5.9 x 8.2 x 10.9 cm with predominant cystic component containing uniform low-level internal echoes. No thick septations or obvious mural nodularity. Left ovary Measurements: 2.2 x 2.3 x 3.5 cm. Normal appearance/no adnexal mass. Pulsed Doppler evaluation of both ovaries demonstrates normal low-resistance  arterial and venous waveforms. Other findings No abnormal free fluid. IMPRESSION: Mildly complicated right ovarian cystic mass measuring 5.9 x 8.2 x 10.9 cm. This likely represents a non neoplastic process such as a large follicular cyst, endometrioma or hemorrhagic cyst. Recommend follow-up ultrasound 6 weeks. Normal uterus and left ovary. Electronically Signed   By: Elberta Fortisaniel  Boyle M.D.   On: 11/09/2016 13:40   [place] Pelvis Complete  Result Date: 11/09/2016 CLINICAL DATA:  Right pelvic pain 6 months. EXAM: TRANSABDOMINAL AND TRANSVAGINAL ULTRASOUND OF PELVIS DOPPLER ULTRASOUND OF OVARIES TECHNIQUE: Both transabdominal and transvaginal ultrasound examinations of the pelvis were performed. Transabdominal technique was performed for global imaging of the pelvis including uterus, ovaries, adnexal regions, and pelvic cul-de-sac. It was necessary to proceed with endovaginal exam following the transabdominal exam to visualize the endometrium and ovaries. Color and duplex  Doppler ultrasound was utilized to evaluate blood flow to the ovaries. COMPARISON:  CT 09/27/2016 FINDINGS: Uterus Measurements: 3.2 x 4.8 x 7.6 cm. No fibroids or other mass visualized. Endometrium Thickness: 9 mm.  No focal abnormality visualized. Right ovary Measurements: 6.9 x 10.5 x 13.1 cm. Large complex cystic mass measuring 5.9 x 8.2 x 10.9 cm with predominant cystic component containing uniform low-level internal echoes. No thick septations or obvious mural nodularity. Left ovary Measurements: 2.2 x 2.3 x 3.5 cm. Normal appearance/no adnexal mass. Pulsed Doppler evaluation of both ovaries demonstrates normal low-resistance arterial and venous waveforms. Other findings No abnormal free fluid. IMPRESSION: Mildly complicated right ovarian cystic mass measuring 5.9 x 8.2 x 10.9 cm. This likely represents a non neoplastic process such as a large follicular cyst, endometrioma or hemorrhagic cyst. Recommend follow-up ultrasound 6 weeks. Normal uterus and left ovary. Electronically Signed   By: Elberta Fortisaniel  Boyle M.D.   On: 11/09/2016 13:40   [place] Art/ven Flow Abd Pelv Doppler  Result Date: 11/09/2016 CLINICAL DATA:  Right pelvic pain 6 months. EXAM: TRANSABDOMINAL AND TRANSVAGINAL ULTRASOUND OF PELVIS DOPPLER ULTRASOUND OF OVARIES TECHNIQUE: Both transabdominal and transvaginal ultrasound examinations of the pelvis were performed. Transabdominal technique was performed for global imaging of the pelvis including uterus, ovaries, adnexal regions, and pelvic cul-de-sac. It was necessary to proceed with endovaginal exam following the transabdominal exam to visualize the endometrium and ovaries. Color and duplex Doppler ultrasound was utilized to evaluate blood flow to the ovaries. COMPARISON:  CT 09/27/2016 FINDINGS: Uterus Measurements: 3.2 x 4.8 x 7.6 cm. No fibroids or other mass visualized. Endometrium Thickness: 9 mm.  No focal abnormality visualized. Right ovary Measurements: 6.9 x 10.5 x 13.1 cm. Large complex  cystic mass measuring 5.9 x 8.2 x 10.9 cm with predominant cystic component containing uniform low-level internal echoes. No thick septations or obvious mural nodularity. Left ovary Measurements: 2.2 x 2.3 x 3.5 cm. Normal appearance/no adnexal mass. Pulsed Doppler evaluation of both ovaries demonstrates normal low-resistance arterial and venous waveforms. Other findings No abnormal free fluid. IMPRESSION: Mildly complicated right ovarian cystic mass measuring 5.9 x 8.2 x 10.9 cm. This likely represents a non neoplastic process such as a large follicular cyst, endometrioma or hemorrhagic cyst. Recommend follow-up ultrasound 6 weeks. Normal uterus and left ovary. Electronically Signed   By: Elberta Fortisaniel  Boyle M.D.   On: 11/09/2016 13:40    Procedures Procedures (including critical care time)  Medications Ordered in ED Medications  ondansetron (ZOFRAN-ODT) 4 MG disintegrating tablet (not administered)  potassium chloride 10 mEq in 100 mL IVPB (0 mEq Intravenous Stopped 11/09/16 1543)  ondansetron (ZOFRAN-ODT) disintegrating tablet 4 mg (4 mg  Oral Given 11/09/16 0838)  morphine 4 MG/ML injection 4 mg (4 mg Intravenous Given 11/09/16 0918)  ondansetron (ZOFRAN) injection 4 mg (4 mg Intravenous Given 11/09/16 0918)  sodium chloride 0.9 % bolus 1,000 mL (0 mLs Intravenous Stopped 11/09/16 1034)  haloperidol lactate (HALDOL) injection 5 mg (5 mg Intravenous Given 11/09/16 1031)  diphenhydrAMINE (BENADRYL) injection 25 mg (25 mg Intravenous Given 11/09/16 1031)     Initial Impression / Assessment and Plan / ED Course  I have reviewed the triage vital signs and the nursing notes.  Pertinent labs & imaging results that were available during my care of the patient were reviewed by me and considered in my medical decision making (see chart for details).     Patient's pain improved with morphine, however her nausea and vomiting persist despite Zofran. Will order Haldol for suspect of cannabis hyperemesis.  Considering lower abdominal pain and tenderness and known ovarian cyst, will order a pelvic ultrasound to assess for torsion pending pregnancy test.  Patient with intractable vomiting despite Haldol, Reglan, Zofran. Abdominal pain is controlled with morphine. CBC is unremarkable. CMP shows hypokalemia, potassium 2.8. Repletion initiated in the ED. Magnesium is 1.8. Lipase 21. Urgency negative. Pelvic ultrasound shows mildly palpated right ovarian cystic mass measuring 5.9 x 8.2 x 10.9 cm again likely representing a non-neoplastic process such as a large pocket cyst, endometrioma or hemorrhagic cyst; recommending follow-up ultrasound in 6 weeks. Considering patient's intractable vomiting and hypokalemia, I will admit patient for further evaluation and treatment. I spoke with internal medicine teaching service who will admit the patient to their service. Advised patient to follow-up with OB/GYN at discharge. Patient understands and agrees with plan. I discussed patient case with Dr. Fayrene Fearing who guided the patient's management and agrees with plan.   Final Clinical Impressions(s) / ED Diagnoses   Final diagnoses:  Intractable cyclical vomiting with nausea  Lower abdominal pain  Right ovarian cyst    New Prescriptions New Prescriptions   No medications on file     Verdis Prime 11/09/16 1550    Rolland Porter, MD 11/09/16 1626

## 2016-11-09 NOTE — H&P (Signed)
Date: 11/09/2016         Patient Name:  Leslie Yates MRN: [id number]  DOB: 1991-04-20 Age / Sex: 26 y.o., female   PCP: Patient, No Pcp Per         Medical Service: Internal Medicine Teaching Service         Attending Physician: Dr. Levert Feinstein, MD    First Contact: Dr. Carolynn Comment Pager: [phone]  Second Contact: Dr. Reggie Pile Pager: [phone]       After Hours (After 5p /  First Contact Pager: [phone]  Weekends / Holidays): Second Contact Pager: [phone]   Chief Complaint: intractable N/V  History of Present Illness: Ms. Leslie Yates is a 26 y.o. female with a h/o of obesity, THC use, h/o ovarian cyst who presents with intractable N/V and RLQ pain.  He is having several days of right lower quadrant pain which was similar to previous pain she's had related to a ovarian cyst. She reported that this pain typically presents monthly around her cycle typically just before or during the start of her period. Patient notes that last night she ate dinner and immediately afterward had significant nausea and vomited all of her food. She has been unable to keep any food or fluid down since. She reports that she had some medications at home for irritation in her stomach but is unsure what his medications are. She reports that she attempted to take them but these made her feel worse. She presented to the emergency department for further evaluation.  Patient has a history of ovarian cyst which has caused her pain and nausea and vomiting in the past. She was following with GYN and was scheduled to have surgery but did not have insurance coverage for this procedure. She was prescribed in oral contraceptive but was unable to afford this and never took it. She can treating this pain as needed with NSAIDs.  Patient does report daily marijuana use. She has not had any other substances. She denies any periods of prolonged abstinence from MJ use recently.  In the ED, pt was HDS w/ hypokalemia  and mild metabolic acidosis. She had a negative UPT. She did not respond to haldol, benadryl, or zofran. She received a pelvic US w/ doppler which demonstrated known R ovarian cyst. She was admitted to IMTS.  Meds: Dicyclomine? Famotidine?  Allergies: Allergies as of 11/09/2016 - Review Complete 11/09/2016  Allergen Reaction Noted  . Penicillins Rash 12/19/2014   Past Medical History:  Diagnosis Date  . Asthma   . Obesity   . Ovarian cyst    Family History: Pt family history includes Diabetes in her father and mother; Hypertension in her father and mother.  Social History: Pt  reports that she has been smoking Cigarettes.  She has a 11.00 pack-year smoking history. She uses smokeless tobacco. She reports that she drinks alcohol. She reports that she does not use drugs.  Review of Systems: ROS per HPI and as below. Review of Systems  Constitutional: Negative for chills, fever and weight loss.  Eyes: Negative for blurred vision.  Respiratory: Negative for cough and shortness of breath.   Cardiovascular: Negative for chest pain and leg swelling.  Gastrointestinal: Positive for abdominal pain, nausea and vomiting. Negative for constipation and diarrhea.  Genitourinary: Negative for dysuria, frequency and urgency.  Musculoskeletal: Negative for myalgias.  Skin: Negative for rash.  Neurological: Negative for dizziness, tremors and headaches.  Endo/Heme/Allergies: Negative for polydipsia.  Psychiatric/Behavioral: The patient is  not nervous/anxious.    Physical Exam: Vitals:   11/09/16 0835 11/09/16 0900 11/09/16 1414  BP: 122/78 135/82 118/66  Pulse: 80 (!) 50 (!) 40  Resp: 18  14  SpO2: 97% 100% 100%  Weight: 259 lb (117.5 kg)    Height: 6\' 1"  (1.854 m)     Physical Exam  Constitutional: She is oriented to person, place, and time. She appears well-developed. She appears lethargic. She is uncooperative. She appears ill. No distress.  HENT:  Head: Normocephalic and  atraumatic.  Right Ear: Hearing normal.  Left Ear: Hearing normal.  Nose: Nose normal.  Mouth/Throat: Mucous membranes are normal.  Cardiovascular: Normal rate, regular rhythm, S1 normal, S2 normal and intact distal pulses.  Exam reveals no gallop.   No murmur heard. Pulmonary/Chest: Effort normal and breath sounds normal. No respiratory distress. She has no wheezes. She has no rhonchi. She has no rales. She exhibits no tenderness. Breasts are symmetrical.  Abdominal: Soft. Normal appearance and bowel sounds are normal. She exhibits no ascites. There is no hepatosplenomegaly. There is tenderness in the right lower quadrant. There is no rigidity, no guarding and no CVA tenderness.  Musculoskeletal: Normal range of motion.  Neurological: She is oriented to person, place, and time. She has normal strength. She appears lethargic.  Skin: Skin is warm, dry and intact. She is not diaphoretic.  Psychiatric: She has a normal mood and affect. Her speech is normal and behavior is normal.   Labs: CBC:  Recent Labs Lab 11/09/16 0842  WBC 9.1  HGB 13.9  HCT 39.7  MCV 85.7  PLT 213   Basic Metabolic Panel:  Recent Labs Lab 11/09/16 0842 11/09/16 1043  NA 140  --   K 2.8*  --   CL 110  --   CO2 18*  --   GLUCOSE 121*  --   BUN 7  --   CREATININE 0.89  --   CALCIUM 8.9  --   MG  --  1.8   Liver Function Tests:  Recent Labs Lab 11/09/16 0842  AST 17  ALT 10*  ALKPHOS 35*  BILITOT 0.6  PROT 6.4*  ALBUMIN 3.9    Recent Labs Lab 11/09/16 0842  LIPASE 21   Urinalysis    Component Value Date/Time   COLORURINE YELLOW 11/09/2016 1305   APPEARANCEUR CLEAR 11/09/2016 1305   LABSPEC 1.015 11/09/2016 1305   PHURINE 7.0 11/09/2016 1305   GLUCOSEU NEGATIVE 11/09/2016 1305   HGBUR SMALL (A) 11/09/2016 1305   BILIRUBINUR NEGATIVE 11/09/2016 1305   KETONESUR 5 (A) 11/09/2016 1305   PROTEINUR NEGATIVE 11/09/2016 1305   NITRITE NEGATIVE 11/09/2016 1305   LEUKOCYTESUR NEGATIVE  11/09/2016 1305   Drugs of Abuse     Component Value Date/Time   LABOPIA NONE DETECTED 06/12/2013 1230   COCAINSCRNUR NONE DETECTED 06/12/2013 1230   LABBENZ NONE DETECTED 06/12/2013 1230   AMPHETMU NONE DETECTED 06/12/2013 1230   THCU POSITIVE (A) 06/12/2013 1230   LABBARB NONE DETECTED 06/12/2013 1230    Imaging:  [place] Transvaginal Non-ob [place] Pelvis Complete [place] Art/ven Flow Abd Pelv Doppler Result Date: 11/09/2016 CLINICAL DATA:  Right pelvic pain 6 months. EXAM: TRANSABDOMINAL AND TRANSVAGINAL ULTRASOUND OF PELVIS DOPPLER ULTRASOUND OF OVARIES TECHNIQUE: Both transabdominal and transvaginal ultrasound examinations of the pelvis were performed. Transabdominal technique was performed for global imaging of the pelvis including uterus, ovaries, adnexal regions, and pelvic cul-de-sac. It was necessary to proceed with endovaginal exam following the transabdominal exam to visualize the endometrium  and ovaries. Color and duplex Doppler ultrasound was utilized to evaluate blood flow to the ovaries. COMPARISON:  CT 09/27/2016 FINDINGS: Uterus Measurements: 3.2 x 4.8 x 7.6 cm. No fibroids or other mass visualized. Endometrium Thickness: 9 mm.  No focal abnormality visualized. Right ovary Measurements: 6.9 x 10.5 x 13.1 cm. Large complex cystic mass measuring 5.9 x 8.2 x 10.9 cm with predominant cystic component containing uniform low-level internal echoes. No thick septations or obvious mural nodularity. Left ovary Measurements: 2.2 x 2.3 x 3.5 cm. Normal appearance/no adnexal mass. Pulsed Doppler evaluation of both ovaries demonstrates normal low-resistance arterial and venous waveforms. Other findings No abnormal free fluid. IMPRESSION: Mildly complicated right ovarian cystic mass measuring 5.9 x 8.2 x 10.9 cm. This likely represents a non neoplastic process such as a large follicular cyst, endometrioma or hemorrhagic cyst. Recommend follow-up ultrasound 6 weeks. Normal uterus and left ovary. Electronically  Signed   By: Elberta Fortis M.D.   On: 11/09/2016 13:40   Assessment & Plan by Problem: Active Problems:   Vomiting  Leslie Yates is a 26 y.o. female with obesity and THC abuse who presents with RLQ pain and intractable N/V.  1) Nausea / Vomiting: possible related to THC hyperemesis. Has chronic episodes seen previously on multiple ED visits. Will treat symptomatically. - admit to med-surg - IVF until taking PO, LR 150cc/hr - compazine q6h PRN - Toradol q6PRN - consider amitryptilline if remains refractory  2) R Ovarian Cyst: c/o RLQ pain today. Noted to have cyst on prior US 03/01/16, referred to GYN who recommended observation and f/u US which showed stable cystic mass. OCP recommended, but never started d/t cost. Treated symptomatically with NSAIDs. 2 ED visits in April [id number] for N/V, RLQ pain. Repeat US today demonstrates stable cystic mass. 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 Recommended cessation. - repeat UDS  4) Hypokalemia: 2/2 poor PO intake in the setting of N/V. Replete. - KCl  5) Metabolic acidosis: mild w/ CO2 of 18, no AG. Possible ingestion, but pt denies, f/u UDS. Give LR for hydration instead of NS.  DVT PPx - low molecular weight heparin  Code Status - Full  Dispo: Admit patient to Observation with expected length of stay less than 2 midnights.  Signed: Carolynn Comment, MD 11/09/2016, 2:58 PM  Pager: [phone]

## 2016-11-09 NOTE — ED Notes (Signed)
Pt aware that urine sample is needed.  

## 2016-11-10 DIAGNOSIS — G43A0 Cyclical vomiting, not intractable: Secondary | ICD-10-CM

## 2016-11-10 DIAGNOSIS — F12288 Cannabis dependence with other cannabis-induced disorder: Secondary | ICD-10-CM

## 2016-11-10 DIAGNOSIS — N83201 Unspecified ovarian cyst, right side: Secondary | ICD-10-CM

## 2016-11-10 LAB — BASIC METABOLIC PANEL
ANION GAP: 7 (ref 5–15)
BUN: 6 mg/dL (ref 6–20)
CO2: 21 mmol/L — AB (ref 22–32)
Calcium: 8.8 mg/dL — ABNORMAL LOW (ref 8.9–10.3)
Chloride: 110 mmol/L (ref 101–111)
Creatinine, Ser: 0.83 mg/dL (ref 0.44–1.00)
GFR calc Af Amer: >60 mL/min (ref >60)
Glucose, Bld: 103 mg/dL — ABNORMAL HIGH (ref 65–99)
POTASSIUM: 3.7 mmol/L (ref 3.5–5.1)
Sodium: 138 mmol/L (ref 135–145)

## 2016-11-10 LAB — HIV ANTIBODY (ROUTINE TESTING W REFLEX): HIV SCREEN 4TH GENERATION: NONREACTIVE

## 2016-11-10 MED ORDER — PROMETHAZINE HCL 25 MG/ML IJ SOLN: 25.0000 mg | Freq: Once | INTRAMUSCULAR | Status: DC

## 2016-11-10 MED ORDER — IBUPROFEN 600 MG PO TABS: 600.0000 mg | ORAL_TABLET | Freq: Four times a day (QID) | ORAL | Status: DC | PRN

## 2016-11-10 MED ORDER — PROCHLORPERAZINE MALEATE 10 MG PO TABS
10.0000 mg | ORAL_TABLET | Freq: Four times a day (QID) | ORAL | Status: DC | PRN
  Filled 2016-11-10: qty 1

## 2016-11-10 MED ORDER — PROCHLORPERAZINE MALEATE 10 MG PO TABS: 10.0000 mg | ORAL_TABLET | Freq: Four times a day (QID) | ORAL | 0 refills | Status: DC | PRN

## 2016-11-10 MED ORDER — IBUPROFEN 600 MG PO TABS: 600.0000 mg | ORAL_TABLET | Freq: Four times a day (QID) | ORAL | 0 refills | Status: DC | PRN

## 2016-11-10 NOTE — Progress Notes (Signed)
Patient refused to get orthostatic vital signs, notified Dr. Marinda ElkStrelow.  Will continue to monitor.

## 2016-11-10 NOTE — Care Management Note (Signed)
Case Management Note  Patient Details  Name: Leslie Yates MRN: [id number] Date of Birth: 8/23/[name]1992  Subjective/Objective:                 CM consult for medication assistance. New Rx at DC are compazine and Advil. No assistance available for OTC medications, Advil. [facility]   Action/Plan:  DC to home.  Expected Discharge Date:  11/10/16               Expected Discharge Plan:  Home/Self Care  In-House Referral:     Discharge planning Services  CM Consult  Post Acute Care Choice:    Choice offered to:     DME Arranged:    DME Agency:     HH Arranged:    HH Agency:     Status of Service:  Completed, signed off  If discussed at [employer] Length of Stay Meetings, dates discussed:    Additional Comments:  Lawerance SabalDebbie Zavia Pullen, RN 11/10/2016, 11:59 AM

## 2016-11-10 NOTE — Discharge Summary (Signed)
Name: Leslie Yates MRN: [id number] DOB: Oct 20, 1990 26 y.o. PCP: Patient, No Pcp Per  Date of Admission: 11/09/2016  8:44 AM Date of Discharge: 11/10/2016 Attending Physician: Levert FeinsteinGranfortuna, James M, MD  Discharge Diagnosis: Principal Problem:   Nausea with vomiting Active Problems:   Cannabinoid hyperemesis syndrome (HCC)   Ovarian cyst, right  Discharge Medications: Allergies as of 11/10/2016      Reactions   Penicillins Rash   Childhood rash Has patient had a PCN reaction causing immediate rash, facial/tongue/throat swelling, SOB or lightheadedness with hypotension: no Has patient had a PCN reaction causing severe rash involving mucus membranes or skin necrosis:no Has patient had a PCN reaction that required hospitalization no Has patient had a PCN reaction occurring within the last 10 years: no If all of the above answers are "NO", then may proceed with Cephalosporin use.      Medication List    STOP taking these medications   ondansetron 4 MG disintegrating tablet Commonly known as:  ZOFRAN ODT   potassium chloride 10 MEQ tablet Commonly known as:  K-DUR,KLOR-CON   promethazine 25 MG tablet Commonly known as:  PHENERGAN     TAKE these medications   ibuprofen 600 MG tablet Commonly known as:  ADVIL,MOTRIN Take 1 tablet (600 mg total) by mouth every 6 (six) hours as needed for moderate pain or cramping. What changed:  reasons to take this   prochlorperazine 10 MG tablet Commonly known as:  COMPAZINE Take 1 tablet (10 mg total) by mouth every 6 (six) hours as needed for nausea or vomiting.       Disposition and follow-up:   Leslie Yates was discharged [facility] in Stable condition.  At the hospital follow up visit please address:  1.  N/V: Assess for recurrence of symptoms and control on compazine. Assess THC use and possible contribution to symptoms. RLQ Pain / Ovarian Cyst: Assess for recurrence of symptoms. Ensure f/u with GYN  for possible resection. Consider OCP, but weigh risk and benefits (concern for DVT w/ tobacco abuse).  Follow-up Appointments: Follow-up Information    [facility]. Schedule an appointment as soon as possible for a visit in 1 week(s).   Why:  Someone should call you next week to set up a hospital follow up appointment. Contact information: [address] [place] [phone]       [facility]. Call on 11/12/2016.   Specialty:  Obstetrics and Gynecology Why:  Call to make a hospital follow up appointment on Monday. Contact information: [address], [address] [place] [id number] [phone]          Hospital Course by problem list: Principal Problem:   Nausea with vomiting Active Problems:   Cannabinoid hyperemesis syndrome (HCC)   Ovarian cyst, right   1) Nausea / Vomiting: Patient presented with intractable nausea and vomiting 1 day. Patient reports that she has had similar symptoms in the past that seemed to be associated with right lower quadrant pain. Patient also has daily marijuana use and there was concern for THC hyperemesis syndrome. Patient was treated symptomatically with IV fluids, Compazine, Toradol and seemed to respond well to these medications the following morning. Patient also took a hot shower which seemed to improve her symptoms significant only. Patient was discharged with oral Compazine to use as needed as well as high-dose ibuprofen 600 mg as needed. Patient was also recommended to follow-up with  GYN for consideration of resection of her ovarian cyst and possibility of oral Ceftin therapy for prevention of future cysts. Patient will also be established with the [facility] clinic for primary care.  2) R Ovarian Cyst: Patient is a history of right ovarian cyst which was diagnosed in September 2017. She was received followed by GYN who recommended  resection but the patient could not have this completed due to financial barriers. Patient was lost to follow-up and has presented recently with complaints of right lower quadrant pain accompanying her nausea and vomiting in the emergency department most recently in April. Patient had a repeat pelvic ultrasound which showed unchanged right ovarian cyst. Recommend outpatient follow-up with GYN and consideration of oral contraceptive to prevent future cysts.  3) THC Abuse:Using for recreation only.May be contributing to N/V, recommend cessation. 4) Hypokalemia:Patient was hyperkalemic to 2.8 and had IV potassium repleted. Potassium was 3.7 at the time of discharge. 5) Metabolic acidosis:Patient had a mild metabolic acidosis with CO2 18 and mild anion gap of 12 with a few ketones in her urine. I suspect that this is related to starvation ketosis in the setting of nausea vomiting and poor by mouth intake. This began to resolve by the time of discharge.  Discharge Vitals:   BP 115/75 (BP Location: Left Arm)   Pulse (!) 58   Temp 98.5 F (36.9 C) (Oral)   Resp 16   Ht 6\' 1"  (1.854 m)   Wt 258 lb 4.8 oz (117.2 kg)   LMP 10/22/2016   SpO2 100%   BMI 34.08 kg/m   Pertinent Labs, Studies, and Procedures: As above.  Procedures Performed:   US Transvaginal Non-ob US Pelvis Complete US Art/ven Flow Abd Pelv Doppler Result Date: 11/09/2016 IMPRESSION: Mildly complicated right ovarian cystic mass measuring 5.9 x 8.2 x 10.9 cm. This likely represents a non neoplastic process such as a large follicular cyst, endometrioma or hemorrhagic cyst. Recommend follow-up ultrasound 6 weeks. Normal uterus and left ovary.  Discharge Instructions: Discharge Instructions    Call MD for:  persistant dizziness or light-headedness    Complete by:  As directed    Call MD for:  persistant nausea and vomiting    Complete by:  As directed    Call MD for:  severe uncontrolled pain    Complete by:  As directed     Diet - low sodium heart healthy    Complete by:  As directed    Discharge instructions    Complete by:  As directed    Your nausea and vomiting may be related to your MJ use. We would recommend that you try to discontinue this to see if your symptoms improve. We have prescribed a medication called compazine that you can use as needed for your nausea and vomiting. We also recommend that you discuss taking an oral contraceptive pill to help prevent ovulation which may be contributing to the pain you are having from your ovarian cyst. These medication may have side-effects, however, including increasing your risk of blood clots, especially due to your current tobacco use. I will give you a high-dose ibuprofen prescription which you can use as needed for pain. You can take this medication alternately with Tylenol, but do not use it with over medications such as Motrin, Aleve, or Advil.  We would like you to follow up with our clinic in the [facility] on the 1st floor of the [facility]. We may be able to assist you  with medication assistance and acquiring insurance. We would also like you to follow up with the OBGYN doctors.   Increase activity slowly    Complete by:  As directed      Signed: Carolynn Comment, MD 11/10/2016, 11:30 AM   Pager: [phone]

## 2016-11-10 NOTE — Progress Notes (Signed)
   Subjective: Currently, the patient feels much improved. Still with some N/V ON and this AM, but responded well to compazine and Toradol. Also benefited from hot shower.  Pt was later reporting recurrence of her RLQ pain and significant discomfort.  Objective: Vital signs in last 24 hours: Vitals:   11/09/16 1414 11/09/16 1632 11/09/16 2141 11/10/16 0529  BP: 118/66 (!) 123/56 (!) 94/48 115/75  Pulse: (!) 40 (!) 44  (!) 58  Resp: 14 16 16 16   Temp:  98.5 F (36.9 C) 99.2 F (37.3 C) 98.5 F (36.9 C)  TempSrc:  Oral Oral Oral  SpO2: 100% 100% 99% 100%  Weight:  265 lb 14.4 oz (120.6 kg) 258 lb 4.8 oz (117.2 kg)   Height:   6\' 1"  (1.854 m)    Physical Exam: Physical Exam  Constitutional: She appears well-developed. She is cooperative. No distress.  Cardiovascular: Normal rate, regular rhythm, normal heart sounds and normal pulses.  Exam reveals no gallop.   No murmur heard. Pulmonary/Chest: Effort normal and breath sounds normal. No respiratory distress. Breasts are symmetrical.  Abdominal: Soft. Bowel sounds are normal. There is no tenderness.  Musculoskeletal: She exhibits no edema.   Labs: CBC:  Recent Labs Lab 11/09/16 0842  WBC 9.1  HGB 13.9  HCT 39.7  MCV 85.7  PLT 213   Metabolic Panel:  Recent Labs Lab 11/09/16 0842 11/09/16 1043 11/10/16 0506  NA 140  --  138  K 2.8*  --  3.7  CL 110  --  110  CO2 18*  --  21*  GLUCOSE 121*  --  103*  BUN 7  --  6  CREATININE 0.89  --  0.83  CALCIUM 8.9  --  8.8*  MG  --  1.8  --   ALT 10*  --   --   ALKPHOS 35*  --   --   BILITOT 0.6  --   --   PROT 6.4*  --   --   ALBUMIN 3.9  --   --   LIPASE 21  --   --     Medications: Scheduled Medications: . enoxaparin (LOVENOX) injection  60 mg Subcutaneous Q24H  . feeding supplement (ENSURE ENLIVE)  237 mL Oral BID BM   PRN Medications: acetaminophen **OR** acetaminophen, bisacodyl, ketorolac, prochlorperazine  Assessment/Plan: Ms. Leslie Yates is a 26 y.o.  female with obesity and THC abuse who presents with RLQ pain and intractable N/V.  1) Nausea / Vomiting: Improved with compazine, able to eat some this AM. [facility] Menstrual associated w/ ovarian cyst. Recommend hot showers. - switch to PO compazine q6h PRN - switch to PO ibuprofen 600mg  q6h - consider amitryptilline if remains refractory  2) R Ovarian Cyst: Pain improved this AM w/ Toradol, but then recurred. Continue symptomatic treatment. Consider OCP, though pt may be at increased risk of blood clots d/t current tobacco use. [facility] and referral to GYN. 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 4) Hypokalemia: Resolved. 5) Metabolic acidosis: likely 2/2 to mild starvation ketosis d/t N/V. Resolving today.  Length of Stay: 0 day(s) Dispo: Anticipated discharge today.  Carolynn CommentStrelow, Caitlin Hillmer, MD Pager: [phone] (7AM-5PM) 11/10/2016, 11:00 AM

## 2016-11-10 NOTE — Progress Notes (Signed)
Patient Discharge: Disposition: Patient discharged to home. Education: Reviewed medications, prescriptions, follow-up appointments and discharge instructions, understood and acknowledged. IV: Discontinued before discharge. Telemetry: N/A Transportation: patient escorted out of the unit in w/c. Belongings: patient took all her belongings with her.

## 2016-11-11 ENCOUNTER — Encounter (HOSPITAL_COMMUNITY): Payer: Self-pay | Admitting: Emergency Medicine

## 2016-11-11 ENCOUNTER — Emergency Department (HOSPITAL_COMMUNITY)
Admission: EM | Admit: 2016-11-11 | Discharge: 2016-11-11 | Disposition: A | Discharge status: Home / Self Care | Payer: Self-pay | Attending: Emergency Medicine | Admitting: Emergency Medicine

## 2016-11-11 DIAGNOSIS — R1114 Bilious vomiting: Secondary | ICD-10-CM | POA: Insufficient documentation

## 2016-11-11 DIAGNOSIS — J45909 Unspecified asthma, uncomplicated: Secondary | ICD-10-CM | POA: Insufficient documentation

## 2016-11-11 DIAGNOSIS — F419 Anxiety disorder, unspecified: Secondary | ICD-10-CM | POA: Insufficient documentation

## 2016-11-11 DIAGNOSIS — F17210 Nicotine dependence, cigarettes, uncomplicated: Secondary | ICD-10-CM | POA: Insufficient documentation

## 2016-11-11 LAB — URINALYSIS, ROUTINE W REFLEX MICROSCOPIC
BACTERIA UA: NONE SEEN
Bilirubin Urine: NEGATIVE
GLUCOSE, UA: NEGATIVE mg/dL
Ketones, ur: NEGATIVE mg/dL
LEUKOCYTES UA: NEGATIVE
Nitrite: NEGATIVE
PH: 8 (ref 5.0–8.0)
Protein, ur: 100 mg/dL — AB
SPECIFIC GRAVITY, URINE: 1.011 (ref 1.005–1.030)
SQUAMOUS EPITHELIAL / LPF: NONE SEEN

## 2016-11-11 LAB — I-STAT CHEM 8, ED
BUN: 5 mg/dL — AB (ref 6–20)
CALCIUM ION: 1.13 mmol/L — AB (ref 1.15–1.40)
CHLORIDE: 104 mmol/L (ref 101–111)
CREATININE: 0.7 mg/dL (ref 0.44–1.00)
GLUCOSE: 112 mg/dL — AB (ref 65–99)
HCT: 38 % (ref 36.0–46.0)
Hemoglobin: 12.9 g/dL (ref 12.0–15.0)
POTASSIUM: 3.5 mmol/L (ref 3.5–5.1)
Sodium: 142 mmol/L (ref 135–145)
TCO2: 23 mmol/L (ref 0–100)

## 2016-11-11 LAB — LIPASE, BLOOD: LIPASE: 31 U/L (ref 11–51)

## 2016-11-11 LAB — COMPREHENSIVE METABOLIC PANEL
ALT: 11 U/L — AB (ref 14–54)
AST: 14 U/L — AB (ref 15–41)
Albumin: 4.1 g/dL (ref 3.5–5.0)
Alkaline Phosphatase: 32 U/L — ABNORMAL LOW (ref 38–126)
Anion gap: 6 (ref 5–15)
BUN: 8 mg/dL (ref 6–20)
CHLORIDE: 107 mmol/L (ref 101–111)
CO2: 23 mmol/L (ref 22–32)
CREATININE: 0.68 mg/dL (ref 0.44–1.00)
Calcium: 8.8 mg/dL — ABNORMAL LOW (ref 8.9–10.3)
GFR calc non Af Amer: >60 mL/min (ref >60)
Glucose, Bld: 112 mg/dL — ABNORMAL HIGH (ref 65–99)
POTASSIUM: 3.3 mmol/L — AB (ref 3.5–5.1)
SODIUM: 136 mmol/L (ref 135–145)
Total Bilirubin: 1.2 mg/dL (ref 0.3–1.2)
Total Protein: 6.8 g/dL (ref 6.5–8.1)

## 2016-11-11 LAB — CBC
HCT: 37 % (ref 36.0–46.0)
Hemoglobin: 13.2 g/dL (ref 12.0–15.0)
MCH: 30.5 pg (ref 26.0–34.0)
MCHC: 35.7 g/dL (ref 30.0–36.0)
MCV: 85.5 fL (ref 78.0–100.0)
PLATELETS: 194 10*3/uL (ref 150–400)
RBC: 4.33 MIL/uL (ref 3.87–5.11)
RDW: 12 % (ref 11.5–15.5)
WBC: 10.7 10*3/uL — AB (ref 4.0–10.5)

## 2016-11-11 LAB — I-STAT BETA HCG BLOOD, ED (MC, WL, AP ONLY)

## 2016-11-11 MED ORDER — LORAZEPAM 2 MG/ML IJ SOLN
1.0000 mg | Freq: Once | INTRAMUSCULAR | Status: AC
  Administered 2016-11-11: 1 mg via INTRAVENOUS
  Filled 2016-11-11: qty 1

## 2016-11-11 MED ORDER — SODIUM CHLORIDE 0.9 % IV BOLUS (SEPSIS)
1000.0000 mL | Freq: Once | INTRAVENOUS | Status: AC
  Administered 2016-11-11: 1000 mL via INTRAVENOUS

## 2016-11-11 MED ORDER — LORAZEPAM 1 MG PO TABS: 1.0000 mg | ORAL_TABLET | Freq: Three times a day (TID) | ORAL | 0 refills | Status: DC | PRN

## 2016-11-11 NOTE — ED Triage Notes (Signed)
Patient in from home with complaints of abdominal pain, nausea, vomiting. Reports being discharged from [facility] yesterday for same including low potassium. No relief since going home.

## 2016-11-11 NOTE — ED Notes (Signed)
Family at bedside. 

## 2016-11-11 NOTE — ED Notes (Signed)
ED Provider at bedside. 

## 2016-11-11 NOTE — Discharge Instructions (Signed)
As discussed, today's evaluation has been generally reassuring. It is important that you follow-up with your primary care physician and your gynecologist. Please take all medication as directed. Return here for concerning changes in your condition.

## 2016-11-11 NOTE — ED Provider Notes (Signed)
[facility]-EMERGENCY DEPT Provider Note   CSN: [id number] Arrival date & time: 11/11/16  1117     History   Chief Complaint Chief Complaint  Patient presents with  . Abdominal Pain  . Nausea  . Emesis    HPI Leslie Yates is a 26 y.o. female.  HPI  She presents with concern of shakiness, nausea, generalized discomfort. She has a notable history of hospitalization, with discharge yesterday after presenting there 2 days ago due to nausea, vomiting. Patient has history of overhead cyst as well as cannabinoid hyperemesis syndrome.  She notes that she has not smoked marijuana since just prior to that evaluation. Since discharge yesterday patient has had persistent anorexia, nausea, no additional vomiting, as she has had negligible oral intake. She describes diffuse shakiness, anxiety over her condition. She has not yet followed up with gynecology, though this is the intention.   Past Medical History:  Diagnosis Date  . Asthma   . Obesity   . Ovarian cyst     Patient Active Problem List   Diagnosis Date Noted  . Ovarian cyst, right 11/10/2016  . Nausea with vomiting 11/09/2016  . Cannabinoid hyperemesis syndrome (HCC) 11/09/2016    Past Surgical History:  Procedure Laterality Date  . NO PAST SURGERIES      OB History    Gravida Para Term Preterm AB Living   0 0 0 0 0 0   SAB TAB Ectopic Multiple Live Births   0 0 0 0 0       Home Medications    Prior to Admission medications   Medication Sig Start Date End Date Taking? Authorizing Provider  ibuprofen (ADVIL,MOTRIN) 600 MG tablet Take 1 tablet (600 mg total) by mouth every 6 (six) hours as needed for moderate pain or cramping. 11/10/16  Yes Carolynn Comment, MD  prochlorperazine (COMPAZINE) 10 MG tablet Take 1 tablet (10 mg total) by mouth every 6 (six) hours as needed for nausea or vomiting. 11/10/16  Yes Carolynn Comment, MD    Family History Family History  Problem Relation Age of Onset  . Diabetes Mother   .  Hypertension Mother   . Diabetes Father   . Hypertension Father     Social History Social History  Substance Use Topics  . Smoking status: Current Some Day Smoker    Packs/day: 1.00    Years: 11.00    Types: Cigarettes  . Smokeless tobacco: Current User  . Alcohol use 0.0 oz/week     Comment: occasional     Allergies   Penicillins   Review of Systems Review of Systems  Constitutional:       Per HPI, otherwise negative  HENT:       Per HPI, otherwise negative  Respiratory:       Per HPI, otherwise negative  Cardiovascular:       Per HPI, otherwise negative  Gastrointestinal: Positive for nausea. Negative for vomiting.  Endocrine:       Negative aside from HPI  Genitourinary:       Neg aside from HPI   Musculoskeletal:       Per HPI, otherwise negative  Skin: Negative.   Neurological: Negative for syncope.  Psychiatric/Behavioral: The patient is nervous/anxious.      Physical Exam Updated Vital Signs BP (!) 147/86 (BP Location: Right Arm)   Pulse (!) 53   Temp 99.1 F (37.3 C) (Oral)   Resp 18   LMP 10/22/2016   SpO2 100%   Physical Exam  Constitutional: She is oriented to person, place, and time. She appears well-developed and well-nourished. No distress.  Large young female patient awake and alert  HENT:  Head: Normocephalic and atraumatic.  Eyes: Conjunctivae and EOM are normal.  Cardiovascular: Normal rate and regular rhythm.   Pulmonary/Chest: Effort normal and breath sounds normal. No stridor. No respiratory distress.  Abdominal: She exhibits no distension and no mass. There is no tenderness. There is no rebound and no guarding.  No tenderness to palpation, though the patient notes that when she had abdominal pain previously was throughout the lower abdomen.  Musculoskeletal: She exhibits no edema.  Neurological: She is alert and oriented to person, place, and time. No cranial nerve deficit.  Skin: Skin is warm and dry.  Psychiatric: She has a  normal mood and affect.  Nursing note and vitals reviewed.    ED Treatments / Results  Labs (all labs ordered are listed, but only abnormal results are displayed) Labs Reviewed  CBC - Abnormal; Notable for the following:       Result Value   WBC 10.7 (*)    All other components within normal limits  I-STAT CHEM 8, ED - Abnormal; Notable for the following:    BUN 5 (*)    Glucose, Bld 112 (*)    Calcium, Ion 1.13 (*)    All other components within normal limits  LIPASE, BLOOD  COMPREHENSIVE METABOLIC PANEL  URINALYSIS, ROUTINE W REFLEX MICROSCOPIC  I-STAT BETA HCG BLOOD, ED ([facility], [facility], [facility] ONLY)     Radiology [place] Transvaginal Non-ob  Result Date: 11/09/2016 CLINICAL DATA:  Right pelvic pain 6 months. EXAM: TRANSABDOMINAL AND TRANSVAGINAL ULTRASOUND OF PELVIS DOPPLER ULTRASOUND OF OVARIES TECHNIQUE: Both transabdominal and transvaginal ultrasound examinations of the pelvis were performed. Transabdominal technique was performed for global imaging of the pelvis including uterus, ovaries, adnexal regions, and pelvic cul-de-sac. It was necessary to proceed with endovaginal exam following the transabdominal exam to visualize the endometrium and ovaries. Color and duplex Doppler ultrasound was utilized to evaluate blood flow to the ovaries. COMPARISON:  CT 09/27/2016 FINDINGS: Uterus Measurements: 3.2 x 4.8 x 7.6 cm. No fibroids or other mass visualized. Endometrium Thickness: 9 mm.  No focal abnormality visualized. Right ovary Measurements: 6.9 x 10.5 x 13.1 cm. Large complex cystic mass measuring 5.9 x 8.2 x 10.9 cm with predominant cystic component containing uniform low-level internal echoes. No thick septations or obvious mural nodularity. Left ovary Measurements: 2.2 x 2.3 x 3.5 cm. Normal appearance/no adnexal mass. Pulsed Doppler evaluation of both ovaries demonstrates normal low-resistance arterial and venous waveforms. Other findings No abnormal free fluid. IMPRESSION: Mildly complicated  right ovarian cystic mass measuring 5.9 x 8.2 x 10.9 cm. This likely represents a non neoplastic process such as a large follicular cyst, endometrioma or hemorrhagic cyst. Recommend follow-up ultrasound 6 weeks. Normal uterus and left ovary. Electronically Signed   By: Elberta Fortisaniel  Boyle M.D.   On: 11/09/2016 13:40   [place] Pelvis Complete  Result Date: 11/09/2016 CLINICAL DATA:  Right pelvic pain 6 months. EXAM: TRANSABDOMINAL AND TRANSVAGINAL ULTRASOUND OF PELVIS DOPPLER ULTRASOUND OF OVARIES TECHNIQUE: Both transabdominal and transvaginal ultrasound examinations of the pelvis were performed. Transabdominal technique was performed for global imaging of the pelvis including uterus, ovaries, adnexal regions, and pelvic cul-de-sac. It was necessary to proceed with endovaginal exam following the transabdominal exam to visualize the endometrium and ovaries. Color and duplex Doppler ultrasound was utilized to evaluate blood flow to the ovaries. COMPARISON:  CT 09/27/2016 FINDINGS:  Uterus Measurements: 3.2 x 4.8 x 7.6 cm. No fibroids or other mass visualized. Endometrium Thickness: 9 mm.  No focal abnormality visualized. Right ovary Measurements: 6.9 x 10.5 x 13.1 cm. Large complex cystic mass measuring 5.9 x 8.2 x 10.9 cm with predominant cystic component containing uniform low-level internal echoes. No thick septations or obvious mural nodularity. Left ovary Measurements: 2.2 x 2.3 x 3.5 cm. Normal appearance/no adnexal mass. Pulsed Doppler evaluation of both ovaries demonstrates normal low-resistance arterial and venous waveforms. Other findings No abnormal free fluid. IMPRESSION: Mildly complicated right ovarian cystic mass measuring 5.9 x 8.2 x 10.9 cm. This likely represents a non neoplastic process such as a large follicular cyst, endometrioma or hemorrhagic cyst. Recommend follow-up ultrasound 6 weeks. Normal uterus and left ovary. Electronically Signed   By: Elberta Fortis M.D.   On: 11/09/2016 13:40   [place] Art/ven  Flow Abd Pelv Doppler  Result Date: 11/09/2016 CLINICAL DATA:  Right pelvic pain 6 months. EXAM: TRANSABDOMINAL AND TRANSVAGINAL ULTRASOUND OF PELVIS DOPPLER ULTRASOUND OF OVARIES TECHNIQUE: Both transabdominal and transvaginal ultrasound examinations of the pelvis were performed. Transabdominal technique was performed for global imaging of the pelvis including uterus, ovaries, adnexal regions, and pelvic cul-de-sac. It was necessary to proceed with endovaginal exam following the transabdominal exam to visualize the endometrium and ovaries. Color and duplex Doppler ultrasound was utilized to evaluate blood flow to the ovaries. COMPARISON:  CT 09/27/2016 FINDINGS: Uterus Measurements: 3.2 x 4.8 x 7.6 cm. No fibroids or other mass visualized. Endometrium Thickness: 9 mm.  No focal abnormality visualized. Right ovary Measurements: 6.9 x 10.5 x 13.1 cm. Large complex cystic mass measuring 5.9 x 8.2 x 10.9 cm with predominant cystic component containing uniform low-level internal echoes. No thick septations or obvious mural nodularity. Left ovary Measurements: 2.2 x 2.3 x 3.5 cm. Normal appearance/no adnexal mass. Pulsed Doppler evaluation of both ovaries demonstrates normal low-resistance arterial and venous waveforms. Other findings No abnormal free fluid. IMPRESSION: Mildly complicated right ovarian cystic mass measuring 5.9 x 8.2 x 10.9 cm. This likely represents a non neoplastic process such as a large follicular cyst, endometrioma or hemorrhagic cyst. Recommend follow-up ultrasound 6 weeks. Normal uterus and left ovary. Electronically Signed   By: Elberta Fortis M.D.   On: 11/09/2016 13:40    Procedures Procedures (including critical care time)  Medications Ordered in ED Medications  sodium chloride 0.9 % bolus 1,000 mL (not administered)  LORazepam (ATIVAN) injection 1 mg (not administered)     Initial Impression / Assessment and Plan / ED Course  I have reviewed the triage vital signs and the  nursing notes.  Pertinent labs & imaging results that were available during my care of the patient were reviewed by me and considered in my medical decision making (see chart for details).  After the initial evaluation patient received IV fluids, axial lysis. Chart review notable for 5 prior ED visits within the past 6 months, including evaluation 2 days ago with hospitalization. Patient has extensive evaluation of her abdominal condition, including labs, ultrasound performed within the past 48 hours demonstrating right adnexal mass concerning for complex cyst.  On repeat exam the patient is sleeping. I discussed the findings and the patient's mother. With reassuring findings here, resolution of anxiousness, tachycardia following this medication, fluids, low evidence for new pathology, patient discharged in stable condition with additional assistance for follow-up. Final Clinical Impressions(s) / ED Diagnoses  Anxiousness Nausea and vomiting   Gerhard Munch, MD 11/11/16 1330

## 2016-11-12 ENCOUNTER — Encounter (HOSPITAL_COMMUNITY): Payer: Self-pay | Admitting: Emergency Medicine

## 2016-11-12 ENCOUNTER — Emergency Department (HOSPITAL_COMMUNITY)
Admission: EM | Admit: 2016-11-12 | Discharge: 2016-11-12 | Disposition: A | Discharge status: Home / Self Care | Payer: Self-pay | Attending: Emergency Medicine | Admitting: Emergency Medicine

## 2016-11-12 DIAGNOSIS — J45909 Unspecified asthma, uncomplicated: Secondary | ICD-10-CM | POA: Insufficient documentation

## 2016-11-12 DIAGNOSIS — R112 Nausea with vomiting, unspecified: Secondary | ICD-10-CM | POA: Insufficient documentation

## 2016-11-12 DIAGNOSIS — Z79899 Other long term (current) drug therapy: Secondary | ICD-10-CM | POA: Insufficient documentation

## 2016-11-12 DIAGNOSIS — F17210 Nicotine dependence, cigarettes, uncomplicated: Secondary | ICD-10-CM | POA: Insufficient documentation

## 2016-11-12 DIAGNOSIS — E876 Hypokalemia: Secondary | ICD-10-CM | POA: Insufficient documentation

## 2016-11-12 LAB — COMPREHENSIVE METABOLIC PANEL
ALBUMIN: 4.2 g/dL (ref 3.5–5.0)
ALT: 9 U/L — AB (ref 14–54)
AST: 16 U/L (ref 15–41)
Alkaline Phosphatase: 35 U/L — ABNORMAL LOW (ref 38–126)
Anion gap: 11 (ref 5–15)
BUN: 6 mg/dL (ref 6–20)
CHLORIDE: 103 mmol/L (ref 101–111)
CO2: 22 mmol/L (ref 22–32)
CREATININE: 0.81 mg/dL (ref 0.44–1.00)
Calcium: 8.8 mg/dL — ABNORMAL LOW (ref 8.9–10.3)
GFR calc non Af Amer: >60 mL/min (ref >60)
GLUCOSE: 115 mg/dL — AB (ref 65–99)
Potassium: 3.1 mmol/L — ABNORMAL LOW (ref 3.5–5.1)
SODIUM: 136 mmol/L (ref 135–145)
Total Bilirubin: 1.3 mg/dL — ABNORMAL HIGH (ref 0.3–1.2)
Total Protein: 6.8 g/dL (ref 6.5–8.1)

## 2016-11-12 LAB — CBC
HEMATOCRIT: 38.4 % (ref 36.0–46.0)
HEMOGLOBIN: 13.4 g/dL (ref 12.0–15.0)
MCH: 30.2 pg (ref 26.0–34.0)
MCHC: 34.9 g/dL (ref 30.0–36.0)
MCV: 86.5 fL (ref 78.0–100.0)
Platelets: 197 10*3/uL (ref 150–400)
RBC: 4.44 MIL/uL (ref 3.87–5.11)
RDW: 11.9 % (ref 11.5–15.5)
WBC: 11.2 10*3/uL — ABNORMAL HIGH (ref 4.0–10.5)

## 2016-11-12 LAB — URINALYSIS, ROUTINE W REFLEX MICROSCOPIC
BILIRUBIN URINE: NEGATIVE
Glucose, UA: NEGATIVE mg/dL
Ketones, ur: 5 mg/dL — AB
LEUKOCYTES UA: NEGATIVE
NITRITE: NEGATIVE
PROTEIN: NEGATIVE mg/dL
Specific Gravity, Urine: 1.009 (ref 1.005–1.030)
pH: 7 (ref 5.0–8.0)

## 2016-11-12 LAB — I-STAT BETA HCG BLOOD, ED (MC, WL, AP ONLY)

## 2016-11-12 LAB — LIPASE, BLOOD: LIPASE: 27 U/L (ref 11–51)

## 2016-11-12 MED ORDER — PROMETHAZINE HCL 25 MG RE SUPP: 25.0000 mg | Freq: Four times a day (QID) | RECTAL | 0 refills | Status: DC | PRN

## 2016-11-12 MED ORDER — POTASSIUM CHLORIDE CRYS ER 20 MEQ PO TBCR
40.0000 meq | EXTENDED_RELEASE_TABLET | Freq: Once | ORAL | Status: AC
  Administered 2016-11-12: 40 meq via ORAL
  Filled 2016-11-12: qty 2

## 2016-11-12 MED ORDER — SODIUM CHLORIDE 0.9 % IV BOLUS (SEPSIS)
1000.0000 mL | Freq: Once | INTRAVENOUS | Status: AC
  Administered 2016-11-12: 1000 mL via INTRAVENOUS

## 2016-11-12 MED ORDER — ONDANSETRON 4 MG PO TBDP
4.0000 mg | ORAL_TABLET | Freq: Once | ORAL | Status: AC
  Administered 2016-11-12: 4 mg via ORAL

## 2016-11-12 MED ORDER — PROCHLORPERAZINE EDISYLATE 5 MG/ML IJ SOLN
10.0000 mg | Freq: Once | INTRAMUSCULAR | Status: AC
  Administered 2016-11-12: 10 mg via INTRAVENOUS
  Filled 2016-11-12: qty 2

## 2016-11-12 MED ORDER — KETOROLAC TROMETHAMINE 30 MG/ML IJ SOLN
30.0000 mg | Freq: Once | INTRAMUSCULAR | Status: AC
  Administered 2016-11-12: 30 mg via INTRAVENOUS
  Filled 2016-11-12: qty 1

## 2016-11-12 MED ORDER — ONDANSETRON 4 MG PO TBDP
ORAL_TABLET | ORAL | Status: AC
  Filled 2016-11-12: qty 1

## 2016-11-12 MED FILL — PHENADOZ 25 MG SUPP: 25 | 3 days supply | Qty: 12 | Fill #0

## 2016-11-12 NOTE — ED Notes (Signed)
Pt tolerated PO fluids

## 2016-11-12 NOTE — ED Triage Notes (Signed)
Pt brought to ED by [facility] from home for c/o 8/10 RLQ abd pain nausea, vomiting and diarrhea for the past 3 days. Denies fever or chills.

## 2016-11-12 NOTE — ED Provider Notes (Signed)
[facility]-EMERGENCY DEPT Provider Note   CSN: [id number] Arrival date & time: 11/12/16  7829     History   Chief Complaint Chief Complaint  Patient presents with  . Abdominal Pain  . Nausea  . Emesis  . Diarrhea    HPI Leslie Yates is a 26 y.o. female.  The history is provided by the patient and medical records.  Abdominal Pain   Associated symptoms include diarrhea and vomiting.  Emesis   Associated symptoms include abdominal pain and diarrhea.  Diarrhea   Associated symptoms include abdominal pain and vomiting.    26 y.o. F with hx of asthma, obesity, ovarian cysts, presenting to the ED for abdominal pain, nausea, vomiting, diarrhea.  States this has been ongoing since Thursday.  States she was seen here yesterday but does not feel any better.  States she has been having recurrent nausea and vomiting. She did have some diarrhea this morning around 6:30 AM. States her stomach feels like it is "tied in knots". States she does feel quite a bit of cramping throughout her entire abdomen. She's not had any fever or chills. No sick contacts. She denies any marijuana use since last admission. States she's been taking the Compazine which was prescribed to her, however when she swallows that she vomits it back up. She feels this is happening as she has no food on her stomach.    Past Medical History:  Diagnosis Date  . Asthma   . Obesity   . Ovarian cyst     Patient Active Problem List   Diagnosis Date Noted  . Ovarian cyst, right 11/10/2016  . Nausea with vomiting 11/09/2016  . Cannabinoid hyperemesis syndrome (HCC) 11/09/2016    Past Surgical History:  Procedure Laterality Date  . NO PAST SURGERIES      OB History    Gravida Para Term Preterm AB Living   0 0 0 0 0 0   SAB TAB Ectopic Multiple Live Births   0 0 0 0 0       Home Medications    Prior to Admission medications   Medication Sig Start Date End Date Taking? Authorizing Provider  ibuprofen (ADVIL,MOTRIN)  600 MG tablet Take 1 tablet (600 mg total) by mouth every 6 (six) hours as needed for moderate pain or cramping. 11/10/16  Yes Carolynn Comment, MD  LORazepam (ATIVAN) 1 MG tablet Take 1 tablet (1 mg total) by mouth 3 (three) times daily as needed for anxiety. 11/11/16  Yes Gerhard Munch, MD    Family History Family History  Problem Relation Age of Onset  . Diabetes Mother   . Hypertension Mother   . Diabetes Father   . Hypertension Father     Social History Social History  Substance Use Topics  . Smoking status: Current Some Day Smoker    Packs/day: 1.00    Years: 11.00    Types: Cigarettes  . Smokeless tobacco: Current User  . Alcohol use 0.0 oz/week     Comment: occasional     Allergies   Penicillins   Review of Systems Review of Systems  Gastrointestinal: Positive for abdominal pain, diarrhea and vomiting.  All other systems reviewed and are negative.    Physical Exam Updated Vital Signs BP (!) 149/87   Pulse (!) 50   Temp 98.7 F (37.1 C) (Oral)   Resp 13   Ht 6\' 1"  (1.854 m)   Wt 258 lb (117 kg)   LMP 10/23/2016   SpO2 100%  BMI 34.04 kg/m   Physical Exam  Constitutional: She is oriented to person, place, and time. She appears well-developed and well-nourished.  Obese, NAD, resting comfortable upon entering room  HENT:  Head: Normocephalic and atraumatic.  Mouth/Throat: Oropharynx is clear and moist.  Eyes: Conjunctivae and EOM are normal. Pupils are equal, round, and reactive to light.  Neck: Normal range of motion.  Cardiovascular: Normal rate, regular rhythm and normal heart sounds.   Pulmonary/Chest: Effort normal and breath sounds normal.  Abdominal: Soft. Bowel sounds are normal. There is no tenderness. There is no rigidity and no guarding.  Musculoskeletal: Normal range of motion.  Neurological: She is alert and oriented to person, place, and time.  Skin: Skin is warm and dry.  Psychiatric: She has a normal mood and affect.  Nursing note  and vitals reviewed.    ED Treatments / Results  Labs (all labs ordered are listed, but only abnormal results are displayed) Labs Reviewed  COMPREHENSIVE METABOLIC PANEL - Abnormal; Notable for the following:       Result Value   Potassium 3.1 (*)    Glucose, Bld 115 (*)    Calcium 8.8 (*)    ALT 9 (*)    Alkaline Phosphatase 35 (*)    Total Bilirubin 1.3 (*)    All other components within normal limits  CBC - Abnormal; Notable for the following:    WBC 11.2 (*)    All other components within normal limits  URINALYSIS, ROUTINE W REFLEX MICROSCOPIC - Abnormal; Notable for the following:    Hgb urine dipstick SMALL (*)    Ketones, ur 5 (*)    Bacteria, UA RARE (*)    Squamous Epithelial / LPF 0-5 (*)    All other components within normal limits  LIPASE, BLOOD  I-STAT BETA HCG BLOOD, ED ([facility], [facility], [facility] ONLY)    EKG  EKG Interpretation None       Radiology No results found.  Procedures Procedures (including critical care time)  Medications Ordered in ED Medications  ondansetron (ZOFRAN-ODT) 4 MG disintegrating tablet (not administered)  sodium chloride 0.9 % bolus 1,000 mL (not administered)  prochlorperazine (COMPAZINE) injection 10 mg (not administered)  ketorolac (TORADOL) 30 MG/ML injection 30 mg (not administered)  ondansetron (ZOFRAN-ODT) disintegrating tablet 4 mg (4 mg Oral Given 11/12/16 0704)     Initial Impression / Assessment and Plan / ED Course  I have reviewed the triage vital signs and the nursing notes.  Pertinent labs & imaging results that were available during my care of the patient were reviewed by me and considered in my medical decision making (see chart for details).  26 year old female here with nausea and vomiting. Reports some loose stools this morning as well. She is afebrile and nontoxic. Vitals are stable. Abdomen is soft and benign. Patient has been seen multiple times recently and has even undergone admission for intractable nausea and  vomiting. She denies any recent marijuana use. Screening lab work is overall reassuring aside from a mild hypokalemia which may be secondary to her vomiting. This was orally replaced here. After treatment with IV fluids, Toradol, and Compazine patient is feeling much better. She was able to tolerate ginger ale and oral medications without issue. Abdomen remained soft and benign. Vitals remained stable. Feel she is stable for discharge home. I prescribed Phenergan suppositories should she be unable to tolerate her oral medications. I recommended that she follow-up closely with the outpatient clinic for ongoing care.  Discussed plan with  patient, she acknowledged understanding and agreed with plan of care.  Return precautions given for new or worsening symptoms.  Final Clinical Impressions(s) / ED Diagnoses   Final diagnoses:  Nausea and vomiting, intractability of vomiting not specified, unspecified vomiting type  Hypokalemia    New Prescriptions Discharge Medication List as of 11/12/2016 10:29 AM    START taking these medications   Details  promethazine (PHENERGAN) 25 MG suppository Place 1 suppository (25 mg total) rectally every 6 (six) hours as needed for nausea or vomiting., Starting Mon 11/12/2016, Print         Garlon HatchetSanders, Ameria Sanjurjo M, PA-C 11/12/16 1108    Gwyneth SproutPlunkett, Whitney, MD 11/12/16 1232

## 2016-11-12 NOTE — Discharge Instructions (Signed)
Take the prescribed medication as directed. Follow-up with the outpatient clinic. Return to the ED for new or worsening symptoms.

## 2016-11-13 ENCOUNTER — Encounter (HOSPITAL_COMMUNITY): Payer: Self-pay

## 2016-11-13 ENCOUNTER — Emergency Department (HOSPITAL_COMMUNITY)
Admission: EM | Admit: 2016-11-13 | Discharge: 2016-11-13 | Discharge status: Against Medical Advice | Payer: Self-pay | Attending: Emergency Medicine | Admitting: Emergency Medicine

## 2016-11-13 DIAGNOSIS — Z79899 Other long term (current) drug therapy: Secondary | ICD-10-CM | POA: Insufficient documentation

## 2016-11-13 DIAGNOSIS — R1115 Cyclical vomiting syndrome unrelated to migraine: Secondary | ICD-10-CM

## 2016-11-13 DIAGNOSIS — F17210 Nicotine dependence, cigarettes, uncomplicated: Secondary | ICD-10-CM | POA: Insufficient documentation

## 2016-11-13 DIAGNOSIS — G43A0 Cyclical vomiting, not intractable: Secondary | ICD-10-CM | POA: Insufficient documentation

## 2016-11-13 DIAGNOSIS — J45909 Unspecified asthma, uncomplicated: Secondary | ICD-10-CM | POA: Insufficient documentation

## 2016-11-13 LAB — COMPREHENSIVE METABOLIC PANEL
ALBUMIN: 4.3 g/dL (ref 3.5–5.0)
ALT: 9 U/L — AB (ref 14–54)
AST: 12 U/L — AB (ref 15–41)
Alkaline Phosphatase: 34 U/L — ABNORMAL LOW (ref 38–126)
Anion gap: 8 (ref 5–15)
BUN: 5 mg/dL — AB (ref 6–20)
CHLORIDE: 103 mmol/L (ref 101–111)
CO2: 24 mmol/L (ref 22–32)
CREATININE: 0.69 mg/dL (ref 0.44–1.00)
Calcium: 8.8 mg/dL — ABNORMAL LOW (ref 8.9–10.3)
GFR calc Af Amer: >60 mL/min (ref >60)
GFR calc non Af Amer: >60 mL/min (ref >60)
GLUCOSE: 109 mg/dL — AB (ref 65–99)
POTASSIUM: 3.2 mmol/L — AB (ref 3.5–5.1)
Sodium: 135 mmol/L (ref 135–145)
Total Bilirubin: 1 mg/dL (ref 0.3–1.2)
Total Protein: 7.2 g/dL (ref 6.5–8.1)

## 2016-11-13 LAB — CBC WITH DIFFERENTIAL/PLATELET
BASOS ABS: 0 10*3/uL (ref 0.0–0.1)
BASOS PCT: 0 %
Eosinophils Absolute: 0 10*3/uL (ref 0.0–0.7)
Eosinophils Relative: 0 %
HEMATOCRIT: 37.5 % (ref 36.0–46.0)
Hemoglobin: 13.5 g/dL (ref 12.0–15.0)
LYMPHS PCT: 14 %
Lymphs Abs: 1.4 10*3/uL (ref 0.7–4.0)
MCH: 30.6 pg (ref 26.0–34.0)
MCHC: 36 g/dL (ref 30.0–36.0)
MCV: 85 fL (ref 78.0–100.0)
MONO ABS: 0.6 10*3/uL (ref 0.1–1.0)
Monocytes Relative: 6 %
NEUTROS ABS: 8 10*3/uL — AB (ref 1.7–7.7)
Neutrophils Relative %: 80 %
PLATELETS: 194 10*3/uL (ref 150–400)
RBC: 4.41 MIL/uL (ref 3.87–5.11)
RDW: 11.8 % (ref 11.5–15.5)
WBC: 10 10*3/uL (ref 4.0–10.5)

## 2016-11-13 MED ORDER — PANTOPRAZOLE SODIUM 40 MG IV SOLR
40.0000 mg | Freq: Once | INTRAVENOUS | Status: AC
  Administered 2016-11-13: 40 mg via INTRAVENOUS
  Filled 2016-11-13: qty 40

## 2016-11-13 MED ORDER — KETOROLAC TROMETHAMINE 30 MG/ML IJ SOLN
30.0000 mg | Freq: Once | INTRAMUSCULAR | Status: AC
  Administered 2016-11-13: 30 mg via INTRAVENOUS
  Filled 2016-11-13: qty 1

## 2016-11-13 MED ORDER — PROCHLORPERAZINE EDISYLATE 5 MG/ML IJ SOLN
10.0000 mg | Freq: Once | INTRAMUSCULAR | Status: AC
  Administered 2016-11-13: 10 mg via INTRAVENOUS
  Filled 2016-11-13: qty 2

## 2016-11-13 MED ORDER — POTASSIUM CHLORIDE CRYS ER 20 MEQ PO TBCR
40.0000 meq | EXTENDED_RELEASE_TABLET | Freq: Once | ORAL | Status: AC
  Administered 2016-11-13: 40 meq via ORAL
  Filled 2016-11-13: qty 2

## 2016-11-13 MED ORDER — SODIUM CHLORIDE 0.9 % IV BOLUS (SEPSIS)
1000.0000 mL | Freq: Once | INTRAVENOUS | Status: AC
  Administered 2016-11-13: 1000 mL via INTRAVENOUS

## 2016-11-13 NOTE — ED Provider Notes (Signed)
[facility]-EMERGENCY DEPT Provider Note   CSN: [id number] Arrival date & time: 11/13/16  0736     History   Chief Complaint Chief Complaint  Patient presents with  . Emesis    HPI Leslie Yates is a 26 y.o. female.  Patient presents with vomiting. She has been admitted before for intractable vomiting.   The history is provided by the patient. No language interpreter was used.  Emesis   This is a recurrent problem. The current episode started yesterday. The problem occurs 2 to 4 times per day. The problem has not changed since onset.The emesis has an appearance of stomach contents. There has been no fever. Pertinent negatives include no abdominal pain, no chills, no cough, no diarrhea and no headaches.    Past Medical History:  Diagnosis Date  . Asthma   . Obesity   . Ovarian cyst     Patient Active Problem List   Diagnosis Date Noted  . Ovarian cyst, right 11/10/2016  . Nausea with vomiting 11/09/2016  . Cannabinoid hyperemesis syndrome (HCC) 11/09/2016    Past Surgical History:  Procedure Laterality Date  . NO PAST SURGERIES      OB History    Gravida Para Term Preterm AB Living   0 0 0 0 0 0   SAB TAB Ectopic Multiple Live Births   0 0 0 0 0       Home Medications    Prior to Admission medications   Medication Sig Start Date End Date Taking? Authorizing Provider  ibuprofen (ADVIL,MOTRIN) 600 MG tablet Take 1 tablet (600 mg total) by mouth every 6 (six) hours as needed for moderate pain or cramping. 11/10/16  Yes Carolynn Comment, MD  LORazepam (ATIVAN) 1 MG tablet Take 1 tablet (1 mg total) by mouth 3 (three) times daily as needed for anxiety. 11/11/16  Yes Gerhard Munch, MD  prochlorperazine (COMPAZINE) 10 MG tablet Take 10 mg by mouth every 6 (six) hours as needed for nausea or vomiting.   Yes [provider]  promethazine (PHENERGAN) 25 MG suppository Place 1 suppository (25 mg total) rectally every 6 (six) hours as needed for nausea or vomiting.  11/12/16   Garlon Hatchet, PA-C    Family History Family History  Problem Relation Age of Onset  . Diabetes Mother   . Hypertension Mother   . Diabetes Father   . Hypertension Father     Social History Social History  Substance Use Topics  . Smoking status: Current Some Day Smoker    Packs/day: 1.00    Years: 11.00    Types: Cigarettes  . Smokeless tobacco: Current User  . Alcohol use 0.0 oz/week     Comment: occasional     Allergies   Penicillins   Review of Systems Review of Systems  Constitutional: Negative for appetite change, chills and fatigue.  HENT: Negative for congestion, ear discharge and sinus pressure.   Eyes: Negative for discharge.  Respiratory: Negative for cough.   Cardiovascular: Negative for chest pain.  Gastrointestinal: Positive for vomiting. Negative for abdominal pain and diarrhea.  Genitourinary: Negative for frequency and hematuria.  Musculoskeletal: Negative for back pain.  Skin: Negative for rash.  Neurological: Negative for seizures and headaches.  Psychiatric/Behavioral: Negative for hallucinations.     Physical Exam Updated Vital Signs BP (!) 155/93 (BP Location: Left Arm)   Pulse (!) 51   Temp 98.7 F (37.1 C) (Oral)   Resp 16   Ht 6\' 1"  (1.854 m)  Wt 117.5 kg (259 lb)   LMP 11/12/2016   SpO2 100%   BMI 34.17 kg/m   Physical Exam  Constitutional: She is oriented to person, place, and time. She appears well-developed.  HENT:  Head: Normocephalic.  Eyes: Conjunctivae and EOM are normal. No scleral icterus.  Neck: Neck supple. No thyromegaly present.  Cardiovascular: Normal rate and regular rhythm.  Exam reveals no gallop and no friction rub.   No murmur heard. Pulmonary/Chest: No stridor. She has no wheezes. She has no rales. She exhibits no tenderness.  Abdominal: She exhibits no distension. There is no tenderness. There is no rebound.  Musculoskeletal: Normal range of motion. She exhibits no edema.  Lymphadenopathy:     She has no cervical adenopathy.  Neurological: She is oriented to person, place, and time. She exhibits normal muscle tone. Coordination normal.  Skin: No rash noted. No erythema.  Psychiatric: She has a normal mood and affect. Her behavior is normal.     ED Treatments / Results  Labs (all labs ordered are listed, but only abnormal results are displayed) Labs Reviewed  CBC WITH DIFFERENTIAL/PLATELET - Abnormal; Notable for the following:       Result Value   Neutro Abs 8.0 (*)    All other components within normal limits  COMPREHENSIVE METABOLIC PANEL - Abnormal; Notable for the following:    Potassium 3.2 (*)    Glucose, Bld 109 (*)    BUN 5 (*)    Calcium 8.8 (*)    AST 12 (*)    ALT 9 (*)    Alkaline Phosphatase 34 (*)    All other components within normal limits    EKG  EKG Interpretation None       Radiology No results found.  Procedures Procedures (including critical care time)  Medications Ordered in ED Medications  sodium chloride 0.9 % bolus 1,000 mL (0 mLs Intravenous Stopped 11/13/16 1014)  prochlorperazine (COMPAZINE) injection 10 mg (10 mg Intravenous Given 11/13/16 0859)  ketorolac (TORADOL) 30 MG/ML injection 30 mg (30 mg Intravenous Given 11/13/16 0900)  pantoprazole (PROTONIX) injection 40 mg (40 mg Intravenous Given 11/13/16 0902)  potassium chloride SA (K-DUR,KLOR-CON) CR tablet 40 mEq (40 mEq Oral Given 11/13/16 1014)     Initial Impression / Assessment and Plan / ED Course  I have reviewed the triage vital signs and the nursing notes.  Pertinent labs & imaging results that were available during my care of the patient were reviewed by me and considered in my medical decision making (see chart for details).     The patient left AMA before I was able to discuss her lab results and plan with her  Final Clinical Impressions(s) / ED Diagnoses   Final diagnoses:  None    New Prescriptions Discharge Medication List as of 11/13/2016 10:54 AM         Bethann BerkshireZammit, Marilee Ditommaso, MD 11/13/16 1203

## 2016-11-13 NOTE — ED Notes (Signed)
States her ride is here and she has to go because she has not other ride home.  She was informed that she wasn't up for d/c yet but she said she didn't care and that she needs to go. Pt pulled out her own IV and walked out of the department.

## 2016-11-13 NOTE — ED Notes (Addendum)
Pt from home lives w/girlfriend.   C/O n/v x 5 days.  EMS VS: SR w/ inverted T-waves, 160/96, 52, 18, 100%RA.  CBG 120.  [facility] and given RX for antiemetics but hasn't been able to get them filled.   States was given RX yesterday but couldn't afford it.  States she called [facility] yesterday and was told to call back today at 0830 to get an appointment for Thursday for an appointment to be seen.  She was also told she can get her RX filled there today at 1530 but states she can't keep anything down and couldn't wait until then.

## 2016-11-13 NOTE — ED Notes (Signed)
Bed: WA05 Expected date:  Expected time:  Means of arrival:  Comments: ems 

## 2016-11-14 ENCOUNTER — Emergency Department (HOSPITAL_COMMUNITY)
Admission: EM | Admit: 2016-11-14 | Discharge: 2016-11-14 | Disposition: A | Discharge status: Home / Self Care | Payer: Self-pay | Attending: Emergency Medicine | Admitting: Emergency Medicine

## 2016-11-14 ENCOUNTER — Encounter (HOSPITAL_COMMUNITY): Payer: Self-pay | Admitting: Family Medicine

## 2016-11-14 ENCOUNTER — Encounter (HOSPITAL_COMMUNITY): Payer: Self-pay | Admitting: Emergency Medicine

## 2016-11-14 ENCOUNTER — Emergency Department (HOSPITAL_COMMUNITY)
Admission: EM | Admit: 2016-11-14 | Discharge: 2016-11-14 | Discharge status: Against Medical Advice | Payer: Self-pay | Attending: Dermatology | Admitting: Dermatology

## 2016-11-14 DIAGNOSIS — R197 Diarrhea, unspecified: Secondary | ICD-10-CM | POA: Insufficient documentation

## 2016-11-14 DIAGNOSIS — R112 Nausea with vomiting, unspecified: Secondary | ICD-10-CM | POA: Insufficient documentation

## 2016-11-14 DIAGNOSIS — E876 Hypokalemia: Secondary | ICD-10-CM | POA: Insufficient documentation

## 2016-11-14 DIAGNOSIS — F17210 Nicotine dependence, cigarettes, uncomplicated: Secondary | ICD-10-CM | POA: Insufficient documentation

## 2016-11-14 DIAGNOSIS — J45909 Unspecified asthma, uncomplicated: Secondary | ICD-10-CM | POA: Insufficient documentation

## 2016-11-14 DIAGNOSIS — Z79899 Other long term (current) drug therapy: Secondary | ICD-10-CM | POA: Insufficient documentation

## 2016-11-14 LAB — CBC
HCT: 38.4 % (ref 36.0–46.0)
HCT: 39.1 % (ref 36.0–46.0)
Hemoglobin: 13.9 g/dL (ref 12.0–15.0)
Hemoglobin: 14.3 g/dL (ref 12.0–15.0)
MCH: 30.8 pg (ref 26.0–34.0)
MCH: 30.8 pg (ref 26.0–34.0)
MCHC: 36.2 g/dL — ABNORMAL HIGH (ref 30.0–36.0)
MCHC: 36.6 g/dL — AB (ref 30.0–36.0)
MCV: 85 fL (ref 78.0–100.0)
MCV: 84.1 fL (ref 78.0–100.0)
PLATELETS: 202 10*3/uL (ref 150–400)
Platelets: 219 10*3/uL (ref 150–400)
RBC: 4.52 MIL/uL (ref 3.87–5.11)
RBC: 4.65 MIL/uL (ref 3.87–5.11)
RDW: 11.8 % (ref 11.5–15.5)
RDW: 12.1 % (ref 11.5–15.5)
WBC: 9.1 10*3/uL (ref 4.0–10.5)
WBC: 11 10*3/uL — ABNORMAL HIGH (ref 4.0–10.5)

## 2016-11-14 LAB — COMPREHENSIVE METABOLIC PANEL
ALBUMIN: 4.3 g/dL (ref 3.5–5.0)
ALK PHOS: 36 U/L — AB (ref 38–126)
ALK PHOS: 37 U/L — AB (ref 38–126)
ALT: 12 U/L — AB (ref 14–54)
ALT: 11 U/L — ABNORMAL LOW (ref 14–54)
ANION GAP: 11 (ref 5–15)
AST: 16 U/L (ref 15–41)
AST: 13 U/L — ABNORMAL LOW (ref 15–41)
Albumin: 4.1 g/dL (ref 3.5–5.0)
Anion gap: 8 (ref 5–15)
BILIRUBIN TOTAL: 1.2 mg/dL (ref 0.3–1.2)
BUN: <5 mg/dL — ABNORMAL LOW (ref 6–20)
BUN: 8 mg/dL (ref 6–20)
CALCIUM: 9.1 mg/dL (ref 8.9–10.3)
CALCIUM: 9.2 mg/dL (ref 8.9–10.3)
CO2: 24 mmol/L (ref 22–32)
CO2: 25 mmol/L (ref 22–32)
CREATININE: 0.77 mg/dL (ref 0.44–1.00)
Chloride: 103 mmol/L (ref 101–111)
Chloride: 101 mmol/L (ref 101–111)
Creatinine, Ser: 0.89 mg/dL (ref 0.44–1.00)
GFR calc Af Amer: >60 mL/min (ref >60)
GFR calc non Af Amer: >60 mL/min (ref >60)
GFR calc non Af Amer: >60 mL/min (ref >60)
GLUCOSE: 104 mg/dL — AB (ref 65–99)
Glucose, Bld: 100 mg/dL — ABNORMAL HIGH (ref 65–99)
Potassium: 3.1 mmol/L — ABNORMAL LOW (ref 3.5–5.1)
Potassium: 2.7 mmol/L — CL (ref 3.5–5.1)
Sodium: 135 mmol/L (ref 135–145)
Sodium: 137 mmol/L (ref 135–145)
TOTAL PROTEIN: 7.3 g/dL (ref 6.5–8.1)
Total Bilirubin: 1.5 mg/dL — ABNORMAL HIGH (ref 0.3–1.2)
Total Protein: 6.6 g/dL (ref 6.5–8.1)

## 2016-11-14 LAB — I-STAT BETA HCG BLOOD, ED (MC, WL, AP ONLY)

## 2016-11-14 LAB — LIPASE, BLOOD: Lipase: 18 U/L (ref 11–51)

## 2016-11-14 MED ORDER — ONDANSETRON 4 MG PO TBDP
4.0000 mg | ORAL_TABLET | Freq: Once | ORAL | Status: AC | PRN
  Administered 2016-11-14: 4 mg via ORAL

## 2016-11-14 MED ORDER — POTASSIUM CHLORIDE 10 MEQ/100ML IV SOLN
10.0000 meq | Freq: Once | INTRAVENOUS | Status: AC
  Administered 2016-11-14: 10 meq via INTRAVENOUS
  Filled 2016-11-14: qty 100

## 2016-11-14 MED ORDER — SODIUM CHLORIDE 0.9 % IV BOLUS (SEPSIS)
1000.0000 mL | Freq: Once | INTRAVENOUS | Status: AC
  Administered 2016-11-14: 1000 mL via INTRAVENOUS

## 2016-11-14 MED ORDER — KETOROLAC TROMETHAMINE 30 MG/ML IJ SOLN
30.0000 mg | Freq: Once | INTRAMUSCULAR | Status: AC
  Administered 2016-11-14: 30 mg via INTRAVENOUS
  Filled 2016-11-14: qty 1

## 2016-11-14 MED ORDER — ONDANSETRON 4 MG PO TBDP: 4.0000 mg | ORAL_TABLET | Freq: Three times a day (TID) | ORAL | 0 refills | Status: DC | PRN

## 2016-11-14 MED ORDER — POTASSIUM CHLORIDE CRYS ER 20 MEQ PO TBCR
40.0000 meq | EXTENDED_RELEASE_TABLET | Freq: Once | ORAL | Status: AC
  Administered 2016-11-14: 40 meq via ORAL
  Filled 2016-11-14: qty 2

## 2016-11-14 MED ORDER — ONDANSETRON 4 MG PO TBDP
ORAL_TABLET | ORAL | Status: AC
  Filled 2016-11-14: qty 1

## 2016-11-14 MED ORDER — METOCLOPRAMIDE HCL 5 MG/ML IJ SOLN
10.0000 mg | Freq: Once | INTRAMUSCULAR | Status: AC
  Administered 2016-11-14: 10 mg via INTRAVENOUS
  Filled 2016-11-14: qty 2

## 2016-11-14 MED ORDER — POTASSIUM CHLORIDE CRYS ER 20 MEQ PO TBCR: 20.0000 meq | EXTENDED_RELEASE_TABLET | Freq: Every day | ORAL | 0 refills | Status: DC

## 2016-11-14 NOTE — ED Notes (Signed)
Bed: WA12 Expected date:  Expected time:  Means of arrival:  Comments: EMS25 yo female lower abdominal pain/nauseated-symptoms since last Thursday

## 2016-11-14 NOTE — ED Provider Notes (Signed)
[facility]-EMERGENCY DEPT Provider Note   CSN: [id number] Arrival date & time: 11/14/16  2009     History   Chief Complaint Chief Complaint  Patient presents with  . Abdominal Pain    HPI Leslie Yates is a 26 y.o. female.  HPI  26 y.o. female with a hx of Obesity, Ovarian Cyst, presents to the Emergency Department today due to generalized abdominal discomfort with associated N/V/D. Notes hx same. Seen in ED on 11-13-16, 11-12-16, 11-11-16, 11-10-16, and 11-09-16 with admission and DC for same. During admission, she was given GYN referral. Noted hx in 2017 where they opted to resect ovarian cyst, but pt declined due to financial reasons. No new symptoms today. Notes pain has been occurring x 1 week. Noted onset menses x 2 days ago and usually lasts a week. No fevers. No CP/SOB. No vaginal discharge. Rates pain 10/10. Cramping sensation. Notably in RLQ. No other symptoms noted. Of note, pt has appointment with [facility] tomorrow at 8am.    Past Medical History:  Diagnosis Date  . Asthma   . Obesity   . Ovarian cyst     Patient Active Problem List   Diagnosis Date Noted  . Ovarian cyst, right 11/10/2016  . Nausea with vomiting 11/09/2016  . Cannabinoid hyperemesis syndrome (HCC) 11/09/2016    Past Surgical History:  Procedure Laterality Date  . NO PAST SURGERIES      OB History    Gravida Para Term Preterm AB Living   0 0 0 0 0 0   SAB TAB Ectopic Multiple Live Births   0 0 0 0 0       Home Medications    Prior to Admission medications   Medication Sig Start Date End Date Taking? Authorizing Provider  ibuprofen (ADVIL,MOTRIN) 600 MG tablet Take 1 tablet (600 mg total) by mouth every 6 (six) hours as needed for moderate pain or cramping. 11/10/16   Carolynn Comment, MD  LORazepam (ATIVAN) 1 MG tablet Take 1 tablet (1 mg total) by mouth 3 (three) times daily as needed for anxiety. 11/11/16   Gerhard Munch, MD  prochlorperazine (COMPAZINE) 10 MG tablet Take 10 mg  by mouth every 6 (six) hours as needed for nausea or vomiting.    [provider]  promethazine (PHENERGAN) 25 MG suppository Place 1 suppository (25 mg total) rectally every 6 (six) hours as needed for nausea or vomiting. 11/12/16   Garlon Hatchet, PA-C    Family History Family History  Problem Relation Age of Onset  . Diabetes Mother   . Hypertension Mother   . Diabetes Father   . Hypertension Father     Social History Social History  Substance Use Topics  . Smoking status: Current Every Day Smoker    Packs/day: 1.00    Years: 11.00    Types: Cigarettes  . Smokeless tobacco: Current User  . Alcohol use 0.0 oz/week     Comment: Once a month      Allergies   Penicillins   Review of Systems Review of Systems ROS reviewed and all are negative for acute change except as noted in the HPI.  Physical Exam Updated Vital Signs BP (!) 173/76 (BP Location: Right Arm)   Pulse 77   Temp 98.8 F (37.1 C) (Oral)   Resp 18   Ht 6\' 1"  (1.854 m)   Wt 117.5 kg (259 lb)   LMP 11/12/2016   SpO2 100%   BMI 34.17 kg/m  Physical Exam  Constitutional: She is oriented to person, place, and time. Vital signs are normal. She appears well-developed and well-nourished.  NAD. No active emesis  HENT:  Head: Normocephalic and atraumatic.  Right Ear: Hearing normal.  Left Ear: Hearing normal.  Eyes: Conjunctivae and EOM are normal. Pupils are equal, round, and reactive to light.  Neck: Normal range of motion. Neck supple.  Cardiovascular: Normal rate, regular rhythm, normal heart sounds and intact distal pulses.   Pulmonary/Chest: Effort normal and breath sounds normal.  Abdominal: Soft. There is no tenderness. There is no rigidity, no rebound, no guarding, no CVA tenderness, no tenderness at McBurney's point and negative Murphy's sign.  Musculoskeletal: Normal range of motion.  Neurological: She is alert and oriented to person, place, and time.  Skin: Skin is warm and dry.    Psychiatric: She has a normal mood and affect. Her speech is normal and behavior is normal. Thought content normal.  Nursing note and vitals reviewed.  ED Treatments / Results  Labs (all labs ordered are listed, but only abnormal results are displayed) Labs Reviewed  CBC - Abnormal; Notable for the following:       Result Value   WBC 11.0 (*)    MCHC 36.6 (*)    All other components within normal limits  COMPREHENSIVE METABOLIC PANEL - Abnormal; Notable for the following:    Potassium 2.7 (*)    Glucose, Bld 104 (*)    AST 13 (*)    ALT 11 (*)    Alkaline Phosphatase 37 (*)    All other components within normal limits    EKG  EKG Interpretation  Date/Time:  Wednesday Nov 14 2016 22:22:45 EDT Ventricular Rate:  52 PR Interval:    QRS Duration: 85 QT Interval:  483 QTC Calculation: 450 R Axis:   60 Text Interpretation:  Sinus rhythm Low voltage, precordial leads When compared to prior, no significant chcanges seen.  No STEMI Confirmed by Theda Belfastegeler, Chris ([id number]) on 11/14/2016 10:47:26 PM       Radiology No results found.  Procedures Procedures (including critical care time)  Medications Ordered in ED Medications  potassium chloride 10 mEq in 100 mL IVPB (10 mEq Intravenous New Bag/Given 11/14/16 2216)  sodium chloride 0.9 % bolus 1,000 mL (0 mLs Intravenous Stopped 11/14/16 2216)  ketorolac (TORADOL) 30 MG/ML injection 30 mg (30 mg Intravenous Given 11/14/16 2108)  metoCLOPramide (REGLAN) injection 10 mg (10 mg Intravenous Given 11/14/16 2107)  potassium chloride SA (K-DUR,KLOR-CON) CR tablet 40 mEq (40 mEq Oral Given 11/14/16 2217)   Initial Impression / Assessment and Plan / ED Course  I have reviewed the triage vital signs and the nursing notes.  Pertinent labs & imaging results that were available during my care of the patient were reviewed by me and considered in my medical decision making (see chart for details).  Final Clinical Impressions(s) / ED Diagnoses  {I  have reviewed and evaluated the relevant laboratory values.   {I have reviewed the relevant previous healthcare records.  {I obtained HPI from historian.   ED Course:  Assessment: Patient is a 26 y.o. female presents with abdominal pain x 1 week. Seen in ED multiple times for same. Recent admission at start on 11-09-16 shows likely diagnosis due to ovarian cyst. Hx same. Opted for resection due to symptoms, but financial constraints in place. States feels the same as before. No fevers. Pt menses began x 2 days ago. Usually lasts x 1 week.. On exam, nontoxic,  nonseptic appearing, in no apparent distress. Patient's pain and other symptoms adequately managed in emergency department.  Fluid bolus given.  Labs and vitals reviewed.  Patient does not meet the SIRS or Sepsis criteria.  On repeat exam patient does not have a surgical abdomen and there are no peritoneal signs.  No indication of appendicitis, bowel obstruction, bowel perforation, cholecystitis, diverticulitis, PID or ectopic pregnancy. Pt has follow up with PCP tomorrow AM. Likely symptoms related to ovarian cyst as previous visits suggest. Patient discharged home with symptomatic treatment and given strict instructions for follow-up with their primary care physician.  I have also discussed reasons to return immediately to the ER.  Patient expresses understanding and agrees with plan.  9:58 PM- Potassium 2.7. Given IV Potassium and PO Kdur . EKG unremarkable. Will DC home after infusion and PO as Pt requests to leave and see PCP in AM  Disposition/Plan:  DC Home Additional Verbal discharge instructions given and discussed with patient.  Pt Instructed to f/u with PCP in the next week for evaluation and treatment of symptoms. Return precautions given Pt acknowledges and agrees with plan  Supervising Physician Tegeler, Canary Brim, *  Final diagnoses:  Non-intractable vomiting with nausea, unspecified vomiting type  Hypokalemia    New  Prescriptions New Prescriptions   No medications on file     Audry Pili, Cordelia Poche 11/14/16 2247    Tegeler, Canary Brim, MD 11/15/16 [phone]

## 2016-11-14 NOTE — Discharge Instructions (Addendum)
Please keep your appointment tomorrow morning at [facility].  Begin taking the supplemental potassium tomorrow morning. Take this with food to avoid upset stomach.  Please read and follow all provided instructions.  Your diagnoses today include:  1. Non-intractable vomiting with nausea, unspecified vomiting type   2. Hypokalemia     Tests performed today include: Vital signs. See below for your results today.   Medications prescribed:  Take as prescribed   Home care instructions:  Follow any educational materials contained in this packet.  Follow-up instructions: Please follow-up with your primary care provider for further evaluation of symptoms and treatment   Return instructions:  Please return to the Emergency Department if you do not get better, if you get worse, or new symptoms OR  - Fever (temperature greater than 101.58F)  - Bleeding that does not stop with holding pressure to the area    -Severe pain (please note that you may be more sore the day after your accident)  - Chest Pain  - Difficulty breathing  - Severe nausea or vomiting  - Inability to tolerate food and liquids  - Passing out  - Skin becoming red around your wounds  - Change in mental status (confusion or lethargy)  - New numbness or weakness    Please return if you have any other emergent concerns.  Additional Information:  Your vital signs today were: BP (!) 144/89 (BP Location: Left Arm)    Pulse 72    Temp 98.2 F (36.8 C) (Oral)    Resp 20    Ht 6\' 1"  (1.854 m)    Wt 117.5 kg (259 lb)    LMP 11/12/2016    SpO2 99%    BMI 34.17 kg/m  If your blood pressure (BP) was elevated above 135/85 this visit, please have this repeated by your doctor within one month. ---------------

## 2016-11-14 NOTE — ED Triage Notes (Signed)
Pt sts still having N/V and abd pain; pt tearful; pt sts unable to use suppositories

## 2016-11-14 NOTE — ED Triage Notes (Signed)
Patient is from home and [facility]. Patient is experiencing generalized abd pain with nausea, vomiting, and diarrhea. Pt has been seen 6 times within the [facility] since 5/18. [facility], [facility] due to the wait time.

## 2016-11-14 NOTE — ED Notes (Signed)
Date and time results received: 11/14/16 9:52 PM  (use smartphrase ".now" to insert current time)  Test: potassium Critical Value: K 2.7  Name of Provider Notified: Joselyn Glassmanyler, [place]  Orders Received? Or Actions Taken?: EDP notified, no orders at this time

## 2016-11-14 NOTE — ED Provider Notes (Signed)
Leslie Yates is a 26 y.o. female, with a history of Asthma, obesity, and ovarian cyst, presenting to the ED with abdominal pain and vomiting. She has been seen multiple times for this complaint recently.   HPI from Audry Pili, PA-C: "26 y.o. female with a hx of Obesity, Ovarian Cyst, presents to the Emergency Department today due to generalized abdominal discomfort with associated N/V/D. Notes hx same. Seen in ED on 11-13-16, 11-12-16, 11-11-16, 11-10-16, and 11-09-16 with admission and DC for same. During admission, she was given GYN referral. Noted hx in 2017 where they opted to resect ovarian cyst, but pt declined due to financial reasons. No new symptoms today. Notes pain has been occurring x 1 week. Noted onset menses x 2 days ago and usually lasts a week. No fevers. No CP/SOB. No vaginal discharge. Rates pain 10/10. Cramping sensation. Notably in RLQ. No other symptoms noted. Of note, pt has appointment with [facility] tomorrow at 8am. "   Past Medical History:  Diagnosis Date  . Asthma   . Obesity   . Ovarian cyst        Physical Exam  BP (!) 144/89 (BP Location: Left Arm)   Pulse 72   Temp 98.2 F (36.8 C) (Oral)   Resp 20   Ht 6\' 1"  (1.854 m)   Wt 117.5 kg (259 lb)   LMP 11/12/2016   SpO2 99%   BMI 34.17 kg/m   Physical Exam  Constitutional: She appears well-developed and well-nourished. No distress.  HENT:  Head: Normocephalic and atraumatic.  Eyes: Conjunctivae are normal.  Neck: Neck supple.  Cardiovascular: Normal rate, regular rhythm, normal heart sounds and intact distal pulses.   Pulmonary/Chest: Effort normal and breath sounds normal. No respiratory distress.  Abdominal: Soft. There is no tenderness. There is no guarding.  Musculoskeletal: She exhibits no edema.  Lymphadenopathy:    She has no cervical adenopathy.  Neurological: She is alert.  Skin: Skin is warm and dry. She is not diaphoretic.  Psychiatric: She has a normal mood and affect. Her  behavior is normal.  Nursing note and vitals reviewed.   ED Course  Procedures    Results for orders placed or performed during the hospital encounter of 11/14/16  CBC  Result Value Ref Range   WBC 11.0 (H) 4.0 - 10.5 K/uL   RBC 4.65 3.87 - 5.11 MIL/uL   Hemoglobin 14.3 12.0 - 15.0 g/dL   HCT 16.1 09.6 - 04.5 %   MCV 84.1 78.0 - 100.0 fL   MCH 30.8 26.0 - 34.0 pg   MCHC 36.6 (H) 30.0 - 36.0 g/dL   RDW 40.9 81.1 - 91.4 %   Platelets 219 150 - 400 K/uL  Comprehensive metabolic panel  Result Value Ref Range   Sodium 137 135 - 145 mmol/L   Potassium 2.7 (LL) 3.5 - 5.1 mmol/L   Chloride 101 101 - 111 mmol/L   CO2 25 22 - 32 mmol/L   Glucose, Bld 104 (H) 65 - 99 mg/dL   BUN 8 6 - 20 mg/dL   Creatinine, Ser 7.82 0.44 - 1.00 mg/dL   Calcium 9.2 8.9 - 95.6 mg/dL   Total Protein 7.3 6.5 - 8.1 g/dL   Albumin 4.3 3.5 - 5.0 g/dL   AST 13 (L) 15 - 41 U/L   ALT 11 (L) 14 - 54 U/L   Alkaline Phosphatase 37 (L) 38 - 126 U/L   Total Bilirubin 1.2 0.3 - 1.2 mg/dL  GFR calc non Af Amer >60 >60 mL/min   GFR calc Af Amer >60 >60 mL/min   Anion gap 11 5 - 15   [place] Transvaginal Non-ob  Result Date: 11/09/2016 CLINICAL DATA:  Right pelvic pain 6 months. EXAM: TRANSABDOMINAL AND TRANSVAGINAL ULTRASOUND OF PELVIS DOPPLER ULTRASOUND OF OVARIES TECHNIQUE: Both transabdominal and transvaginal ultrasound examinations of the pelvis were performed. Transabdominal technique was performed for global imaging of the pelvis including uterus, ovaries, adnexal regions, and pelvic cul-de-sac. It was necessary to proceed with endovaginal exam following the transabdominal exam to visualize the endometrium and ovaries. Color and duplex Doppler ultrasound was utilized to evaluate blood flow to the ovaries. COMPARISON:  CT 09/27/2016 FINDINGS: Uterus Measurements: 3.2 x 4.8 x 7.6 cm. No fibroids or other mass visualized. Endometrium Thickness: 9 mm.  No focal abnormality visualized. Right ovary Measurements: 6.9 x 10.5  x 13.1 cm. Large complex cystic mass measuring 5.9 x 8.2 x 10.9 cm with predominant cystic component containing uniform low-level internal echoes. No thick septations or obvious mural nodularity. Left ovary Measurements: 2.2 x 2.3 x 3.5 cm. Normal appearance/no adnexal mass. Pulsed Doppler evaluation of both ovaries demonstrates normal low-resistance arterial and venous waveforms. Other findings No abnormal free fluid. IMPRESSION: Mildly complicated right ovarian cystic mass measuring 5.9 x 8.2 x 10.9 cm. This likely represents a non neoplastic process such as a large follicular cyst, endometrioma or hemorrhagic cyst. Recommend follow-up ultrasound 6 weeks. Normal uterus and left ovary. Electronically Signed   By: Elberta Fortisaniel  Boyle M.D.   On: 11/09/2016 13:40   [place] Pelvis Complete  Result Date: 11/09/2016 CLINICAL DATA:  Right pelvic pain 6 months. EXAM: TRANSABDOMINAL AND TRANSVAGINAL ULTRASOUND OF PELVIS DOPPLER ULTRASOUND OF OVARIES TECHNIQUE: Both transabdominal and transvaginal ultrasound examinations of the pelvis were performed. Transabdominal technique was performed for global imaging of the pelvis including uterus, ovaries, adnexal regions, and pelvic cul-de-sac. It was necessary to proceed with endovaginal exam following the transabdominal exam to visualize the endometrium and ovaries. Color and duplex Doppler ultrasound was utilized to evaluate blood flow to the ovaries. COMPARISON:  CT 09/27/2016 FINDINGS: Uterus Measurements: 3.2 x 4.8 x 7.6 cm. No fibroids or other mass visualized. Endometrium Thickness: 9 mm.  No focal abnormality visualized. Right ovary Measurements: 6.9 x 10.5 x 13.1 cm. Large complex cystic mass measuring 5.9 x 8.2 x 10.9 cm with predominant cystic component containing uniform low-level internal echoes. No thick septations or obvious mural nodularity. Left ovary Measurements: 2.2 x 2.3 x 3.5 cm. Normal appearance/no adnexal mass. Pulsed Doppler evaluation of both ovaries  demonstrates normal low-resistance arterial and venous waveforms. Other findings No abnormal free fluid. IMPRESSION: Mildly complicated right ovarian cystic mass measuring 5.9 x 8.2 x 10.9 cm. This likely represents a non neoplastic process such as a large follicular cyst, endometrioma or hemorrhagic cyst. Recommend follow-up ultrasound 6 weeks. Normal uterus and left ovary. Electronically Signed   By: Elberta Fortisaniel  Boyle M.D.   On: 11/09/2016 13:40   [place] Art/ven Flow Abd Pelv Doppler  Result Date: 11/09/2016 CLINICAL DATA:  Right pelvic pain 6 months. EXAM: TRANSABDOMINAL AND TRANSVAGINAL ULTRASOUND OF PELVIS DOPPLER ULTRASOUND OF OVARIES TECHNIQUE: Both transabdominal and transvaginal ultrasound examinations of the pelvis were performed. Transabdominal technique was performed for global imaging of the pelvis including uterus, ovaries, adnexal regions, and pelvic cul-de-sac. It was necessary to proceed with endovaginal exam following the transabdominal exam to visualize the endometrium and ovaries. Color and duplex Doppler ultrasound was utilized to evaluate blood flow to  the ovaries. COMPARISON:  CT 09/27/2016 FINDINGS: Uterus Measurements: 3.2 x 4.8 x 7.6 cm. No fibroids or other mass visualized. Endometrium Thickness: 9 mm.  No focal abnormality visualized. Right ovary Measurements: 6.9 x 10.5 x 13.1 cm. Large complex cystic mass measuring 5.9 x 8.2 x 10.9 cm with predominant cystic component containing uniform low-level internal echoes. No thick septations or obvious mural nodularity. Left ovary Measurements: 2.2 x 2.3 x 3.5 cm. Normal appearance/no adnexal mass. Pulsed Doppler evaluation of both ovaries demonstrates normal low-resistance arterial and venous waveforms. Other findings No abnormal free fluid. IMPRESSION: Mildly complicated right ovarian cystic mass measuring 5.9 x 8.2 x 10.9 cm. This likely represents a non neoplastic process such as a large follicular cyst, endometrioma or hemorrhagic cyst.  Recommend follow-up ultrasound 6 weeks. Normal uterus and left ovary. Electronically Signed   By: Elberta Fortis M.D.   On: 11/09/2016 13:40    MDM  Patient care handoff received from [facility], PA-C. Plan: Finish potassium infusion. Reassess. Discharge.  Pain-free upon my assessment. No abdominal tenderness. PO challenge successful. Patient has close followup at [facility] on morning of 5/24. She states she will get an orange card and then be able to follow up with [facility] hospital outpatient clinic to address her ovarian cyst.   Vitals:   11/14/16 2022 11/14/16 2218 11/14/16 2245 11/14/16 2300  BP: (!) 173/76 (!) 144/89 (!) 163/107 (!) 162/102  Pulse: 77 72 (!) 51 (!) 33  Resp: 18 20 14 12   Temp: 98.8 F (37.1 C) 98.2 F (36.8 C)    TempSrc: Oral Oral    SpO2: 100% 99% 100% 99%  Weight:      Height:          Anselm Pancoast, PA-C 11/15/16 1610

## 2016-11-14 NOTE — ED Notes (Signed)
Called pt to be placed into a room, pt did not answer.

## 2016-11-14 NOTE — ED Notes (Signed)
No answer x3

## 2016-11-15 ENCOUNTER — Ambulatory Visit: Payer: Self-pay | Attending: Internal Medicine | Admitting: Physician Assistant

## 2016-11-15 ENCOUNTER — Encounter: Payer: Self-pay | Admitting: Physician Assistant

## 2016-11-15 VITALS — BP 149/92 | HR 56 | Temp 98.2°F | Resp 16 | Wt 249.2 lb

## 2016-11-15 DIAGNOSIS — F12988 Cannabis use, unspecified with other cannabis-induced disorder: Secondary | ICD-10-CM

## 2016-11-15 DIAGNOSIS — R1116 Cannabis hyperemesis syndrome: Secondary | ICD-10-CM

## 2016-11-15 DIAGNOSIS — N838 Other noninflammatory disorders of ovary, fallopian tube and broad ligament: Secondary | ICD-10-CM

## 2016-11-15 DIAGNOSIS — J45909 Unspecified asthma, uncomplicated: Secondary | ICD-10-CM | POA: Insufficient documentation

## 2016-11-15 DIAGNOSIS — R102 Pelvic and perineal pain unspecified side: Secondary | ICD-10-CM

## 2016-11-15 DIAGNOSIS — G43A1 Cyclical vomiting, intractable: Secondary | ICD-10-CM

## 2016-11-15 DIAGNOSIS — N839 Noninflammatory disorder of ovary, fallopian tube and broad ligament, unspecified: Secondary | ICD-10-CM

## 2016-11-15 DIAGNOSIS — Z880 Allergy status to penicillin: Secondary | ICD-10-CM | POA: Insufficient documentation

## 2016-11-15 DIAGNOSIS — E876 Hypokalemia: Secondary | ICD-10-CM

## 2016-11-15 DIAGNOSIS — R112 Nausea with vomiting, unspecified: Secondary | ICD-10-CM | POA: Insufficient documentation

## 2016-11-15 DIAGNOSIS — R1115 Cyclical vomiting syndrome unrelated to migraine: Secondary | ICD-10-CM

## 2016-11-15 DIAGNOSIS — E669 Obesity, unspecified: Secondary | ICD-10-CM | POA: Insufficient documentation

## 2016-11-15 DIAGNOSIS — R030 Elevated blood-pressure reading, without diagnosis of hypertension: Secondary | ICD-10-CM

## 2016-11-15 DIAGNOSIS — N83201 Unspecified ovarian cyst, right side: Secondary | ICD-10-CM | POA: Insufficient documentation

## 2016-11-15 DIAGNOSIS — T407X5A Adverse effect of cannabis (derivatives), initial encounter: Secondary | ICD-10-CM | POA: Insufficient documentation

## 2016-11-15 DIAGNOSIS — F1290 Cannabis use, unspecified, uncomplicated: Secondary | ICD-10-CM

## 2016-11-15 LAB — POCT URINALYSIS DIPSTICK
GLUCOSE UA: NEGATIVE
KETONES UA: 15
Leukocytes, UA: NEGATIVE
Nitrite, UA: NEGATIVE
Protein, UA: 100
SPEC GRAV UA: 1.02 (ref 1.010–1.025)
UROBILINOGEN UA: 4 U/dL — AB
pH, UA: 7 (ref 5.0–8.0)

## 2016-11-15 MED ORDER — ONDANSETRON 4 MG PO TBDP
8.0000 mg | ORAL_TABLET | Freq: Once | ORAL | Status: AC
  Administered 2016-11-15: 8 mg via ORAL

## 2016-11-15 MED ORDER — PROMETHAZINE HCL 25 MG RE SUPP: 25.0000 mg | Freq: Four times a day (QID) | RECTAL | 0 refills | Status: DC | PRN

## 2016-11-15 MED ORDER — POTASSIUM CHLORIDE CRYS ER 20 MEQ PO TBCR: 20.0000 meq | EXTENDED_RELEASE_TABLET | Freq: Every day | ORAL | 0 refills | Status: DC

## 2016-11-15 MED ORDER — ONDANSETRON 4 MG PO TBDP: 4.0000 mg | ORAL_TABLET | Freq: Three times a day (TID) | ORAL | 0 refills | Status: DC | PRN

## 2016-11-15 MED ORDER — IBUPROFEN 200 MG PO TABS
600.0000 mg | ORAL_TABLET | Freq: Once | ORAL | Status: AC
  Administered 2016-11-15: 600 mg via ORAL

## 2016-11-15 MED ORDER — IBUPROFEN 600 MG PO TABS
600.0000 mg | ORAL_TABLET | Freq: Four times a day (QID) | ORAL | 0 refills | Status: DC | PRN
Start: 2016-11-15 — End: 2017-01-28

## 2016-11-15 MED FILL — ?POTASSIUM CL ER 20 MEQ TAB: 20 | 10 days supply | Qty: 10 | Fill #0

## 2016-11-15 MED FILL — ONDANSETRON ODT 4 MG TABLET: 4 | 13 days supply | Qty: 40 | Fill #0

## 2016-11-15 MED FILL — IBUPROFEN 600 MG TABLET: 600 | 10 days supply | Qty: 40 | Fill #0

## 2016-11-15 NOTE — Progress Notes (Signed)
Patient ID: Leslie Yates, female   DOB: Jun 07, 1991, 26 y.o.   MRN: [id number]    Leslie Yates, is a 26 y.o. female  NWG:[id number]  [id number]  DOB - Jun 07, 1991  Subjective:  Chief Complaint and HPI: Leslie Yates is a 26 y.o. female here today to establish care and for a follow up visit After multiple ED visits for hyperemesis and R ovarian cystic mass.  She was admitted 5/18-5/19 and was also found to have low potassium which was repleted.  She returned to the ED 5/20, 5/21, 5/22, and 5/23 for the same and was advised to f/up here.   She had had other ED visits for similar over the last 6-9 months.  The N/V is being attributed to Hyperemesis cannabinoid syndrome.  Symptoms overall seem to improve briefly after IVF, IV toradol, and compazine.    Pelvic U/S 11/09/2016:  IMPRESSION: R Mildly complicated right ovarian cystic mass measuring 5.9 x 8.2 x 10.9 cm. This likely represents a non neoplastic process such as a large follicular cyst, endometrioma or hemorrhagic cyst. Recommend follow-up ultrasound 6 weeks. Normal uterus and left ovary.  L Ovary appeared normal. (this was originally identified on CT abd/pelvis and U/S 03/01/2017-patient was advised to see gyn but lost to follow-up due to lack of insurance.)  She presents to the lobby of our office this morning actively vomiting and dry-heaving.  She is given 8mg  zofran ODT and 600mg  Ibuprofen stat.  Symptoms improving about 20 mins after dosing.  She didn't vomit at all overnight, but resumed vomiting this morning.  She has not been able to get any of the prescriptions she has been prescribed from the ED due to cost, so, she has had no way to keep symptoms at bay than returning to the ED.  Her appetite has been poor.  She is trying to get orange card.  Her symptoms were intermittent since 02/2016 until around 5/18 when her symptoms started becoming persistent daily and worse.  She denies vaginal discharge.  Her period is ending now.  All pregnancy  testing has been negative.  OCP have not been prescribed due to concern for worsening of N/V plus DVT concerns bc she is a smoker.  She has not been smoking pot over the last week or 2.     ED/Hospital notes reviewed.    ROS:   Constitutional:  No f/c, No night sweats, No unexplained weight loss. EENT:  No vision changes, No blurry vision, No hearing changes. No mouth, throat, or ear problems.  Respiratory: No cough, No SOB Cardiac: No CP, no palpitations GI:  + abd pain, + N/V and occasional D. GU: No Urinary s/sx Musculoskeletal: No joint pain Neuro: No headache, no dizziness, no motor weakness.  Skin: No rash Endocrine:  No polydipsia. No polyuria.  Psych: Denies SI/HI  No problems updated.  ALLERGIES: Allergies  Allergen Reactions  . Penicillins Rash    Childhood rash Has patient had a PCN reaction causing immediate rash, facial/tongue/throat swelling, SOB or lightheadedness with hypotension: no Has patient had a PCN reaction causing severe rash involving mucus membranes or skin necrosis:no Has patient had a PCN reaction that required hospitalization no Has patient had a PCN reaction occurring within the last 10 years: no If all of the above answers are "NO", then may proceed with Cephalosporin use.     PAST MEDICAL HISTORY: Past Medical History:  Diagnosis Date  . Asthma   . Obesity   . Ovarian cyst  MEDICATIONS AT HOME: Prior to Admission medications   Medication Sig Start Date End Date Taking? Authorizing Provider  ibuprofen (ADVIL,MOTRIN) 600 MG tablet Take 1 tablet (600 mg total) by mouth every 6 (six) hours as needed for moderate pain or cramping. 11/15/16   Anders Simmonds, PA-C  LORazepam (ATIVAN) 1 MG tablet Take 1 tablet (1 mg total) by mouth 3 (three) times daily as needed for anxiety. 11/11/16   Gerhard Munch, MD  ondansetron (ZOFRAN ODT) 4 MG disintegrating tablet Take 1 tablet (4 mg total) by mouth every 8 (eight) hours as needed for nausea or  vomiting. 11/15/16   Anders Simmonds, PA-C  potassium chloride SA (K-DUR,KLOR-CON) 20 MEQ tablet Take 1 tablet (20 mEq total) by mouth daily. 11/15/16 11/20/16  Anders Simmonds, PA-C  prochlorperazine (COMPAZINE) 10 MG tablet Take 10 mg by mouth every 6 (six) hours as needed for nausea or vomiting.    [provider]  promethazine (PHENERGAN) 25 MG suppository Place 1 suppository (25 mg total) rectally every 6 (six) hours as needed for nausea or vomiting. 11/15/16   Anders Simmonds, PA-C     Objective:  EXAM:   Vitals:   11/15/16 0942 11/15/16 0946  BP: (!) 144/97 (!) 149/92  Pulse: (!) 56   Resp: 16   Temp: 98.2 F (36.8 C)   TempSrc: Oral   SpO2: 100%   Weight: 249 lb 3.2 oz (113 kg)     General appearance : A&OX3. NAD. Non-toxic-appearing, does appear anxious at times, does not appear to feel well.  No vomiting or heaving during the time I am seeing her or after administration of Zofran. HEENT: Atraumatic and Normocephalic.  PERRLA. EOM intact.  TM clear B. Mouth-MMM, post pharynx WNL w/o erythema, No PND. Neck: supple, no JVD. No cervical lymphadenopathy. No thyromegaly Chest/Lungs:  Breathing-non-labored, Good air entry bilaterally, breath sounds normal without rales, rhonchi, or wheezing  CVS: S1 S2 regular, no murmurs, gallops, rubs  Abdomen: Bowel sounds present, Non tender and not distended with no gaurding, rigidity or rebound.  No localized TTP Extremities: Bilateral Lower Ext shows no edema, both legs are warm to touch with = pulse throughout Neurology:  CN II-XII grossly intact, Non focal.   Psych:  TP linear. J/I WNL. Normal speech. Appropriate eye contact and affect.  Skin:  No Rash  Data Review No results found for: HGBA1C   Assessment & Plan   1. Pelvic pain Known etiology likely R ovarian mass.  Resolving after giving Ibuprofen - Comprehensive metabolic panel - CBC with Differential/Platelet - POCT urinalysis dipstick - Urine cytology  ancillary only.  Check for STI - ibuprofen (ADVIL,MOTRIN) tablet 600 mg; Take 3 tablets (600 mg total) by mouth once in office. - ibuprofen (ADVIL,MOTRIN) 600 MG tablet; Take 1 tablet (600 mg total) by mouth every 6 (six) hours as needed for moderate pain or cramping.  Dispense: 40 tablet; Refill: 0 - Ambulatory referral to Gynecology  2. Cannabinoid hyperemesis syndrome (HCC) Cessation discussed and encouraged.  3. Intractable cyclical vomiting with nausea Prior to today, she has been unable to get prescriptions; we are helping her with this.   - Comprehensive metabolic panel - CBC with Differential/Platelet - POCT urinalysis dipstick - ondansetron (ZOFRAN-ODT) disintegrating tablet 8 mg; Take 2 tablets (8 mg total) by mouth once in office. - Ambulatory referral to Gynecology Rx Zofran to our pharmacy.    4. Hypokalemia - Comprehensive metabolic panel - potassium chloride SA (K-DUR,KLOR-CON) 20 MEQ tablet; Take  1 tablet (20 mEq total) by mouth daily.  Dispense: 10 tablet; Refill: 0 Hydration imperative!!!  5. Ovarian mass, right - Ambulatory referral to Gynecology Discussed warnings to go to ED if worsens/fever develops.  6. Elevated BP without diagnosis of hypertension Check BP OOO 3-5 times before f/up and bring in BP log  Patient have been counseled extensively about nutrition and exercise  Return in about 1 week (around 11/22/2016) for assign PCP; f/up pelvic pain/N/V.  The patient was given clear instructions to go to ER or return to medical center if symptoms don't improve, worsen or new problems develop. The patient verbalized understanding. The patient was told to call to get lab results if they haven't heard anything in the next week.   [place], PA-C [facility] [place], [place] [phone]   11/15/2016, 9:50 AM

## 2016-11-16 ENCOUNTER — Ambulatory Visit: Payer: Self-pay

## 2016-11-16 ENCOUNTER — Ambulatory Visit: Payer: Self-pay | Attending: Family Medicine

## 2016-11-16 LAB — CBC WITH DIFFERENTIAL/PLATELET
BASOS ABS: 0 10*3/uL (ref 0.0–0.2)
Basos: 0 %
EOS (ABSOLUTE): 0 10*3/uL (ref 0.0–0.4)
Eos: 0 %
Hematocrit: 42.1 % (ref 34.0–46.6)
Hemoglobin: 14.6 g/dL (ref 11.1–15.9)
IMMATURE GRANS (ABS): 0 10*3/uL (ref 0.0–0.1)
Immature Granulocytes: 0 %
LYMPHS: 16 %
Lymphocytes Absolute: 1.5 10*3/uL (ref 0.7–3.1)
MCH: 31.2 pg (ref 26.6–33.0)
MCHC: 34.7 g/dL (ref 31.5–35.7)
MCV: 90 fL (ref 79–97)
Monocytes Absolute: 0.5 10*3/uL (ref 0.1–0.9)
Monocytes: 6 %
NEUTROS ABS: 7.3 10*3/uL — AB (ref 1.4–7.0)
Neutrophils: 78 %
PLATELETS: 229 10*3/uL (ref 150–379)
RBC: 4.68 x10E6/uL (ref 3.77–5.28)
RDW: 14 % (ref 12.3–15.4)
WBC: 9.4 10*3/uL (ref 3.4–10.8)

## 2016-11-16 LAB — COMPREHENSIVE METABOLIC PANEL
A/G RATIO: 1.7 (ref 1.2–2.2)
ALK PHOS: 38 IU/L — AB (ref 39–117)
ALT: 10 IU/L (ref 0–32)
AST: 13 IU/L (ref 0–40)
Albumin: 4.3 g/dL (ref 3.5–5.5)
BILIRUBIN TOTAL: 1.2 mg/dL (ref 0.0–1.2)
BUN/Creatinine Ratio: 9 (ref 9–23)
BUN: 8 mg/dL (ref 6–20)
CHLORIDE: 99 mmol/L (ref 96–106)
CO2: 23 mmol/L (ref 18–29)
Calcium: 9.4 mg/dL (ref 8.7–10.2)
Creatinine, Ser: 0.87 mg/dL (ref 0.57–1.00)
GFR calc non Af Amer: 93 mL/min/{1.73_m2} (ref >59)
GFR, EST AFRICAN AMERICAN: 107 mL/min/{1.73_m2} (ref >59)
GLUCOSE: 96 mg/dL (ref 65–99)
Globulin, Total: 2.6 g/dL (ref 1.5–4.5)
Potassium: 3.3 mmol/L — ABNORMAL LOW (ref 3.5–5.2)
Sodium: 139 mmol/L (ref 134–144)
TOTAL PROTEIN: 6.9 g/dL (ref 6.0–8.5)

## 2016-11-20 LAB — URINE CYTOLOGY ANCILLARY ONLY
Chlamydia: NEGATIVE
Neisseria Gonorrhea: NEGATIVE
Trichomonas: NEGATIVE

## 2016-11-22 ENCOUNTER — Encounter: Payer: Self-pay | Admitting: Obstetrics and Gynecology

## 2016-11-26 ENCOUNTER — Ambulatory Visit: Payer: Self-pay | Attending: Family Medicine | Admitting: Family Medicine

## 2016-11-26 ENCOUNTER — Telehealth: Payer: Self-pay

## 2016-11-26 VITALS — BP 118/71 | HR 69 | Temp 98.1°F | Resp 18 | Ht 73.0 in | Wt 267.4 lb

## 2016-11-26 DIAGNOSIS — E876 Hypokalemia: Secondary | ICD-10-CM

## 2016-11-26 DIAGNOSIS — N83201 Unspecified ovarian cyst, right side: Secondary | ICD-10-CM

## 2016-11-26 DIAGNOSIS — K5900 Constipation, unspecified: Secondary | ICD-10-CM

## 2016-11-26 DIAGNOSIS — Z79899 Other long term (current) drug therapy: Secondary | ICD-10-CM | POA: Insufficient documentation

## 2016-11-26 DIAGNOSIS — J45909 Unspecified asthma, uncomplicated: Secondary | ICD-10-CM | POA: Insufficient documentation

## 2016-11-26 DIAGNOSIS — Z8709 Personal history of other diseases of the respiratory system: Secondary | ICD-10-CM

## 2016-11-26 MED ORDER — SENNOSIDES-DOCUSATE SODIUM 8.6-50 MG PO TABS: 1.0000 | ORAL_TABLET | Freq: Every evening | ORAL | Status: DC | PRN

## 2016-11-26 MED ORDER — POLYETHYLENE GLYCOL 3350 17 GM/SCOOP PO POWD: 17.0000 g | Freq: Every day | ORAL | 1 refills | Status: DC | PRN

## 2016-11-26 MED ORDER — ALBUTEROL SULFATE HFA 108 (90 BASE) MCG/ACT IN AERS: 2.0000 | INHALATION_SPRAY | Freq: Four times a day (QID) | RESPIRATORY_TRACT | 2 refills | Status: DC | PRN

## 2016-11-26 MED FILL — POLYETHYLENE GLYCOL 3350: 15 days supply | Qty: 255 | Fill #0

## 2016-11-26 MED FILL — PROVENTIL HFA 108 (90 BASE): 108 (90 BAS | 15 days supply | Qty: 7 | Fill #0

## 2016-11-26 MED FILL — PHENADOZ 25 MG SUPP: 25 | 3 days supply | Qty: 12 | Fill #0

## 2016-11-26 NOTE — Telephone Encounter (Signed)
Contacted pt to go over lab results pt is aware of results and doesn't have any questions or concerns 

## 2016-11-26 NOTE — Progress Notes (Signed)
Patient is here for f/up  Patient needs a referral to GYN

## 2016-11-26 NOTE — Patient Instructions (Signed)
High-Fiber Diet Fiber, also called dietary fiber, is a type of carbohydrate found in fruits, vegetables, whole grains, and beans. A high-fiber diet can have many health benefits. Your health care provider may recommend a high-fiber diet to help:  Prevent constipation. Fiber can make your bowel movements more regular.  Lower your cholesterol.  Relieve hemorrhoids, uncomplicated diverticulosis, or irritable bowel syndrome.  Prevent overeating as part of a weight-loss plan.  Prevent heart disease, type 2 diabetes, and certain cancers. What is my plan? The recommended daily intake of fiber includes:  38 grams for men under age 50.  30 grams for men over age 50.  25 grams for women under age 50.  21 grams for women over age 50. You can get the recommended daily intake of dietary fiber by eating a variety of fruits, vegetables, grains, and beans. Your health care provider may also recommend a fiber supplement if it is not possible to get enough fiber through your diet. What do I need to know about a high-fiber diet?  Fiber supplements have not been widely studied for their effectiveness, so it is better to get fiber through food sources.  Always check the fiber content on thenutrition facts label of any prepackaged food. Look for foods that contain at least 5 grams of fiber per serving.  Ask your dietitian if you have questions about specific foods that are related to your condition, especially if those foods are not listed in the following section.  Increase your daily fiber consumption gradually. Increasing your intake of dietary fiber too quickly may cause bloating, cramping, or gas.  Drink plenty of water. Water helps you to digest fiber. What foods can I eat? Grains  Whole-grain breads. Multigrain cereal. Oats and oatmeal. Brown rice. Barley. Bulgur wheat. Millet. Bran muffins. Popcorn. Rye wafer crackers. Vegetables  Sweet potatoes. Spinach. Kale. Artichokes. Cabbage. Broccoli.  Green peas. Carrots. Squash. Fruits  Berries. Pears. Apples. Oranges. Avocados. Prunes and raisins. Dried figs. Meats and Other Protein Sources  [place]y, kidney, pinto, and soy beans. Split peas. Lentils. Nuts and seeds. Dairy  Fiber-fortified yogurt. Beverages  Fiber-fortified soy milk. Fiber-fortified orange juice. Other  Fiber bars. The items listed above may not be a complete list of recommended foods or beverages. Contact your dietitian for more options.  What foods are not recommended? Grains  White bread. Pasta made with refined flour. White rice. Vegetables  Fried potatoes. Canned vegetables. Well-cooked vegetables. Fruits  Fruit juice. Cooked, strained fruit. Meats and Other Protein Sources  Fatty cuts of meat. Fried poultry or fried fish. Dairy  Milk. Yogurt. Cream cheese. Sour cream. Beverages  Soft drinks. Other  Cakes and pastries. Butter and oils. The items listed above may not be a complete list of foods and beverages to avoid. Contact your dietitian for more information.  What are some tips for including high-fiber foods in my diet?  Eat a wide variety of high-fiber foods.  Make sure that half of all grains consumed each day are whole grains.  Replace breads and cereals made from refined flour or white flour with whole-grain breads and cereals.  Replace white rice with brown rice, bulgur wheat, or millet.  Start the day with a breakfast that is high in fiber, such as a cereal that contains at least 5 grams of fiber per serving.  Use beans in place of meat in soups, salads, or pasta.  Eat high-fiber snacks, such as berries, raw vegetables, nuts, or popcorn. This information is not intended to replace   advice given to you by your health care provider. Make sure you discuss any questions you have with your health care provider. Document Released: 06/11/2005 Document Revised: 11/17/2015 Document Reviewed: 11/24/2013 Elsevier Interactive Patient Education  2017  Elsevier Inc.  Constipation, Adult Constipation is when a person:  Poops (has a bowel movement) fewer times in a week than normal.  Has a hard time pooping.  Has poop that is dry, hard, or bigger than normal. Follow these instructions at home: Eating and drinking  Eat foods that have a lot of fiber, such as:  Fresh fruits and vegetables.  Whole grains.  Beans.  Eat less of foods that are high in fat, low in fiber, or overly processed, such as:  French fries.  Hamburgers.  Cookies.  Candy.  Soda.  Drink enough fluid to keep your pee (urine) clear or pale yellow. General instructions  Exercise regularly or as told by your doctor.  Go to the restroom when you feel like you need to poop. Do not hold it in.  Take over-the-counter and prescription medicines only as told by your doctor. These include any fiber supplements.  Do pelvic floor retraining exercises, such as:  Doing deep breathing while relaxing your lower belly (abdomen).  Relaxing your pelvic floor while pooping.  Watch your condition for any changes.  Keep all follow-up visits as told by your doctor. This is important. Contact a doctor if:  You have pain that gets worse.  You have a fever.  You have not pooped for 4 days.  You throw up (vomit).  You are not hungry.  You lose weight.  You are bleeding from the anus.  You have thin, pencil-like poop (stool). Get help right away if:  You have a fever, and your symptoms suddenly get worse.  You leak poop or have blood in your poop.  Your belly feels hard or bigger than normal (is bloated).  You have very bad belly pain.  You feel dizzy or you faint. This information is not intended to replace advice given to you by your health care provider. Make sure you discuss any questions you have with your health care provider. Document Released: 11/28/2007 Document Revised: 12/30/2015 Document Reviewed: 11/30/2015 Elsevier Interactive Patient  Education  2017 Elsevier Inc.  

## 2016-11-26 NOTE — Progress Notes (Signed)
Subjective:  Patient ID: Leslie Yates, female    DOB: 1991/02/10  Age: 26 y.o. MRN: [id number]  CC: Establish Care   HPI Leslie Yates presents for follow up and  referral to gynecology. History of frequent ED visits related to right ovarian cyst mass and hyperemesis. Hyperemesis was thought to be attributed to cannabinoid syndrome. She denies any mariajuana use since last ED visit in May. She denies any pelvic pain, dysuria, vaginal lesion or discharge. History of hypokalemia. She reports being inconsistent with potassium supplementation due to pill size. She reports not completing potassium supplement course. She denies any CP or palpations.   Outpatient Medications Prior to Visit  Medication Sig Dispense Refill  . ibuprofen (ADVIL,MOTRIN) 600 MG tablet Take 1 tablet (600 mg total) by mouth every 6 (six) hours as needed for moderate pain or cramping. 40 tablet 0  . LORazepam (ATIVAN) 1 MG tablet Take 1 tablet (1 mg total) by mouth 3 (three) times daily as needed for anxiety. 15 tablet 0  . prochlorperazine (COMPAZINE) 10 MG tablet Take 10 mg by mouth every 6 (six) hours as needed for nausea or vomiting.    . ondansetron (ZOFRAN ODT) 4 MG disintegrating tablet Take 1 tablet (4 mg total) by mouth every 8 (eight) hours as needed for nausea or vomiting. 40 tablet 0  . potassium chloride SA (K-DUR,KLOR-CON) 20 MEQ tablet Take 1 tablet (20 mEq total) by mouth daily. 10 tablet 0  . promethazine (PHENERGAN) 25 MG suppository Place 1 suppository (25 mg total) rectally every 6 (six) hours as needed for nausea or vomiting. 12 each 0   No facility-administered medications prior to visit.     ROS Review of Systems  Constitutional: Negative.   HENT: Negative.   Respiratory: Negative.   Cardiovascular: Negative.   Gastrointestinal: Negative.   Genitourinary: Positive for pelvic pain.    Objective:  BP 118/71 (BP Location: Left Arm, Patient Position: Sitting, Cuff Size: Normal)   Pulse 69    Temp 98.1 F (36.7 C) (Oral)   Resp 18   Ht 6\' 1"  (1.854 m)   Wt 267 lb 6.4 oz (121.3 kg)   LMP 11/12/2016   SpO2 99%   BMI 35.28 kg/m   BP/Weight 11/26/2016 11/15/2016 11/14/2016  Systolic BP 118 149 162  Diastolic BP 71 92 102  Wt. (Lbs) 267.4 249.2 259  BMI 35.28 32.88 34.17    Physical Exam  Constitutional: She appears well-developed and well-nourished.  HENT:  Head: Normocephalic.  Nose: Nose normal.  Mouth/Throat: Oropharynx is clear and moist.  Neck: Normal range of motion. Neck supple.  Cardiovascular: Normal rate, regular rhythm, normal heart sounds and intact distal pulses.   Pulmonary/Chest: Effort normal and breath sounds normal.  Abdominal: Soft. Bowel sounds are normal. There is no tenderness.  Psychiatric: She has a normal mood and affect.  Nursing note and vitals reviewed.  Assessment & Plan:   Problem List Items Addressed This Visit      Genitourinary   Ovarian cyst, right - Primary   Relevant Orders   Ambulatory referral to Gynecology   US Transvaginal Non-OB    Other Visit Diagnoses    Constipation, unspecified constipation type       Relevant Medications   polyethylene glycol powder (GLYCOLAX/MIRALAX) powder   senna-docusate (SENOKOT-S) 8.6-50 MG tablet   Hypokalemia       Relevant Orders   Basic metabolic panel   History of asthma       Relevant Medications  albuterol (PROVENTIL HFA;VENTOLIN HFA) 108 (90 Base) MCG/ACT inhaler      Meds ordered this encounter  Medications  . polyethylene glycol powder (GLYCOLAX/MIRALAX) powder    Sig: Take 17 g by mouth daily as needed for mild constipation or moderate constipation.    Dispense:  250 g    Refill:  1    Order Specific Question:   Supervising Provider    Answer:   Quentin Angst [id number]  . senna-docusate (SENOKOT-S) 8.6-50 MG tablet    Sig: Take 1 tablet by mouth at bedtime as needed for mild constipation or moderate constipation.    Order Specific Question:   Supervising Provider     Answer:   Quentin Angst [id number]  . albuterol (PROVENTIL HFA;VENTOLIN HFA) 108 (90 Base) MCG/ACT inhaler    Sig: Inhale 2 puffs into the lungs every 6 (six) hours as needed for wheezing or shortness of breath.    Dispense:  1 Inhaler    Refill:  2    Order Specific Question:   Supervising Provider    Answer:   Quentin Angst [id number]    Follow-up: No Follow-up on file.   Lizbeth Bark FNP

## 2016-12-06 ENCOUNTER — Other Ambulatory Visit: Payer: Self-pay

## 2016-12-17 ENCOUNTER — Encounter: Payer: Self-pay | Admitting: *Deleted

## 2016-12-17 ENCOUNTER — Encounter: Payer: Self-pay | Admitting: Obstetrics and Gynecology

## 2016-12-17 NOTE — Progress Notes (Signed)
Patient no showed for her appointment today. Per Dr Alysia PennaErvin, she may reschedule prn.

## 2017-01-09 ENCOUNTER — Ambulatory Visit (HOSPITAL_COMMUNITY): Admission: RE | Admit: 2017-01-09 | Payer: Self-pay | Source: Ambulatory Visit

## 2017-01-28 ENCOUNTER — Encounter: Payer: Self-pay | Admitting: Family Medicine

## 2017-01-28 ENCOUNTER — Ambulatory Visit: Payer: Self-pay | Attending: Family Medicine | Admitting: Family Medicine

## 2017-01-28 VITALS — BP 102/64 | HR 63 | Temp 98.4°F | Resp 18 | Ht 72.0 in | Wt 277.8 lb

## 2017-01-28 DIAGNOSIS — K047 Periapical abscess without sinus: Secondary | ICD-10-CM | POA: Insufficient documentation

## 2017-01-28 DIAGNOSIS — K029 Dental caries, unspecified: Secondary | ICD-10-CM | POA: Insufficient documentation

## 2017-01-28 MED ORDER — BENZOCAINE 10 % MT GEL: 1.0000 "application " | OROMUCOSAL | 0 refills | Status: DC | PRN

## 2017-01-28 MED ORDER — IBUPROFEN 600 MG PO TABS: 600.0000 mg | ORAL_TABLET | Freq: Four times a day (QID) | ORAL | 0 refills | Status: DC | PRN

## 2017-01-28 MED ORDER — DOXYCYCLINE HYCLATE 100 MG PO TABS: 100.0000 mg | ORAL_TABLET | Freq: Two times a day (BID) | ORAL | 0 refills | Status: DC

## 2017-01-28 MED ORDER — TRAMADOL HCL 50 MG PO TABS: 50.0000 mg | ORAL_TABLET | Freq: Three times a day (TID) | ORAL | 0 refills | Status: DC | PRN

## 2017-01-28 MED FILL — DOXYCYCLINE 100 MG TABLET: 100 | 10 days supply | Qty: 20 | Fill #0

## 2017-01-28 MED FILL — traMADol HCL 50 MG TABS: 50 | 20 days supply | Qty: 30 | Fill #0

## 2017-01-28 MED FILL — IBUPROFEN 600 MG TABLET: 600 | 7 days supply | Qty: 30 | Fill #0

## 2017-01-28 NOTE — Progress Notes (Signed)
Subjective:  Patient ID: Leslie Yates, female    DOB: 1991/03/15  Age: 26 y.o. MRN: [id number]  CC: Dental Pain   HPI Leslie Yates presents for dental pain and abscess. She reports 4 year history with worsening of symptoms 3 days ago. She reports re-occurring gum swelling, and purulent, bloody drainage of the course of 8 months. She reports using Orajel and ibuprofen for symptoms with minimal relief. She denies any facial swelling, tongue or throat swelling, or SOB.  Outpatient Medications Prior to Visit  Medication Sig Dispense Refill  . albuterol (PROVENTIL HFA;VENTOLIN HFA) 108 (90 Base) MCG/ACT inhaler Inhale 2 puffs into the lungs every 6 (six) hours as needed for wheezing or shortness of breath. 1 Inhaler 2  . LORazepam (ATIVAN) 1 MG tablet Take 1 tablet (1 mg total) by mouth 3 (three) times daily as needed for anxiety. 15 tablet 0  . ondansetron (ZOFRAN ODT) 4 MG disintegrating tablet Take 1 tablet (4 mg total) by mouth every 8 (eight) hours as needed for nausea or vomiting. 40 tablet 0  . polyethylene glycol powder (GLYCOLAX/MIRALAX) powder Take 17 g by mouth daily as needed for mild constipation or moderate constipation. 250 g 1  . potassium chloride SA (K-DUR,KLOR-CON) 20 MEQ tablet Take 1 tablet (20 mEq total) by mouth daily. 10 tablet 0  . prochlorperazine (COMPAZINE) 10 MG tablet Take 10 mg by mouth every 6 (six) hours as needed for nausea or vomiting.    . promethazine (PHENERGAN) 25 MG suppository Place 1 suppository (25 mg total) rectally every 6 (six) hours as needed for nausea or vomiting. 12 each 0  . senna-docusate (SENOKOT-S) 8.6-50 MG tablet Take 1 tablet by mouth at bedtime as needed for mild constipation or moderate constipation.    Marland Kitchen. ibuprofen (ADVIL,MOTRIN) 600 MG tablet Take 1 tablet (600 mg total) by mouth every 6 (six) hours as needed for moderate pain or cramping. 40 tablet 0   No facility-administered medications prior to visit.     ROS Review of Systems    Constitutional: Negative.   HENT: Positive for dental problem.   Eyes: Negative.   Respiratory: Negative.   Cardiovascular: Negative.   Gastrointestinal: Negative.     Objective:  BP 102/64 (BP Location: Left Arm, Patient Position: Sitting, Cuff Size: Normal)   Pulse 63   Temp 98.4 F (36.9 C) (Oral)   Resp 18   Ht 6' (1.829 m)   Wt 277 lb 12.8 oz (126 kg)   LMP 01/14/2017   SpO2 99%   BMI 37.68 kg/m   BP/Weight 01/30/2017 01/29/2017 01/28/2017  Systolic BP 135 147 102  Diastolic BP 73 99 64  Wt. (Lbs) 270 - 277.8  BMI 35.62 - 37.68     Physical Exam  HENT:  Head: Normocephalic and atraumatic.  Right Ear: External ear normal.  Left Ear: External ear normal.  Nose: Nose normal.  Mouth/Throat: Oropharynx is clear and moist. Dental caries (left upper molar) present. No oropharyngeal exudate.  Eyes: Pupils are equal, round, and reactive to light. Conjunctivae are normal.  Cardiovascular: Normal rate, regular rhythm, normal heart sounds and intact distal pulses.   Pulmonary/Chest: Effort normal and breath sounds normal.  Abdominal: Soft. Bowel sounds are normal.  Skin: Skin is warm and dry.  Nursing note and vitals reviewed.  Assessment & Plan:   Problem List Items Addressed This Visit    None    Visit Diagnoses    Dental abscess    -  Primary  Patient does not have orange card. Encouraged to apply.   Patient given list of upcoming dental clinics   Return precautions given.   Relevant Medications   traMADol (ULTRAM) 50 MG tablet   benzocaine (ORAJEL) 10 % mucosal gel   ibuprofen (ADVIL,MOTRIN) 600 MG tablet   doxycycline (VIBRA-TABS) 100 MG tablet   Other Relevant Orders   Ambulatory referral to Dentistry   Dental decay       Relevant Orders   Ambulatory referral to Dentistry      Meds ordered this encounter  Medications  . traMADol (ULTRAM) 50 MG tablet    Sig: Take 1 tablet (50 mg total) by mouth every 8 (eight) hours as needed for severe pain.     Dispense:  30 tablet    Refill:  0    Order Specific Question:   Supervising Provider    Answer:   Quentin Angst [id number]  . benzocaine (ORAJEL) 10 % mucosal gel    Sig: Use as directed 1 application in the mouth or throat as needed for mouth pain.    Dispense:  5.3 g    Refill:  0    Order Specific Question:   Supervising Provider    Answer:   Quentin Angst [id number]  . ibuprofen (ADVIL,MOTRIN) 600 MG tablet    Sig: Take 1 tablet (600 mg total) by mouth every 6 (six) hours as needed for moderate pain (Take with food).    Dispense:  30 tablet    Refill:  0    Order Specific Question:   Supervising Provider    Answer:   Quentin Angst [id number]  . doxycycline (VIBRA-TABS) 100 MG tablet    Sig: Take 1 tablet (100 mg total) by mouth 2 (two) times daily.    Dispense:  20 tablet    Refill:  0    Order Specific Question:   Supervising Provider    Answer:   Quentin Angst [[id number]]    Follow-up: Return in about 2 weeks (around 02/11/2017), or if symptoms worsen or fail to improve, for PAP.   Lizbeth Bark FNP

## 2017-01-28 NOTE — Patient Instructions (Signed)
Doxycycline tablets or capsules What is this medicine? DOXYCYCLINE (dox i SYE kleen) is a tetracycline antibiotic. It kills certain bacteria or stops their growth. It is used to treat many kinds of infections, like dental, skin, respiratory, and urinary tract infections. It also treats acne, Lyme disease, malaria, and certain sexually transmitted infections. This medicine may be used for other purposes; ask your health care provider or pharmacist if you have questions. COMMON BRAND NAME(S): Acticlate, Adoxa, Adoxa CK, Adoxa Pak, Adoxa TT, Alodox, Avidoxy, Doxal, Mondoxyne NL, Monodox, Morgidox 1x, Morgidox 1x Kit, Morgidox 2x, Morgidox 2x Kit, NutriDox, Ocudox, TARGADOX, Vibra-Tabs, Vibramycin What should I tell my health care provider before I take this medicine? They need to know if you have any of these conditions: -liver disease -long exposure to sunlight like working outdoors -stomach problems like colitis -an unusual or allergic reaction to doxycycline, tetracycline antibiotics, other medicines, foods, dyes, or preservatives -pregnant or trying to get pregnant -breast-feeding How should I use this medicine? Take this medicine by mouth with a full glass of water. Follow the directions on the prescription label. It is best to take this medicine without food, but if it upsets your stomach take it with food. Take your medicine at regular intervals. Do not take your medicine more often than directed. Take all of your medicine as directed even if you think you are better. Do not skip doses or stop your medicine early. Talk to your pediatrician regarding the use of this medicine in children. While this drug may be prescribed for selected conditions, precautions do apply. Overdosage: If you think you have taken too much of this medicine contact a poison control center or emergency room at once. NOTE: This medicine is only for you. Do not share this medicine with others. What if I miss a dose? If you  miss a dose, take it as soon as you can. If it is almost time for your next dose, take only that dose. Do not take double or extra doses. What may interact with this medicine? -antacids -barbiturates -birth control pills -bismuth subsalicylate -carbamazepine -methoxyflurane -other antibiotics -phenytoin -vitamins that contain iron -warfarin This list may not describe all possible interactions. Give your health care provider a list of all the medicines, herbs, non-prescription drugs, or dietary supplements you use. Also tell them if you smoke, drink alcohol, or use illegal drugs. Some items may interact with your medicine. What should I watch for while using this medicine? Tell your doctor or health care professional if your symptoms do not improve. Do not treat diarrhea with over the counter products. Contact your doctor if you have diarrhea that lasts more than 2 days or if it is severe and watery. Do not take this medicine just before going to bed. It may not dissolve properly when you lay down and can cause pain in your throat. Drink plenty of fluids while taking this medicine to also help reduce irritation in your throat. This medicine can make you more sensitive to the sun. Keep out of the sun. If you cannot avoid being in the sun, wear protective clothing and use sunscreen. Do not use sun lamps or tanning beds/booths. Birth control pills may not work properly while you are taking this medicine. Talk to your doctor about using an extra method of birth control. If you are being treated for a sexually transmitted infection, avoid sexual contact until you have finished your treatment. Your sexual partner may also need treatment. Avoid antacids, aluminum, calcium, magnesium, and  iron products for 4 hours before and 2 hours after taking a dose of this medicine. If you are using this medicine to prevent malaria, you should still protect yourself from contact with mosquitos. Stay in screened-in  areas, use mosquito nets, keep your body covered, and use an insect repellent. What side effects may I notice from receiving this medicine? Side effects that you should report to your doctor or health care professional as soon as possible: -allergic reactions like skin rash, itching or hives, swelling of the face, lips, or tongue -difficulty breathing -fever -itching in the rectal or genital area -pain on swallowing -redness, blistering, peeling or loosening of the skin, including inside the mouth -severe stomach pain or cramps -unusual bleeding or bruising -unusually weak or tired -yellowing of the eyes or skin Side effects that usually do not require medical attention (report to your doctor or health care professional if they continue or are bothersome): -diarrhea -loss of appetite -nausea, vomiting This list may not describe all possible side effects. Call your doctor for medical advice about side effects. You may report side effects to FDA at 1-800-FDA-1088. Where should I keep my medicine? Keep out of the reach of children. Store at room temperature, below 30 degrees C (86 degrees F). Protect from light. Keep container tightly closed. Throw away any unused medicine after the expiration date. Taking this medicine after the expiration date can make you seriously ill. NOTE: This sheet is a summary. It may not cover all possible information. If you have questions about this medicine, talk to your doctor, pharmacist, or health care provider.  2018 Elsevier/Gold Standard (2015-07-13 17:11:22)   Dental Abscess A dental abscess is pus in or around a tooth. Follow these instructions at home:  Take medicines only as told by your dentist.  If you were prescribed antibiotic medicine, finish all of it even if you start to feel better.  Rinse your mouth (gargle) often with salt water.  Do not drive or use heavy machinery, like a lawn mower, while taking pain medicine.  Do not apply heat  to the outside of your mouth.  Keep all follow-up visits as told by your dentist. This is important. Contact a doctor if:  Your pain is worse, and medicine does not help. Get help right away if:  You have a fever or chills.  Your symptoms suddenly get worse.  You have a very bad headache.  You have problems breathing or swallowing.  You have trouble opening your mouth.  You have puffiness (swelling) in your neck or around your eye. This information is not intended to replace advice given to you by your health care provider. Make sure you discuss any questions you have with your health care provider. Document Released: 10/26/2014 Document Revised: 11/17/2015 Document Reviewed: 06/08/2014 Elsevier Interactive Patient Education  [employer].

## 2017-01-28 NOTE — Progress Notes (Signed)
Patient is here for toothache since 3 days ago   Gum swollen

## 2017-01-29 ENCOUNTER — Encounter (HOSPITAL_COMMUNITY): Payer: Self-pay | Admitting: Emergency Medicine

## 2017-01-29 ENCOUNTER — Emergency Department (HOSPITAL_COMMUNITY)
Admission: EM | Admit: 2017-01-29 | Discharge: 2017-01-29 | Disposition: A | Discharge status: Home / Self Care | Payer: Self-pay | Attending: Emergency Medicine | Admitting: Emergency Medicine

## 2017-01-29 ENCOUNTER — Telehealth: Payer: Self-pay | Admitting: Family Medicine

## 2017-01-29 DIAGNOSIS — J45909 Unspecified asthma, uncomplicated: Secondary | ICD-10-CM | POA: Insufficient documentation

## 2017-01-29 DIAGNOSIS — F17210 Nicotine dependence, cigarettes, uncomplicated: Secondary | ICD-10-CM | POA: Insufficient documentation

## 2017-01-29 DIAGNOSIS — K0889 Other specified disorders of teeth and supporting structures: Secondary | ICD-10-CM | POA: Insufficient documentation

## 2017-01-29 MED ORDER — OXYCODONE-ACETAMINOPHEN 5-325 MG PO TABS
ORAL_TABLET | ORAL | Status: AC
  Filled 2017-01-29: qty 1

## 2017-01-29 MED ORDER — OXYCODONE-ACETAMINOPHEN 5-325 MG PO TABS
1.0000 | ORAL_TABLET | ORAL | Status: DC | PRN
  Administered 2017-01-29: 1 via ORAL

## 2017-01-29 NOTE — Telephone Encounter (Signed)
Pt. Called stating that she was prescribed Tramadol and Ibuprofen for her tooth aches but states it is not helping her. Pt. Also put the gel but still feels the pain. Please f/u with pt.

## 2017-01-29 NOTE — Telephone Encounter (Signed)
Pt. Called stating that she was prescribed Tramadol and Ibuprofen for her tooth aches but states it is not helping her. Pt. Also put the gel but still feels the pain. Please advice?

## 2017-01-29 NOTE — Discharge Instructions (Signed)
Call the dentist circled below within 24 hours and bring your paperwork to your appointment for  to help with your payment. Also see the resources attached to your paperwork for further options. Please return to emergency department if you develops any new or worsening symptoms.

## 2017-01-29 NOTE — ED Triage Notes (Signed)
Pt has dental abscess to left side of mouth and was started on medication yesterday but still having 10/10 pain.

## 2017-01-29 NOTE — ED Provider Notes (Signed)
[facility]-EMERGENCY DEPT Provider Note   CSN: [id number] Arrival date & time: 01/29/17  1447  By signing my name below, I, [name], attest that this documentation has been prepared under the direction and in the presence of [employer], PA-C. Electronically Signed: [name], ED Scribe. 01/29/17. 4:09 PM.  History   Chief Complaint Chief Complaint  Patient presents with  . Abscess   The history is provided by the patient. No language interpreter was used.   HPI Comments: Leslie Yates is a 26 y.o. female who presents to the Emergency Department complaining of persistent "throbbing" severe dental pain with associated facial swelling that began two weeks ago. She was seen by her PCP yesterday where she as prescribed ibuprofen, doxycycline, tramadol and orajel. The pt states that she does not have a dentist, but is currently trying to schedule an appointment. She requests referrals to any dentists in the area. The pt denies fever.   Past Medical History:  Diagnosis Date  . Asthma   . Obesity   . Ovarian cyst     Patient Active Problem List   Diagnosis Date Noted  . Ovarian cyst, right 11/10/2016  . Nausea with vomiting 11/09/2016  . Cannabinoid hyperemesis syndrome (HCC) 11/09/2016    Past Surgical History:  Procedure Laterality Date  . NO PAST SURGERIES      OB History    Gravida Para Term Preterm AB Living   0 0 0 0 0 0   SAB TAB Ectopic Multiple Live Births   0 0 0 0 0       Home Medications    Prior to Admission medications   Medication Sig Start Date End Date Taking? Authorizing Provider  albuterol (PROVENTIL HFA;VENTOLIN HFA) 108 (90 Base) MCG/ACT inhaler Inhale 2 puffs into the lungs every 6 (six) hours as needed for wheezing or shortness of breath. 11/26/16   Lizbeth Bark, FNP  benzocaine (ORAJEL) 10 % mucosal gel Use as directed 1 application in the mouth or throat as needed for mouth pain. 01/28/17   Lizbeth Bark, FNP  doxycycline  (VIBRA-TABS) 100 MG tablet Take 1 tablet (100 mg total) by mouth 2 (two) times daily. 01/28/17   Lizbeth Bark, FNP  ibuprofen (ADVIL,MOTRIN) 600 MG tablet Take 1 tablet (600 mg total) by mouth every 6 (six) hours as needed for moderate pain (Take with food). 01/28/17   Lizbeth Bark, FNP  LORazepam (ATIVAN) 1 MG tablet Take 1 tablet (1 mg total) by mouth 3 (three) times daily as needed for anxiety. 11/11/16   Gerhard Munch, MD  ondansetron (ZOFRAN ODT) 4 MG disintegrating tablet Take 1 tablet (4 mg total) by mouth every 8 (eight) hours as needed for nausea or vomiting. 11/15/16   Anders Simmonds, PA-C  polyethylene glycol powder (GLYCOLAX/MIRALAX) powder Take 17 g by mouth daily as needed for mild constipation or moderate constipation. 11/26/16   Lizbeth Bark, FNP  potassium chloride SA (K-DUR,KLOR-CON) 20 MEQ tablet Take 1 tablet (20 mEq total) by mouth daily. 11/15/16 11/20/16  Anders Simmonds, PA-C  prochlorperazine (COMPAZINE) 10 MG tablet Take 10 mg by mouth every 6 (six) hours as needed for nausea or vomiting.    [provider]  promethazine (PHENERGAN) 25 MG suppository Place 1 suppository (25 mg total) rectally every 6 (six) hours as needed for nausea or vomiting. 11/15/16   Anders Simmonds, PA-C  senna-docusate (SENOKOT-S) 8.6-50 MG tablet Take 1 tablet by mouth at bedtime as needed  for mild constipation or moderate constipation. 11/26/16   Lizbeth BarkHairston, Mandesia R, FNP  traMADol (ULTRAM) 50 MG tablet Take 1 tablet (50 mg total) by mouth every 8 (eight) hours as needed for severe pain. 01/28/17   Lizbeth BarkHairston, Mandesia R, FNP    Family History Family History  Problem Relation Age of Onset  . Diabetes Mother   . Hypertension Mother   . Diabetes Father   . Hypertension Father     Social History Social History  Substance Use Topics  . Smoking status: Current Every Day Smoker    Packs/day: 1.00    Years: 11.00    Types: Cigarettes  . Smokeless tobacco: Current User   . Alcohol use 0.0 oz/week     Comment: Once a month      Allergies   Penicillins   Review of Systems Review of Systems  Constitutional: Negative for fever.  HENT: Positive for dental problem and facial swelling.      Physical Exam Updated Vital Signs BP (!) 147/99 (BP Location: Right Arm)   Pulse 98   Temp 98.5 F (36.9 C) (Oral)   Resp 16   SpO2 100%   Physical Exam  Constitutional: She appears well-developed and well-nourished. No distress.  HENT:  Head: Normocephalic and atraumatic.  Mouth/Throat: Oropharynx is clear and moist. No oropharyngeal exudate.    Eyes: Pupils are equal, round, and reactive to light. Conjunctivae are normal. Right eye exhibits no discharge. Left eye exhibits no discharge. No scleral icterus.  Neck: Normal range of motion. Neck supple. No thyromegaly present.  Cardiovascular: Normal rate, regular rhythm, normal heart sounds and intact distal pulses.  Exam reveals no gallop and no friction rub.   No murmur heard. Pulmonary/Chest: Effort normal and breath sounds normal. No stridor. No respiratory distress. She has no wheezes. She has no rales.  Abdominal: Soft. Bowel sounds are normal. She exhibits no distension. There is no tenderness. There is no rebound and no guarding.  Musculoskeletal: She exhibits no edema.  Lymphadenopathy:    She has no cervical adenopathy.  Neurological: She is alert. Coordination normal.  Skin: Skin is warm and dry. No rash noted. She is not diaphoretic. No pallor.  Psychiatric: She has a normal mood and affect.  Nursing note and vitals reviewed.    ED Treatments / Results   DIAGNOSTIC STUDIES: Oxygen Saturation is 100% on RA, normal by my interpretation.   COORDINATION OF CARE: 3:47 PM-Discussed next steps with pt. Pt verbalized understanding and is agreeable with the plan.   Labs (all labs ordered are listed, but only abnormal results are displayed) Labs Reviewed - No data to display  EKG  EKG  Interpretation None       Radiology No results found.  Procedures Procedures (including critical care time)  Medications Ordered in ED Medications  oxyCODONE-acetaminophen (PERCOCET/ROXICET) 5-325 MG per tablet 1 tablet (1 tablet Oral Given 01/29/17 1500)  oxyCODONE-acetaminophen (PERCOCET/ROXICET) 5-325 MG per tablet (not administered)     Initial Impression / Assessment and Plan / ED Course  I have reviewed the triage vital signs and the nursing notes.  Pertinent labs & imaging results that were available during my care of the patient were reviewed by me and considered in my medical decision making (see chart for details).     Patient with dentalgia.  No abscess requiring immediate incision and drainage.  Exam not concerning for Ludwig's angina or pharyngeal abscess. Continue treatment with doxycycline, tramadol as prescribed by PCP. Patient given 1 dose  of Percocet in the ED per nursing protocol. Pt instructed to follow-up with dentist as soon as possible- given several resources.  Discussed return precautions. Pt safe for discharge.   Final Clinical Impressions(s) / ED Diagnoses   Final diagnoses:  Pain, dental    New Prescriptions Discharge Medication List as of 01/29/2017  3:56 PM     I personally performed the services described in this documentation, which was scribed in my presence. The recorded information has been reviewed and is accurate.     Emi Holes, PA-C 01/29/17 1610    Linwood Dibbles, MD 01/31/17 Rich Fuchs

## 2017-01-29 NOTE — Telephone Encounter (Signed)
Pt still waiting for a call back to find out what to do

## 2017-01-30 ENCOUNTER — Emergency Department (HOSPITAL_COMMUNITY)
Admission: EM | Admit: 2017-01-30 | Discharge: 2017-01-30 | Disposition: A | Discharge status: Home / Self Care | Payer: Self-pay | Attending: Emergency Medicine | Admitting: Emergency Medicine

## 2017-01-30 ENCOUNTER — Encounter (HOSPITAL_COMMUNITY): Payer: Self-pay | Admitting: *Deleted

## 2017-01-30 DIAGNOSIS — K0889 Other specified disorders of teeth and supporting structures: Secondary | ICD-10-CM | POA: Insufficient documentation

## 2017-01-30 DIAGNOSIS — Z5321 Procedure and treatment not carried out due to patient leaving prior to being seen by health care provider: Secondary | ICD-10-CM | POA: Insufficient documentation

## 2017-01-30 HISTORY — DX: Unspecified asthma, uncomplicated: J45.909

## 2017-01-30 LAB — COMPREHENSIVE METABOLIC PANEL
ALBUMIN: 3.5 g/dL (ref 3.5–5.0)
ALK PHOS: 33 U/L — AB (ref 38–126)
ALT: 9 U/L — AB (ref 14–54)
ANION GAP: 8 (ref 5–15)
AST: 13 U/L — ABNORMAL LOW (ref 15–41)
BUN: 6 mg/dL (ref 6–20)
CALCIUM: 8.6 mg/dL — AB (ref 8.9–10.3)
CO2: 22 mmol/L (ref 22–32)
Chloride: 106 mmol/L (ref 101–111)
Creatinine, Ser: 0.66 mg/dL (ref 0.44–1.00)
GFR calc Af Amer: >60 mL/min (ref >60)
GFR calc non Af Amer: >60 mL/min (ref >60)
GLUCOSE: 91 mg/dL (ref 65–99)
Potassium: 3.5 mmol/L (ref 3.5–5.1)
SODIUM: 136 mmol/L (ref 135–145)
Total Bilirubin: 0.7 mg/dL (ref 0.3–1.2)
Total Protein: 6.3 g/dL — ABNORMAL LOW (ref 6.5–8.1)

## 2017-01-30 LAB — CBC WITH DIFFERENTIAL/PLATELET
BASOS PCT: 0 %
Basophils Absolute: 0 10*3/uL (ref 0.0–0.1)
Eosinophils Absolute: 0.1 10*3/uL (ref 0.0–0.7)
Eosinophils Relative: 1 %
HEMATOCRIT: 35.7 % — AB (ref 36.0–46.0)
Hemoglobin: 12.5 g/dL (ref 12.0–15.0)
LYMPHS ABS: 1.8 10*3/uL (ref 0.7–4.0)
Lymphocytes Relative: 21 %
MCH: 30.1 pg (ref 26.0–34.0)
MCHC: 35 g/dL (ref 30.0–36.0)
MCV: 86 fL (ref 78.0–100.0)
MONO ABS: 0.6 10*3/uL (ref 0.1–1.0)
MONOS PCT: 7 %
NEUTROS ABS: 6.1 10*3/uL (ref 1.7–7.7)
Neutrophils Relative %: 71 %
Platelets: 201 10*3/uL (ref 150–400)
RBC: 4.15 MIL/uL (ref 3.87–5.11)
RDW: 12 % (ref 11.5–15.5)
WBC: 8.5 10*3/uL (ref 4.0–10.5)

## 2017-01-30 LAB — I-STAT BETA HCG BLOOD, ED (MC, WL, AP ONLY): I-stat hCG, quantitative: <5 m[IU]/mL (ref <5)

## 2017-01-30 MED ORDER — OXYCODONE-ACETAMINOPHEN 5-325 MG PO TABS
ORAL_TABLET | ORAL | Status: AC
  Filled 2017-01-30: qty 1

## 2017-01-30 MED ORDER — OXYCODONE-ACETAMINOPHEN 5-325 MG PO TABS
1.0000 | ORAL_TABLET | ORAL | Status: DC | PRN
  Administered 2017-01-30: 1 via ORAL

## 2017-01-30 NOTE — ED Notes (Signed)
Pt called multiple times in waiting room for reassessment of vital signs and no response. Pt left post triage,.

## 2017-01-30 NOTE — Telephone Encounter (Signed)
Pt still waiting for a call back to find out what to do with her toothache please advice?

## 2017-01-30 NOTE — Telephone Encounter (Signed)
CMA call regarding patient needed some advice  Patient did not answer but left a detailed  message & to call back at the office if she needs more clarification

## 2017-01-30 NOTE — ED Triage Notes (Signed)
Pt was seen here last night for dental pain and swelling and referred to a dentist.  Over night pt's face swelled even bigger.  When pt went to dentist this am he told her to come back to ED and get IV or IM antibiotics because he felt the infection was increasing.  Pt has been taking antibiotics as prescribed.

## 2017-01-30 NOTE — Telephone Encounter (Signed)
She can increase dosage of Tramadol to 100 mg q8hrs for pain. Continue to take her antibiotics as prescribed. Urgent dental referral was already placed at last office visit. She is continue to have unrelieved pain she needs to go the Emergency Department.

## 2017-01-30 NOTE — ED Notes (Signed)
Called pt to be reassessed with no response.  

## 2017-01-31 ENCOUNTER — Encounter (HOSPITAL_COMMUNITY): Payer: Self-pay | Admitting: Emergency Medicine

## 2017-02-14 ENCOUNTER — Other Ambulatory Visit: Payer: Self-pay | Admitting: Family Medicine

## 2017-02-20 ENCOUNTER — Emergency Department (HOSPITAL_COMMUNITY)
Admission: EM | Admit: 2017-02-20 | Discharge: 2017-02-20 | Disposition: A | Discharge status: Home / Self Care | Payer: No Typology Code available for payment source | Attending: Emergency Medicine | Admitting: Emergency Medicine

## 2017-02-20 ENCOUNTER — Emergency Department (HOSPITAL_COMMUNITY): Payer: No Typology Code available for payment source

## 2017-02-20 ENCOUNTER — Encounter (HOSPITAL_COMMUNITY): Payer: Self-pay

## 2017-02-20 DIAGNOSIS — F17210 Nicotine dependence, cigarettes, uncomplicated: Secondary | ICD-10-CM | POA: Insufficient documentation

## 2017-02-20 DIAGNOSIS — Z880 Allergy status to penicillin: Secondary | ICD-10-CM | POA: Insufficient documentation

## 2017-02-20 DIAGNOSIS — M25572 Pain in left ankle and joints of left foot: Secondary | ICD-10-CM | POA: Insufficient documentation

## 2017-02-20 DIAGNOSIS — M545 Low back pain, unspecified: Secondary | ICD-10-CM

## 2017-02-20 DIAGNOSIS — J45909 Unspecified asthma, uncomplicated: Secondary | ICD-10-CM | POA: Insufficient documentation

## 2017-02-20 MED ORDER — IBUPROFEN 400 MG PO TABS
600.0000 mg | ORAL_TABLET | Freq: Once | ORAL | Status: AC
  Administered 2017-02-20: 21:00:00 600 mg via ORAL
  Filled 2017-02-20: qty 1

## 2017-02-20 NOTE — Discharge Instructions (Signed)
Please read attached information. If you experience any new or worsening signs or symptoms please return to the emergency room for evaluation. Please follow-up with your primary care provider or specialist as discussed.  °

## 2017-02-20 NOTE — ED Notes (Signed)
Pt verbalized understanding discharge instructions and denies any further needs or questions at this time. VS stable, ambulatory and steady gait.   
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
## 2017-02-26 ENCOUNTER — Encounter (HOSPITAL_COMMUNITY): Payer: Self-pay

## 2017-02-26 ENCOUNTER — Emergency Department (HOSPITAL_COMMUNITY)
Admission: EM | Admit: 2017-02-26 | Discharge: 2017-02-27 | Disposition: A | Discharge status: Home / Self Care | Payer: Self-pay | Attending: Emergency Medicine | Admitting: Emergency Medicine

## 2017-02-26 DIAGNOSIS — F17210 Nicotine dependence, cigarettes, uncomplicated: Secondary | ICD-10-CM | POA: Insufficient documentation

## 2017-02-26 DIAGNOSIS — E876 Hypokalemia: Secondary | ICD-10-CM | POA: Insufficient documentation

## 2017-02-26 DIAGNOSIS — J45909 Unspecified asthma, uncomplicated: Secondary | ICD-10-CM | POA: Insufficient documentation

## 2017-02-26 DIAGNOSIS — R112 Nausea with vomiting, unspecified: Secondary | ICD-10-CM | POA: Insufficient documentation

## 2017-02-26 DIAGNOSIS — R197 Diarrhea, unspecified: Secondary | ICD-10-CM | POA: Insufficient documentation

## 2017-02-26 DIAGNOSIS — Z79899 Other long term (current) drug therapy: Secondary | ICD-10-CM | POA: Insufficient documentation

## 2017-02-26 DIAGNOSIS — E860 Dehydration: Secondary | ICD-10-CM | POA: Insufficient documentation
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
## 2017-02-27 LAB — BASIC METABOLIC PANEL
ANION GAP: 10 (ref 5–15)
BUN: 8 mg/dL (ref 6–20)
CALCIUM: 8.9 mg/dL (ref 8.9–10.3)
CHLORIDE: 110 mmol/L (ref 101–111)
CO2: 20 mmol/L — AB (ref 22–32)
Creatinine, Ser: 0.92 mg/dL (ref 0.44–1.00)
GFR calc non Af Amer: >60 mL/min (ref >60)
Glucose, Bld: 114 mg/dL — ABNORMAL HIGH (ref 65–99)
Potassium: 3 mmol/L — ABNORMAL LOW (ref 3.5–5.1)
SODIUM: 140 mmol/L (ref 135–145)

## 2017-02-27 LAB — LIPASE, BLOOD: LIPASE: 19 U/L (ref 11–51)

## 2017-02-27 LAB — I-STAT BETA HCG BLOOD, ED (MC, WL, AP ONLY): I-stat hCG, quantitative: <5 m[IU]/mL (ref <5)

## 2017-02-27 LAB — CBC WITH DIFFERENTIAL/PLATELET
BASOS ABS: 0 10*3/uL (ref 0.0–0.1)
BASOS PCT: 0 %
Eosinophils Absolute: 0 10*3/uL (ref 0.0–0.7)
Eosinophils Relative: 0 %
HCT: 37.2 % (ref 36.0–46.0)
HEMOGLOBIN: 13.3 g/dL (ref 12.0–15.0)
Lymphocytes Relative: 10 %
Lymphs Abs: 1.3 10*3/uL (ref 0.7–4.0)
MCH: 31.1 pg (ref 26.0–34.0)
MCHC: 35.8 g/dL (ref 30.0–36.0)
MCV: 87.1 fL (ref 78.0–100.0)
Monocytes Absolute: 0.7 10*3/uL (ref 0.1–1.0)
Monocytes Relative: 5 %
NEUTROS ABS: 11.4 10*3/uL — AB (ref 1.7–7.7)
NEUTROS PCT: 85 %
Platelets: 204 10*3/uL (ref 150–400)
RBC: 4.27 MIL/uL (ref 3.87–5.11)
RDW: 12.4 % (ref 11.5–15.5)
WBC: 13.4 10*3/uL — ABNORMAL HIGH (ref 4.0–10.5)

## 2017-02-27 LAB — ETHANOL: Alcohol, Ethyl (B): <5 mg/dL (ref <5)

## 2017-02-27 LAB — SALICYLATE LEVEL: Salicylate Lvl: <7 mg/dL (ref 2.8–30.0)

## 2017-02-27 LAB — I-STAT CG4 LACTIC ACID, ED
LACTIC ACID, VENOUS: 3.13 mmol/L — AB (ref 0.5–1.9)
Lactic Acid, Venous: 2.1 mmol/L (ref 0.5–1.9)

## 2017-02-27 MED ORDER — PROCHLORPERAZINE EDISYLATE 5 MG/ML IJ SOLN
10.0000 mg | Freq: Once | INTRAMUSCULAR | Status: AC
  Administered 2017-02-27: 10 mg via INTRAVENOUS
  Filled 2017-02-27: qty 2

## 2017-02-27 MED ORDER — KETOROLAC TROMETHAMINE 15 MG/ML IJ SOLN
15.0000 mg | Freq: Once | INTRAMUSCULAR | Status: AC
  Administered 2017-02-27: 15 mg via INTRAVENOUS
  Filled 2017-02-27: qty 1

## 2017-02-27 MED ORDER — SODIUM CHLORIDE 0.9 % IV BOLUS (SEPSIS)
1000.0000 mL | Freq: Once | INTRAVENOUS | Status: AC
  Administered 2017-02-27: 1000 mL via INTRAVENOUS

## 2017-02-27 MED ORDER — ONDANSETRON HCL 4 MG/2ML IJ SOLN
4.0000 mg | Freq: Once | INTRAMUSCULAR | Status: AC
  Administered 2017-02-27: 4 mg via INTRAVENOUS
  Filled 2017-02-27: qty 2

## 2017-02-27 MED ORDER — POTASSIUM CHLORIDE 10 MEQ/100ML IV SOLN
10.0000 meq | Freq: Once | INTRAVENOUS | Status: AC
  Administered 2017-02-27: 10 meq via INTRAVENOUS
  Filled 2017-02-27: qty 100

## 2017-02-27 NOTE — ED Provider Notes (Signed)
[facility]-EMERGENCY DEPT Provider Note   CSN: [id number] Arrival date & time: 02/26/17  2337     History   Chief Complaint Chief Complaint  Patient presents with  . Abdominal Pain    HPI Leslie Yates is a 26 y.o. female.  The history is provided by the patient.  Abdominal Pain   This is a new problem. The current episode started more than 2 days ago. The problem occurs daily. The problem has been gradually worsening. The pain is located in the generalized abdominal region. The pain is severe. Associated symptoms include diarrhea, nausea and vomiting. Pertinent negatives include fever, hematochezia and dysuria. The symptoms are aggravated by certain positions and palpation. Nothing relieves the symptoms.   Pt reports long h/o episodes of abd pain with nausea/vomiting/diarrhea This episode started 3 days ago No fever No blood in stool/vomiting No cp/sob She was seen at another hospital [facility] for same symptoms and had negative CT scan at that time but symptoms worsened  Past Medical History:  Diagnosis Date  . Asthma   . Asthma   . Obesity   . Ovarian cyst     Patient Active Problem List   Diagnosis Date Noted  . Ovarian cyst, right 11/10/2016  . Nausea with vomiting 11/09/2016  . Cannabinoid hyperemesis syndrome (HCC) 11/09/2016    Past Surgical History:  Procedure Laterality Date  . NO PAST SURGERIES      OB History    No data available       Home Medications    Prior to Admission medications   Medication Sig Start Date End Date Taking? Authorizing Provider  albuterol (PROVENTIL HFA;VENTOLIN HFA) 108 (90 Base) MCG/ACT inhaler Inhale 2 puffs into the lungs every 6 (six) hours as needed for wheezing or shortness of breath. 11/26/16  Yes Hairston, Oren BeckmannMandesia R, FNP  ibuprofen (ADVIL,MOTRIN) 600 MG tablet Take 1 tablet (600 mg total) by mouth every 6 (six) hours as needed for moderate pain (Take with food). 01/28/17  Yes Hairston, Oren BeckmannMandesia R, FNP    naphazoline-glycerin (CLEAR EYES) 0.012-0.2 % SOLN Place 1-2 drops into both eyes 4 (four) times daily as needed for eye irritation.   Yes [provider]  ondansetron (ZOFRAN ODT) 4 MG disintegrating tablet Take 1 tablet (4 mg total) by mouth every 8 (eight) hours as needed for nausea or vomiting. 11/15/16  Yes McClung, Angela M, PA-C  benzocaine (ORAJEL) 10 % mucosal gel Use as directed 1 application in the mouth or throat as needed for mouth pain. Patient not taking: Reported on 02/27/2017 01/28/17   Lizbeth BarkHairston, Mandesia R, FNP  doxycycline (VIBRA-TABS) 100 MG tablet Take 1 tablet (100 mg total) by mouth 2 (two) times daily. Patient not taking: Reported on 02/27/2017 01/28/17   Lizbeth BarkHairston, Mandesia R, FNP  LORazepam (ATIVAN) 1 MG tablet Take 1 tablet (1 mg total) by mouth 3 (three) times daily as needed for anxiety. Patient not taking: Reported on 02/27/2017 11/11/16   Gerhard MunchLockwood, Robert, MD  polyethylene glycol powder [facility](GLYCOLAX/MIRALAX) powder Take 17 g by mouth daily as needed for mild constipation or moderate constipation. Patient not taking: Reported on 02/27/2017 11/26/16   Lizbeth BarkHairston, Mandesia R, FNP  potassium chloride SA (K-DUR,KLOR-CON) 20 MEQ tablet Take 1 tablet (20 mEq total) by mouth daily. 11/15/16 11/20/16  Anders SimmondsMcClung, Angela M, PA-C  promethazine (PHENERGAN) 25 MG suppository Place 1 suppository (25 mg total) rectally every 6 (six) hours as needed for nausea or vomiting. Patient not taking: Reported on 02/27/2017 11/15/16  Georgian Co M, PA-C  senna-docusate (SENOKOT-S) 8.6-50 MG tablet Take 1 tablet by mouth at bedtime as needed for mild constipation or moderate constipation. Patient not taking: Reported on 02/27/2017 11/26/16   Lizbeth Bark, FNP  traMADol (ULTRAM) 50 MG tablet Take 1 tablet (50 mg total) by mouth every 8 (eight) hours as needed for severe pain. Patient not taking: Reported on 02/27/2017 01/28/17   Lizbeth Bark, FNP    Family History Family History  Problem Relation  Age of Onset  . Diabetes Mother   . Hypertension Mother   . Diabetes Father   . Hypertension Father     Social History Social History  Substance Use Topics  . Smoking status: Current Every Day Smoker    Packs/day: 1.00    Years: 11.00    Types: Cigarettes  . Smokeless tobacco: Never Used  . Alcohol use Yes     Comment: occ     Allergies   Penicillins and Penicillins   Review of Systems Review of Systems  Constitutional: Negative for fever.  Gastrointestinal: Positive for abdominal pain, diarrhea, nausea and vomiting. Negative for hematochezia.  Genitourinary: Negative for dysuria and vaginal bleeding.  All other systems reviewed and are negative.    Physical Exam Updated Vital Signs BP (!) 135/124   Pulse (!) 59   Temp 99.4 F (37.4 C) (Oral)   Resp (!) 22   SpO2 100%   Physical Exam  CONSTITUTIONAL: Well developed/well nourished, anxious HEAD: Normocephalic/atraumatic EYES: EOMI/PERRL ENMT: Mucous membranes dry NECK: supple no meningeal signs SPINE/BACK:entire spine nontender CV: S1/S2 noted, no murmurs/rubs/gallops noted LUNGS: Lungs are clear to auscultation bilaterally, no apparent distress ABDOMEN: soft, nontender, no rebound or guarding, bowel sounds noted throughout abdomen GU:no cva tenderness NEURO: Pt is awake/alert/appropriate, moves all extremitiesx4.  No facial droop.   EXTREMITIES: pulses normal/equal, full ROM SKIN: warm, color normal PSYCH: anxious  ED Treatments / Results  Labs (all labs ordered are listed, but only abnormal results are displayed) Labs Reviewed  CBC WITH DIFFERENTIAL/PLATELET - Abnormal; Notable for the following:       Result Value   WBC 13.4 (*)    Neutro Abs 11.4 (*)    All other components within normal limits  BASIC METABOLIC PANEL - Abnormal; Notable for the following:    Potassium 3.0 (*)    CO2 20 (*)    Glucose, Bld 114 (*)    All other components within normal limits  I-STAT CG4 LACTIC ACID, ED -  Abnormal; Notable for the following:    Lactic Acid, Venous 3.13 (*)    All other components within normal limits  I-STAT CG4 LACTIC ACID, ED - Abnormal; Notable for the following:    Lactic Acid, Venous 2.10 (*)    All other components within normal limits  LIPASE, BLOOD  ETHANOL  SALICYLATE LEVEL  I-STAT BETA HCG BLOOD, ED ([facility], [facility], [facility] ONLY)    EKG  EKG Interpretation None       Radiology No results found.  Procedures Procedures    Medications Ordered in ED Medications  sodium chloride 0.9 % bolus 1,000 mL (0 mLs Intravenous Stopped 02/27/17 0120)  sodium chloride 0.9 % bolus 1,000 mL (0 mLs Intravenous Stopped 02/27/17 0551)  sodium chloride 0.9 % bolus 1,000 mL (0 mLs Intravenous Stopped 02/27/17 0551)  ketorolac (TORADOL) 15 MG/ML injection 15 mg (15 mg Intravenous Given 02/27/17 0205)  prochlorperazine (COMPAZINE) injection 10 mg (10 mg Intravenous Given 02/27/17 0205)  potassium chloride 10  mEq in 100 mL IVPB (0 mEq Intravenous Stopped 02/27/17 0319)  ondansetron (ZOFRAN) injection 4 mg (4 mg Intravenous Given 02/27/17 0420)     Initial Impression / Assessment and Plan / ED Course  I have reviewed the triage vital signs and the nursing notes.  Pertinent labs results that were available during my care of the patient were reviewed by me and considered in my medical decision making (see chart for details).     2:31 AM Pt with nausea/vomiting/diarrhea abd exam unremarkable Will give IV fluids and reassess Will need recheck of lactate Will also need GYN f/u previous ovarian masses noted on previous imaging 3:26 AM Pt ambulatory Lactate improved Will try PO challenge 6:08 AM Pt improved She is smiling No distress Drinking a coke Will d/c home She can f/u with PCP She has known ovarian mass, she has seen GYN for this previously and it is being monitored Will d/c home  Final Clinical Impressions(s) / ED Diagnoses   Final diagnoses:  Nausea vomiting and diarrhea    Hypokalemia  Dehydration    New Prescriptions New Prescriptions   No medications on file     Zadie Rhine, MD 02/27/17 [phone]

## 2017-02-27 NOTE — Discharge Instructions (Signed)

## 2017-02-28 ENCOUNTER — Inpatient Hospital Stay: Payer: Self-pay | Admitting: Family Medicine

## 2017-03-15 ENCOUNTER — Inpatient Hospital Stay: Payer: Self-pay

## 2017-04-28 ENCOUNTER — Ambulatory Visit (HOSPITAL_COMMUNITY)
Admission: EM | Admit: 2017-04-28 | Discharge: 2017-04-28 | Disposition: A | Discharge status: Home / Self Care | Payer: Self-pay | Attending: Family Medicine | Admitting: Family Medicine

## 2017-04-28 ENCOUNTER — Encounter (HOSPITAL_COMMUNITY): Payer: Self-pay | Admitting: Emergency Medicine

## 2017-04-28 DIAGNOSIS — L0291 Cutaneous abscess, unspecified: Secondary | ICD-10-CM

## 2017-04-28 MED ORDER — DOXYCYCLINE HYCLATE 100 MG PO TABS: 100.0000 mg | ORAL_TABLET | Freq: Two times a day (BID) | ORAL | 0 refills | Status: DC

## 2017-04-28 MED ORDER — LIDOCAINE-EPINEPHRINE (PF) 2 %-1:200000 IJ SOLN
INTRAMUSCULAR | Status: AC
  Filled 2017-04-28: qty 20
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
## 2017-04-28 NOTE — Discharge Instructions (Signed)
Continue the hot compresses to the right inner thight
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
## 2017-04-29 ENCOUNTER — Telehealth (HOSPITAL_COMMUNITY): Payer: Self-pay | Admitting: Emergency Medicine

## 2017-04-29 MED ORDER — DOXYCYCLINE HYCLATE 100 MG PO TABS: 100.0000 mg | ORAL_TABLET | Freq: Two times a day (BID) | ORAL | 0 refills | Status: DC

## 2017-04-29 NOTE — Telephone Encounter (Signed)
Pt called stating she couldn't afford her antibiotic prescribed yesterday. [facility].

## 2017-05-01 ENCOUNTER — Encounter: Payer: Self-pay | Admitting: Family Medicine

## 2017-05-01 ENCOUNTER — Ambulatory Visit: Payer: Self-pay | Attending: Family Medicine | Admitting: Family Medicine

## 2017-05-01 VITALS — BP 103/66 | HR 77 | Temp 98.6°F | Resp 18 | Ht 73.0 in | Wt 272.8 lb

## 2017-05-01 DIAGNOSIS — M79672 Pain in left foot: Secondary | ICD-10-CM | POA: Insufficient documentation

## 2017-05-01 DIAGNOSIS — Z8742 Personal history of other diseases of the female genital tract: Secondary | ICD-10-CM

## 2017-05-01 DIAGNOSIS — Z79899 Other long term (current) drug therapy: Secondary | ICD-10-CM | POA: Insufficient documentation

## 2017-05-01 DIAGNOSIS — Z87898 Personal history of other specified conditions: Secondary | ICD-10-CM | POA: Insufficient documentation

## 2017-05-01 DIAGNOSIS — Z8719 Personal history of other diseases of the digestive system: Secondary | ICD-10-CM

## 2017-05-01 DIAGNOSIS — R1011 Right upper quadrant pain: Secondary | ICD-10-CM | POA: Insufficient documentation

## 2017-05-01 DIAGNOSIS — K029 Dental caries, unspecified: Secondary | ICD-10-CM | POA: Insufficient documentation

## 2017-05-01 DIAGNOSIS — A084 Viral intestinal infection, unspecified: Secondary | ICD-10-CM | POA: Insufficient documentation

## 2017-05-01 MED ORDER — ONDANSETRON HCL 4 MG PO TABS: 4.0000 mg | ORAL_TABLET | Freq: Three times a day (TID) | ORAL | 0 refills | Status: DC | PRN

## 2017-05-01 MED ORDER — IBUPROFEN 600 MG PO TABS: 600.0000 mg | ORAL_TABLET | Freq: Four times a day (QID) | ORAL | 1 refills | Status: DC | PRN

## 2017-05-01 MED ORDER — LOPERAMIDE HCL 2 MG PO TABS
2.0000 mg | ORAL_TABLET | Freq: Four times a day (QID) | ORAL | 0 refills | Status: DC | PRN
Start: 2017-05-01 — End: 2018-08-19
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
## 2017-05-01 NOTE — Patient Instructions (Signed)

## 2017-05-02 LAB — CMP AND LIVER
ALT: 8 IU/L (ref 0–32)
AST: 14 IU/L (ref 0–40)
Albumin: 3.8 g/dL (ref 3.5–5.5)
Alkaline Phosphatase: 42 IU/L (ref 39–117)
BUN: 12 mg/dL (ref 6–20)
Bilirubin Total: <0.2 mg/dL (ref 0.0–1.2)
Bilirubin, Direct: 0.08 mg/dL (ref 0.00–0.40)
CO2: 25 mmol/L (ref 20–29)
Calcium: 8.6 mg/dL — ABNORMAL LOW (ref 8.7–10.2)
Chloride: 109 mmol/L — ABNORMAL HIGH (ref 96–106)
Creatinine, Ser: 0.69 mg/dL (ref 0.57–1.00)
GFR calc Af Amer: 139 mL/min/{1.73_m2} (ref >59)
GFR calc non Af Amer: 121 mL/min/{1.73_m2} (ref >59)
Glucose: 93 mg/dL (ref 65–99)
Potassium: 4.3 mmol/L (ref 3.5–5.2)
Sodium: 143 mmol/L (ref 134–144)
Total Protein: 6.2 g/dL (ref 6.0–8.5)

## 2017-05-02 LAB — CBC WITH DIFFERENTIAL/PLATELET
Basophils Absolute: 0 10*3/uL (ref 0.0–0.2)
Basos: 0 %
EOS (ABSOLUTE): 0.1 10*3/uL (ref 0.0–0.4)
Eos: 1 %
Hematocrit: 37.2 % (ref 34.0–46.6)
Hemoglobin: 12.6 g/dL (ref 11.1–15.9)
Immature Grans (Abs): 0 10*3/uL (ref 0.0–0.1)
Immature Granulocytes: 0 %
Lymphocytes Absolute: 1.9 10*3/uL (ref 0.7–3.1)
Lymphs: 27 %
MCH: 30.3 pg (ref 26.6–33.0)
MCHC: 33.9 g/dL (ref 31.5–35.7)
MCV: 89 fL (ref 79–97)
Monocytes Absolute: 0.3 10*3/uL (ref 0.1–0.9)
Monocytes: 4 %
Neutrophils Absolute: 4.7 10*3/uL (ref 1.4–7.0)
Neutrophils: 68 %
Platelets: 270 10*3/uL (ref 150–379)
RBC: 4.16 x10E6/uL (ref 3.77–5.28)
RDW: 13.1 % (ref 12.3–15.4)
WBC: 7 10*3/uL (ref 3.4–10.8)

## 2017-05-02 LAB — LIPASE: LIPASE: 19 U/L (ref 14–72)

## 2017-05-07 ENCOUNTER — Other Ambulatory Visit: Payer: Self-pay

## 2017-05-07 ENCOUNTER — Ambulatory Visit (HOSPITAL_COMMUNITY)
Admission: RE | Admit: 2017-05-07 | Discharge: 2017-05-07 | Disposition: A | Discharge status: Home / Self Care | Payer: Self-pay | Source: Ambulatory Visit | Attending: Family Medicine | Admitting: Family Medicine

## 2017-05-07 ENCOUNTER — Inpatient Hospital Stay (HOSPITAL_COMMUNITY)
Admission: AD | Admit: 2017-05-07 | Discharge: 2017-05-07 | Disposition: A | Discharge status: Home / Self Care | Payer: Self-pay | Source: Ambulatory Visit | Attending: Family Medicine | Admitting: Family Medicine

## 2017-05-07 ENCOUNTER — Encounter (HOSPITAL_COMMUNITY): Payer: Self-pay | Admitting: *Deleted

## 2017-05-07 DIAGNOSIS — N764 Abscess of vulva: Secondary | ICD-10-CM | POA: Insufficient documentation

## 2017-05-07 DIAGNOSIS — Z8742 Personal history of other diseases of the female genital tract: Secondary | ICD-10-CM

## 2017-05-07 DIAGNOSIS — L0291 Cutaneous abscess, unspecified: Secondary | ICD-10-CM

## 2017-05-07 DIAGNOSIS — N838 Other noninflammatory disorders of ovary, fallopian tube and broad ligament: Secondary | ICD-10-CM | POA: Insufficient documentation

## 2017-05-07 DIAGNOSIS — Z880 Allergy status to penicillin: Secondary | ICD-10-CM | POA: Insufficient documentation

## 2017-05-07 DIAGNOSIS — F17210 Nicotine dependence, cigarettes, uncomplicated: Secondary | ICD-10-CM | POA: Insufficient documentation

## 2017-05-07 HISTORY — DX: Sickle-cell trait: D57.3

## 2017-05-07 LAB — URINALYSIS, ROUTINE W REFLEX MICROSCOPIC
Bilirubin Urine: NEGATIVE
GLUCOSE, UA: NEGATIVE mg/dL
KETONES UR: NEGATIVE mg/dL
LEUKOCYTES UA: NEGATIVE
NITRITE: NEGATIVE
PH: 6 (ref 5.0–8.0)
Protein, ur: NEGATIVE mg/dL
Specific Gravity, Urine: 1.01 (ref 1.005–1.030)

## 2017-05-07 LAB — POCT PREGNANCY, URINE: Preg Test, Ur: NEGATIVE

## 2017-05-07 MED ORDER — SULFAMETHOXAZOLE-TRIMETHOPRIM 800-160 MG PO TABS: 1.0000 | ORAL_TABLET | Freq: Two times a day (BID) | ORAL | 0 refills | Status: DC

## 2017-05-07 MED ORDER — TRAMADOL HCL 50 MG PO TABS: 50.0000 mg | ORAL_TABLET | Freq: Four times a day (QID) | ORAL | 0 refills | Status: DC | PRN

## 2017-05-07 MED ORDER — SULFAMETHOXAZOLE-TRIMETHOPRIM 800-160 MG PO TABS: 1.0000 | ORAL_TABLET | Freq: Two times a day (BID) | ORAL | 0 refills | Status: AC

## 2017-05-07 NOTE — MAU Provider Note (Signed)
Chief Complaint: Abscess   SUBJECTIVE HPI: Leslie Yates is a 26 y.o. G0P0000 who presents to MAU with complaints about groin abscess.  First noticed abscess on inner right thigh a few days ago.  Patient states that the abscess started couple days after she started her antibiotics for her prior abscess.  Area seems like it is worsening.  She is still taking her doxycycline as instructed.  She denies any fevers.  Area is severely tender to palpation.  Has not experienced any draining from the abscess.  Patient states that she gets these abscesses often.  She does shave. Has some surrounding redness.    Past Medical History:  Diagnosis Date  . Asthma   . Asthma   . Obesity   . Ovarian cyst   . Sickle cell trait (HCC)    OB History  Gravida Para Term Preterm AB Living  0 0 0 0 0 0  SAB TAB Ectopic Multiple Live Births  0 0 0 0 0       Past Surgical History:  Procedure Laterality Date  . INCISION AND DRAINAGE ABSCESS     Social History   Socioeconomic History  . Marital status: Single    Spouse name: Not on file  . Number of children: Not on file  . Years of education: Not on file  . Highest education level: Not on file  Social Needs  . Financial resource strain: Not on file  . Food insecurity - worry: Not on file  . Food insecurity - inability: Not on file  . Transportation needs - medical: Not on file  . Transportation needs - non-medical: Not on file  Occupational History  . Not on file  Tobacco Use  . Smoking status: Current Every Day Smoker    Packs/day: 1.00    Years: 11.00    Pack years: 11.00    Types: Cigarettes  . Smokeless tobacco: Never Used  Substance and Sexual Activity  . Alcohol use: Yes    Comment: occ  . Drug use: No    Comment: Last used: Last Thursday  . Sexual activity: Yes    Birth control/protection: None  Other Topics Concern  . Not on file  Social History Narrative   ** Merged History Encounter **       No current  facility-administered medications on file prior to encounter.    Current Outpatient Medications on File Prior to Encounter  Medication Sig Dispense Refill  . albuterol (PROVENTIL HFA;VENTOLIN HFA) 108 (90 Base) MCG/ACT inhaler Inhale 2 puffs into the lungs every 6 (six) hours as needed for wheezing or shortness of breath. 1 Inhaler 2  . doxycycline (VIBRA-TABS) 100 MG tablet Take 1 tablet (100 mg total) 2 (two) times daily by mouth. 20 tablet 0  . ibuprofen (ADVIL,MOTRIN) 600 MG tablet Take 1 tablet (600 mg total) every 6 (six) hours as needed by mouth for moderate pain (Take with food.). 30 tablet 1  . loperamide (IMODIUM A-D) 2 MG tablet Take 1 tablet (2 mg total) 4 (four) times daily as needed by mouth for diarrhea or loose stools. 30 tablet 0  . naphazoline-glycerin (CLEAR EYES) 0.012-0.2 % SOLN Place 1-2 drops into both eyes 4 (four) times daily as needed for eye irritation.    . ondansetron (ZOFRAN ODT) 4 MG disintegrating tablet Take 1 tablet (4 mg total) by mouth every 8 (eight) hours as needed for nausea or vomiting. 40 tablet 0  . ondansetron (ZOFRAN) 4 MG tablet Take 1  tablet (4 mg total) every 8 (eight) hours as needed by mouth for nausea or vomiting. 20 tablet 0   Allergies  Allergen Reactions  . Penicillins   . Penicillins Rash    Childhood rash Has patient had a PCN reaction causing immediate rash, facial/tongue/throat swelling, SOB or lightheadedness with hypotension: no Has patient had a PCN reaction causing severe rash involving mucus membranes or skin necrosis:no Has patient had a PCN reaction that required hospitalization no Has patient had a PCN reaction occurring within the last 10 years: no If all of the above answers are "NO", then may proceed with Cephalosporin use.     I have reviewed the past Medical Hx, Surgical Hx, Social Hx, Allergies and Medications.   REVIEW OF SYSTEMS All systems reviewed and are negative for acute change except as noted in the  HPI.   OBJECTIVE BP 117/68 (BP Location: Right Arm)   Pulse 61   Temp 98.3 F (36.8 C) (Oral)   Resp 18   Wt 123 kg (271 lb 4 oz)   LMP 04/26/2017   SpO2 98%   BMI 35.79 kg/m    PHYSICAL EXAM Constitutional: Well-developed, well-nourished female in no acute distress.  Cardiovascular: normal rate and rhythm, pulses intact Respiratory: normal rate and effort GI: Abd soft, non-tender, non-distended.  MS: Extremities nontender, no edema, normal ROM Neurologic: Alert and oriented x 4. No focal deficits GU: right labia with abscess measuring 4x2cm, tender to palpation, extension appreciated toward mons, mils surrounding erythema  Psych: normal mood and affect  LAB RESULTS Results for orders placed or performed during the hospital encounter of 05/07/17 (from the past 24 hour(s))  Urinalysis, Routine w reflex microscopic     Status: Abnormal   Collection Time: 05/07/17  5:11 PM  Result Value Ref Range   Color, Urine YELLOW YELLOW   APPearance CLEAR CLEAR   Specific Gravity, Urine 1.010 1.005 - 1.030   pH 6.0 5.0 - 8.0   Glucose, UA NEGATIVE NEGATIVE mg/dL   Hgb urine dipstick SMALL (A) NEGATIVE   Bilirubin Urine NEGATIVE NEGATIVE   Ketones, ur NEGATIVE NEGATIVE mg/dL   Protein, ur NEGATIVE NEGATIVE mg/dL   Nitrite NEGATIVE NEGATIVE   Leukocytes, UA NEGATIVE NEGATIVE   RBC / HPF 0-5 0 - 5 RBC/hpf   WBC, UA 0-5 0 - 5 WBC/hpf   Bacteria, UA RARE (A) NONE SEEN   Squamous Epithelial / LPF 0-5 (A) NONE SEEN   Mucus PRESENT   Pregnancy, urine POC     Status: None   Collection Time: 05/07/17  5:48 PM  Result Value Ref Range   Preg Test, Ur NEGATIVE NEGATIVE    IMAGING No results found.  MAU COURSE Vitals and nursing notes reviewed  Treatments given in MAU:  Incision and Drainage Procedure Note Indications: Right labial abscess  Anesthesia: 1% plain lidocaine  Procedure Details  The procedure, risks and complications have been discussed in detail (including, but not  limited to airway compromise, infection, bleeding) with the patient, and the patient has signed consent to the procedure.  The skin was sterilely prepped and draped over the affected area in the usual fashion. After adequate local anesthesia, I&D with a #11 blade was performed. Purulent drainage: present with odor. Cultures sent of wound. The patient was observed until stable. Minimal blood loss.  Condition: Tolerated procedure well  MDM Plan of care reviewed with patient, including labs and tests ordered and medical treatment.   ASSESSMENT 1. Abscess  PLAN Discharge home in stable condition Rx switched to Bactrim DS Tramadol for pain Cultures pending Follow-up with PCP for recheck this week Counseled on return precautions Handout given   Caryl Ada, DO OB [facility], [facility] 05/07/2017, 5:38 PM

## 2017-05-07 NOTE — Discharge Instructions (Signed)
Hibaclens solution to clean skin and prevent infections Discuss with your doctor the below diagnosis   Hidradenitis Suppurativa Hidradenitis suppurativa is a long-term (chronic) skin disease that starts with blocked sweat glands or hair follicles. Bacteria may grow in these blocked openings of your skin. Hidradenitis suppurativa is like a severe form of acne that develops in areas of your body where acne would be unusual. It is most likely to affect the areas of your body where skin rubs against skin and becomes moist. This includes your:  Underarms.  Groin.  Genital areas.  Buttocks.  Upper thighs.  Breasts.  Hidradenitis suppurativa may start out with small pimples. The pimples can develop into deep sores that break open (rupture) and drain pus. Over time your skin may thicken and become scarred. Hidradenitis suppurativa cannot be passed from person to person. What are the causes? The exact cause of hidradenitis suppurativa is not known. This condition may be due to:  Female and female hormones. The condition is rare before and after puberty.  An overactive body defense system (immune system). Your immune system may overreact to the blocked hair follicles or sweat glands and cause swelling and pus-filled sores.  What increases the risk? You may have a higher risk of hidradenitis suppurativa if you:  Are a woman.  Are between ages 4111 and 1855.  Have a family history of hidradenitis suppurativa.  Have a personal history of acne.  Are overweight.  Smoke.  Take the drug lithium.  What are the signs or symptoms? The first signs of an outbreak are usually painful skin bumps that look like pimples. As the condition progresses:  Skin bumps may get bigger and grow deeper into the skin.  Bumps under the skin may rupture and drain smelly pus.  Skin may become itchy and infected.  Skin may thicken and scar.  Drainage may continue through tunnels under the skin  (fistulas).  Walking and moving your arms can become painful.  How is this diagnosed? Your health care provider may diagnose hidradenitis suppurativa based on your medical history and your signs and symptoms. A physical exam will also be done. You may need to see a health care provider who specializes in skin diseases (dermatologist). You may also have tests done to confirm the diagnosis. These can include:  Swabbing a sample of pus or drainage from your skin so it can be sent to the lab and tested for infection.  Blood tests to check for infection.  How is this treated? The same treatment will not work for everybody with hidradenitis suppurativa. Your treatment will depend on how severe your symptoms are. You may need to try several treatments to find what works best for you. Part of your treatment may include cleaning and bandaging (dressing) your wounds. You may also have to take medicines, such as the following:  Antibiotics.  Acne medicines.  Medicines to block or suppress the immune system.  A diabetes medicine (metformin) is sometimes used to treat this condition.  For women, birth control pills can sometimes help relieve symptoms.  You may need surgery if you have a severe case of hidradenitis suppurativa that does not respond to medicine. Surgery may involve:  Using a laser to clear the skin and remove hair follicles.  Opening and draining deep sores.  Removing the areas of skin that are diseased and scarred.  Follow these instructions at home:  Learn as much as you can about your disease, and work closely with your health care  providers.  Take medicines only as directed by your health care provider.  If you were prescribed an antibiotic medicine, finish it all even if you start to feel better.  If you are overweight, losing weight may be very helpful. Try to reach and maintain a healthy weight.  Do not use any tobacco products, including cigarettes, chewing  tobacco, or electronic cigarettes. If you need help quitting, ask your health care provider.  Do not shave the areas where you get hidradenitis suppurativa.  Do not wear deodorant.  Wear loose-fitting clothes.  Try not to overheat and get sweaty.  Take a daily bleach bath as directed by your health care provider. ? Fill your bathtub halfway with water. ? Pour in  cup of unscented household bleach. ? Soak for 5-10 minutes.  Cover sore areas with a warm, clean washcloth (compress) for 5-10 minutes. Contact a health care provider if:  You have a flare-up of hidradenitis suppurativa.  You have chills or a fever.  You are having trouble controlling your symptoms at home. This information is not intended to replace advice given to you by your health care provider. Make sure you discuss any questions you have with your health care provider. Document Released: 01/24/2004 Document Revised: 11/17/2015 Document Reviewed: 09/11/2013 Elsevier Interactive Patient Education  2018 [employer]

## 2017-05-07 NOTE — MAU Note (Signed)
Went in for US, was getting nauseated.  Thinks it was from the abscess in her groin.  Was not able to do vag US due to abscess obstructing. First noted about 3 days ago. Had one lanced on 11/4, is on antibiotics.

## 2017-05-09 ENCOUNTER — Ambulatory Visit (INDEPENDENT_AMBULATORY_CARE_PROVIDER_SITE_OTHER): Payer: Self-pay | Admitting: Orthopaedic Surgery

## 2017-05-10 ENCOUNTER — Telehealth: Payer: Self-pay

## 2017-05-10 ENCOUNTER — Other Ambulatory Visit: Payer: Self-pay | Admitting: Family Medicine

## 2017-05-10 DIAGNOSIS — N83291 Other ovarian cyst, right side: Secondary | ICD-10-CM | POA: Insufficient documentation

## 2017-05-10 NOTE — Telephone Encounter (Signed)
-----   Message from Lizbeth BarkMandesia R Hairston, FNP sent at 05/10/2017  8:35 AM EST ----- Ultrasounds showed right sided mildly complex ovarian cyst.  You will be referred to gynecology. Apply for the orange card.  Pelvic MRI recommended.

## 2017-05-10 NOTE — Telephone Encounter (Signed)
CMA call regarding lab results   Patient verify DOB   Patient was aware and understood result & she also was aware of her MRI appt  coming up on 11/30 @ [facility] @ 4 pm

## 2017-05-23 ENCOUNTER — Ambulatory Visit: Payer: Self-pay | Attending: Family Medicine

## 2017-05-24 ENCOUNTER — Ambulatory Visit (HOSPITAL_COMMUNITY)
Admission: RE | Admit: 2017-05-24 | Discharge: 2017-05-24 | Disposition: A | Discharge status: Home / Self Care | Payer: Self-pay | Source: Ambulatory Visit | Attending: Family Medicine | Admitting: Family Medicine

## 2017-05-24 DIAGNOSIS — N83291 Other ovarian cyst, right side: Secondary | ICD-10-CM | POA: Insufficient documentation

## 2017-05-24 MED ORDER — GADOBENATE DIMEGLUMINE 529 MG/ML IV SOLN: 20.0000 mL | Freq: Once | INTRAVENOUS | Status: DC | PRN

## 2017-05-31 ENCOUNTER — Telehealth: Payer: Self-pay | Admitting: Family Medicine

## 2017-05-31 ENCOUNTER — Other Ambulatory Visit: Payer: Self-pay | Admitting: Family Medicine

## 2017-05-31 DIAGNOSIS — M799 Soft tissue disorder, unspecified: Secondary | ICD-10-CM

## 2017-05-31 DIAGNOSIS — M7989 Other specified soft tissue disorders: Secondary | ICD-10-CM

## 2017-05-31 DIAGNOSIS — K668 Other specified disorders of peritoneum: Secondary | ICD-10-CM

## 2017-05-31 NOTE — Telephone Encounter (Signed)
Called and spoke with patient regarding MRI imaging results. Urgent referral to gynecology will be placed for further workup and evaluation. She communicates understanding and is agreeable to plan.

## 2017-06-11 MED FILL — IBUPROFEN 600 MG TABLET: 600 | 7 days supply | Qty: 30 | Fill #0

## 2017-06-11 MED FILL — ?ONDANSETRON HCL 4MG TABLET: 4 | 5 days supply | Qty: 20 | Fill #0

## 2017-06-13 ENCOUNTER — Encounter: Payer: Self-pay | Admitting: Obstetrics and Gynecology

## 2017-06-13 ENCOUNTER — Ambulatory Visit (INDEPENDENT_AMBULATORY_CARE_PROVIDER_SITE_OTHER): Payer: Self-pay | Admitting: Obstetrics and Gynecology

## 2017-06-13 ENCOUNTER — Ambulatory Visit (INDEPENDENT_AMBULATORY_CARE_PROVIDER_SITE_OTHER): Payer: Self-pay | Admitting: Clinical

## 2017-06-13 VITALS — BP 124/66 | HR 66 | Ht 73.0 in | Wt 273.1 lb

## 2017-06-13 DIAGNOSIS — Z113 Encounter for screening for infections with a predominantly sexual mode of transmission: Secondary | ICD-10-CM

## 2017-06-13 DIAGNOSIS — F4323 Adjustment disorder with mixed anxiety and depressed mood: Secondary | ICD-10-CM

## 2017-06-13 DIAGNOSIS — N760 Acute vaginitis: Secondary | ICD-10-CM

## 2017-06-13 DIAGNOSIS — F329 Major depressive disorder, single episode, unspecified: Secondary | ICD-10-CM

## 2017-06-13 DIAGNOSIS — N83299 Other ovarian cyst, unspecified side: Secondary | ICD-10-CM

## 2017-06-13 DIAGNOSIS — F32A Depression, unspecified: Secondary | ICD-10-CM

## 2017-06-13 MED ORDER — ZOLPIDEM TARTRATE 5 MG PO TABS: 5.0000 mg | ORAL_TABLET | Freq: Every evening | ORAL | 1 refills | Status: DC | PRN

## 2017-06-13 MED ORDER — NORGESTIMATE-ETH ESTRADIOL 0.25-35 MG-MCG PO TABS: 1.0000 | ORAL_TABLET | Freq: Every day | ORAL | 11 refills | Status: DC

## 2017-06-13 NOTE — Progress Notes (Signed)
26 yo G0 referred for the evaluation of a persistent right ovarian cyst. Patient reports feeling well without some occasional right lower abdominal pain. She reports good relief with ibuprofen. She is sexually active without contraception. She is not trying to conceive. She reports a monthly period of 5 days. She reports the presence of white discharge without occasional odor. She desires STD testing. Patient also complaining of insomnia. She admits to being stressed at work as she is currently applying for a promotion.  Past Medical History:  Diagnosis Date  . Asthma   . Asthma   . Obesity   . Ovarian cyst   . Sickle cell trait [facility](HCC)    Past Surgical History:  Procedure Laterality Date  . INCISION AND DRAINAGE ABSCESS     Family History  Problem Relation Age of Onset  . Diabetes Mother   . Hypertension Mother   . Diabetes Father   . Hypertension Father    Social History   Tobacco Use  . Smoking status: Current Every Day Smoker    Packs/day: 1.00    Years: 11.00    Pack years: 11.00    Types: Cigarettes  . Smokeless tobacco: Never Used  Substance Use Topics  . Alcohol use: Yes    Comment: occ  . Drug use: No    Comment: Last used: Last Thursday   ROS See pertinent in HPI  GENERAL: Well-developed, well-nourished female in no acute distress.  ABDOMEN: Soft, nontender, nondistended. No organomegaly. PELVIC: Normal external female genitalia. Vagina is pink and rugated.  Normal discharge. Normal appearing cervix. Uterus is normal in size. Fullness and mild tenderness in right adnexa EXTREMITIES: No cyanosis, clubbing, or edema, 2+ distal pulses.  05/24/2017 MRI  FINDINGS: Urinary Tract: No urinary bladder or urethral abnormality.  Bowel: Unremarkable pelvic bowel loops.  Vascular/Lymphatic: Unremarkable. No pathologically enlarged pelvic lymph nodes identified.  Reproductive:  -- Uterus: Measures 8.8 x 2.9 by 5.6 cm (volume = 75 cm^3). No fibroids or other  masses identified. Unremarkable cervix and vagina.  -- Right ovary: Cystic lesion again seen which appears to arise in the right adnexa measuring 10.6 by 6.0 by 8.8 cm, which shows T1 and T2 hypointensity. No definite T2 shading. This lesion shows a small mural cyst along its right lateral wall, a thin internal septation, and mild peripheral wall thickening, which show contrast enhancement. No solid component or internal fat identified. This lesion shows no significant change compared to previous studies dating back to 03/01/2016.  -- Left ovary: Appears normal. No mass or inflammatory process identified.  Other: No peritoneal masses or abnormal free fluid.  Musculoskeletal:  Unremarkable.  IMPRESSION: 11 cm cystic lesion arising from right adnexa which has indeterminate but probably benign characteristics. This has not significantly changed in size compared to previous studies dating back to 03/01/2016. Differential considerations include benign cystic ovarian neoplasm, endometrioma, and peritoneal inclusion cyst. Recommend correlation with tumor markers, and consider surgical evaluation.   Electronically Signed   By: Myles RosenthalJohn  Stahl M.D.   On: 05/24/2017 17:51  A/P 26 yo with a persistent complex right ovarian cyst - Ca- 125 ordered - Discussed surgical management with laparoscopic right oophorectomy. Risks, benefits and alternatives were explained including but not limited to risks of bleeding, infection and damage to adjacent organs. Patient verbalized understanding and all questions were answered. Patient will be scheduled for surgery in March pending her work schedule - Discussed contraception options with the patient and she is interested in restarting COC. Rx Sprintec  provided - Rx Remus Lofflerambien given to help with insomnia - RTC prn - Patient referred to cervical cancer screening sessions for annual pap smear

## 2017-06-13 NOTE — BH Specialist Note (Signed)
Integrated Behavioral Health Initial Visit  MRN: [id number] Name: Leslie Cockingkeiba J Bley  Number of Integrated Behavioral Health Clinician visits:: 1/6 Session Start time: 4:10  Session End time: 4:40 Total time: 30 minutes  Type of Service: Integrated Behavioral Health- Individual/Family Interpretor:No. Interpretor Name and Language: n/a   Warm Hand Off Completed.       SUBJECTIVE: Leslie Yates is a 26 y.o. female accompanied by n/a Patient was referred by Dr Jolayne Pantheronstant for anxiety and depression. Patient reports the following symptoms/concerns: Pt states her primary concern today is an increase in interpersonal stress with her partner and family,and work stress, and worry over possible removal of an ovary that is causing her to feel depressed and anxious; open to learning self-coping strategy. Duration of problem: Increase in past month; Severity of problem: moderate  OBJECTIVE: Mood: Anxious and Depressed and Affect: Tearful Risk of harm to self or others: No plan to harm self or others  LIFE CONTEXT: Family and Social: Lives with partner School/Work: Working Economistfulltime Self-Care: Used to attend therapy; tries to keep a positive outlook Life Changes: Changing health  GOALS ADDRESSED: Patient will: 1. Reduce symptoms of: anxiety, depression and stress 2. Increase knowledge and/or ability of: self-management skills  3. Demonstrate ability to: Increase healthy adjustment to current life circumstances  INTERVENTIONS: Interventions utilized: Mindfulness or Management consultantelaxation Training and Psychoeducation and/or Health Education  Standardized Assessments completed: GAD-7 and PHQ 9  ASSESSMENT: Patient currently experiencing Adjustment disorder with mixed anxious and depressed mood.   Patient may benefit from psychoeducation and brief therapeutic interventions regarding coping with symptoms of anxiety and depression. Marland Kitchen.  PLAN: 1. Follow up with behavioral health clinician on : Next medical  appointment 2. Behavioral recommendations:  -CALM relaxation breathing exercise within one hour of waking, daily, and prior to sleep -Read educational materials regarding coping with symptoms of anxiety and depression  3. Referral(s): Integrated Behavioral Health Services (In Clinic) 4. "From scale of 1-10, how likely are you to follow plan?": 9  Rae LipsJamie C Elspeth Blucher, LCSW  Depression screen [facility] 2/9 06/13/2017 05/01/2017 01/28/2017 11/26/2016 11/26/2016  Decreased Interest 0 0 2 0 0  Down, Depressed, Hopeless 1 0 0 0 1  PHQ - 2 Score 1 0 2 0 1  Altered sleeping 3 0 3 0 1  Tired, decreased energy 3 2 1 2 1   Change in appetite 3 3 0 3 1  Feeling bad or failure about yourself  0 0 0 0 0  Trouble concentrating 1 0 0 1 0  Moving slowly or fidgety/restless 2 0 0 0 0  Suicidal thoughts 0 0 0 0 0  PHQ-9 Score 13 5 6 6 4    GAD 7 : Generalized Anxiety Score 06/13/2017 05/01/2017 01/28/2017 11/26/2016  Nervous, Anxious, on Edge 0 0 0 0  Control/stop worrying 1 0 0 0  Worry too much - different things 1 0 0 0  Trouble relaxing 3 1 3 1   Restless 1 0 0 0  Easily annoyed or irritable 3 1 1 2   Afraid - awful might happen 0 0 0 0  Total GAD 7 Score 9 2 4  3

## 2017-06-13 NOTE — BH Specialist Note (Signed)
error 

## 2017-06-14 LAB — CERVICOVAGINAL ANCILLARY ONLY
Bacterial vaginitis: POSITIVE — AB
CANDIDA VAGINITIS: NEGATIVE
CHLAMYDIA, DNA PROBE: NEGATIVE
Neisseria Gonorrhea: NEGATIVE
Trichomonas: NEGATIVE

## 2017-06-14 LAB — CA 125: CANCER ANTIGEN (CA) 125: 5.9 U/mL (ref 0.0–38.1)

## 2017-06-15 MED ORDER — METRONIDAZOLE 500 MG PO TABS: 500.0000 mg | ORAL_TABLET | Freq: Two times a day (BID) | ORAL | 0 refills | Status: DC

## 2017-06-19 ENCOUNTER — Telehealth: Payer: Self-pay | Admitting: General Practice

## 2017-06-19 NOTE — Telephone Encounter (Signed)
Called and informed patient of results & medication sent to pharmacy. Patient verbalized understanding and had no questions

## 2017-06-19 NOTE — Telephone Encounter (Signed)
-----   Message from Catalina AntiguaPeggy Constant, MD sent at 06/15/2017 11:47 AM EST ----- Please inform patient of BV infection. Rx Flagyl has been e-prescribed to [facility] on [address]

## 2017-07-01 ENCOUNTER — Encounter (HOSPITAL_COMMUNITY): Payer: Self-pay

## 2017-07-16 ENCOUNTER — Ambulatory Visit: Payer: Self-pay

## 2017-07-16 NOTE — BH Specialist Note (Deleted)
Integrated Behavioral Health Follow Up Visit  MRN: [id number] Name: Leslie Yates  Number of Integrated Behavioral Health Clinician visits: 2/6 Session Start time: ***  Session End time: *** Total time: {IBH Total Time:21014050}  Type of Service: Integrated Behavioral Health- Individual/Family Interpretor:No. Interpretor Name and Language: n/a  SUBJECTIVE: Leslie Yates is a 27 y.o. female accompanied by {Patient accompanied by:[phone]} Patient was referred by Dr Jolayne Pantheronstant  for anxiety and depression. Patient reports the following symptoms/concerns: *** Duration of problem: ***; Severity of problem: {Mild/Moderate/Severe:20260}  OBJECTIVE: Mood: {BHH MOOD:22306} and Affect: {BHH AFFECT:22307} Risk of harm to self or others: No plan to harm self or others  LIFE CONTEXT: Family and Social: Lives with partner *** School/Work: Working fulltime *** Self-Care: Positive outlook, attended therapy in the past *** Life Changes: Changing health ***  GOALS ADDRESSED: Patient will: 1.  Reduce symptoms of: anxiety, depression and stress  2.  Increase knowledge and/or ability of: {IBH Patient Tools:21014057}  3.  Demonstrate ability to: {IBH Goals:21014053}  INTERVENTIONS: Interventions utilized:  {IBH Interventions:21014054} Standardized Assessments completed: GAD-7 and PHQ 9  ASSESSMENT: Patient currently experiencing ***.   Patient may benefit from ***.  PLAN: 1. Follow up with behavioral health clinician on : *** 2. Behavioral recommendations:  -*** (CALM?) -*** 3. Referral(s): {IBH Referrals:21014055} 4. "From scale of 1-10, how likely are you to follow plan?": ***  Valetta CloseJamie C McMannes, LCSW

## 2017-09-17 ENCOUNTER — Ambulatory Visit: Admit: 2017-09-17 | Payer: Self-pay | Admitting: Obstetrics and Gynecology

## 2017-09-17 SURGERY — OOPHORECTOMY, LAPAROSCOPIC
Anesthesia: Choice | Laterality: Right

## 2017-10-25 ENCOUNTER — Encounter (HOSPITAL_COMMUNITY): Payer: Self-pay | Admitting: *Deleted

## 2017-10-25 ENCOUNTER — Emergency Department (HOSPITAL_COMMUNITY)
Admission: EM | Admit: 2017-10-25 | Discharge: 2017-10-25 | Disposition: A | Discharge status: Home / Self Care | Payer: Self-pay | Attending: Emergency Medicine | Admitting: Emergency Medicine

## 2017-10-25 ENCOUNTER — Other Ambulatory Visit: Payer: Self-pay

## 2017-10-25 DIAGNOSIS — K029 Dental caries, unspecified: Secondary | ICD-10-CM | POA: Insufficient documentation

## 2017-10-25 DIAGNOSIS — Z79899 Other long term (current) drug therapy: Secondary | ICD-10-CM | POA: Insufficient documentation

## 2017-10-25 DIAGNOSIS — J45909 Unspecified asthma, uncomplicated: Secondary | ICD-10-CM | POA: Insufficient documentation

## 2017-10-25 DIAGNOSIS — F17210 Nicotine dependence, cigarettes, uncomplicated: Secondary | ICD-10-CM | POA: Insufficient documentation

## 2017-10-25 DIAGNOSIS — Z8742 Personal history of other diseases of the female genital tract: Secondary | ICD-10-CM

## 2017-10-25 DIAGNOSIS — M79672 Pain in left foot: Secondary | ICD-10-CM

## 2017-10-25 MED ORDER — IBUPROFEN 600 MG PO TABS: 600.0000 mg | ORAL_TABLET | Freq: Four times a day (QID) | ORAL | 0 refills | Status: DC | PRN

## 2017-10-25 MED ORDER — CLINDAMYCIN HCL 150 MG PO CAPS: 150.0000 mg | ORAL_CAPSULE | Freq: Four times a day (QID) | ORAL | 0 refills | Status: DC

## 2017-10-25 MED ORDER — BUPIVACAINE-EPINEPHRINE (PF) 0.5% -1:200000 IJ SOLN: 1.8000 mL | Freq: Once | INTRAMUSCULAR | Status: DC

## 2017-10-25 NOTE — Discharge Instructions (Signed)
Please call and follow up with a dentist for further management of your dental pain.

## 2017-10-25 NOTE — ED Provider Notes (Signed)
[facility] EMERGENCY DEPARTMENT Provider Note   CSN: [id number] Arrival date & time: 10/25/17  1191     History   Chief Complaint Chief Complaint  Patient presents with  . Dental Pain    HPI Leslie Yates is a 27 y.o. female.  HPI   27 year old female presenting complaining of dental pain.  Patient report for the past year she has had intermittent pain involving her left upper tooth.  Pain is described as a throbbing achy sensation, moderate to severe currently and has been ongoing for the past 3 days.  Pain worse with chewing, talking eating.  Pain not adequately controlled with over-the-counter medication such as Orajel, ibuprofen, and clove.  No associated fever, hearing changes, trouble swallowing, neck pain or rash.  She has been trying to find a dentist but none accept Medicaid.  Past Medical History:  Diagnosis Date  . Asthma   . Asthma   . Obesity   . Ovarian cyst   . Sickle cell trait [facility])     Patient Active Problem List   Diagnosis Date Noted  . Complex cyst of right ovary 05/10/2017  . Ovarian cyst, right 11/10/2016  . Nausea with vomiting 11/09/2016  . Cannabinoid hyperemesis syndrome (HCC) 11/09/2016    Past Surgical History:  Procedure Laterality Date  . INCISION AND DRAINAGE ABSCESS       OB History    Gravida  0   Para  0   Term  0   Preterm  0   AB  0   Living  0     SAB  0   TAB  0   Ectopic  0   Multiple  0   Live Births  0            Home Medications    Prior to Admission medications   Medication Sig Start Date End Date Taking? Authorizing Provider  albuterol (PROVENTIL HFA;VENTOLIN HFA) 108 (90 Base) MCG/ACT inhaler Inhale 2 puffs into the lungs every 6 (six) hours as needed for wheezing or shortness of breath. 11/26/16   Lizbeth Bark, FNP  docusate sodium (COLACE) 100 MG capsule Take 100 mg daily as needed by mouth for mild constipation.    [provider]  ibuprofen (ADVIL,MOTRIN)  600 MG tablet Take 1 tablet (600 mg total) every 6 (six) hours as needed by mouth for moderate pain (Take with food.). 05/01/17   Lizbeth Bark, FNP  loperamide (IMODIUM A-D) 2 MG tablet Take 1 tablet (2 mg total) 4 (four) times daily as needed by mouth for diarrhea or loose stools. 05/01/17   Lizbeth Bark, FNP  metroNIDAZOLE (FLAGYL) 500 MG tablet Take 1 tablet (500 mg total) by mouth 2 (two) times daily. 06/15/17   Constant, Peggy, MD  naphazoline-glycerin (CLEAR EYES) 0.012-0.2 % SOLN Place 1-2 drops into both eyes 4 (four) times daily as needed for eye irritation.    [provider]  norgestimate-ethinyl estradiol (ORTHO-CYCLEN,SPRINTEC,PREVIFEM) 0.25-35 MG-MCG tablet Take 1 tablet by mouth daily. 06/13/17   Constant, Peggy, MD  ondansetron (ZOFRAN ODT) 4 MG disintegrating tablet Take 1 tablet (4 mg total) by mouth every 8 (eight) hours as needed for nausea or vomiting. 11/15/16   Anders Simmonds, PA-C  polyethylene glycol (MIRALAX / GLYCOLAX) packet Take 17 g daily as needed by mouth for mild constipation.    [provider]  traMADol (ULTRAM) 50 MG tablet Take 1 tablet (50 mg total) every 6 (six) hours  as needed by mouth for severe pain. Patient not taking: Reported on 06/13/2017 05/07/17   Pincus Large, DO  zolpidem (AMBIEN) 5 MG tablet Take 1 tablet (5 mg total) by mouth at bedtime as needed for sleep. 06/13/17   Constant, Peggy, MD    Family History Family History  Problem Relation Age of Onset  . Diabetes Mother   . Hypertension Mother   . Diabetes Father   . Hypertension Father     Social History Social History   Tobacco Use  . Smoking status: Current Every Day Smoker    Packs/day: 1.00    Years: 11.00    Pack years: 11.00    Types: Cigarettes  . Smokeless tobacco: Never Used  Substance Use Topics  . Alcohol use: Yes    Comment: occ  . Drug use: No    Types: Marijuana    Comment: Last used: Last Thursday     Allergies     Penicillins   Review of Systems Review of Systems  Constitutional: Negative for fever.  HENT: Positive for dental problem.   Musculoskeletal: Negative for neck pain.     Physical Exam Updated Vital Signs BP (!) 139/91 (BP Location: Right Arm)   Pulse 61   Temp 98.9 F (37.2 C) (Oral)   Resp 18   SpO2 99%   Physical Exam  Constitutional: She appears well-developed and well-nourished. No distress.  Patient appears uncomfortable.  Tearful.  HENT:  Head: Atraumatic.  Nose: Nose normal.  Mouth/Throat: Oropharynx is clear and moist.  Mouth: absent tooth #13, with exquisite tenderness to palpation of gum, but no obvious abscess noted.  Mild trismus.     Eyes: Conjunctivae are normal.  Neck: Neck supple.  Lymphadenopathy:    She has no cervical adenopathy.  Neurological: She is alert.  Skin: No rash noted.  Psychiatric: She has a normal mood and affect.  Nursing note and vitals reviewed.    ED Treatments / Results  Labs (all labs ordered are listed, but only abnormal results are displayed) Labs Reviewed - No data to display  EKG None  Radiology No results found.  Procedures .Nerve Block Date/Time: 10/25/2017 9:40 AM Performed by: Fayrene Helper, PA-C Authorized by: Fayrene Helper, PA-C   Consent:    Consent obtained:  Verbal   Consent given by:  Patient   Risks discussed:  Nerve damage and unsuccessful block   Alternatives discussed:  No treatment Indications:    Indications:  Pain relief Location:    Body area:  Head   Head nerve blocked: supraperiosteal block tooth #13.   Laterality:  Left Skin anesthesia (see MAR for exact dosages):    Skin anesthesia method:  None Procedure details (see MAR for exact dosages):    Block needle gauge:  27 G   Anesthetic injected:  Bupivacaine 0.5% WITH epi   Steroid injected:  None   Additive injected:  None   Injection procedure:  Anatomic landmarks identified   Paresthesia:  Immediately resolved Post-procedure  details:    Dressing:  None   Outcome:  Pain relieved   Patient tolerance of procedure:  Tolerated well, no immediate complications   (including critical care time)  Medications Ordered in ED Medications - No data to display   Initial Impression / Assessment and Plan / ED Course  I have reviewed the triage vital signs and the nursing notes.  Pertinent labs & imaging results that were available during my care of the patient were reviewed by me  and considered in my medical decision making (see chart for details).     BP (!) 139/91 (BP Location: Right Arm)   Pulse 61   Temp 98.9 F (37.2 C) (Oral)   Resp 18   SpO2 99%    Final Clinical Impressions(s) / ED Diagnoses   Final diagnoses:  Pain due to dental caries    ED Discharge Orders        Ordered    clindamycin (CLEOCIN) 150 MG capsule  Every 6 hours     10/25/17 0942    ibuprofen (ADVIL,MOTRIN) 600 MG tablet  Every 6 hours PRN     10/25/17 0942     9:30 AM Pt with recurrent dental pain, tenderness to L upper gum most significant at location of tooth #13.  No abscess noted.  Pt request pain control, I offered dental block.  Will attempt   9:43 AM Successful attempt of dental nerve block.  Pt given referral to dentist along with resources.  Return precaution given.     Fayrene Helper, PA-C 10/25/17 1610    Linwood Dibbles, MD 10/28/17 [phone]

## 2017-10-25 NOTE — ED Triage Notes (Signed)
Pt reports severe left side dental pain due to broken tooth.

## 2018-05-09 ENCOUNTER — Emergency Department (HOSPITAL_COMMUNITY): Payer: Self-pay

## 2018-05-09 ENCOUNTER — Encounter (HOSPITAL_COMMUNITY): Payer: Self-pay | Admitting: Emergency Medicine

## 2018-05-09 ENCOUNTER — Emergency Department (HOSPITAL_COMMUNITY)
Admission: EM | Admit: 2018-05-09 | Discharge: 2018-05-09 | Disposition: A | Discharge status: Home / Self Care | Payer: Self-pay | Attending: Emergency Medicine | Admitting: Emergency Medicine

## 2018-05-09 DIAGNOSIS — M79645 Pain in left finger(s): Secondary | ICD-10-CM | POA: Insufficient documentation

## 2018-05-09 DIAGNOSIS — Z79899 Other long term (current) drug therapy: Secondary | ICD-10-CM | POA: Insufficient documentation

## 2018-05-09 DIAGNOSIS — F17210 Nicotine dependence, cigarettes, uncomplicated: Secondary | ICD-10-CM | POA: Insufficient documentation

## 2018-05-09 DIAGNOSIS — J4541 Moderate persistent asthma with (acute) exacerbation: Secondary | ICD-10-CM | POA: Insufficient documentation

## 2018-05-09 LAB — INFLUENZA PANEL BY PCR (TYPE A & B)
INFLBPCR: NEGATIVE
Influenza A By PCR: NEGATIVE

## 2018-05-09 MED ORDER — METHOCARBAMOL 500 MG PO TABS: 1000.0000 mg | ORAL_TABLET | Freq: Three times a day (TID) | ORAL | 0 refills | Status: DC

## 2018-05-09 MED ORDER — IPRATROPIUM BROMIDE 0.02 % IN SOLN
0.5000 mg | Freq: Once | RESPIRATORY_TRACT | Status: AC
  Administered 2018-05-09: 0.5 mg via RESPIRATORY_TRACT
  Filled 2018-05-09: qty 2.5

## 2018-05-09 MED ORDER — METHOCARBAMOL 500 MG PO TABS
1000.0000 mg | ORAL_TABLET | Freq: Once | ORAL | Status: AC
  Administered 2018-05-09: 1000 mg via ORAL
  Filled 2018-05-09: qty 2

## 2018-05-09 MED ORDER — PREDNISONE 20 MG PO TABS: ORAL_TABLET | ORAL | 0 refills | Status: DC

## 2018-05-09 MED ORDER — ALBUTEROL SULFATE (2.5 MG/3ML) 0.083% IN NEBU
5.0000 mg | INHALATION_SOLUTION | Freq: Once | RESPIRATORY_TRACT | Status: AC
  Administered 2018-05-09: 5 mg via RESPIRATORY_TRACT
  Filled 2018-05-09: qty 6

## 2018-05-09 MED ORDER — ACETAMINOPHEN 500 MG PO TABS
1000.0000 mg | ORAL_TABLET | Freq: Once | ORAL | Status: AC
Start: 2018-05-09 — End: 2018-05-09
  Administered 2018-05-09: 1000 mg via ORAL
  Filled 2018-05-09: qty 2

## 2018-05-09 MED ORDER — PREDNISONE 20 MG PO TABS
60.0000 mg | ORAL_TABLET | Freq: Once | ORAL | Status: AC
  Administered 2018-05-09: 60 mg via ORAL
  Filled 2018-05-09: qty 3

## 2018-05-09 NOTE — Discharge Instructions (Addendum)
If you develop high fever, vomiting, neck stiffness, trouble breathing, swallowing, or chest pain, or any other new/concerning symptoms and return to the ER for evaluation.  Use your albuterol inhaler 2 puffs every 4 hours for the next 48 hours.  If you are needing it more than this, return to the ER for evaluation.  Follow-up closely with your primary care physician early next week.

## 2018-05-09 NOTE — ED Provider Notes (Signed)
[facility]ENC[facility] Provider Note   CSN: [id number] Arrival date & time: 05/09/18  1142     History   Chief Complaint Chief Complaint  Patient presents with  . Flu-like Symptoms  . Finger Injury    HPI Leslie Yates is a 27 y.o. female.  HPI 27 year old female with asthma presents with a chief complaint of pneumonia versus influenza.  She states that she is been having a cold for 2 weeks.  She describes this is on and off subjective fevers and night sweats as well as cough.  Some sore throat for the 2 weeks as well.  Over the last 3 or 4 days she has noticed she is coughing up yellow sputum and having muscle aches.  Muscle aches are mostly in her chest as well as her left neck.  She has some pain with moving her neck to the left.  Sore throat is steady and she has no trouble swallowing besides some mild pain.  No abdominal pain.  She is been trying some [place] for her cough.  She has left hand pain for about 6 months but yesterday noticed a small bump on her left middle finger and has been having pain and painful range of motion of her finger.  Denies any trauma.  Past Medical History:  Diagnosis Date  . Asthma   . Asthma   . Obesity   . Ovarian cyst   . Sickle cell trait [facility](HCC)     Patient Active Problem List   Diagnosis Date Noted  . Complex cyst of right ovary 05/10/2017  . Ovarian cyst, right 11/10/2016  . Nausea with vomiting 11/09/2016  . Cannabinoid hyperemesis syndrome (HCC) 11/09/2016    Past Surgical History:  Procedure Laterality Date  . INCISION AND DRAINAGE ABSCESS       OB History    Gravida  0   Para  0   Term  0   Preterm  0   AB  0   Living  0     SAB  0   TAB  0   Ectopic  0   Multiple  0   Live Births  0            Home Medications    Prior to Admission medications   Medication Sig Start Date End Date Taking? Authorizing Provider  albuterol (PROVENTIL HFA;VENTOLIN HFA) 108 (90 Base)  MCG/ACT inhaler Inhale 2 puffs into the lungs every 6 (six) hours as needed for wheezing or shortness of breath. 11/26/16   Lizbeth BarkHairston, Mandesia R, FNP  clindamycin (CLEOCIN) 150 MG capsule Take 1 capsule (150 mg total) by mouth every 6 (six) hours. 10/25/17   Fayrene Helperran, Bowie, PA-C  docusate sodium (COLACE) 100 MG capsule Take 100 mg daily as needed by mouth for mild constipation.    [provider]  ibuprofen (ADVIL,MOTRIN) 600 MG tablet Take 1 tablet (600 mg total) by mouth every 6 (six) hours as needed for moderate pain (Take with food.). 10/25/17   Fayrene Helperran, Bowie, PA-C  loperamide (IMODIUM A-D) 2 MG tablet Take 1 tablet (2 mg total) 4 (four) times daily as needed by mouth for diarrhea or loose stools. 05/01/17   Lizbeth BarkHairston, Mandesia R, FNP  methocarbamol (ROBAXIN) 500 MG tablet Take 2 tablets (1,000 mg total) by mouth 3 (three) times daily. 05/09/18   Pricilla LovelessGoldston, Cleon Signorelli, MD  metroNIDAZOLE (FLAGYL) 500 MG tablet Take 1 tablet (500 mg total) by mouth 2 (two) times daily. 06/15/17  Constant, Peggy, MD  naphazoline-glycerin (CLEAR EYES) 0.012-0.2 % SOLN Place 1-2 drops into both eyes 4 (four) times daily as needed for eye irritation.    [provider]  norgestimate-ethinyl estradiol (ORTHO-CYCLEN,SPRINTEC,PREVIFEM) 0.25-35 MG-MCG tablet Take 1 tablet by mouth daily. 06/13/17   Constant, Peggy, MD  ondansetron (ZOFRAN ODT) 4 MG disintegrating tablet Take 1 tablet (4 mg total) by mouth every 8 (eight) hours as needed for nausea or vomiting. 11/15/16   Anders Simmonds, PA-C  polyethylene glycol (MIRALAX / GLYCOLAX) packet Take 17 g daily as needed by mouth for mild constipation.    [provider]  predniSONE (DELTASONE) 20 MG tablet 2 tabs po daily x 4 days 05/10/18   Pricilla Loveless, MD  zolpidem (AMBIEN) 5 MG tablet Take 1 tablet (5 mg total) by mouth at bedtime as needed for sleep. 06/13/17   Constant, Peggy, MD    Family History Family History  Problem Relation Age of Onset  . Diabetes  Mother   . Hypertension Mother   . Diabetes Father   . Hypertension Father     Social History Social History   Tobacco Use  . Smoking status: Current Every Day Smoker    Packs/day: 1.00    Years: 11.00    Pack years: 11.00    Types: Cigarettes  . Smokeless tobacco: Never Used  Substance Use Topics  . Alcohol use: Yes    Comment: occ  . Drug use: No    Types: Marijuana    Comment: Last used: Last Thursday     Allergies   Penicillins   Review of Systems Review of Systems  Constitutional: Positive for chills, diaphoresis (night sweats) and fever (subjective).  Respiratory: Positive for cough and shortness of breath.   Cardiovascular: Positive for chest pain.  Gastrointestinal: Negative for abdominal pain and vomiting.  Musculoskeletal: Positive for arthralgias, joint swelling, myalgias and neck pain.  All other systems reviewed and are negative.    Physical Exam Updated Vital Signs BP (!) 143/107 (BP Location: Right Arm)   Pulse 96   Temp 99.7 F (37.6 C) (Oral)   Resp 18   SpO2 98%   Physical Exam  Constitutional: She appears well-developed and well-nourished. No distress.  Morbidly obese  HENT:  Head: Normocephalic and atraumatic.  Right Ear: External ear normal.  Left Ear: External ear normal.  Nose: Nose normal.  Mouth/Throat: No oropharyngeal exudate.  Eyes: Right eye exhibits no discharge. Left eye exhibits no discharge.  Neck: Neck supple. Muscular tenderness present. No neck rigidity. Decreased range of motion (mildly limited going to left) present.    Cardiovascular: Normal rate, regular rhythm and normal heart sounds.  Pulmonary/Chest: Effort normal. She has wheezes.  Abdominal: Soft. There is no tenderness.  Musculoskeletal:       Left hand: She exhibits tenderness.       Hands: Neurological: She is alert.  Skin: Skin is warm and dry. She is not diaphoretic.  Psychiatric: Her mood appears not anxious.  Nursing note and vitals  reviewed.    ED Treatments / Results  Labs (all labs ordered are listed, but only abnormal results are displayed) Labs Reviewed  INFLUENZA PANEL BY PCR (TYPE A & B)    EKG EKG Interpretation  Date/Time:  Friday May 09 2018 12:10:41 EST Ventricular Rate:  85 PR Interval:  144 QRS Duration: 76 QT Interval:  374 QTC Calculation: 445 R Axis:   76 Text Interpretation:  Normal sinus rhythm with sinus arrhythmia rate is  faster, but otherwise ECG unchanged since May 2018 Confirmed by Pricilla Loveless [phone]) on 05/09/2018 12:17:49 PM   Radiology Dg Chest 2 View  Result Date: 05/09/2018 CLINICAL DATA:  Cough cough, body aches EXAM: CHEST - 2 VIEW COMPARISON:  07/02/2014 FINDINGS: Normal heart size, mediastinal contours, and pulmonary vascularity. Minimal chronic peribronchial thickening. No acute infiltrate, pleural effusion or pneumothorax. Bones unremarkable. IMPRESSION: Minimal chronic bronchitic changes without infiltrate. Electronically Signed   By: Ulyses Southward M.D.   On: 05/09/2018 12:45   Dg Hand Complete Left  Result Date: 05/09/2018 CLINICAL DATA:  Pain at proximal middle finger question injury EXAM: LEFT HAND - COMPLETE 3+ VIEW COMPARISON:  None FINDINGS: Osseous mineralization normal. Joint spaces preserved. No fracture, dislocation, or bone destruction. IMPRESSION: Normal exam. Electronically Signed   By: Ulyses Southward M.D.   On: 05/09/2018 12:46    Procedures Procedures (including critical care time)  Medications Ordered in ED Medications  acetaminophen (TYLENOL) tablet 1,000 mg (1,000 mg Oral Given 05/09/18 1201)  methocarbamol (ROBAXIN) tablet 1,000 mg (1,000 mg Oral Given 05/09/18 1201)  albuterol (PROVENTIL) (2.5 MG/3ML) 0.083% nebulizer solution 5 mg (5 mg Nebulization Given 05/09/18 1202)  ipratropium (ATROVENT) nebulizer solution 0.5 mg (0.5 mg Nebulization Given 05/09/18 1202)  predniSONE (DELTASONE) tablet 60 mg (60 mg Oral Given 05/09/18 1202)      Initial Impression / Assessment and Plan / ED Course  I have reviewed the triage vital signs and the nursing notes.  Pertinent labs & imaging results that were available during my care of the patient were reviewed by me and considered in my medical decision making (see chart for details).     Patient's dyspnea and cough are likely all related to asthma.  Possibly a viral component as well but there does not appear to be an obvious pneumonia and her influenza swab was negative.  She is feeling much better after albuterol.  Her neck is moving better after Tylenol and Robaxin.  There is probably a muscle spasm component but given 1 week of sore throat and this left-sided neck muscular pain, I highly doubt deep space infection.  I think she would be much more ill at this time.  She is moving her neck better.  Her finger appears to have a small painful subcutaneous nodule but not consistent with an abscess.  I have advised use warm compresses, Tylenol and ibuprofen.  She will be discharged with a steroid burst and advised to follow-up closely with her PCP.  Return precautions.  Final Clinical Impressions(s) / ED Diagnoses   Final diagnoses:  Moderate persistent asthma with exacerbation  Pain in finger of left hand    ED Discharge Orders         Ordered    predniSONE (DELTASONE) 20 MG tablet     05/09/18 1319    methocarbamol (ROBAXIN) 500 MG tablet  3 times daily     05/09/18 1319           Pricilla Loveless, MD 05/09/18 1323

## 2018-05-09 NOTE — ED Notes (Signed)
Patient able to ambulate independently  

## 2018-05-09 NOTE — ED Triage Notes (Signed)
Pt presents to ED for assessment of 2 weeks of generalized body aches, intermittent fevers, congestion.  Also states for 6 months she's had swelling and pain to the middle finger of her left hand.  Patient denies known injury.

## 2018-05-21 ENCOUNTER — Inpatient Hospital Stay: Payer: Self-pay

## 2018-05-21 ENCOUNTER — Ambulatory Visit: Payer: Self-pay

## 2018-05-21 NOTE — Progress Notes (Deleted)
Patient ID: Leslie Yates, female   DOB: [birth year], 27 y.o.   MRN: [id number]  After being seen in the ED 05/09/2018.  From ED note: HPI 27 year old female with asthma presents with a chief complaint of pneumonia versus influenza.  She states that she is been having a cold for 2 weeks.  She describes this is on and off subjective fevers and night sweats as well as cough.  Some sore throat for the 2 weeks as well.  Over the last 3 or 4 days she has noticed she is coughing up yellow sputum and having muscle aches.  Muscle aches are mostly in her chest as well as her left neck.  She has some pain with moving her neck to the left.  Sore throat is steady and she has no trouble swallowing besides some mild pain.  No abdominal pain.  She is been trying some [place] for her cough.  She has left hand pain for about 6 months but yesterday noticed a small bump on her left middle finger and has been having pain and painful range of motion of her finger.  Denies any trauma.  From A/P: Patient's dyspnea and cough are likely all related to asthma.  Possibly a viral component as well but there does not appear to be an obvious pneumonia and her influenza swab was negative.  She is feeling much better after albuterol.  Her neck is moving better after Tylenol and Robaxin.  There is probably a muscle spasm component but given 1 week of sore throat and this left-sided neck muscular pain, I highly doubt deep space infection.  I think she would be much more ill at this time.  She is moving her neck better.  Her finger appears to have a small painful subcutaneous nodule but not consistent with an abscess.  I have advised use warm compresses, Tylenol and ibuprofen.  She will be discharged with a steroid burst and advised to follow-up closely with her PCP.  Return precautions.

## 2018-05-26 ENCOUNTER — Ambulatory Visit: Payer: Self-pay

## 2018-06-30 ENCOUNTER — Emergency Department (HOSPITAL_COMMUNITY)
Admission: EM | Admit: 2018-06-30 | Discharge: 2018-07-01 | Disposition: A | Discharge status: Home / Self Care | Payer: Self-pay | Attending: Emergency Medicine | Admitting: Emergency Medicine

## 2018-06-30 ENCOUNTER — Encounter (HOSPITAL_COMMUNITY): Payer: Self-pay

## 2018-06-30 ENCOUNTER — Other Ambulatory Visit: Payer: Self-pay

## 2018-06-30 DIAGNOSIS — Z23 Encounter for immunization: Secondary | ICD-10-CM | POA: Insufficient documentation

## 2018-06-30 DIAGNOSIS — F17210 Nicotine dependence, cigarettes, uncomplicated: Secondary | ICD-10-CM | POA: Insufficient documentation

## 2018-06-30 DIAGNOSIS — J45909 Unspecified asthma, uncomplicated: Secondary | ICD-10-CM | POA: Insufficient documentation

## 2018-06-30 DIAGNOSIS — S61012A Laceration without foreign body of left thumb without damage to nail, initial encounter: Secondary | ICD-10-CM | POA: Insufficient documentation

## 2018-06-30 DIAGNOSIS — Y999 Unspecified external cause status: Secondary | ICD-10-CM | POA: Insufficient documentation

## 2018-06-30 DIAGNOSIS — Y939 Activity, unspecified: Secondary | ICD-10-CM | POA: Insufficient documentation

## 2018-06-30 DIAGNOSIS — Y929 Unspecified place or not applicable: Secondary | ICD-10-CM | POA: Insufficient documentation

## 2018-06-30 DIAGNOSIS — Z79899 Other long term (current) drug therapy: Secondary | ICD-10-CM | POA: Insufficient documentation

## 2018-06-30 DIAGNOSIS — X58XXXA Exposure to other specified factors, initial encounter: Secondary | ICD-10-CM | POA: Insufficient documentation

## 2018-06-30 MED ORDER — BACITRACIN ZINC 500 UNIT/GM EX OINT
1.0000 "application " | TOPICAL_OINTMENT | Freq: Two times a day (BID) | CUTANEOUS | Status: DC
  Administered 2018-07-01: 1 via TOPICAL
  Filled 2018-06-30: qty 1.8

## 2018-06-30 MED ORDER — TETANUS-DIPHTH-ACELL PERTUSSIS 5-2.5-18.5 LF-MCG/0.5 IM SUSP
0.5000 mL | Freq: Once | INTRAMUSCULAR | Status: AC
  Administered 2018-07-01: 0.5 mL via INTRAMUSCULAR
  Filled 2018-06-30: qty 0.5

## 2018-06-30 NOTE — ED Triage Notes (Signed)
Pt arrives POV s/p L thumb lac sustained yesterday. Wound is hemostatic, covered by band aid. 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 Confirmed w/ pt she in fact meant TB-Tuberculosis, and pt confirms that is the test that they need. Denies concerns w/ wound. GCS15, NAD

## 2018-06-30 NOTE — ED Provider Notes (Signed)
[facility] EMERGENCY DEPARTMENT Provider Note   CSN: [id number] Arrival date & time: 06/30/18  1957     History   Chief Complaint Chief Complaint  Patient presents with  . Finger Injury    HPI Leslie Yates is a 28 y.o. female.  Patient presents to the emergency department with a chief complaint of left thumb injury.  She states that she cut her left thumb on a doorknob yesterday.  Hemostasis achieved prior to arrival.  Last tetanus shot unknown.  Denies any other injuries.  States that her employer wants her to have a TB test prior to returning to work.  The history is provided by the patient. No language interpreter was used.    Past Medical History:  Diagnosis Date  . Asthma   . Asthma   . Obesity   . Ovarian cyst   . Sickle cell trait [facility])     Patient Active Problem List   Diagnosis Date Noted  . Complex cyst of right ovary 05/10/2017  . Ovarian cyst, right 11/10/2016  . Nausea with vomiting 11/09/2016  . Cannabinoid hyperemesis syndrome (HCC) 11/09/2016    Past Surgical History:  Procedure Laterality Date  . INCISION AND DRAINAGE ABSCESS       OB History    Gravida  0   Para  0   Term  0   Preterm  0   AB  0   Living  0     SAB  0   TAB  0   Ectopic  0   Multiple  0   Live Births  0            Home Medications    Prior to Admission medications   Medication Sig Start Date End Date Taking? Authorizing Provider  albuterol (PROVENTIL HFA;VENTOLIN HFA) 108 (90 Base) MCG/ACT inhaler Inhale 2 puffs into the lungs every 6 (six) hours as needed for wheezing or shortness of breath. 11/26/16   Lizbeth Bark, FNP  clindamycin (CLEOCIN) 150 MG capsule Take 1 capsule (150 mg total) by mouth every 6 (six) hours. 10/25/17   Fayrene Helper, PA-C  docusate sodium (COLACE) 100 MG capsule Take 100 mg daily as needed by mouth for mild constipation.    [provider]  ibuprofen (ADVIL,MOTRIN) 600 MG tablet Take 1 tablet  (600 mg total) by mouth every 6 (six) hours as needed for moderate pain (Take with food.). 10/25/17   Fayrene Helper, PA-C  loperamide (IMODIUM A-D) 2 MG tablet Take 1 tablet (2 mg total) 4 (four) times daily as needed by mouth for diarrhea or loose stools. 05/01/17   Lizbeth Bark, FNP  methocarbamol (ROBAXIN) 500 MG tablet Take 2 tablets (1,000 mg total) by mouth 3 (three) times daily. 05/09/18   Pricilla Loveless, MD  metroNIDAZOLE (FLAGYL) 500 MG tablet Take 1 tablet (500 mg total) by mouth 2 (two) times daily. 06/15/17   Constant, Peggy, MD  naphazoline-glycerin (CLEAR EYES) 0.012-0.2 % SOLN Place 1-2 drops into both eyes 4 (four) times daily as needed for eye irritation.    [provider]  norgestimate-ethinyl estradiol (ORTHO-CYCLEN,SPRINTEC,PREVIFEM) 0.25-35 MG-MCG tablet Take 1 tablet by mouth daily. 06/13/17   Constant, Peggy, MD  ondansetron (ZOFRAN ODT) 4 MG disintegrating tablet Take 1 tablet (4 mg total) by mouth every 8 (eight) hours as needed for nausea or vomiting. 11/15/16   Anders Simmonds, PA-C  polyethylene glycol (MIRALAX / GLYCOLAX) packet Take 17 g daily as needed  by mouth for mild constipation.    [provider]  predniSONE (DELTASONE) 20 MG tablet 2 tabs po daily x 4 days 05/10/18   Pricilla LovelessGoldston, Scott, MD  zolpidem (AMBIEN) 5 MG tablet Take 1 tablet (5 mg total) by mouth at bedtime as needed for sleep. 06/13/17   Constant, Peggy, MD    Family History Family History  Problem Relation Age of Onset  . Diabetes Mother   . Hypertension Mother   . Diabetes Father   . Hypertension Father     Social History Social History   Tobacco Use  . Smoking status: Current Every Day Smoker    Packs/day: 1.00    Years: 11.00    Pack years: 11.00    Types: Cigarettes  . Smokeless tobacco: Never Used  Substance Use Topics  . Alcohol use: Yes    Comment: occ  . Drug use: No    Types: Marijuana    Comment: Last used: Last Thursday     Allergies     Penicillins   Review of Systems Review of Systems  All other systems reviewed and are negative.    Physical Exam Updated Vital Signs BP 132/85 (BP Location: Right Arm)   Pulse 70   Temp 98.6 F (37 C) (Oral)   Resp 16   Ht 5\' 11"  (1.803 m)   Wt 134.7 kg   SpO2 98%   BMI 41.42 kg/m   Physical Exam Vitals signs and nursing note reviewed.  Constitutional:      Appearance: She is well-developed.  HENT:     Head: Normocephalic and atraumatic.  Eyes:     Conjunctiva/sclera: Conjunctivae normal.  Neck:     Musculoskeletal: Normal range of motion.  Cardiovascular:     Rate and Rhythm: Normal rate.  Pulmonary:     Effort: Pulmonary effort is normal.  Abdominal:     General: There is no distension.  Musculoskeletal: Normal range of motion.  Skin:    General: Skin is dry.     Comments: Very shallow, minor, 0.5 cm laceration to radial aspect of left thumb, no underlying structures involved  Neurological:     Mental Status: She is alert and oriented to person, place, and time.  Psychiatric:        Behavior: Behavior normal.        Thought Content: Thought content normal.        Judgment: Judgment normal.      ED Treatments / Results  Labs (all labs ordered are listed, but only abnormal results are displayed) Labs Reviewed - No data to display  EKG None  Radiology No results found.  Procedures Procedures (including critical care time)  Medications Ordered in ED Medications  bacitracin ointment 1 application (has no administration in time range)  Tdap (BOOSTRIX) injection 0.5 mL (has no administration in time range)     Initial Impression / Assessment and Plan / ED Course  I have reviewed the triage vital signs and the nursing notes.  Pertinent labs & imaging results that were available during my care of the patient were reviewed by me and considered in my medical decision making (see chart for details).     Patient with minor thumb laceration  yesterday.  Tetanus shot updated today.  No further wound care needed other than Band-Aid.  It is unclear to me the employer requires a TB test prior to returning to work after sustaining a thumb injury.  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  Her tetanus shot has been updated.  Discussed the TB testing is not done in the emergency department.  Follow-up with her primary care doctor.  Final Clinical Impressions(s) / ED Diagnoses   Final diagnoses:  Laceration of left thumb without foreign body without damage to nail, initial encounter    ED Discharge Orders    None       Roxy Horseman, PA-C 06/30/18 2329    Ward, Layla Maw, DO 06/30/18 2338

## 2018-07-18 ENCOUNTER — Ambulatory Visit: Payer: Self-pay | Admitting: Family Medicine

## 2018-08-19 ENCOUNTER — Encounter: Payer: Self-pay | Admitting: Nurse Practitioner

## 2018-08-19 ENCOUNTER — Ambulatory Visit: Payer: Self-pay | Attending: Family Medicine | Admitting: Nurse Practitioner

## 2018-08-19 VITALS — BP 109/70 | HR 81 | Temp 98.7°F | Ht 72.0 in | Wt 302.6 lb

## 2018-08-19 DIAGNOSIS — N760 Acute vaginitis: Secondary | ICD-10-CM

## 2018-08-19 DIAGNOSIS — L732 Hidradenitis suppurativa: Secondary | ICD-10-CM

## 2018-08-19 LAB — POCT URINALYSIS DIP (CLINITEK)
Bilirubin, UA: NEGATIVE
Glucose, UA: NEGATIVE mg/dL
Ketones, POC UA: NEGATIVE mg/dL
LEUKOCYTES UA: NEGATIVE
NITRITE UA: NEGATIVE
PH UA: 7 (ref 5.0–8.0)
POC PROTEIN,UA: NEGATIVE
Spec Grav, UA: 1.015 (ref 1.010–1.025)
UROBILINOGEN UA: 1 U/dL

## 2018-08-19 MED ORDER — CHLORHEXIDINE GLUCONATE 4 % EX LIQD: Freq: Every day | CUTANEOUS | 0 refills | Status: DC | PRN

## 2018-08-19 NOTE — Progress Notes (Signed)
Assessment & Plan:  [name] was seen today for establish care.  Diagnoses and all orders for this visit:  Acute vaginitis -     POCT URINALYSIS DIP (CLINITEK) -     Urine cytology ancillary only  Hidradenitis suppurativa -     chlorhexidine (HIBICLENS) 4 % external liquid; Apply topically daily as needed.  Morbid obesity with body mass index (BMI) of 40.0 or higher ([place]) -     CBC -     CMP14+EGFR    Patient has been counseled on age-appropriate routine health concerns for screening and prevention. These are reviewed and up-to-date. Referrals have been placed accordingly. Immunizations are up-to-date or declined.    Subjective:   Chief Complaint  Patient presents with  . Establish Care    Pt stated she is having vaginal discharge.    HPI Leslie Yates 28 y.o. female presents to office today to establish care. 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 She is sexually active with no contraception. She takes OTC tylenol and ibuprofen for chronic and frequent headaches. Headache symptoms include photophobia. She has not seen an eye specialist in 2-3 years. Denies nausea or vomiting.   Vaginitis Endorses clear vaginal discharge with some odor. Symptom onset 1 month ago. Same sexual partner for 6 years. Denies dysuria, abdominal or pelvic pain, suprapubic pressure. STI Risk: Possible STD exposure.     Review of Systems  Constitutional: Negative for fever, malaise/fatigue and weight loss.  HENT: Negative.  Negative for nosebleeds.   Eyes: Negative.  Negative for blurred vision, double vision and photophobia.  Respiratory: Negative.  Negative for cough and shortness of breath.   Cardiovascular: Negative.  Negative for chest pain, palpitations and leg  swelling.  Gastrointestinal: Negative.  Negative for heartburn, nausea and vomiting.  Genitourinary:       SEE HPI  Musculoskeletal: Negative.  Negative for myalgias.  Skin:       Hidradenitis supparativa  Neurological: Positive for headaches. Negative for dizziness, focal weakness and seizures.  Psychiatric/Behavioral: Negative.  Negative for suicidal ideas.    Past Medical History:  Diagnosis Date  . Asthma   . Asthma   . Obesity   . Ovarian cyst   . Sickle cell trait [facility])     Past Surgical History:  Procedure Laterality Date  . INCISION AND DRAINAGE ABSCESS      Family History  Problem Relation Age of Onset  . Diabetes Mother   . Hypertension Mother   . Diabetes Father   . Hypertension Father     Social History Reviewed with no changes to be made today.   Outpatient Medications Prior to Visit  Medication Sig Dispense Refill  . albuterol (PROVENTIL HFA;VENTOLIN HFA) 108 (90 Base) MCG/ACT inhaler Inhale 2 puffs into the lungs every 6 (six) hours as needed for wheezing or shortness of breath. 1 Inhaler 2  . docusate sodium (COLACE) 100 MG capsule Take 100 mg daily as needed by mouth for mild constipation.    Marland Kitchen ibuprofen (ADVIL,MOTRIN) 600 MG tablet Take 1 tablet (600 mg total) by mouth every 6 (six) hours as needed for moderate pain (Take with food.). 30 tablet 0  . polyethylene glycol (MIRALAX / GLYCOLAX) packet Take 17 g daily as needed by mouth for mild constipation.    Marland Kitchen  methocarbamol (ROBAXIN) 500 MG tablet Take 2 tablets (1,000 mg total) by mouth 3 (three) times daily. (Patient not taking: Reported on 08/19/2018) 20 tablet 0  . naphazoline-glycerin (CLEAR EYES) 0.012-0.2 % SOLN Place 1-2 drops into both eyes 4 (four) times daily as needed for eye irritation.    . clindamycin (CLEOCIN) 150 MG capsule Take 1 capsule (150 mg total) by mouth every 6 (six) hours. (Patient not taking: Reported on 08/19/2018) 28 capsule 0  . loperamide (IMODIUM A-D) 2 MG tablet Take 1 tablet  (2 mg total) 4 (four) times daily as needed by mouth for diarrhea or loose stools. (Patient not taking: Reported on 08/19/2018) 30 tablet 0  . metroNIDAZOLE (FLAGYL) 500 MG tablet Take 1 tablet (500 mg total) by mouth 2 (two) times daily. (Patient not taking: Reported on 08/19/2018) 14 tablet 0  . norgestimate-ethinyl estradiol (ORTHO-CYCLEN,SPRINTEC,PREVIFEM) 0.25-35 MG-MCG tablet Take 1 tablet by mouth daily. (Patient not taking: Reported on 08/19/2018) 1 Package 11  . ondansetron (ZOFRAN ODT) 4 MG disintegrating tablet Take 1 tablet (4 mg total) by mouth every 8 (eight) hours as needed for nausea or vomiting. (Patient not taking: Reported on 08/19/2018) 40 tablet 0  . predniSONE (DELTASONE) 20 MG tablet 2 tabs po daily x 4 days (Patient not taking: Reported on 08/19/2018) 8 tablet 0  . zolpidem (AMBIEN) 5 MG tablet Take 1 tablet (5 mg total) by mouth at bedtime as needed for sleep. (Patient not taking: Reported on 08/19/2018) 30 tablet 1   No facility-administered medications prior to visit.     Allergies  Allergen Reactions  . Penicillins Rash    Childhood rash Has patient had a PCN reaction causing immediate rash, facial/tongue/throat swelling, SOB or lightheadedness with hypotension: no Has patient had a PCN reaction causing severe rash involving mucus membranes or skin necrosis:no Has patient had a PCN reaction that required hospitalization no Has patient had a PCN reaction occurring within the last 10 years: no If all of the above answers are "NO", then may proceed with Cephalosporin use.        Objective:    BP 109/70 (BP Location: Right Arm, Patient Position: Sitting, Cuff Size: Large)   Pulse 81   Temp 98.7 F (37.1 C) (Oral)   Ht 6' (1.829 m)   Wt (!) 302 lb 9.6 oz (137.3 kg)   LMP 07/30/2018   SpO2 97%   BMI 41.04 kg/m  Wt Readings from Last 3 Encounters:  08/19/18 (!) 302 lb 9.6 oz (137.3 kg)  06/30/18 297 lb (134.7 kg)  06/13/17 273 lb 1.6 oz (123.9 kg)    Physical  Exam Vitals signs and nursing note reviewed.  Constitutional:      Appearance: She is well-developed.  HENT:     Head: Normocephalic and atraumatic.  Neck:     Musculoskeletal: Normal range of motion.  Cardiovascular:     Rate and Rhythm: Normal rate and regular rhythm.     Heart sounds: Normal heart sounds. No murmur. No friction rub. No gallop.   Pulmonary:     Effort: Pulmonary effort is normal. No tachypnea or respiratory distress.     Breath sounds: Normal breath sounds. No decreased breath sounds, wheezing, rhonchi or rales.  Chest:     Chest wall: No tenderness.  Abdominal:     General: Bowel sounds are normal.     Palpations: Abdomen is soft.  Genitourinary:    Comments: GU exam deferred  Musculoskeletal: Normal range of motion.  Skin:  General: Skin is warm and dry.  Neurological:     Mental Status: She is alert and oriented to person, place, and time.     Coordination: Coordination normal.  Psychiatric:        Behavior: Behavior normal. Behavior is cooperative.        Thought Content: Thought content normal.        Judgment: Judgment normal.        Patient has been counseled extensively about nutrition and exercise as well as the importance of adherence with medications and regular follow-up. The patient was given clear instructions to go to ER or return to medical center if symptoms don't improve, worsen or new problems develop. The patient verbalized understanding.   Follow-up: Return for PAP SMEAR.   Gildardo Pounds, FNP-BC [facility] [place], [place]   08/19/2018, 6:19 PM

## 2018-08-19 NOTE — Patient Instructions (Addendum)
Read food labels: no more than 60 carbs total per day. More protein and good fats like olive oils, nuts, etc.   Hidradenitis Suppurativa Hidradenitis suppurativa is a long-term (chronic) skin disease. It is similar to a severe form of acne, but it affects areas of the body where acne would be unusual, especially areas of the body where skin rubs against skin and becomes moist. These include:  Underarms.  Groin.  Genital area.  Buttocks.  Upper thighs.  Breasts. Hidradenitis suppurativa may start out as small lumps or pimples caused by blocked sweat glands or hair follicles. Pimples may develop into deep sores that break open (rupture) and drain pus. Over time, affected areas of skin may thicken and become scarred. This condition is rare and does not spread from person to person (non-contagious). What are the causes? The exact cause of this condition is not known. It may be related to:  Female and female hormones.  An overactive disease-fighting system (immune system). The immune system may over-react to blocked hair follicles or sweat glands and cause swelling and pus-filled sores. What increases the risk? You are more likely to develop this condition if you:  Are female.  Are 28-67 years old.  Have a family history of hidradenitis suppurativa.  Have a personal history of acne.  Are overweight.  Smoke.  Take the medicine lithium. What are the signs or symptoms? The first symptoms are usually painful bumps in the skin, similar to pimples. The condition may get worse over time (progress), or it may only cause mild symptoms. If the disease progresses, symptoms may include:  Skin bumps getting bigger and growing deeper into the skin.  Bumps rupturing and draining pus.  Itchy, infected skin.  Skin getting thicker and scarred.  Tunnels under the skin (fistulas) where pus drains from a bump.  Pain during daily activities, such as pain during walking if your groin area is  affected.  Emotional problems, such as stress or depression. This condition may affect your appearance and your ability or willingness to wear certain clothes or do certain activities. How is this diagnosed? This condition is diagnosed by a health care provider who specializes in skin diseases (dermatologist). You may be diagnosed based on:  Your symptoms and medical history.  A physical exam.  Testing a pus sample for infection.  Blood tests. How is this treated? Your treatment will depend on how severe your symptoms are. The same treatment will not work for everybody with this condition. You may need to try several treatments to find what works best for you. Treatment may include:  Cleaning and bandaging (dressing) your wounds as needed.  Lifestyle changes, such as new skin care routines.  Taking medicines, such as: ? Antibiotics. ? Acne medicines. ? Medicines to reduce the activity of the immune system. ? A diabetes medicine (metformin). ? Birth control pills, for women. ? Steroids to reduce swelling and pain.  Working with a mental health care provider, if you experience emotional distress due to this condition. If you have severe symptoms that do not get better with medicine, you may need surgery. Surgery may involve:  Using a laser to clear the skin and remove hair follicles.  Opening and draining deep sores.  Removing the areas of skin that are diseased and scarred. Follow these instructions at home: Medicines   Take over-the-counter and prescription medicines only as told by your health care provider.  If you were prescribed an antibiotic medicine, take it as told by your  health care provider. Do not stop taking the antibiotic even if your condition improves. Skin care  If you have open wounds, cover them with a clean dressing as told by your health care provider. Keep wounds clean by washing them gently with soap and water when you bathe.  Do not shave the  areas where you get hidradenitis suppurativa.  Do not wear deodorant.  Wear loose-fitting clothes.  Try to avoid getting overheated or sweaty. If you get sweaty or wet, change into clean, dry clothes as soon as you can.  To help relieve pain and itchiness, cover sore areas with a warm, clean washcloth (warm compress) for 5-10 minutes as often as needed.  If told by your health care provider, take a bleach bath twice a week: ? Fill your bathtub halfway with water. ? Pour in  cup of unscented household bleach. ? Soak in the tub for 5-10 minutes. ? Only soak from the neck down. Avoid water on your face and hair. ? Shower to rinse off the bleach from your skin. General instructions  Learn as much as you can about your disease so that you have an active role in your treatment. Work closely with your health care provider to find treatments that work for you.  If you are overweight, work with your health care provider to lose weight as recommended.  Do not use any products that contain nicotine or tobacco, such as cigarettes and e-cigarettes. If you need help quitting, ask your health care provider.  If you struggle with living with this condition, talk with your health care provider or work with a mental health care provider as recommended.  Keep all follow-up visits as told by your health care provider. This is important. Where to find more information  Hidradenitis Suppurativa Foundation, Inc.: https://www.hs-foundation.org/ Contact a health care provider if you have:  A flare-up of hidradenitis suppurativa.  A fever or chills.  Trouble controlling your symptoms at home.  Trouble doing your daily activities because of your symptoms.  Trouble dealing with emotional problems related to your condition. Summary  Hidradenitis suppurativa is a long-term (chronic) skin disease. It is similar to a severe form of acne, but it affects areas of the body where acne would be  unusual.  The first symptoms are usually painful bumps in the skin, similar to pimples. The condition may get worse over time (progress), or it may only cause mild symptoms.  If you have open wounds, cover them with a clean dressing as told by your health care provider. Keep wounds clean by washing them gently with soap and water when you bathe.  Besides skin care, treatment may include medicines, laser treatment, and surgery. This information is not intended to replace advice given to you by your health care provider. Make sure you discuss any questions you have with your health care provider. Document Released: 01/24/2004 Document Revised: 06/19/2017 Document Reviewed: 06/19/2017 Elsevier Interactive Patient Education  2019 Elsevier Inc.  Preventing Unhealthy [employer], Adult Staying at a healthy weight is important to your overall health. When fat builds up in your body, you may become overweight or obese. Being overweight or obese increases your risk of developing certain health problems, such as heart disease, diabetes, sleeping problems, joint problems, and some types of cancer. Unhealthy weight gain is often the result of making unhealthy food choices or not getting enough exercise. You can make changes to your lifestyle to prevent obesity and stay as healthy as possible. What nutrition changes  can be made?   Eat only as much as your body needs. To do this: ? Pay attention to signs that you are hungry or full. Stop eating as soon as you feel full. ? If you feel hungry, try drinking water first before eating. Drink enough water so your urine is clear or pale yellow. ? Eat smaller portions. Pay attention to portion sizes when eating out. ? Look at serving sizes on food labels. Most foods contain more than one serving per container. ? Eat the recommended number of calories for your gender and activity level. For most active people, a daily total of 2,000 calories is appropriate. If you  are trying to lose weight or are not very active, you may need to eat fewer calories. Talk with your health care provider or a diet and nutrition specialist (dietitian) about how many calories you need each day.  Choose healthy foods, such as: ? Fruits and vegetables. At each meal, try to fill at least half of your plate with fruits and vegetables. ? Whole grains, such as whole-wheat bread, brown rice, and quinoa. ? Lean meats, such as chicken or fish. ? Other healthy proteins, such as beans, eggs, or tofu. ? Healthy fats, such as nuts, seeds, fatty fish, and olive oil. ? Low-fat or fat-free dairy products.  Check food labels, and avoid food and drinks that: ? Are high in calories. ? Have added sugar. ? Are high in sodium. ? Have saturated fats or trans fats.  Cook foods in healthier ways, such as by baking, broiling, or grilling.  Make a meal plan for the week, and shop with a grocery list to help you stay on track with your purchases. Try to avoid going to the grocery store when you are hungry.  When grocery shopping, try to shop around the outside of the store first, where the fresh foods are. Doing this helps you to avoid prepackaged foods, which can be high in sugar, salt (sodium), and fat. What lifestyle changes can be made?   Exercise for 30 or more minutes on 5 or more days each week. Exercising may include brisk walking, yard work, biking, running, swimming, and team sports like basketball and soccer. Ask your health care provider which exercises are safe for you.  Do muscle-strengthening activities, such as lifting weights or using resistance bands, on 2 or more days a week.  Do not use any products that contain nicotine or tobacco, such as cigarettes and e-cigarettes. If you need help quitting, ask your health care provider.  Limit alcohol intake to no more than 1 drink a day for nonpregnant women and 2 drinks a day for men. One drink equals 12 oz of beer, 5 oz of wine, or 1  oz of hard liquor.  Try to get 7-9 hours of sleep each night. What other changes can be made?  Keep a food and activity journal to keep track of: ? What you ate and how many calories you had. Remember to count the calories in sauces, dressings, and side dishes. ? Whether you were active, and what exercises you did. ? Your calorie, weight, and activity goals.  Check your weight regularly. Track any changes. If you notice you have gained weight, make changes to your diet or activity routine.  Avoid taking weight-loss medicines or supplements. Talk to your health care provider before starting any new medicine or supplement.  Talk to your health care provider before trying any new diet or exercise plan. Why are  these changes important? Eating healthy, staying active, and having healthy habits can help you to prevent obesity. Those changes also:  Help you manage stress and emotions.  Help you connect with friends and family.  Improve your self-esteem.  Improve your sleep.  Prevent long-term health problems. What can happen if changes are not made? Being obese or overweight can cause you to develop joint or bone problems, which can make it hard for you to stay active or do activities you enjoy. Being obese or overweight also puts stress on your heart and lungs and can lead to health problems like diabetes, heart disease, and some cancers. Where to find more information Talk with your health care provider or a dietitian about healthy eating and healthy lifestyle choices. You may also find information from:  U.S. Department of Agriculture, MyPlate: https://ball-collins.biz/  American Heart Association: www.heart.org  Centers for Disease Control and Prevention: FootballExhibition.com.br Summary  Staying at a healthy weight is important to your overall health. It helps you to prevent certain diseases and health problems, such as heart disease, diabetes, joint problems, sleep disorders, and some types of  cancer.  Being obese or overweight can cause you to develop joint or bone problems, which can make it hard for you to stay active or do activities you enjoy.  You can prevent unhealthy weight gain by eating a healthy diet, exercising regularly, not smoking, limiting alcohol, and getting enough sleep.  Talk with your health care provider or a dietitian for guidance about healthy eating and healthy lifestyle choices. This information is not intended to replace advice given to you by your health care provider. Make sure you discuss any questions you have with your health care provider. Document Released: 06/12/2016 Document Revised: 03/22/2017 Document Reviewed: 07/18/2016 Elsevier Interactive Patient Education  2019 [employer].

## 2018-08-20 ENCOUNTER — Ambulatory Visit: Payer: Self-pay | Admitting: Family Medicine

## 2018-08-20 LAB — CMP14+EGFR
A/G RATIO: 2.1 (ref 1.2–2.2)
ALT: 9 IU/L (ref 0–32)
AST: 15 IU/L (ref 0–40)
Albumin: 3.8 g/dL — ABNORMAL LOW (ref 3.9–5.0)
Alkaline Phosphatase: 41 IU/L (ref 39–117)
BUN/Creatinine Ratio: 9 (ref 9–23)
BUN: 8 mg/dL (ref 6–20)
Bilirubin Total: 0.3 mg/dL (ref 0.0–1.2)
CHLORIDE: 108 mmol/L — AB (ref 96–106)
CO2: 23 mmol/L (ref 20–29)
CREATININE: 0.85 mg/dL (ref 0.57–1.00)
Calcium: 8.4 mg/dL — ABNORMAL LOW (ref 8.7–10.2)
GFR calc Af Amer: 109 mL/min/{1.73_m2} (ref >59)
GFR calc non Af Amer: 94 mL/min/{1.73_m2} (ref >59)
GLOBULIN, TOTAL: 1.8 g/dL (ref 1.5–4.5)
Glucose: 82 mg/dL (ref 65–99)
POTASSIUM: 4.1 mmol/L (ref 3.5–5.2)
SODIUM: 144 mmol/L (ref 134–144)
Total Protein: 5.6 g/dL — ABNORMAL LOW (ref 6.0–8.5)

## 2018-08-20 LAB — CBC
Hematocrit: 37 % (ref 34.0–46.6)
Hemoglobin: 12.9 g/dL (ref 11.1–15.9)
MCH: 31.2 pg (ref 26.6–33.0)
MCHC: 34.9 g/dL (ref 31.5–35.7)
MCV: 90 fL (ref 79–97)
PLATELETS: 236 10*3/uL (ref 150–450)
RBC: 4.13 x10E6/uL (ref 3.77–5.28)
RDW: 12.4 % (ref 11.7–15.4)
WBC: 7.9 10*3/uL (ref 3.4–10.8)

## 2018-08-21 ENCOUNTER — Ambulatory Visit: Payer: Self-pay | Admitting: Family Medicine

## 2018-08-21 LAB — URINE CYTOLOGY ANCILLARY ONLY
CANDIDA VAGINITIS: NEGATIVE
CHLAMYDIA, DNA PROBE: NEGATIVE
NEISSERIA GONORRHEA: NEGATIVE
Trichomonas: NEGATIVE

## 2018-08-23 ENCOUNTER — Other Ambulatory Visit: Payer: Self-pay | Admitting: Nurse Practitioner

## 2018-08-23 MED ORDER — METRONIDAZOLE 500 MG PO TABS: 500.0000 mg | ORAL_TABLET | Freq: Two times a day (BID) | ORAL | 0 refills | Status: DC

## 2018-08-25 ENCOUNTER — Telehealth: Payer: Self-pay

## 2018-08-25 NOTE — Telephone Encounter (Signed)
-----   Message from Claiborne Rigg, NP sent at 08/23/2018 10:42 PM EST ----- Urine showing bacterial vaginosis. Will send prescription to pharmacy

## 2018-08-25 NOTE — Telephone Encounter (Signed)
CMA attempt to reach patient to inform on results.  No answer and left a VM.  

## 2018-08-27 NOTE — Telephone Encounter (Signed)
CMA will send a letter out to reach patient.

## 2018-08-27 NOTE — Telephone Encounter (Signed)
CMA attempt to reach patient to inform on results.  No answer and left a VM.  

## 2018-09-09 ENCOUNTER — Telehealth: Payer: Self-pay | Admitting: Nurse Practitioner

## 2018-09-09 NOTE — Telephone Encounter (Signed)
Pt called back in regards to results please follow up  °

## 2018-09-10 NOTE — Telephone Encounter (Signed)
Patient called back inform on results. Pt. Wasn't able to pick up her Metronidazole, and would like to ask PCP if the Rx can be resent to the [facility] pharmacy.

## 2018-09-10 NOTE — Telephone Encounter (Signed)
Bien please verify from someone in the pharmacy that it was not picked up. The pharmacy should be able to refill it without a new script if it wasn't picked up. Thanks!!!

## 2018-09-11 NOTE — Telephone Encounter (Signed)
CMA attempt to reach patient to inform to contact pharmacy.  No answer and left a VM.

## 2018-09-16 MED FILL — metroNIDAZOLE 500 MG TABS: 500 | 7 days supply | Qty: 14 | Fill #0

## 2018-09-23 ENCOUNTER — Other Ambulatory Visit: Payer: Self-pay | Admitting: Nurse Practitioner

## 2018-10-26 ENCOUNTER — Emergency Department (HOSPITAL_COMMUNITY)
Admission: EM | Admit: 2018-10-26 | Discharge: 2018-10-26 | Disposition: A | Discharge status: Home / Self Care | Payer: Self-pay | Attending: Emergency Medicine | Admitting: Emergency Medicine

## 2018-10-26 ENCOUNTER — Emergency Department (HOSPITAL_COMMUNITY): Payer: Self-pay

## 2018-10-26 ENCOUNTER — Encounter (HOSPITAL_COMMUNITY): Payer: Self-pay

## 2018-10-26 ENCOUNTER — Other Ambulatory Visit: Payer: Self-pay

## 2018-10-26 DIAGNOSIS — E860 Dehydration: Secondary | ICD-10-CM | POA: Insufficient documentation

## 2018-10-26 DIAGNOSIS — R1013 Epigastric pain: Secondary | ICD-10-CM | POA: Insufficient documentation

## 2018-10-26 DIAGNOSIS — F17210 Nicotine dependence, cigarettes, uncomplicated: Secondary | ICD-10-CM | POA: Insufficient documentation

## 2018-10-26 DIAGNOSIS — Z79899 Other long term (current) drug therapy: Secondary | ICD-10-CM | POA: Insufficient documentation

## 2018-10-26 DIAGNOSIS — R1115 Cyclical vomiting syndrome unrelated to migraine: Secondary | ICD-10-CM | POA: Insufficient documentation

## 2018-10-26 DIAGNOSIS — R0602 Shortness of breath: Secondary | ICD-10-CM | POA: Insufficient documentation

## 2018-10-26 DIAGNOSIS — J45909 Unspecified asthma, uncomplicated: Secondary | ICD-10-CM | POA: Insufficient documentation

## 2018-10-26 LAB — URINALYSIS, ROUTINE W REFLEX MICROSCOPIC
Bacteria, UA: NONE SEEN
Bilirubin Urine: NEGATIVE
Glucose, UA: NEGATIVE mg/dL
Ketones, ur: 20 mg/dL — AB
Leukocytes,Ua: NEGATIVE
Nitrite: NEGATIVE
Protein, ur: 100 mg/dL — AB
Specific Gravity, Urine: 1.018 (ref 1.005–1.030)
pH: 6 (ref 5.0–8.0)

## 2018-10-26 LAB — CBC
HCT: 40.6 % (ref 36.0–46.0)
Hemoglobin: 14.2 g/dL (ref 12.0–15.0)
MCH: 31.1 pg (ref 26.0–34.0)
MCHC: 35 g/dL (ref 30.0–36.0)
MCV: 89 fL (ref 80.0–100.0)
Platelets: 231 10*3/uL (ref 150–400)
RBC: 4.56 MIL/uL (ref 3.87–5.11)
RDW: 12.3 % (ref 11.5–15.5)
WBC: 14.5 10*3/uL — ABNORMAL HIGH (ref 4.0–10.5)
nRBC: 0 % (ref 0.0–0.2)

## 2018-10-26 LAB — COMPREHENSIVE METABOLIC PANEL
ALT: 15 U/L (ref 0–44)
AST: 23 U/L (ref 15–41)
Albumin: 4.4 g/dL (ref 3.5–5.0)
Alkaline Phosphatase: 37 U/L — ABNORMAL LOW (ref 38–126)
Anion gap: 14 (ref 5–15)
BUN: 14 mg/dL (ref 6–20)
CO2: 17 mmol/L — ABNORMAL LOW (ref 22–32)
Calcium: 9.1 mg/dL (ref 8.9–10.3)
Chloride: 109 mmol/L (ref 98–111)
Creatinine, Ser: 0.89 mg/dL (ref 0.44–1.00)
GFR calc Af Amer: >60 mL/min (ref >60)
GFR calc non Af Amer: >60 mL/min (ref >60)
Glucose, Bld: 147 mg/dL — ABNORMAL HIGH (ref 70–99)
Potassium: 3.2 mmol/L — ABNORMAL LOW (ref 3.5–5.1)
Sodium: 140 mmol/L (ref 135–145)
Total Bilirubin: 1 mg/dL (ref 0.3–1.2)
Total Protein: 7.9 g/dL (ref 6.5–8.1)

## 2018-10-26 LAB — RAPID URINE DRUG SCREEN, HOSP PERFORMED
Amphetamines: NOT DETECTED
Barbiturates: NOT DETECTED
Benzodiazepines: NOT DETECTED
Cocaine: NOT DETECTED
Opiates: NOT DETECTED
Tetrahydrocannabinol: POSITIVE — AB

## 2018-10-26 LAB — I-STAT BETA HCG BLOOD, ED (MC, WL, AP ONLY): I-stat hCG, quantitative: <5 m[IU]/mL (ref <5)

## 2018-10-26 LAB — LIPASE, BLOOD: Lipase: 26 U/L (ref 11–51)

## 2018-10-26 MED ORDER — SODIUM CHLORIDE 0.9 % IV BOLUS
1000.0000 mL | Freq: Once | INTRAVENOUS | Status: AC
  Administered 2018-10-26: 1000 mL via INTRAVENOUS

## 2018-10-26 MED ORDER — PROMETHAZINE HCL 25 MG PO TABS: 25.0000 mg | ORAL_TABLET | Freq: Four times a day (QID) | ORAL | 0 refills | Status: DC | PRN

## 2018-10-26 MED ORDER — SODIUM CHLORIDE 0.9% FLUSH: 3.0000 mL | Freq: Once | INTRAVENOUS | Status: DC

## 2018-10-26 MED ORDER — DIPHENHYDRAMINE HCL 50 MG/ML IJ SOLN
25.0000 mg | Freq: Once | INTRAMUSCULAR | Status: AC
  Administered 2018-10-26: 25 mg via INTRAVENOUS
  Filled 2018-10-26: qty 1

## 2018-10-26 MED ORDER — HALOPERIDOL LACTATE 5 MG/ML IJ SOLN
5.0000 mg | Freq: Once | INTRAMUSCULAR | Status: AC
  Administered 2018-10-26: 5 mg via INTRAVENOUS
  Filled 2018-10-26: qty 1

## 2018-10-26 MED ORDER — ONDANSETRON HCL 4 MG/2ML IJ SOLN
4.0000 mg | Freq: Once | INTRAMUSCULAR | Status: AC
  Administered 2018-10-26: 4 mg via INTRAVENOUS
  Filled 2018-10-26: qty 2

## 2018-10-26 NOTE — ED Provider Notes (Signed)
[facility]-EMERGENCY DEPT Provider Note   CSN: [id number] Arrival date & time: 10/26/18  6184    History   Chief Complaint Chief Complaint  Patient presents with  . Shortness of Breath  . Emesis    HPI Leslie Yates is a 28 y.o. female.     HPI   28 yo F with h/o obesity, n/v, here with nausea, vomiting. Pt reports that over the past 9 hours, she's had progressively worsening nausea, vomiting. She was in usual state of health last night at dinner. Nothing suspicious/new for dinner. She began having mild nausea at time of bed, then awoke at 1 AM with severe, aching, epigastric pain with nausea and NB, NB emesis. She's been unable to eat/drink since then. She feels SOB 2/2 vomiting. No cough. No fevers. She endorses a h/o frequent n/v. Denies any marijuana use, does smoke tobacco and drinks occasionally. Denies h/o pancreatitis. She is currently on her period, denies any vaginal discharge or pain.  Past Medical History:  Diagnosis Date  . Asthma   . Asthma   . Obesity   . Ovarian cyst   . Sickle cell trait [facility])     Patient Active Problem List   Diagnosis Date Noted  . Complex cyst of right ovary 05/10/2017  . Ovarian cyst, right 11/10/2016  . Nausea with vomiting 11/09/2016    Past Surgical History:  Procedure Laterality Date  . INCISION AND DRAINAGE ABSCESS       OB History    Gravida  0   Para  0   Term  0   Preterm  0   AB  0   Living  0     SAB  0   TAB  0   Ectopic  0   Multiple  0   Live Births  0            Home Medications    Prior to Admission medications   Medication Sig Start Date End Date Taking? Authorizing Provider  albuterol (PROVENTIL HFA;VENTOLIN HFA) 108 (90 Base) MCG/ACT inhaler Inhale 2 puffs into the lungs every 6 (six) hours as needed for wheezing or shortness of breath. 11/26/16   Lizbeth Bark, FNP  chlorhexidine (HIBICLENS) 4 % external liquid Apply topically daily as needed. 08/19/18    Claiborne Rigg, NP  docusate sodium (COLACE) 100 MG capsule Take 100 mg daily as needed by mouth for mild constipation.    [provider]  ibuprofen (ADVIL,MOTRIN) 600 MG tablet Take 1 tablet (600 mg total) by mouth every 6 (six) hours as needed for moderate pain (Take with food.). 10/25/17   Fayrene Helper, PA-C  methocarbamol (ROBAXIN) 500 MG tablet Take 2 tablets (1,000 mg total) by mouth 3 (three) times daily. Patient not taking: Reported on 08/19/2018 05/09/18   Pricilla Loveless, MD  naphazoline-glycerin (CLEAR EYES) 0.012-0.2 % SOLN Place 1-2 drops into both eyes 4 (four) times daily as needed for eye irritation.    [provider]  polyethylene glycol (MIRALAX / GLYCOLAX) packet Take 17 g daily as needed by mouth for mild constipation.    [provider]  promethazine (PHENERGAN) 25 MG tablet Take 1 tablet (25 mg total) by mouth every 6 (six) hours as needed for nausea or vomiting. 10/26/18   Shaune Pollack, MD    Family History Family History  Problem Relation Age of Onset  . Diabetes Mother   . Hypertension Mother   . Diabetes Father   .  Hypertension Father     Social History Social History   Tobacco Use  . Smoking status: Current Every Day Smoker    Packs/day: 1.00    Years: 11.00    Pack years: 11.00    Types: Cigarettes  . Smokeless tobacco: Never Used  Substance Use Topics  . Alcohol use: Yes    Comment: occ  . Drug use: No    Comment: Last used: Last Thursday     Allergies   Penicillins   Review of Systems Review of Systems  Constitutional: Positive for fatigue. Negative for chills and fever.  HENT: Negative for congestion and rhinorrhea.   Eyes: Negative for visual disturbance.  Respiratory: Positive for shortness of breath. Negative for cough and wheezing.   Cardiovascular: Negative for chest pain and leg swelling.  Gastrointestinal: Positive for abdominal pain, nausea and vomiting. Negative for diarrhea.  Genitourinary: Negative  for dysuria and flank pain.  Musculoskeletal: Negative for neck pain and neck stiffness.  Skin: Negative for rash and wound.  Allergic/Immunologic: Negative for immunocompromised state.  Neurological: Negative for syncope, weakness and headaches.  All other systems reviewed and are negative.    Physical Exam Updated Vital Signs BP (!) 142/63   Pulse (!) 55   Temp 98.3 F (36.8 C) (Oral)   Resp (!) 24   Ht 6' (1.829 m)   Wt 136.1 kg   LMP 10/26/2018   SpO2 100%   BMI 40.69 kg/m   Physical Exam Vitals signs and nursing note reviewed.  Constitutional:      General: She is not in acute distress.    Appearance: She is well-developed.     Comments: Gagging, attempting to make herself vomit but no actual vomiting noted in room  HENT:     Head: Normocephalic and atraumatic.  Eyes:     Conjunctiva/sclera: Conjunctivae normal.  Neck:     Musculoskeletal: Neck supple.  Cardiovascular:     Rate and Rhythm: Normal rate and regular rhythm.     Heart sounds: Normal heart sounds. No murmur. No friction rub.  Pulmonary:     Effort: Pulmonary effort is normal. No respiratory distress.     Breath sounds: Normal breath sounds. No wheezing or rales.  Abdominal:     General: There is no distension.     Palpations: Abdomen is soft.     Tenderness: There is no abdominal tenderness.     Comments: Aggressive self-retching limits exam, but no overt abd TTP when pt is distracted  Skin:    General: Skin is warm.     Capillary Refill: Capillary refill takes less than 2 seconds.  Neurological:     Mental Status: She is alert and oriented to person, place, and time.     Motor: No abnormal muscle tone.      ED Treatments / Results  Labs (all labs ordered are listed, but only abnormal results are displayed) Labs Reviewed  COMPREHENSIVE METABOLIC PANEL - Abnormal; Notable for the following components:      Result Value   Potassium 3.2 (*)    CO2 17 (*)    Glucose, Bld 147 (*)     Alkaline Phosphatase 37 (*)    All other components within normal limits  CBC - Abnormal; Notable for the following components:   WBC 14.5 (*)    All other components within normal limits  URINALYSIS, ROUTINE W REFLEX MICROSCOPIC - Abnormal; Notable for the following components:   Hgb urine dipstick SMALL (*)  Ketones, ur 20 (*)    Protein, ur 100 (*)    All other components within normal limits  RAPID URINE DRUG SCREEN, HOSP PERFORMED - Abnormal; Notable for the following components:   Tetrahydrocannabinol POSITIVE (*)    All other components within normal limits  LIPASE, BLOOD  I-STAT BETA HCG BLOOD, ED ([facility], [facility], [facility] ONLY)    EKG EKG Interpretation  Date/Time:  Sunday Oct 26 2018 09:39:41 EDT Ventricular Rate:  62 PR Interval:    QRS Duration: 94 QT Interval:  449 QTC Calculation: 456 R Axis:   53 Text Interpretation:  Sinus arrhythmia Borderline T abnormalities, inferior leads Baseline wander in lead(s) II III aVF No significant change since last tracing Confirmed by Shaune PollackIsaacs, Lucus Lambertson [phone]) on 10/26/2018 9:41:45 AM Also confirmed by Shaune PollackIsaacs, Rufus Beske [phone]), editor Sheppard EvensSimpson, Miranda ([phone])  on 10/26/2018 9:53:53 AM   Radiology Dg Chest 2 View  Result Date: 10/26/2018 CLINICAL DATA:  Dyspnea EXAM: CHEST - 2 VIEW COMPARISON:  05/09/2018 chest radiograph. FINDINGS: Stable cardiomediastinal silhouette with normal heart size. No pneumothorax. No pleural effusion. Lungs appear clear, with no acute consolidative airspace disease and no pulmonary edema. IMPRESSION: No active cardiopulmonary disease. Electronically Signed   By: Delbert PhenixJason A Poff M.D.   On: 10/26/2018 10:02    Procedures Procedures (including critical care time)  Medications Ordered in ED Medications  sodium chloride flush (NS) 0.9 % injection 3 mL (3 mLs Intravenous Not Given 10/26/18 1022)  haloperidol lactate (HALDOL) injection 5 mg (5 mg Intravenous Given 10/26/18 1021)  diphenhydrAMINE (BENADRYL) injection 25 mg (25 mg  Intravenous Given 10/26/18 1022)  sodium chloride 0.9 % bolus 1,000 mL (0 mLs Intravenous Stopped 10/26/18 1128)  sodium chloride 0.9 % bolus 1,000 mL (1,000 mLs Intravenous New Bag/Given (Non-Interop) 10/26/18 1126)  sodium chloride 0.9 % bolus 1,000 mL (0 mLs Intravenous Stopped 10/26/18 1230)  ondansetron (ZOFRAN) injection 4 mg (4 mg Intravenous Given 10/26/18 1126)     Initial Impression / Assessment and Plan / ED Course  I have reviewed the triage vital signs and the nursing notes.  Pertinent labs & imaging results that were available during my care of the patient were reviewed by me and considered in my medical decision making (see chart for details).  Clinical Course as of Oct 26 1246  Wynelle LinkSun Oct 26, 2018  0951 28 yo F here w/ suspected cyclical vomiting, possible CHS though denies MJ use (although all UDS from prior visits +MJ). No focal TTP to suggest appendicitis, cholecystitis. Will f/u labs. I suspect her SOB is 2/2 vomiting, and her self-retching. CXR ordered in triage - will f/u. She is not hypoxic.   [CI]  1006 Reviewed, unremarkable. Suspect her SOB is 2/2 her nausea/vomiting.  DG Chest 2 View [CI]  1024 EKG reviewed.  No acute changes.  QTC less than 460.   [CI]  1039 Likely reactive secondary to vomiting.  No fever.  No focal tenderness to suggest appendicitis or cholecystitis.  Will follow-up LFTs and bilirubin.  CBC(!) [CI]  1044 I-Stat beta hCG blood, ED [CI]  1046 Patient resting comfortably.   [CI]  1219 Labs c/w mild dehydration. Potassium given, fluids given. Normal LFTs, AlkP, Bili - doubt cholecystitis.    [CI]  1221 UDS positive for marijuana.  Suspect cannabis hyperemesis clinically.  Rapid urine drug screen (hospital performed)(!) [CI]  1246 Patient is feeling much better after meds and fluids.  She is tolerating p.o. and feels comfortable and ready for discharge.  Will have her  avoid marijuana, prescribed a brief course of antiemetics, and discharged home.   [CI]     Clinical Course User Index [CI] Shaune Pollack, MD        Final Clinical Impressions(s) / ED Diagnoses   Final diagnoses:  Cyclical vomiting  Dehydration    ED Discharge Orders         Ordered    promethazine (PHENERGAN) 25 MG tablet  Every 6 hours PRN     10/26/18 1247           Shaune Pollack, MD 10/26/18 1248

## 2018-10-26 NOTE — ED Notes (Signed)
Bed: WA23 Expected date:  Expected time:  Means of arrival:  Comments: Hold for triage 

## 2018-10-26 NOTE — ED Notes (Signed)
Patient tolerating PO fluids. MD made aware.

## 2018-10-26 NOTE — ED Triage Notes (Signed)
Patient c/o abdominal pain, SOB, and emesis since 0100 this AM  Patient stuck her fingers down her throat during triage to make herself throw up and stated, "the vomit is stuck in my throat."

## 2018-10-26 NOTE — ED Notes (Signed)
Patient given sprite.

## 2018-10-26 NOTE — Discharge Instructions (Addendum)
Take the Phenergan as prescribed.  Avoid smoking marijuana at all.  Drink plenty of fluids.  Try a warm shower to help with nausea if it returns and is not improved with Phenergan, or in addition to taking the Phenergan.

## 2018-10-27 ENCOUNTER — Other Ambulatory Visit: Payer: Self-pay

## 2018-10-27 ENCOUNTER — Observation Stay (HOSPITAL_COMMUNITY)
Admission: EM | Admit: 2018-10-27 | Discharge: 2018-10-27 | Disposition: A | Discharge status: Home / Self Care | Payer: Self-pay | Attending: Internal Medicine | Admitting: Internal Medicine

## 2018-10-27 ENCOUNTER — Encounter (HOSPITAL_COMMUNITY): Payer: Self-pay | Admitting: Emergency Medicine

## 2018-10-27 DIAGNOSIS — E876 Hypokalemia: Secondary | ICD-10-CM | POA: Insufficient documentation

## 2018-10-27 DIAGNOSIS — R1115 Cyclical vomiting syndrome unrelated to migraine: Principal | ICD-10-CM | POA: Insufficient documentation

## 2018-10-27 DIAGNOSIS — E669 Obesity, unspecified: Secondary | ICD-10-CM | POA: Insufficient documentation

## 2018-10-27 DIAGNOSIS — E860 Dehydration: Secondary | ICD-10-CM | POA: Insufficient documentation

## 2018-10-27 DIAGNOSIS — Z6841 Body Mass Index (BMI) 40.0 and over, adult: Secondary | ICD-10-CM | POA: Insufficient documentation

## 2018-10-27 DIAGNOSIS — Z8249 Family history of ischemic heart disease and other diseases of the circulatory system: Secondary | ICD-10-CM | POA: Insufficient documentation

## 2018-10-27 DIAGNOSIS — D573 Sickle-cell trait: Secondary | ICD-10-CM | POA: Insufficient documentation

## 2018-10-27 DIAGNOSIS — F17210 Nicotine dependence, cigarettes, uncomplicated: Secondary | ICD-10-CM | POA: Insufficient documentation

## 2018-10-27 DIAGNOSIS — R109 Unspecified abdominal pain: Secondary | ICD-10-CM | POA: Insufficient documentation

## 2018-10-27 DIAGNOSIS — J45909 Unspecified asthma, uncomplicated: Secondary | ICD-10-CM | POA: Insufficient documentation

## 2018-10-27 HISTORY — DX: Cyclical vomiting syndrome unrelated to migraine: R11.15

## 2018-10-27 LAB — COMPREHENSIVE METABOLIC PANEL
ALT: 15 U/L (ref 0–44)
AST: 20 U/L (ref 15–41)
Albumin: 4.4 g/dL (ref 3.5–5.0)
Alkaline Phosphatase: 36 U/L — ABNORMAL LOW (ref 38–126)
Anion gap: 6 (ref 5–15)
BUN: 12 mg/dL (ref 6–20)
CO2: 23 mmol/L (ref 22–32)
Calcium: 8.5 mg/dL — ABNORMAL LOW (ref 8.9–10.3)
Chloride: 110 mmol/L (ref 98–111)
Creatinine, Ser: 0.85 mg/dL (ref 0.44–1.00)
GFR calc Af Amer: >60 mL/min (ref >60)
GFR calc non Af Amer: >60 mL/min (ref >60)
Glucose, Bld: 116 mg/dL — ABNORMAL HIGH (ref 70–99)
Potassium: 3.1 mmol/L — ABNORMAL LOW (ref 3.5–5.1)
Sodium: 139 mmol/L (ref 135–145)
Total Bilirubin: 1.3 mg/dL — ABNORMAL HIGH (ref 0.3–1.2)
Total Protein: 7.6 g/dL (ref 6.5–8.1)

## 2018-10-27 LAB — CBC WITH DIFFERENTIAL/PLATELET
Abs Immature Granulocytes: 0.05 10*3/uL (ref 0.00–0.07)
Basophils Absolute: 0 10*3/uL (ref 0.0–0.1)
Basophils Relative: 0 %
Eosinophils Absolute: 0 10*3/uL (ref 0.0–0.5)
Eosinophils Relative: 0 %
HCT: 39.8 % (ref 36.0–46.0)
Hemoglobin: 13.5 g/dL (ref 12.0–15.0)
Immature Granulocytes: 1 %
Lymphocytes Relative: 9 %
Lymphs Abs: 1 10*3/uL (ref 0.7–4.0)
MCH: 31 pg (ref 26.0–34.0)
MCHC: 33.9 g/dL (ref 30.0–36.0)
MCV: 91.3 fL (ref 80.0–100.0)
Monocytes Absolute: 0.3 10*3/uL (ref 0.1–1.0)
Monocytes Relative: 3 %
Neutro Abs: 9.4 10*3/uL — ABNORMAL HIGH (ref 1.7–7.7)
Neutrophils Relative %: 87 %
Platelets: 199 10*3/uL (ref 150–400)
RBC: 4.36 MIL/uL (ref 3.87–5.11)
RDW: 12.4 % (ref 11.5–15.5)
WBC: 10.8 10*3/uL — ABNORMAL HIGH (ref 4.0–10.5)
nRBC: 0 % (ref 0.0–0.2)

## 2018-10-27 LAB — I-STAT BETA HCG BLOOD, ED (MC, WL, AP ONLY): I-stat hCG, quantitative: <5 m[IU]/mL (ref <5)

## 2018-10-27 LAB — LIPASE, BLOOD: Lipase: 24 U/L (ref 11–51)

## 2018-10-27 MED ORDER — POTASSIUM CHLORIDE 10 MEQ/100ML IV SOLN
10.0000 meq | INTRAVENOUS | Status: DC
  Administered 2018-10-27 (×2): 10 meq via INTRAVENOUS
  Filled 2018-10-27 (×2): qty 100

## 2018-10-27 MED ORDER — DROPERIDOL 2.5 MG/ML IJ SOLN
1.2500 mg | Freq: Once | INTRAMUSCULAR | Status: AC
  Administered 2018-10-27: 17:00:00 1.25 mg via INTRAVENOUS
  Filled 2018-10-27: qty 2

## 2018-10-27 MED ORDER — PROMETHAZINE HCL 25 MG RE SUPP: 25.0000 mg | Freq: Four times a day (QID) | RECTAL | 0 refills | Status: DC | PRN

## 2018-10-27 MED ORDER — SODIUM CHLORIDE 0.9 % IV BOLUS
1000.0000 mL | Freq: Once | INTRAVENOUS | Status: AC
  Administered 2018-10-27: 1000 mL via INTRAVENOUS

## 2018-10-27 MED ORDER — POTASSIUM CHLORIDE ER 10 MEQ PO TBCR: 40.0000 meq | EXTENDED_RELEASE_TABLET | Freq: Every day | ORAL | 0 refills | Status: DC

## 2018-10-27 MED ORDER — DIPHENHYDRAMINE HCL 50 MG/ML IJ SOLN
25.0000 mg | Freq: Once | INTRAMUSCULAR | Status: AC
  Administered 2018-10-27: 25 mg via INTRAVENOUS
  Filled 2018-10-27: qty 1

## 2018-10-27 MED FILL — PROMETHAZINE 25 MG TABLET: 25 | 4 days supply | Qty: 15 | Fill #0

## 2018-10-27 NOTE — Discharge Instructions (Signed)
You have been seen in the Emergency Department (ED) today for nausea and vomiting.  Your work up today has not shown a clear cause for your symptoms. You have been prescribed Phenergan; please use as prescribed as needed for your nausea.  Follow up with your doctor as soon as possible regarding today's emergent visit and your symptoms of nausea.   Return to the ED if you develop abdominal, bloody vomiting, bloody diarrhea, if you are unable to tolerate fluids due to vomiting, or if you develop other symptoms that concern you.  

## 2018-10-27 NOTE — ED Triage Notes (Signed)
Patient arrived by EMS from home. Pt c/o ABD pain.  Patient vomited x 2 per EMS. Patient continues to make herself vomit because she states it makes herself feel better per EMS.   EMS gave 4 mg Zofran. EMS gave 100 mcg Fentanyl per EMS.   EMS VS CBG 128, BP 120/60, HR 42, T 97.0.   Hx of ovarian cyst.

## 2018-10-27 NOTE — ED Notes (Signed)
EKG given to Long MD 

## 2018-10-27 NOTE — ED Provider Notes (Signed)
Emergency Department Provider Note   I have reviewed the triage vital signs and the nursing notes.   HISTORY  Chief Complaint Emesis   HPI Leslie Yates is a 28 y.o. female with PMH of asthma and cyclical vomiting returns to the emergency department with vomiting and diffuse abdominal cramping pain.  Patient was seen in the emergency department yesterday with similar symptoms.  She describes episodic vomiting similar to this over the past 2 years.  She denies any fevers or chills.  She states she feels a similar to her prior episodes of cyclical vomiting.  Her abdominal pain also feels similar that it is cramping and diffuse.  No focal or unilateral pain.  Denies any UTI symptoms.  No blood in the emesis.  No shortness of breath.  She denies drug use to me including marijuana but did test positive for marijuana on her urine tox yesterday.  She tried taking her Phenergan but states she vomited after taking it. No radiation of symptoms or modifying factors.   Past Medical History:  Diagnosis Date  . Asthma   . Asthma   . Obesity   . Ovarian cyst   . Sickle cell trait [facility])     Patient Active Problem List   Diagnosis Date Noted  . Complex cyst of right ovary 05/10/2017  . Ovarian cyst, right 11/10/2016  . Nausea with vomiting 11/09/2016    Past Surgical History:  Procedure Laterality Date  . INCISION AND DRAINAGE ABSCESS      Allergies Penicillins  Family History  Problem Relation Age of Onset  . Diabetes Mother   . Hypertension Mother   . Diabetes Father   . Hypertension Father     Social History Social History   Tobacco Use  . Smoking status: Current Every Day Smoker    Packs/day: 1.00    Years: 11.00    Pack years: 11.00    Types: Cigarettes  . Smokeless tobacco: Never Used  Substance Use Topics  . Alcohol use: Yes    Comment: occ  . Drug use: Yes    Types: Benzodiazepines    Comment: Last used: Last Thursday    Review of Systems  Constitutional:  No fever/chills Eyes: No visual changes. ENT: No sore throat. Cardiovascular: Denies chest pain. Respiratory: Denies shortness of breath. Gastrointestinal: Positive diffuse cramping abdominal pain. Positive nausea/ vomiting.  No diarrhea.  No constipation. Genitourinary: Negative for dysuria. Musculoskeletal: Negative for back pain. Skin: Negative for rash. Neurological: Negative for headaches, focal weakness or numbness.  10-point ROS otherwise negative.  ____________________________________________   PHYSICAL EXAM:  VITAL SIGNS: ED Triage Vitals [10/27/18 1616]  Enc Vitals Group     BP (!) 173/145     Pulse Rate (!) 45     Resp 13     Temp 98.5 F (36.9 C)     Temp Source Oral     SpO2 100 %     Weight 300 lb (136.1 kg)     Height 6' (1.829 m)     Pain Score 10   Constitutional: Alert and oriented. Well appearing and in no acute distress. Eyes: Conjunctivae are normal. PERRL. EOMI. Head: Atraumatic. Nose: No congestion/rhinnorhea. Mouth/Throat: Mucous membranes are moist.  Neck: No stridor.   Cardiovascular: Bradycardia. Good peripheral circulation. Grossly normal heart sounds.   Respiratory: Normal respiratory effort.  Gastrointestinal: Soft and nontender. No distention.  Musculoskeletal: No lower extremity tenderness nor edema. No gross deformities of extremities. Neurologic:  Normal speech and  language. No gross focal neurologic deficits are appreciated.  Skin:  Skin is warm, dry and intact. No rash noted.  ____________________________________________   LABS (all labs ordered are listed, but only abnormal results are displayed)  Labs Reviewed  COMPREHENSIVE METABOLIC PANEL - Abnormal; Notable for the following components:      Result Value   Potassium 3.1 (*)    Glucose, Bld 116 (*)    Calcium 8.5 (*)    Alkaline Phosphatase 36 (*)    Total Bilirubin 1.3 (*)    All other components within normal limits  CBC WITH DIFFERENTIAL/PLATELET - Abnormal; Notable  for the following components:   WBC 10.8 (*)    Neutro Abs 9.4 (*)    All other components within normal limits  LIPASE, BLOOD  I-STAT BETA HCG BLOOD, ED ([facility], [facility], [facility] ONLY)   ____________________________________________  EKG   EKG Interpretation  Date/Time:  Monday Oct 27 2018 17:52:10 EDT Ventricular Rate:  64 PR Interval:    QRS Duration: 93 QT Interval:  434 QTC Calculation: 448 R Axis:   74 Text Interpretation:  Sinus arrhythmia Low voltage, precordial leads No STEMI.  Confirmed by Alona Bene [id number]) on 10/27/2018 6:02:38 PM       ____________________________________________  RADIOLOGY  None  ____________________________________________   PROCEDURES  Procedure(s) performed:   Procedures  CRITICAL CARE Performed by: Maia Plan Total critical care time: 35 minutes Critical care time was exclusive of separately billable procedures and treating other patients. Critical care was necessary to treat or prevent imminent or life-threatening deterioration. Critical care was time spent personally by me on the following activities: development of treatment plan with patient and/or surrogate as well as nursing, discussions with consultants, evaluation of patient's response to treatment, examination of patient, obtaining history from patient or surrogate, ordering and performing treatments and interventions, ordering and review of laboratory studies, ordering and review of radiographic studies, pulse oximetry and re-evaluation of patient's condition.  Alona Bene, MD Emergency Medicine  ____________________________________________   INITIAL IMPRESSION / ASSESSMENT AND PLAN / ED COURSE  Pertinent labs & imaging results that were available during my care of the patient were reviewed by me and considered in my medical decision making (see chart for details).   Patient returns to the emergency department today with symptoms typical of her cyclical vomiting syndrome.  No  focal tenderness on abdominal exam.  EKG from yesterday showed normal QTC.  Plan to repeat labs, IV fluids.  Will give droperidol and Benadryl. No imaging at this time with symptoms typical of her cyclical vomiting and non-focal abdominal exam.   06:20 PM  Reevaluated patient.  She is doing somewhat better but continues to have significant nausea.  Abdominal pain has mostly resolved although does have some residual cramping.  Patient continues to have hypokalemia.  IV potassium ordered and is running.  Patient was seen yesterday and discharged home only to bounce back today.  She states that she is feeling slightly better but does not think she will be well enough to return home today.  Plan for admit for IV fluids, continued nausea management, reassess.  QTC today is 448.  Discussed patient's case with Hospitalist, Dr. Allena Katz to request admission. Patient and family (if present) updated with plan. Care transferred to Hospitalist service.  I reviewed all nursing notes, vitals, pertinent old records, EKGs, labs, imaging (as available).  06:55 PM  Called to patient's room.  She is now requesting discharge.  She states that she is actually  feeling better despite our conversation 30 minutes ago.  She is up and ambulatory to the bathroom.  Will PO challenge and reassess for discharge.   Patient tolerating PO and would like to leave. Patient discharged with PR Phenergan and PO potassium. Discussed ED return precautions.  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  Alona BeneJoshua , MD Emergency Medicine    , Arlyss RepressJoshua G, MD 10/27/18 2159

## 2018-10-27 NOTE — ED Notes (Signed)
Pt has been drinking soda with out any N/V.

## 2018-10-27 NOTE — ED Notes (Signed)
Bed: BT66 Expected date:  Expected time:  Means of arrival:  Comments: EMS- 28 yo female abd pains

## 2018-10-28 ENCOUNTER — Observation Stay (HOSPITAL_COMMUNITY)
Admission: EM | Admit: 2018-10-28 | Discharge: 2018-10-29 | Disposition: A | Discharge status: Home / Self Care | Payer: Self-pay | Attending: Internal Medicine | Admitting: Internal Medicine

## 2018-10-28 ENCOUNTER — Emergency Department (HOSPITAL_COMMUNITY): Payer: Self-pay

## 2018-10-28 ENCOUNTER — Other Ambulatory Visit: Payer: Self-pay

## 2018-10-28 ENCOUNTER — Encounter (HOSPITAL_COMMUNITY): Payer: Self-pay

## 2018-10-28 DIAGNOSIS — Z6841 Body Mass Index (BMI) 40.0 and over, adult: Secondary | ICD-10-CM | POA: Insufficient documentation

## 2018-10-28 DIAGNOSIS — R1030 Lower abdominal pain, unspecified: Secondary | ICD-10-CM | POA: Insufficient documentation

## 2018-10-28 DIAGNOSIS — E876 Hypokalemia: Secondary | ICD-10-CM

## 2018-10-28 DIAGNOSIS — D573 Sickle-cell trait: Secondary | ICD-10-CM | POA: Insufficient documentation

## 2018-10-28 DIAGNOSIS — Z791 Long term (current) use of non-steroidal anti-inflammatories (NSAID): Secondary | ICD-10-CM | POA: Insufficient documentation

## 2018-10-28 DIAGNOSIS — Z8249 Family history of ischemic heart disease and other diseases of the circulatory system: Secondary | ICD-10-CM | POA: Insufficient documentation

## 2018-10-28 DIAGNOSIS — Z7951 Long term (current) use of inhaled steroids: Secondary | ICD-10-CM | POA: Insufficient documentation

## 2018-10-28 DIAGNOSIS — Z79899 Other long term (current) drug therapy: Secondary | ICD-10-CM | POA: Insufficient documentation

## 2018-10-28 DIAGNOSIS — F17210 Nicotine dependence, cigarettes, uncomplicated: Secondary | ICD-10-CM | POA: Insufficient documentation

## 2018-10-28 DIAGNOSIS — J45909 Unspecified asthma, uncomplicated: Secondary | ICD-10-CM | POA: Insufficient documentation

## 2018-10-28 DIAGNOSIS — K439 Ventral hernia without obstruction or gangrene: Secondary | ICD-10-CM | POA: Insufficient documentation

## 2018-10-28 DIAGNOSIS — R1115 Cyclical vomiting syndrome unrelated to migraine: Principal | ICD-10-CM | POA: Diagnosis present

## 2018-10-28 DIAGNOSIS — Z720 Tobacco use: Secondary | ICD-10-CM

## 2018-10-28 LAB — COMPREHENSIVE METABOLIC PANEL
ALT: 17 U/L (ref 0–44)
AST: 21 U/L (ref 15–41)
Albumin: 4.2 g/dL (ref 3.5–5.0)
Alkaline Phosphatase: 34 U/L — ABNORMAL LOW (ref 38–126)
Anion gap: 8 (ref 5–15)
BUN: 9 mg/dL (ref 6–20)
CO2: 24 mmol/L (ref 22–32)
Calcium: 8.7 mg/dL — ABNORMAL LOW (ref 8.9–10.3)
Chloride: 108 mmol/L (ref 98–111)
Creatinine, Ser: 0.76 mg/dL (ref 0.44–1.00)
GFR calc Af Amer: >60 mL/min (ref >60)
GFR calc non Af Amer: >60 mL/min (ref >60)
Glucose, Bld: 118 mg/dL — ABNORMAL HIGH (ref 70–99)
Potassium: 2.9 mmol/L — ABNORMAL LOW (ref 3.5–5.1)
Sodium: 140 mmol/L (ref 135–145)
Total Bilirubin: 1.4 mg/dL — ABNORMAL HIGH (ref 0.3–1.2)
Total Protein: 7.3 g/dL (ref 6.5–8.1)

## 2018-10-28 LAB — URINALYSIS, ROUTINE W REFLEX MICROSCOPIC
Bilirubin Urine: NEGATIVE
Glucose, UA: NEGATIVE mg/dL
Ketones, ur: NEGATIVE mg/dL
Leukocytes,Ua: NEGATIVE
Nitrite: NEGATIVE
Protein, ur: 30 mg/dL — AB
Specific Gravity, Urine: 1.017 (ref 1.005–1.030)
pH: 7 (ref 5.0–8.0)

## 2018-10-28 LAB — CBC WITH DIFFERENTIAL/PLATELET
Abs Immature Granulocytes: 0.06 10*3/uL (ref 0.00–0.07)
Basophils Absolute: 0 10*3/uL (ref 0.0–0.1)
Basophils Relative: 0 %
Eosinophils Absolute: 0 10*3/uL (ref 0.0–0.5)
Eosinophils Relative: 0 %
HCT: 36.1 % (ref 36.0–46.0)
Hemoglobin: 12.7 g/dL (ref 12.0–15.0)
Immature Granulocytes: 0 %
Lymphocytes Relative: 9 %
Lymphs Abs: 1.3 10*3/uL (ref 0.7–4.0)
MCH: 31.5 pg (ref 26.0–34.0)
MCHC: 35.2 g/dL (ref 30.0–36.0)
MCV: 89.6 fL (ref 80.0–100.0)
Monocytes Absolute: 0.8 10*3/uL (ref 0.1–1.0)
Monocytes Relative: 6 %
Neutro Abs: 11.6 10*3/uL — ABNORMAL HIGH (ref 1.7–7.7)
Neutrophils Relative %: 85 %
Platelets: 220 10*3/uL (ref 150–400)
RBC: 4.03 MIL/uL (ref 3.87–5.11)
RDW: 12.4 % (ref 11.5–15.5)
WBC: 13.8 10*3/uL — ABNORMAL HIGH (ref 4.0–10.5)
nRBC: 0 % (ref 0.0–0.2)

## 2018-10-28 LAB — MAGNESIUM: Magnesium: 2 mg/dL (ref 1.7–2.4)

## 2018-10-28 MED ORDER — IOHEXOL 350 MG/ML SOLN: 100.0000 mL | Freq: Once | INTRAVENOUS | Status: DC | PRN

## 2018-10-28 MED ORDER — POTASSIUM CHLORIDE 10 MEQ/100ML IV SOLN
10.0000 meq | INTRAVENOUS | Status: AC
  Administered 2018-10-28 (×2): 10 meq via INTRAVENOUS
  Filled 2018-10-28 (×2): qty 100

## 2018-10-28 MED ORDER — ONDANSETRON 4 MG PO TBDP: 4.0000 mg | ORAL_TABLET | Freq: Three times a day (TID) | ORAL | 0 refills | Status: DC | PRN

## 2018-10-28 MED ORDER — BISACODYL 5 MG PO TBEC: 5.0000 mg | DELAYED_RELEASE_TABLET | Freq: Every day | ORAL | Status: DC | PRN

## 2018-10-28 MED ORDER — SENNOSIDES-DOCUSATE SODIUM 8.6-50 MG PO TABS: 2.0000 | ORAL_TABLET | Freq: Every evening | ORAL | Status: DC | PRN

## 2018-10-28 MED ORDER — POTASSIUM CHLORIDE CRYS ER 20 MEQ PO TBCR
40.0000 meq | EXTENDED_RELEASE_TABLET | Freq: Once | ORAL | Status: DC
  Filled 2018-10-28: qty 2

## 2018-10-28 MED ORDER — CAPSAICIN 0.075 % EX CREA
TOPICAL_CREAM | Freq: Two times a day (BID) | CUTANEOUS | Status: DC
  Filled 2018-10-28: qty 60

## 2018-10-28 MED ORDER — METOCLOPRAMIDE HCL 5 MG/ML IJ SOLN
10.0000 mg | Freq: Once | INTRAMUSCULAR | Status: AC
  Administered 2018-10-28: 10 mg via INTRAVENOUS
  Filled 2018-10-28: qty 2

## 2018-10-28 MED ORDER — ACETAMINOPHEN 325 MG PO TABS: 650.0000 mg | ORAL_TABLET | Freq: Four times a day (QID) | ORAL | Status: DC | PRN

## 2018-10-28 MED ORDER — POTASSIUM CHLORIDE 10 MEQ/100ML IV SOLN
10.0000 meq | INTRAVENOUS | Status: AC
  Administered 2018-10-28 (×4): 10 meq via INTRAVENOUS
  Filled 2018-10-28 (×4): qty 100

## 2018-10-28 MED ORDER — SODIUM CHLORIDE (PF) 0.9 % IJ SOLN
INTRAMUSCULAR | Status: AC
  Filled 2018-10-28: qty 50

## 2018-10-28 MED ORDER — ONDANSETRON HCL 4 MG PO TABS: 4.0000 mg | ORAL_TABLET | Freq: Four times a day (QID) | ORAL | Status: DC | PRN

## 2018-10-28 MED ORDER — IPRATROPIUM-ALBUTEROL 0.5-2.5 (3) MG/3ML IN SOLN: 3.0000 mL | RESPIRATORY_TRACT | Status: DC | PRN

## 2018-10-28 MED ORDER — SENNOSIDES-DOCUSATE SODIUM 8.6-50 MG PO TABS: 1.0000 | ORAL_TABLET | Freq: Every evening | ORAL | Status: DC | PRN

## 2018-10-28 MED ORDER — CAPSAICIN 0.025 % EX CREA
TOPICAL_CREAM | Freq: Two times a day (BID) | CUTANEOUS | Status: DC
  Administered 2018-10-28: 14:00:00 via TOPICAL
  Filled 2018-10-28: qty 60

## 2018-10-28 MED ORDER — PROMETHAZINE HCL 25 MG/ML IJ SOLN
25.0000 mg | Freq: Once | INTRAMUSCULAR | Status: AC
  Administered 2018-10-28: 25 mg via INTRAVENOUS
  Filled 2018-10-28: qty 1

## 2018-10-28 MED ORDER — ONDANSETRON HCL 4 MG/2ML IJ SOLN
4.0000 mg | Freq: Four times a day (QID) | INTRAMUSCULAR | Status: DC | PRN
  Administered 2018-10-28: 4 mg via INTRAVENOUS
  Filled 2018-10-28: qty 2

## 2018-10-28 MED ORDER — IOHEXOL 300 MG/ML  SOLN
100.0000 mL | Freq: Once | INTRAMUSCULAR | Status: AC | PRN
  Administered 2018-10-28: 100 mL via INTRAVENOUS

## 2018-10-28 MED ORDER — MAGNESIUM SULFATE 2 GM/50ML IV SOLN
2.0000 g | Freq: Once | INTRAVENOUS | Status: AC
  Administered 2018-10-28: 2 g via INTRAVENOUS
  Filled 2018-10-28: qty 50

## 2018-10-28 MED ORDER — PANTOPRAZOLE SODIUM 40 MG PO TBEC
40.0000 mg | DELAYED_RELEASE_TABLET | Freq: Every day | ORAL | Status: DC
  Administered 2018-10-28 – 2018-10-29 (×2): 40 mg via ORAL
  Filled 2018-10-28 (×3): qty 1

## 2018-10-28 MED ORDER — DROPERIDOL 2.5 MG/ML IJ SOLN
2.5000 mg | Freq: Once | INTRAMUSCULAR | Status: AC
  Administered 2018-10-28: 2.5 mg via INTRAVENOUS
  Filled 2018-10-28: qty 2

## 2018-10-28 MED ORDER — SODIUM CHLORIDE 0.9 % IV BOLUS
500.0000 mL | Freq: Once | INTRAVENOUS | Status: AC
  Administered 2018-10-28: 500 mL via INTRAVENOUS

## 2018-10-28 MED ORDER — SODIUM CHLORIDE 0.9 % IV SOLN
INTRAVENOUS | Status: AC
  Administered 2018-10-28 – 2018-10-29 (×3): via INTRAVENOUS

## 2018-10-28 MED ORDER — POLYETHYLENE GLYCOL 3350 17 G PO PACK: 17.0000 g | PACK | Freq: Every day | ORAL | Status: DC | PRN

## 2018-10-28 MED ORDER — HYDRALAZINE HCL 20 MG/ML IJ SOLN: 10.0000 mg | INTRAMUSCULAR | Status: DC | PRN

## 2018-10-28 MED ORDER — METOCLOPRAMIDE HCL 5 MG/ML IJ SOLN
10.0000 mg | Freq: Three times a day (TID) | INTRAMUSCULAR | Status: DC
  Administered 2018-10-28 – 2018-10-29 (×3): 10 mg via INTRAVENOUS
  Filled 2018-10-28 (×3): qty 2

## 2018-10-28 MED ORDER — POTASSIUM CHLORIDE 10 MEQ/100ML IV SOLN: 10.0000 meq | INTRAVENOUS | Status: DC

## 2018-10-28 MED ORDER — ACETAMINOPHEN 650 MG RE SUPP: 650.0000 mg | Freq: Four times a day (QID) | RECTAL | Status: DC | PRN

## 2018-10-28 MED FILL — POTASSIUM CHLORIDE ER 10 ME: 10 | 5 days supply | Qty: 20 | Fill #0

## 2018-10-28 MED FILL — PHENADOZ 25 MG SUPP: 25 | 3 days supply | Qty: 12 | Fill #0

## 2018-10-28 NOTE — ED Provider Notes (Signed)
[facility]-EMERGENCY DEPT Provider Note   CSN: [id number] Arrival date & time: 10/28/18  1107    History   Chief Complaint Chief Complaint  Patient presents with  . Nausea  . Emesis    HPI Leslie Yates is a 28 y.o. female presenting for evaluation nausea, vomiting, abdominal pain.  Patient states she has been having persistent nausea and vomiting for the past 3 days.  She has been seen every day for the past 3 days.  She states she has had continued vomiting, despite oral Phenergan and has not been able to afford the Phenergan suppository.  She has been up multiple times today.  She also reports lower abdominal pain and some loose stools, 2-3 stools a day.  She denies fevers, chills, chest pain, cough, shortness of breath, urinary symptoms.  Denies hematemesis or blood in her stool.  She denies vaginal discharge.  Patient states her girlfriend about just before her symptoms began, but nobody else at home is sick.  She denies recent travel.  Patient states she used marijuana at a party several days ago, but has not used any since.  She reports tobacco use daily, occasional/social alcohol use, and no other drug use.  Additional history obtained from chart review.  Visits from yesterday and day before reviewed.  Per yesterday's notes, patient was recommended to be admitted due to quick return to the ER with symptoms, however patient felt better at that time and want to be discharged.     HPI  Past Medical History:  Diagnosis Date  . Asthma   . Asthma   . Obesity   . Ovarian cyst   . Sickle cell trait [facility](HCC)     Patient Active Problem List   Diagnosis Date Noted  . Cyclical vomiting, intractable 10/28/2018  . Cyclical vomiting with nausea 10/27/2018  . Asthma 10/27/2018  . Complex cyst of right ovary 05/10/2017  . Ovarian cyst, right 11/10/2016  . Nausea with vomiting 11/09/2016    Past Surgical History:  Procedure Laterality Date  . INCISION AND  DRAINAGE ABSCESS       OB History    Gravida  0   Para  0   Term  0   Preterm  0   AB  0   Living  0     SAB  0   TAB  0   Ectopic  0   Multiple  0   Live Births  0            Home Medications    Prior to Admission medications   Medication Sig Start Date End Date Taking? Authorizing Provider  acetaminophen (TYLENOL) 325 MG tablet Take 650 mg by mouth every 6 (six) hours as needed for mild pain or headache.   Yes [provider]  promethazine (PHENERGAN) 25 MG tablet Take 1 tablet (25 mg total) by mouth every 6 (six) hours as needed for nausea or vomiting. 10/26/18  Yes Shaune PollackIsaacs, Cameron, MD  albuterol (PROVENTIL HFA;VENTOLIN HFA) 108 (90 Base) MCG/ACT inhaler Inhale 2 puffs into the lungs every 6 (six) hours as needed for wheezing or shortness of breath. Patient not taking: Reported on 10/27/2018 11/26/16   Lizbeth BarkHairston, Mandesia R, FNP  chlorhexidine (HIBICLENS) 4 % external liquid Apply topically daily as needed. Patient not taking: Reported on 10/27/2018 08/19/18   Claiborne RiggFleming, Zelda W, NP  ibuprofen (ADVIL,MOTRIN) 600 MG tablet Take 1 tablet (600 mg total) by mouth every 6 (six) hours as needed  for moderate pain (Take with food.). Patient not taking: Reported on 10/27/2018 10/25/17   Fayrene Helper, PA-C  methocarbamol (ROBAXIN) 500 MG tablet Take 2 tablets (1,000 mg total) by mouth 3 (three) times daily. Patient not taking: Reported on 08/19/2018 05/09/18   Pricilla Loveless, MD  ondansetron (ZOFRAN ODT) 4 MG disintegrating tablet Take 1 tablet (4 mg total) by mouth every 8 (eight) hours as needed for nausea or vomiting. 10/28/18   Sumedh Shinsato, PA-C  potassium chloride (K-DUR) 10 MEQ tablet Take 4 tablets (40 mEq total) by mouth daily for 5 days. 10/27/18 11/01/18  Long, Arlyss Repress, MD  promethazine (PHENERGAN) 25 MG suppository Place 1 suppository (25 mg total) rectally every 6 (six) hours as needed for nausea or vomiting. 10/27/18   Long, Arlyss Repress, MD    Family History Family  History  Problem Relation Age of Onset  . Diabetes Mother   . Hypertension Mother   . Diabetes Father   . Hypertension Father     Social History Social History   Tobacco Use  . Smoking status: Current Every Day Smoker    Packs/day: 1.00    Years: 11.00    Pack years: 11.00    Types: Cigarettes  . Smokeless tobacco: Never Used  Substance Use Topics  . Alcohol use: Yes    Comment: occ  . Drug use: Yes    Types: Benzodiazepines    Comment: Last used: Last Thursday     Allergies   Penicillins   Review of Systems Review of Systems  Gastrointestinal: Positive for abdominal pain, nausea and vomiting.  All other systems reviewed and are negative.    Physical Exam Updated Vital Signs BP 135/71   Pulse 66   Temp 98.3 F (36.8 C) (Oral)   Resp 20   Ht 6' (1.829 m)   Wt 136.1 kg   LMP 10/26/2018 Comment: neg preg test 10/27/2018  SpO2 98%   BMI 40.69 kg/m   Physical Exam Vitals signs and nursing note reviewed.  Constitutional:      General: She is not in acute distress.    Appearance: She is well-developed.     Comments: Appears nontoxic  HENT:     Head: Normocephalic and atraumatic.  Eyes:     Conjunctiva/sclera: Conjunctivae normal.     Pupils: Pupils are equal, round, and reactive to light.  Neck:     Musculoskeletal: Normal range of motion and neck supple.  Cardiovascular:     Rate and Rhythm: Normal rate and regular rhythm.     Pulses: Normal pulses.  Pulmonary:     Effort: Pulmonary effort is normal. No respiratory distress.     Breath sounds: Normal breath sounds. No wheezing.  Abdominal:     General: There is no distension.     Palpations: Abdomen is soft. There is no mass.     Tenderness: There is abdominal tenderness. There is no guarding or rebound.     Comments: ttp of lower abd.  Musculoskeletal: Normal range of motion.  Skin:    General: Skin is warm and dry.     Capillary Refill: Capillary refill takes less than 2 seconds.   Neurological:     Mental Status: She is alert and oriented to person, place, and time.      ED Treatments / Results  Labs (all labs ordered are listed, but only abnormal results are displayed) Labs Reviewed  CBC WITH DIFFERENTIAL/PLATELET - Abnormal; Notable for the following components:  Result Value   WBC 13.8 (*)    Neutro Abs 11.6 (*)    All other components within normal limits  COMPREHENSIVE METABOLIC PANEL - Abnormal; Notable for the following components:   Potassium 2.9 (*)    Glucose, Bld 118 (*)    Calcium 8.7 (*)    Alkaline Phosphatase 34 (*)    Total Bilirubin 1.4 (*)    All other components within normal limits  MAGNESIUM  URINALYSIS, ROUTINE W REFLEX MICROSCOPIC    EKG EKG Interpretation  Date/Time:  Tuesday Oct 28 2018 13:18:07 EDT Ventricular Rate:  53 PR Interval:    QRS Duration: 94 QT Interval:  449 QTC Calculation: 422 R Axis:   51 Text Interpretation:  Sinus rhythm Probable left atrial enlargement Baseline wander in lead(s) II III aVR aVF Confirmed by Virgina Norfolk [phone]) on 10/28/2018 1:20:20 PM Also confirmed by Virgina Norfolk [id number]), editor Elita Quick ([id number])  on 10/28/2018 2:11:36 PM   Radiology Ct Abdomen Pelvis W Contrast  Result Date: 10/28/2018 CLINICAL DATA:  Abdominal pain and nausea and vomiting. EXAM: CT ABDOMEN AND PELVIS WITH CONTRAST TECHNIQUE: Multidetector CT imaging of the abdomen and pelvis was performed using the standard protocol following bolus administration of intravenous contrast. CONTRAST:  OMNIPAQUE IOHEXOL 300 MG/ML  SOLN COMPARISON:  CT scan dated 09/27/2016 and MRI of the pelvis dated 05/24/2017 FINDINGS: Lower chest: Negative. Hepatobiliary: The liver appears normal. No dilated bile ducts. There is a appearance of a small amount of edema in the anterior aspect of the wall of the gallbladder versus a small amount of pericholecystic fluid. However, this is unchanged since the prior study 09/27/2016.  Pancreas: Unremarkable. No pancreatic ductal dilatation or surrounding inflammatory changes. Spleen: Normal in size without focal abnormality. Adrenals/Urinary Tract: Adrenal glands are unremarkable. Kidneys are normal, without renal calculi, focal lesion, or hydronephrosis. Bladder is unremarkable. Stomach/Bowel: Stomach is within normal limits. Appendix appears normal. No evidence of bowel wall thickening, distention, or inflammatory changes. No evidence of diverticulitis. Vascular/Lymphatic: No significant vascular findings are present. No enlarged abdominal or pelvic lymph nodes. Reproductive: The uterus and left ovary appear normal. There is a 9 cm cyst lying in the pelvic cul-de-sac. This may originate from the right ovary. This appears slightly less prominent than on the prior pelvic MRI. Other: No abdominal wall hernia or abnormality. No abdominopelvic ascites. Abdominal wall hernia. Musculoskeletal: No acute or significant osseous findings. IMPRESSION: 1. No acute abnormalities of the abdomen or pelvis. 2. Chronic simple appearing cyst arising from the right ovary. 3. Slight chronic fluid in the gallbladder wall, nonspecific. This is unchanged since 2018. Electronically Signed   By: Francene Boyers M.D.   On: 10/28/2018 14:10    Procedures Procedures (including critical care time)  Medications Ordered in ED Medications  capsaicin (ZOSTRIX) 0.025 % cream ( Topical Given 10/28/18 1359)  sodium chloride (PF) 0.9 % injection (has no administration in time range)  potassium chloride 10 mEq in 100 mL IVPB (has no administration in time range)  droperidol (INAPSINE) 2.5 MG/ML injection 2.5 mg (has no administration in time range)  metoCLOPramide (REGLAN) injection 10 mg (10 mg Intravenous Given 10/28/18 1211)  sodium chloride 0.9 % bolus 500 mL (0 mLs Intravenous Stopped 10/28/18 1359)  promethazine (PHENERGAN) injection 25 mg (25 mg Intravenous Given 10/28/18 1302)  iohexol (OMNIPAQUE) 300 MG/ML solution  100 mL (100 mLs Intravenous Contrast Given 10/28/18 1346)     Initial Impression / Assessment and Plan / ED Course  I have  reviewed the triage vital signs and the nursing notes.  Pertinent labs & imaging results that were available during my care of the patient were reviewed by me and considered in my medical decision making (see chart for details).        Patient presenting for evaluation nausea, vomiting, abdominal pain.  Has been seen yesterday and the day before for the same.  On exam, patient with tenderness palpation of lower abdomen, but appears nontoxic.  She is afebrile.  Likely cyclical vomiting, and as her UDS was positive for marijuana, consider hyperemesis cannabinoid syndrome.  However, patient has not had any imaging, and his symptoms are persistent and her roommate had vomiting before symptoms began, will obtain CT to rule out infection.  Will obtain labs to check electrolytes.  Fluids, capsaicin cream, and Reglan given for symptom control.  Patient continues to vomit despite Reglan.  Phenergan given, EKG ordered to check QTC.  QTC less than 460.  On reassessment, patient reports symptoms are improving.  CT reassuring, no acute abnormalities.  Shows fluid around the gallbladder, which is been stable since 2018.  Doubt this is the cause of her symptoms. Labs delayed due to downtime.  Labs show hypokalemia at 2.9.  As symptoms are improving, will do p.o. challenge and p.o. potassium and plan for discharge.  Patient unable to tolerate p.o., before attempting started throwing up again.  As such, will switch to potassium IV, give droperidol for cyclical vomiting, and call for admission.  Discussed with Dr. Nelson Chimes [facility], patient to be admitted.   Final Clinical Impressions(s) / ED Diagnoses   Final diagnoses:  Cyclical vomiting  Lower abdominal pain    ED Discharge Orders         Ordered    ondansetron (ZOFRAN ODT) 4 MG disintegrating tablet  Every 8  hours PRN     10/28/18 1453           Brae Schaafsma, PA-C 10/28/18 1535    [place], Adam, DO 10/28/18 1537

## 2018-10-28 NOTE — Progress Notes (Addendum)
RN received a report from ED at 1636. Patient is transported to 5E at 86. Alert and oriented x 4. No pain complained. Vital signs was taken. Room is set up. Call light is within with patient's reach. Waiting for pharmacist to verify her medication ast MD ordered.

## 2018-10-28 NOTE — ED Notes (Signed)
Pt vomited so hard that she had a small accident on herself. Pt changing into blue paper scrubs at this time and then will obtain EKG

## 2018-10-28 NOTE — ED Notes (Signed)
Walked into pts room to give potassium PO and do fluid/PO challenge and pt was aggressively vomiting again. PA made aware and gave verbal order for 2 runs of potassium.

## 2018-10-28 NOTE — Discharge Instructions (Signed)
It is very important that you take the potassium daily as prescribed. Use Phenergan, either suppository or pill, as needed for nausea or vomiting.  You may also use the dissolving Zofran pill for nausea/vomiting.  Use the capsaicin cream to help with nausea, vomiting, abdominal pain. Stay on a liquid diet and gradually progress until symptoms improve.  Follow-up with your primary care doctor on Friday at your scheduled appointment. Return to the emergency room with any new, worsening, concerning symptoms.

## 2018-10-28 NOTE — ED Notes (Signed)
Pharmacy called to say that they had to send someone to [facility] to get Zostrix

## 2018-10-28 NOTE — ED Triage Notes (Signed)
Patient is from home and transported via [facility]. Patient has been seen in the emergency department for the last two days for diffuse abd cramping, nausea, and cyclical vomiting syndrome. On yesterdays visit, Dr. Jacqulyn Bath requested she be admitted, but wanted to be discharged 30 minutes after conversation about admission. Patient was discharged with a prescription of phenergan and potassium, which she has not filled. She states she can not drive and can not afford it. Patient is ambulatory.

## 2018-10-28 NOTE — ED Notes (Signed)
Transport called to transfer pt upstairs

## 2018-10-28 NOTE — ED Notes (Signed)
ED TO INPATIENT HANDOFF REPORT  ED Nurse Name and Phone #: Karma Ganja [phone]  S Name/Age/Gender Leslie Yates 28 y.o. female Room/Bed: WA01/WA01  Code Status   Code Status: Full Code  Home/SNF/Other Home Patient oriented to: self, place, time and situation Is this baseline? Yes   Triage Complete: Triage complete  Chief Complaint n/v  Triage Note Patient is from home and transported via [facility]. Patient has been seen in the emergency department for the last two days for diffuse abd cramping, nausea, and cyclical vomiting syndrome. On yesterdays visit, Dr. Jacqulyn Bath requested she be admitted, but wanted to be discharged 30 minutes after conversation about admission. Patient was discharged with a prescription of phenergan and potassium, which she has not filled. She states she can not drive and can not afford it. Patient is ambulatory.    Allergies Allergies  Allergen Reactions  . Penicillins Rash    Childhood rash Has patient had a PCN reaction causing immediate rash, facial/tongue/throat swelling, SOB or lightheadedness with hypotension: no Has patient had a PCN reaction causing severe rash involving mucus membranes or skin necrosis:no Has patient had a PCN reaction that required hospitalization no Has patient had a PCN reaction occurring within the last 10 years: no If all of the above answers are "NO", then may proceed with Cephalosporin use.     Level of Care/Admitting Diagnosis ED Disposition    ED Disposition Condition Comment   Admit  Hospital Area: [facility] [[id number]]  Level of Care: Med-Surg [16]  Covid Evaluation: Screening Protocol (No Symptoms)  Diagnosis: Cyclical vomiting, intractable [720309]  Admitting Physician: Stephania Fragmin [facility] [[id number]]  Attending Physician: Stephania Fragmin [facility] [[id number]]  PT Class (Do Not Modify): Observation [104]  PT Acc Code (Do Not Modify): Observation [10022]       B Medical/Surgery History Past  Medical History:  Diagnosis Date  . Asthma   . Asthma   . Obesity   . Ovarian cyst   . Sickle cell trait [facility])    Past Surgical History:  Procedure Laterality Date  . INCISION AND DRAINAGE ABSCESS       A IV Location/Drains/Wounds Patient Lines/Drains/Airways Status   Active Line/Drains/Airways    Name:   Placement date:   Placement time:   Site:   Days:   Peripheral IV 10/28/18 Right Antecubital   10/28/18    1209    Antecubital   less than 1          Intake/Output Last 24 hours No intake or output data in the 24 hours ending 10/28/18 1636  Labs/Imaging Results for orders placed or performed during the hospital encounter of 10/28/18 (from the past 48 hour(s))  CBC with Differential     Status: Abnormal   Collection Time: 10/28/18 12:15 PM  Result Value Ref Range   WBC 13.8 (H) 4.0 - 10.5 K/uL   RBC 4.03 3.87 - 5.11 MIL/uL   Hemoglobin 12.7 12.0 - 15.0 g/dL   HCT 57.8 46.9 - 62.9 %   MCV 89.6 80.0 - 100.0 fL   MCH 31.5 26.0 - 34.0 pg   MCHC 35.2 30.0 - 36.0 g/dL   RDW 52.8 41.3 - 24.4 %   Platelets 220 150 - 400 K/uL   nRBC 0.0 0.0 - 0.2 %   Neutrophils Relative % 85 %   Neutro Abs 11.6 (H) 1.7 - 7.7 K/uL   Lymphocytes Relative 9 %   Lymphs Abs 1.3 0.7 - 4.0 K/uL  Monocytes Relative 6 %   Monocytes Absolute 0.8 0.1 - 1.0 K/uL   Eosinophils Relative 0 %   Eosinophils Absolute 0.0 0.0 - 0.5 K/uL   Basophils Relative 0 %   Basophils Absolute 0.0 0.0 - 0.1 K/uL   Immature Granulocytes 0 %   Abs Immature Granulocytes 0.06 0.00 - 0.07 K/uL    Comment: Performed at [facility], [address], [place], [place] [address]  Comprehensive metabolic panel     Status: Abnormal   Collection Time: 10/28/18 12:15 PM  Result Value Ref Range   Sodium 140 135 - 145 mmol/L   Potassium 2.9 (L) 3.5 - 5.1 mmol/L   Chloride 108 98 - 111 mmol/L   CO2 24 22 - 32 mmol/L   Glucose, Bld 118 (H) 70 - 99 mg/dL   BUN 9 6 - 20 mg/dL   Creatinine, Ser 9.81 0.44 -  1.00 mg/dL   Calcium 8.7 (L) 8.9 - 10.3 mg/dL   Total Protein 7.3 6.5 - 8.1 g/dL   Albumin 4.2 3.5 - 5.0 g/dL   AST 21 15 - 41 U/L   ALT 17 0 - 44 U/L   Alkaline Phosphatase 34 (L) 38 - 126 U/L   Total Bilirubin 1.4 (H) 0.3 - 1.2 mg/dL   GFR calc non Af Amer >60 >60 mL/min   GFR calc Af Amer >60 >60 mL/min   Anion gap 8 5 - 15    Comment: Performed at [facility], [address] [place], [place] [address]  Magnesium     Status: None   Collection Time: 10/28/18 12:15 PM  Result Value Ref Range   Magnesium 2.0 1.7 - 2.4 mg/dL    Comment: Performed at [facility], [address], [place], [place] [address]   Ct Abdomen Pelvis W Contrast  Result Date: 10/28/2018 CLINICAL DATA:  Abdominal pain and nausea and vomiting. EXAM: CT ABDOMEN AND PELVIS WITH CONTRAST TECHNIQUE: Multidetector CT imaging of the abdomen and pelvis was performed using the standard protocol following bolus administration of intravenous contrast. CONTRAST:  OMNIPAQUE IOHEXOL 300 MG/ML  SOLN COMPARISON:  CT scan dated 09/27/2016 and MRI of the pelvis dated 05/24/2017 FINDINGS: Lower chest: Negative. Hepatobiliary: The liver appears normal. No dilated bile ducts. There is a appearance of a small amount of edema in the anterior aspect of the wall of the gallbladder versus a small amount of pericholecystic fluid. However, this is unchanged since the prior study 09/27/2016. Pancreas: Unremarkable. No pancreatic ductal dilatation or surrounding inflammatory changes. Spleen: Normal in size without focal abnormality. Adrenals/Urinary Tract: Adrenal glands are unremarkable. Kidneys are normal, without renal calculi, focal lesion, or hydronephrosis. Bladder is unremarkable. Stomach/Bowel: Stomach is within normal limits. Appendix appears normal. No evidence of bowel wall thickening, distention, or inflammatory changes. No evidence of diverticulitis. Vascular/Lymphatic: No significant vascular  findings are present. No enlarged abdominal or pelvic lymph nodes. Reproductive: The uterus and left ovary appear normal. There is a 9 cm cyst lying in the pelvic cul-de-sac. This may originate from the right ovary. This appears slightly less prominent than on the prior pelvic MRI. Other: No abdominal wall hernia or abnormality. No abdominopelvic ascites. Abdominal wall hernia. Musculoskeletal: No acute or significant osseous findings. IMPRESSION: 1. No acute abnormalities of the abdomen or pelvis. 2. Chronic simple appearing cyst arising from the right ovary. 3. Slight chronic fluid in the gallbladder wall, nonspecific. This is unchanged since 2018. Electronically Signed   By: Fayrene Fearing  Maxwell M.D.   On: 10/28/2018 14:10    Pending Labs Unresulted Labs (From admission, onward)    Start     Ordered   10/29/18 0500  Comprehensive metabolic panel  Tomorrow morning,   R    Question:  Specimen collection method  Answer:  Lab=Lab collect   10/28/18 1622   10/29/18 0500  CBC  Tomorrow morning,   R    Question:  Specimen collection method  Answer:  Lab=Lab collect   10/28/18 1622   10/29/18 0500  Protime-INR  Tomorrow morning,   R    Question:  Specimen collection method  Answer:  Lab=Lab collect   10/28/18 1622   10/29/18 0500  APTT  Tomorrow morning,   R    Question:  Specimen collection method  Answer:  Lab=Lab collect   10/28/18 1622   10/29/18 0500  Magnesium  Daily,   R    Question:  Specimen collection method  Answer:  Lab=Lab collect   10/28/18 1622   10/29/18 0500  Basic metabolic panel  Daily,   R    Question:  Specimen collection method  Answer:  Lab=Lab collect   10/28/18 1622   10/28/18 1620  HIV antibody (Routine Testing)  Once,   R    Question:  Specimen collection method  Answer:  Lab=Lab collect   10/28/18 1622   10/28/18 1156  Urinalysis, Routine w reflex microscopic  Once,   R     10/28/18 1156          Vitals/Pain Today's Vitals   10/28/18 1117 10/28/18 1316 10/28/18  1330 10/28/18 1530  BP: (!) 172/100 135/71 128/60 (!) 172/80  Pulse: 72 66 (!) 51 (!) 52  Resp: 16 20 (!) 21 18  Temp: 98.3 F (36.8 C)     TempSrc: Oral     SpO2: 100% 98% 98% 100%  Weight: 136.1 kg     Height: 6' (1.829 m)     PainSc: 0-No pain       Isolation Precautions No active isolations  Medications Medications  capsaicin (ZOSTRIX) 0.025 % cream ( Topical Given 10/28/18 1359)  sodium chloride (PF) 0.9 % injection (has no administration in time range)  potassium chloride 10 mEq in 100 mL IVPB (10 mEq Intravenous New Bag/Given 10/28/18 1536)  0.9 %  sodium chloride infusion (has no administration in time range)  acetaminophen (TYLENOL) tablet 650 mg (has no administration in time range)    Or  acetaminophen (TYLENOL) suppository 650 mg (has no administration in time range)  senna-docusate (Senokot-S) tablet 1 tablet (has no administration in time range)  bisacodyl (DULCOLAX) EC tablet 5 mg (has no administration in time range)  ondansetron (ZOFRAN) tablet 4 mg (has no administration in time range)    Or  ondansetron (ZOFRAN) injection 4 mg (has no administration in time range)  hydrALAZINE (APRESOLINE) injection 10 mg (has no administration in time range)  polyethylene glycol (MIRALAX / GLYCOLAX) packet 17 g (has no administration in time range)  senna-docusate (Senokot-S) tablet 2 tablet (has no administration in time range)  ipratropium-albuterol (DUONEB) 0.5-2.5 (3) MG/3ML nebulizer solution 3 mL (has no administration in time range)  potassium chloride 10 mEq in 100 mL IVPB (has no administration in time range)  magnesium sulfate IVPB 2 g 50 mL (has no administration in time range)  pantoprazole (PROTONIX) EC tablet 40 mg (has no administration in time range)  metoCLOPramide (REGLAN) injection 10 mg (has no administration in time range)  metoCLOPramide (REGLAN)  injection 10 mg (10 mg Intravenous Given 10/28/18 1211)  sodium chloride 0.9 % bolus 500 mL (0 mLs Intravenous  Stopped 10/28/18 1359)  promethazine (PHENERGAN) injection 25 mg (25 mg Intravenous Given 10/28/18 1302)  iohexol (OMNIPAQUE) 300 MG/ML solution 100 mL (100 mLs Intravenous Contrast Given 10/28/18 1346)  droperidol (INAPSINE) 2.5 MG/ML injection 2.5 mg (2.5 mg Intravenous Given 10/28/18 1531)    Mobility walks Low fall risk   Focused Assessments Cardiac Assessment Handoff:    No results found for: CKTOTAL, CKMB, CKMBINDEX, TROPONINI No results found for: DDIMER Does the Patient currently have chest pain? No      R Recommendations: See Admitting Provider Note  Report given to:   Additional Notes:

## 2018-10-28 NOTE — ED Notes (Signed)
Bed: WA01 Expected date:  Expected time:  Means of arrival:  Comments: EMS 28 yo N/V seen yesterday for same

## 2018-10-28 NOTE — H&P (Signed)
History and Physical    Leslie Yates ZOX:[id number] DOB: 03-Nov-1990 DOA: 10/28/2018  PCP: Claiborne Rigg, NP Patient coming from: Home  Chief Complaint: Nausea vomiting  HPI: Leslie Yates is a 28 y.o. female with medical history significant of past medical history of asthma and tobacco use came to the hospital for evaluation of nausea and vomiting.  Patient has been having nausea and nonbloody vomiting for the past 3-4 days which has been persisting.  Unable to keep any oral intake down during this time.  She visited ER couple of times in the last 2 days and was sent home but because her symptoms persisted she returned again.  Denies any fevers, chills or other complaints.  She admits of using marijuana at a party last week.  Otherwise denies regular use of any illicit substances.  Smokes 1 pack of cigarettes daily.  Denies any alcohol use In the ER patient's labs showed hypokalemia with potassium of 2.9 and mild leukocytosis of 13.  CT of the abdomen pelvis was negative.  She was unable to take oral intake therefore admitted to the hospital for further care and management.   Review of Systems: As per HPI otherwise 10 point review of systems negative.  Review of Systems Otherwise negative except as per HPI, including: General: Denies fever, chills, night sweats or unintended weight loss. Resp: Denies cough, wheezing, shortness of breath. Cardiac: Denies chest pain, palpitations, orthopnea, paroxysmal nocturnal dyspnea. GI: Denies abdominal pain,  diarrhea or constipation GU: Denies dysuria, frequency, hesitancy or incontinence MS: Denies muscle aches, joint pain or swelling Neuro: Denies headache, neurologic deficits (focal weakness, numbness, tingling), abnormal gait Psych: Denies anxiety, depression, SI/HI/AVH Skin: Denies new rashes or lesions ID: Denies sick contacts, exotic exposures, travel  Past Medical History:  Diagnosis Date  . Asthma   . Asthma   . Obesity   . Ovarian  cyst   . Sickle cell trait [facility])     Past Surgical History:  Procedure Laterality Date  . INCISION AND DRAINAGE ABSCESS      SOCIAL HISTORY:  reports that she has been smoking cigarettes. She has a 11.00 pack-year smoking history. She has never used smokeless tobacco. She reports current alcohol use. She reports current drug use. Drug: Benzodiazepines.  Allergies  Allergen Reactions  . Penicillins Rash    Childhood rash Has patient had a PCN reaction causing immediate rash, facial/tongue/throat swelling, SOB or lightheadedness with hypotension: no Has patient had a PCN reaction causing severe rash involving mucus membranes or skin necrosis:no Has patient had a PCN reaction that required hospitalization no Has patient had a PCN reaction occurring within the last 10 years: no If all of the above answers are "NO", then may proceed with Cephalosporin use.     FAMILY HISTORY: Family History  Problem Relation Age of Onset  . Diabetes Mother   . Hypertension Mother   . Diabetes Father   . Hypertension Father      Prior to Admission medications   Medication Sig Start Date End Date Taking? Authorizing Provider  acetaminophen (TYLENOL) 325 MG tablet Take 650 mg by mouth every 6 (six) hours as needed for mild pain or headache.   Yes [provider]  promethazine (PHENERGAN) 25 MG tablet Take 1 tablet (25 mg total) by mouth every 6 (six) hours as needed for nausea or vomiting. 10/26/18  Yes Shaune Pollack, MD  albuterol (PROVENTIL HFA;VENTOLIN HFA) 108 (90 Base) MCG/ACT inhaler Inhale 2 puffs into the lungs every  6 (six) hours as needed for wheezing or shortness of breath. Patient not taking: Reported on 10/27/2018 11/26/16   Lizbeth BarkHairston, Mandesia R, FNP  chlorhexidine (HIBICLENS) 4 % external liquid Apply topically daily as needed. Patient not taking: Reported on 10/27/2018 08/19/18   Claiborne RiggFleming, Zelda W, NP  ibuprofen (ADVIL,MOTRIN) 600 MG tablet Take 1 tablet (600 mg total) by mouth every  6 (six) hours as needed for moderate pain (Take with food.). Patient not taking: Reported on 10/27/2018 10/25/17   Fayrene Helperran, Bowie, PA-C  methocarbamol (ROBAXIN) 500 MG tablet Take 2 tablets (1,000 mg total) by mouth 3 (three) times daily. Patient not taking: Reported on 08/19/2018 05/09/18   Pricilla LovelessGoldston, Scott, MD  ondansetron (ZOFRAN ODT) 4 MG disintegrating tablet Take 1 tablet (4 mg total) by mouth every 8 (eight) hours as needed for nausea or vomiting. 10/28/18   Caccavale, Sophia, PA-C  potassium chloride (K-DUR) 10 MEQ tablet Take 4 tablets (40 mEq total) by mouth daily for 5 days. 10/27/18 11/01/18  Long, Arlyss RepressJoshua G, MD  promethazine (PHENERGAN) 25 MG suppository Place 1 suppository (25 mg total) rectally every 6 (six) hours as needed for nausea or vomiting. 10/27/18   Maia PlanLong, Joshua G, MD    Physical Exam: Vitals:   10/28/18 1117 10/28/18 1316 10/28/18 1330 10/28/18 1530  BP: (!) 172/100 135/71 128/60 (!) 172/80  Pulse: 72 66 (!) 51 (!) 52  Resp: 16 20 (!) 21 18  Temp: 98.3 F (36.8 C)     TempSrc: Oral     SpO2: 100% 98% 98% 100%  Weight: 136.1 kg     Height: 6' (1.829 m)         Constitutional: Slight distress due to feeling of nausea Eyes: PERRL, lids and conjunctivae normal ENMT: Mucous membranes are moist. Posterior pharynx clear of any exudate or lesions.Normal dentition.  Neck: normal, supple, no masses, no thyromegaly Respiratory: clear to auscultation bilaterally, no wheezing, no crackles. Normal respiratory effort. No accessory muscle use.  Cardiovascular: Regular rate and rhythm, no murmurs / rubs / gallops. No extremity edema. 2+ pedal pulses. No carotid bruits.  Abdomen: no tenderness, no masses palpated. No hepatosplenomegaly. Bowel sounds positive.  Musculoskeletal: no clubbing / cyanosis. No joint deformity upper and lower extremities. Good ROM, no contractures. Normal muscle tone.  Skin: no rashes, lesions, ulcers. No induration Neurologic: CN 2-12 grossly intact. Sensation  intact, DTR normal. Strength 5/5 in all 4.  Psychiatric: Normal judgment and insight. Alert and oriented x 3. Normal mood.     Labs on Admission: I have personally reviewed following labs and imaging studies  CBC: Recent Labs  Lab 10/26/18 1025 10/27/18 1617 10/28/18 1215  WBC 14.5* 10.8* 13.8*  NEUTROABS  --  9.4* 11.6*  HGB 14.2 13.5 12.7  HCT 40.6 39.8 36.1  MCV 89.0 91.3 89.6  PLT 231 199 220   Basic Metabolic Panel: Recent Labs  Lab 10/26/18 1025 10/27/18 1617 10/28/18 1215  NA 140 139 140  K 3.2* 3.1* 2.9*  CL 109 110 108  CO2 17* 23 24  GLUCOSE 147* 116* 118*  BUN 14 12 9   CREATININE 0.89 0.85 0.76  CALCIUM 9.1 8.5* 8.7*  MG  --   --  2.0   GFR: Estimated Creatinine Clearance: 163.9 mL/min (by C-G formula based on SCr of 0.76 mg/dL). Liver Function Tests: Recent Labs  Lab 10/26/18 1025 10/27/18 1617 10/28/18 1215  AST 23 20 21   ALT 15 15 17   ALKPHOS 37* 36* 34*  BILITOT 1.0 1.3*  1.4*  PROT 7.9 7.6 7.3  ALBUMIN 4.4 4.4 4.2   Recent Labs  Lab 10/26/18 1025 10/27/18 1617  LIPASE 26 24   No results for input(s): AMMONIA in the last 168 hours. Coagulation Profile: No results for input(s): INR, PROTIME in the last 168 hours. Cardiac Enzymes: No results for input(s): CKTOTAL, CKMB, CKMBINDEX, TROPONINI in the last 168 hours. BNP (last 3 results) No results for input(s): PROBNP in the last 8760 hours. HbA1C: No results for input(s): HGBA1C in the last 72 hours. CBG: No results for input(s): GLUCAP in the last 168 hours. Lipid Profile: No results for input(s): CHOL, HDL, LDLCALC, TRIG, CHOLHDL, LDLDIRECT in the last 72 hours. Thyroid Function Tests: No results for input(s): TSH, T4TOTAL, FREET4, T3FREE, THYROIDAB in the last 72 hours. Anemia Panel: No results for input(s): VITAMINB12, FOLATE, FERRITIN, TIBC, IRON, RETICCTPCT in the last 72 hours. Urine analysis:    Component Value Date/Time   COLORURINE YELLOW 10/26/2018 0939   APPEARANCEUR  CLEAR 10/26/2018 0939   LABSPEC 1.018 10/26/2018 0939   PHURINE 6.0 10/26/2018 0939   GLUCOSEU NEGATIVE 10/26/2018 0939   HGBUR SMALL (A) 10/26/2018 0939   BILIRUBINUR NEGATIVE 10/26/2018 0939   BILIRUBINUR negative 08/19/2018 1544   BILIRUBINUR small 11/15/2016 0943   KETONESUR 20 (A) 10/26/2018 0939   PROTEINUR 100 (A) 10/26/2018 0939   UROBILINOGEN 1.0 08/19/2018 1544   UROBILINOGEN 0.2 02/28/2016 1924   NITRITE NEGATIVE 10/26/2018 0939   LEUKOCYTESUR NEGATIVE 10/26/2018 0939   Sepsis Labs: !!!!!!!!!!!!!!!!!!!!!!!!!!!!!!!!!!!!!!!!!!!! @LABRCNTIP (procalcitonin:4,lacticidven:4) )No results found for this or any previous visit (from the past 240 hour(s)).   Radiological Exams on Admission: Ct Abdomen Pelvis W Contrast  Result Date: 10/28/2018 CLINICAL DATA:  Abdominal pain and nausea and vomiting. EXAM: CT ABDOMEN AND PELVIS WITH CONTRAST TECHNIQUE: Multidetector CT imaging of the abdomen and pelvis was performed using the standard protocol following bolus administration of intravenous contrast. CONTRAST:  OMNIPAQUE IOHEXOL 300 MG/ML  SOLN COMPARISON:  CT scan dated 09/27/2016 and MRI of the pelvis dated 05/24/2017 FINDINGS: Lower chest: Negative. Hepatobiliary: The liver appears normal. No dilated bile ducts. There is a appearance of a small amount of edema in the anterior aspect of the wall of the gallbladder versus a small amount of pericholecystic fluid. However, this is unchanged since the prior study 09/27/2016. Pancreas: Unremarkable. No pancreatic ductal dilatation or surrounding inflammatory changes. Spleen: Normal in size without focal abnormality. Adrenals/Urinary Tract: Adrenal glands are unremarkable. Kidneys are normal, without renal calculi, focal lesion, or hydronephrosis. Bladder is unremarkable. Stomach/Bowel: Stomach is within normal limits. Appendix appears normal. No evidence of bowel wall thickening, distention, or inflammatory changes. No evidence of diverticulitis.  Vascular/Lymphatic: No significant vascular findings are present. No enlarged abdominal or pelvic lymph nodes. Reproductive: The uterus and left ovary appear normal. There is a 9 cm cyst lying in the pelvic cul-de-sac. This may originate from the right ovary. This appears slightly less prominent than on the prior pelvic MRI. Other: No abdominal wall hernia or abnormality. No abdominopelvic ascites. Abdominal wall hernia. Musculoskeletal: No acute or significant osseous findings. IMPRESSION: 1. No acute abnormalities of the abdomen or pelvis. 2. Chronic simple appearing cyst arising from the right ovary. 3. Slight chronic fluid in the gallbladder wall, nonspecific. This is unchanged since 2018. Electronically Signed   By: Francene Boyers M.D.   On: 10/28/2018 14:10     All images have been reviewed by me personally.  EKG: Independently reviewed.  QTc 422  Assessment/Plan Principal Problem:  Cyclical vomiting, intractable Active Problems:   Asthma   Hypokalemia   Tobacco use   Intractable nausea vomiting, suspect cyclical vomiting with use of THC Hypokalemia -Admit the patient for observation.  CT of the abdomen pelvis is negative for any acute pathology.  IV fluids, antiemetics especially time Reglan before her meals.  Counseled to quit using marijuana.  Supportive care - Aggressive potassium repletion  Repeat labs tomorrow.  Leukocytosis, likely reactive.  No obvious signs of infection.  Hold off on antibiotics  Daily tobacco use History of asthma -Stable.  Counseled to quit using tobacco.  Bronchodilators PRN.  Nicotine patch as necessary.  DVT prophylaxis: Early ambulation Code Status: Full code Family Communication: None at bedside Disposition Plan: Discharge once her nausea vomiting is resolved Consults called: None Admission status: Observation   Time Spent: 65 minutes.  >50% of the time was devoted to discussing the patients care, assessment, plan and disposition with other  care givers along with counseling the patient about the risks and benefits of treatment.     Joline Maxcy MD [facility]  If 7PM-7AM, please contact night-coverage [email or web address]  10/28/2018, 4:23 PM

## 2018-10-29 DIAGNOSIS — F12988 Cannabis use, unspecified with other cannabis-induced disorder: Secondary | ICD-10-CM

## 2018-10-29 DIAGNOSIS — E876 Hypokalemia: Secondary | ICD-10-CM

## 2018-10-29 DIAGNOSIS — J45909 Unspecified asthma, uncomplicated: Secondary | ICD-10-CM

## 2018-10-29 DIAGNOSIS — Z720 Tobacco use: Secondary | ICD-10-CM

## 2018-10-29 LAB — CBC
HCT: 36.5 % (ref 36.0–46.0)
Hemoglobin: 12.4 g/dL (ref 12.0–15.0)
MCH: 30.9 pg (ref 26.0–34.0)
MCHC: 34 g/dL (ref 30.0–36.0)
MCV: 91 fL (ref 80.0–100.0)
Platelets: 206 10*3/uL (ref 150–400)
RBC: 4.01 MIL/uL (ref 3.87–5.11)
RDW: 12.3 % (ref 11.5–15.5)
WBC: 12.6 10*3/uL — ABNORMAL HIGH (ref 4.0–10.5)
nRBC: 0 % (ref 0.0–0.2)

## 2018-10-29 LAB — COMPREHENSIVE METABOLIC PANEL
ALT: 22 U/L (ref 0–44)
AST: 24 U/L (ref 15–41)
Albumin: 3.8 g/dL (ref 3.5–5.0)
Alkaline Phosphatase: 32 U/L — ABNORMAL LOW (ref 38–126)
Anion gap: 9 (ref 5–15)
BUN: 7 mg/dL (ref 6–20)
CO2: 23 mmol/L (ref 22–32)
Calcium: 8.2 mg/dL — ABNORMAL LOW (ref 8.9–10.3)
Chloride: 109 mmol/L (ref 98–111)
Creatinine, Ser: 0.81 mg/dL (ref 0.44–1.00)
GFR calc Af Amer: >60 mL/min (ref >60)
GFR calc non Af Amer: >60 mL/min (ref >60)
Glucose, Bld: 114 mg/dL — ABNORMAL HIGH (ref 70–99)
Potassium: 3.3 mmol/L — ABNORMAL LOW (ref 3.5–5.1)
Sodium: 141 mmol/L (ref 135–145)
Total Bilirubin: 1.2 mg/dL (ref 0.3–1.2)
Total Protein: 6.5 g/dL (ref 6.5–8.1)

## 2018-10-29 LAB — HIV ANTIBODY (ROUTINE TESTING W REFLEX): HIV Screen 4th Generation wRfx: NONREACTIVE

## 2018-10-29 LAB — PROTIME-INR
INR: 1.1 (ref 0.8–1.2)
Prothrombin Time: 14.5 seconds (ref 11.4–15.2)

## 2018-10-29 LAB — HEMOGLOBIN A1C
Hgb A1c MFr Bld: 5.1 % (ref 4.8–5.6)
Mean Plasma Glucose: 99.67 mg/dL

## 2018-10-29 LAB — APTT: aPTT: 32 seconds (ref 24–36)

## 2018-10-29 LAB — MAGNESIUM: Magnesium: 2.3 mg/dL (ref 1.7–2.4)

## 2018-10-29 LAB — GLUCOSE, CAPILLARY: Glucose-Capillary: 143 mg/dL — ABNORMAL HIGH (ref 70–99)

## 2018-10-29 MED ORDER — POTASSIUM CHLORIDE 10 MEQ/100ML IV SOLN
10.0000 meq | INTRAVENOUS | Status: AC
  Administered 2018-10-29 (×4): 10 meq via INTRAVENOUS
  Filled 2018-10-29 (×4): qty 100

## 2018-10-29 MED ORDER — POLYETHYLENE GLYCOL 3350 17 G PO PACK: 17.0000 g | PACK | Freq: Every day | ORAL | 0 refills | Status: DC | PRN

## 2018-10-29 MED ORDER — PANTOPRAZOLE SODIUM 40 MG PO TBEC: 40.0000 mg | DELAYED_RELEASE_TABLET | Freq: Every day | ORAL | 0 refills | Status: DC

## 2018-10-29 MED ORDER — POTASSIUM CHLORIDE CRYS ER 20 MEQ PO TBCR
40.0000 meq | EXTENDED_RELEASE_TABLET | Freq: Once | ORAL | Status: AC
  Administered 2018-10-29: 40 meq via ORAL
  Filled 2018-10-29: qty 2

## 2018-10-29 MED ORDER — PROMETHAZINE HCL 25 MG/ML IJ SOLN
12.5000 mg | Freq: Once | INTRAMUSCULAR | Status: AC
  Administered 2018-10-29: 12.5 mg via INTRAVENOUS
  Filled 2018-10-29: qty 1

## 2018-10-29 MED ORDER — SENNOSIDES-DOCUSATE SODIUM 8.6-50 MG PO TABS: 1.0000 | ORAL_TABLET | Freq: Every evening | ORAL | 0 refills | Status: DC | PRN

## 2018-10-29 MED FILL — PANTOPRAZOLE SOD DR 40 MG T: 40 | 30 days supply | Qty: 30 | Fill #0

## 2018-10-29 NOTE — Discharge Summary (Signed)
Physician Discharge Summary  DIONNI SURA ZOX:[id number] DOB: Aug 02, 1990 DOA: 10/28/2018  PCP: Claiborne Rigg, NP  Admit date: 10/28/2018 Discharge date: 10/29/2018  Admitted From: Home Disposition: Home  Recommendations for Outpatient Follow-up:  1. Follow up with PCP in 1-2 weeks 2. Avoid Cannabis  3. Please obtain CMP/CBCl, Mag, Phos in one week 4. Please follow up on the following pending results:  Home Health: No Equipment/Devices: None  Discharge Condition: Stable CODE STATUS: FULL CODe  Diet recommendation: Soft Diet   Brief/Interim Summary: HPI per Dr. Stephania Fragmin on 10/28/2018 Leslie Yates is a 28 y.o. female with medical history significant of past medical history of asthma and tobacco use came to the hospital for evaluation of nausea and vomiting.  Patient has been having nausea and nonbloody vomiting for the past 3-4 days which has been persisting.  Unable to keep any oral intake down during this time.  She visited ER couple of times in the last 2 days and was sent home but because her symptoms persisted she returned again.  Denies any fevers, chills or other complaints.  She admits of using marijuana at a party last week.  Otherwise denies regular use of any illicit substances.  Smokes 1 pack of cigarettes daily.  Denies any alcohol use In the ER patient's labs showed hypokalemia with potassium of 2.9 and mild leukocytosis of 13.  CT of the abdomen pelvis was negative.  She was unable to take oral intake therefore admitted to the hospital for further care and management.  **Interim History Her nausea and vomiting improved and diet was advanced and she is able to tolerate soft diet without any issues.  She is given IV fluid hydration and was stable for discharge.  Potassium was low prior to discharge this was a repeat.  She was advised the risks of taking cannabis.  She will need to follow-up with PCP within 1 week at discharge.  Discharge Diagnoses:  Principal Problem:    Cyclical vomiting, intractable Active Problems:   Asthma   Hypokalemia   Tobacco use   Intractable nausea vomiting, suspect cyclical vomiting with use of THC -Admitted the patient for observation.  -CT of the abdomen pelvis is negative for any acute pathology.   -Given IV fluids, antiemetics especially timed Reglan before her meals.   -Counseled to quit using marijuana.   -Supportive care -Continue with home antiemetics and suppositories -Follow-up with PCP within 1 week  Hypokalemia -Aggressive potassium repletion discharged with p.o. and IV -Continue monitor and trend and repeat CMP in the outpatient setting.  Leukocytosis, likely reactive.   -No obvious signs of infection.   -CBC is trending down -Hold off on antibiotics  Daily tobacco use -Smoking cessation counseling given -Nicotine patch as necessary  History of asthma -Stable.  Counseled to quit using tobacco.  Bronchodilators PRN.  Nicotine patch as necessary.  Hyperbilirubinemia -Improved. T Bili went from 1.4 -> 1.2 -Continue to Monitor and Trend as an outpatient -IF continues to be elevated recommend outpatient RUQ U/S  Morbid Obesity -Estimated body mass index is 40.84 kg/m as calculated from the following:   Height as of this encounter: 6' (1.829 m).   Weight as of this encounter: 136.6 kg. -Weight Loss and Dietary Counseling given   Discharge Instructions Discharge Instructions    Call MD for:  difficulty breathing, headache or visual disturbances   Complete by:  As directed    Call MD for:  extreme fatigue   Complete by:  As  directed    Call MD for:  hives   Complete by:  As directed    Call MD for:  persistant dizziness or light-headedness   Complete by:  As directed    Call MD for:  persistant nausea and vomiting   Complete by:  As directed    Call MD for:  redness, tenderness, or signs of infection (pain, swelling, redness, odor or green/yellow discharge around incision site)   Complete by:   As directed    Call MD for:  severe uncontrolled pain   Complete by:  As directed    Call MD for:  temperature >100.4   Complete by:  As directed    Diet - low sodium heart healthy   Complete by:  As directed    Discharge instructions   Complete by:  As directed    You were cared for by a hospitalist during your hospital stay. If you have any questions about your discharge medications or the care you received while you were in the hospital after you are discharged, you can call the unit and ask to speak with the hospitalist on call if the hospitalist that took care of you is not available. Once you are discharged, your primary care physician will handle any further medical issues. Please note that NO REFILLS for any discharge medications will be authorized once you are discharged, as it is imperative that you return to your primary care physician (or establish a relationship with a primary care physician if you do not have one) for your aftercare needs so that they can reassess your need for medications and monitor your lab values.  Follow up with PCP and avoid Cannibas. Take all medications as prescribed. If symptoms change or worsen please return to the ED for evaluation   Increase activity slowly   Complete by:  As directed      Allergies as of 10/29/2018      Reactions   Penicillins Rash   Childhood rash Has patient had a PCN reaction causing immediate rash, facial/tongue/throat swelling, SOB or lightheadedness with hypotension: no Has patient had a PCN reaction causing severe rash involving mucus membranes or skin necrosis:no Has patient had a PCN reaction that required hospitalization no Has patient had a PCN reaction occurring within the last 10 years: no If all of the above answers are "NO", then may proceed with Cephalosporin use.      Medication List    TAKE these medications   acetaminophen 325 MG tablet Commonly known as:  TYLENOL Take 650 mg by mouth every 6 (six) hours as  needed for mild pain or headache.   albuterol 108 (90 Base) MCG/ACT inhaler Commonly known as:  VENTOLIN HFA Inhale 2 puffs into the lungs every 6 (six) hours as needed for wheezing or shortness of breath.   chlorhexidine 4 % external liquid Commonly known as:  Hibiclens Apply topically daily as needed.   ibuprofen 600 MG tablet Commonly known as:  ADVIL Take 1 tablet (600 mg total) by mouth every 6 (six) hours as needed for moderate pain (Take with food.).   methocarbamol 500 MG tablet Commonly known as:  ROBAXIN Take 2 tablets (1,000 mg total) by mouth 3 (three) times daily.   ondansetron 4 MG disintegrating tablet Commonly known as:  Zofran ODT Take 1 tablet (4 mg total) by mouth every 8 (eight) hours as needed for nausea or vomiting.   pantoprazole 40 MG tablet Commonly known as:  PROTONIX Take 1 tablet (  40 mg total) by mouth daily. Start taking on:  Oct 30, 2018   polyethylene glycol 17 g packet Commonly known as:  MIRALAX / GLYCOLAX Take 17 g by mouth daily as needed for moderate constipation or severe constipation.   potassium chloride 10 MEQ tablet Commonly known as:  K-DUR Take 4 tablets (40 mEq total) by mouth daily for 5 days.   promethazine 25 MG tablet Commonly known as:  PHENERGAN Take 1 tablet (25 mg total) by mouth every 6 (six) hours as needed for nausea or vomiting.   promethazine 25 MG suppository Commonly known as:  Phenergan Place 1 suppository (25 mg total) rectally every 6 (six) hours as needed for nausea or vomiting.   senna-docusate 8.6-50 MG tablet Commonly known as:  Senokot-S Take 1 tablet by mouth at bedtime as needed for mild constipation.      Follow-up Information    Claiborne Rigg, NP. Go on 10/31/2018.   Specialty:  Nurse Practitioner Why:  for your scheduled appointment Contact information: [address] [place] [address] [phone]          Allergies  Allergen Reactions  . Penicillins Rash    Childhood  rash Has patient had a PCN reaction causing immediate rash, facial/tongue/throat swelling, SOB or lightheadedness with hypotension: no Has patient had a PCN reaction causing severe rash involving mucus membranes or skin necrosis:no Has patient had a PCN reaction that required hospitalization no Has patient had a PCN reaction occurring within the last 10 years: no If all of the above answers are "NO", then may proceed with Cephalosporin use.    Consultations:  None  Procedures/Studies: Dg Chest 2 View  Result Date: 10/26/2018 CLINICAL DATA:  Dyspnea EXAM: CHEST - 2 VIEW COMPARISON:  05/09/2018 chest radiograph. FINDINGS: Stable cardiomediastinal silhouette with normal heart size. No pneumothorax. No pleural effusion. Lungs appear clear, with no acute consolidative airspace disease and no pulmonary edema. IMPRESSION: No active cardiopulmonary disease. Electronically Signed   By: Delbert Phenix M.D.   On: 10/26/2018 10:02   Ct Abdomen Pelvis W Contrast  Result Date: 10/28/2018 CLINICAL DATA:  Abdominal pain and nausea and vomiting. EXAM: CT ABDOMEN AND PELVIS WITH CONTRAST TECHNIQUE: Multidetector CT imaging of the abdomen and pelvis was performed using the standard protocol following bolus administration of intravenous contrast. CONTRAST:  OMNIPAQUE IOHEXOL 300 MG/ML  SOLN COMPARISON:  CT scan dated 09/27/2016 and MRI of the pelvis dated 05/24/2017 FINDINGS: Lower chest: Negative. Hepatobiliary: The liver appears normal. No dilated bile ducts. There is a appearance of a small amount of edema in the anterior aspect of the wall of the gallbladder versus a small amount of pericholecystic fluid. However, this is unchanged since the prior study 09/27/2016. Pancreas: Unremarkable. No pancreatic ductal dilatation or surrounding inflammatory changes. Spleen: Normal in size without focal abnormality. Adrenals/Urinary Tract: Adrenal glands are unremarkable. Kidneys are normal, without renal calculi, focal  lesion, or hydronephrosis. Bladder is unremarkable. Stomach/Bowel: Stomach is within normal limits. Appendix appears normal. No evidence of bowel wall thickening, distention, or inflammatory changes. No evidence of diverticulitis. Vascular/Lymphatic: No significant vascular findings are present. No enlarged abdominal or pelvic lymph nodes. Reproductive: The uterus and left ovary appear normal. There is a 9 cm cyst lying in the pelvic cul-de-sac. This may originate from the right ovary. This appears slightly less prominent than on the prior pelvic MRI. Other: No abdominal wall hernia or abnormality. No abdominopelvic ascites. Abdominal wall hernia. Musculoskeletal: No acute or significant osseous  findings. IMPRESSION: 1. No acute abnormalities of the abdomen or pelvis. 2. Chronic simple appearing cyst arising from the right ovary. 3. Slight chronic fluid in the gallbladder wall, nonspecific. This is unchanged since 2018. Electronically Signed   By: Francene Boyers M.D.   On: 10/28/2018 14:10    Subjective: Seen and examined at bedside and she was feeling better.  Denies chest pain, lightheadedness or dizziness.  No nausea or vomiting.  Able to tolerate soft diet without issues and stable to D/C Home  Discharge Exam: Vitals:   10/29/18 1322 10/29/18 1500  BP: (!) 157/104 120/70  Pulse: (!) 54 (!) 53  Resp:    Temp: 98.7 F (37.1 C) 98.2 F (36.8 C)  SpO2: 100% 98%   Vitals:   10/29/18 0500 10/29/18 0529 10/29/18 1322 10/29/18 1500  BP:  (!) 141/72 (!) 157/104 120/70  Pulse:  64 (!) 54 (!) 53  Resp:  18    Temp:  98.7 F (37.1 C) 98.7 F (37.1 C) 98.2 F (36.8 C)  TempSrc:  Oral Oral Oral  SpO2:  99% 100% 98%  Weight: (!) 136.6 kg     Height:       General: Pt is alert, awake, not in acute distress Cardiovascular: RRR, S1/S2 +, no rubs, no gallops Respiratory: CTA bilaterally, no wheezing, no rhonchi Abdominal: Soft, NT, Distended due to body habitus, bowel sounds + Extremities: no  edema, no cyanosis  The results of significant diagnostics from this hospitalization (including imaging, microbiology, ancillary and laboratory) are listed below for reference.    Microbiology: No results found for this or any previous visit (from the past 240 hour(s)).   Labs: BNP (last 3 results) No results for input(s): BNP in the last 8760 hours. Basic Metabolic Panel: Recent Labs  Lab 10/26/18 1025 10/27/18 1617 10/28/18 1215 10/29/18 0557  NA 140 139 140 141  K 3.2* 3.1* 2.9* 3.3*  CL 109 110 108 109  CO2 17* 23 24 23   GLUCOSE 147* 116* 118* 114*  BUN 14 12 9 7   CREATININE 0.89 0.85 0.76 0.81  CALCIUM 9.1 8.5* 8.7* 8.2*  MG  --   --  2.0 2.3   Liver Function Tests: Recent Labs  Lab 10/26/18 1025 10/27/18 1617 10/28/18 1215 10/29/18 0557  AST 23 20 21 24   ALT 15 15 17 22   ALKPHOS 37* 36* 34* 32*  BILITOT 1.0 1.3* 1.4* 1.2  PROT 7.9 7.6 7.3 6.5  ALBUMIN 4.4 4.4 4.2 3.8   Recent Labs  Lab 10/26/18 1025 10/27/18 1617  LIPASE 26 24   No results for input(s): AMMONIA in the last 168 hours. CBC: Recent Labs  Lab 10/26/18 1025 10/27/18 1617 10/28/18 1215 10/29/18 0557  WBC 14.5* 10.8* 13.8* 12.6*  NEUTROABS  --  9.4* 11.6*  --   HGB 14.2 13.5 12.7 12.4  HCT 40.6 39.8 36.1 36.5  MCV 89.0 91.3 89.6 91.0  PLT 231 199 220 206   Cardiac Enzymes: No results for input(s): CKTOTAL, CKMB, CKMBINDEX, TROPONINI in the last 168 hours. BNP: Invalid input(s): POCBNP CBG: Recent Labs  Lab 10/29/18 0801  GLUCAP 143*   D-Dimer No results for input(s): DDIMER in the last 72 hours. Hgb A1c Recent Labs    10/29/18 0557  HGBA1C 5.1   Lipid Profile No results for input(s): CHOL, HDL, LDLCALC, TRIG, CHOLHDL, LDLDIRECT in the last 72 hours. Thyroid function studies No results for input(s): TSH, T4TOTAL, T3FREE, THYROIDAB in the last 72 hours.  Invalid input(s):  FREET3 Anemia work up No results for input(s): VITAMINB12, FOLATE, FERRITIN, TIBC, IRON,  RETICCTPCT in the last 72 hours. Urinalysis    Component Value Date/Time   COLORURINE YELLOW 10/28/2018 1156   APPEARANCEUR CLEAR 10/28/2018 1156   LABSPEC 1.017 10/28/2018 1156   PHURINE 7.0 10/28/2018 1156   GLUCOSEU NEGATIVE 10/28/2018 1156   HGBUR SMALL (A) 10/28/2018 1156   BILIRUBINUR NEGATIVE 10/28/2018 1156   BILIRUBINUR negative 08/19/2018 1544   BILIRUBINUR small 11/15/2016 0943   KETONESUR NEGATIVE 10/28/2018 1156   PROTEINUR 30 (A) 10/28/2018 1156   UROBILINOGEN 1.0 08/19/2018 1544   UROBILINOGEN 0.2 02/28/2016 1924   NITRITE NEGATIVE 10/28/2018 1156   LEUKOCYTESUR NEGATIVE 10/28/2018 1156   Sepsis Labs Invalid input(s): PROCALCITONIN,  WBC,  LACTICIDVEN Microbiology No results found for this or any previous visit (from the past 240 hour(s)).  Time coordinating discharge: 35 minutes  SIGNED:  Merlene Laughter, DO [facility] 10/29/2018, 7:01 PM Pager is on AMION  If 7PM-7AM, please contact night-coverage [email or web address] Password [email or web address]

## 2018-10-30 ENCOUNTER — Emergency Department (HOSPITAL_COMMUNITY)
Admission: EM | Admit: 2018-10-30 | Discharge: 2018-10-30 | Disposition: A | Discharge status: Home / Self Care | Payer: Self-pay | Attending: Emergency Medicine | Admitting: Emergency Medicine

## 2018-10-30 DIAGNOSIS — J45909 Unspecified asthma, uncomplicated: Secondary | ICD-10-CM | POA: Insufficient documentation

## 2018-10-30 DIAGNOSIS — R1115 Cyclical vomiting syndrome unrelated to migraine: Secondary | ICD-10-CM | POA: Insufficient documentation

## 2018-10-30 DIAGNOSIS — F17210 Nicotine dependence, cigarettes, uncomplicated: Secondary | ICD-10-CM | POA: Insufficient documentation

## 2018-10-30 DIAGNOSIS — R1084 Generalized abdominal pain: Secondary | ICD-10-CM | POA: Insufficient documentation

## 2018-10-30 LAB — I-STAT BETA HCG BLOOD, ED (MC, WL, AP ONLY): I-stat hCG, quantitative: <5 m[IU]/mL (ref <5)

## 2018-10-30 LAB — COMPREHENSIVE METABOLIC PANEL
ALT: 34 U/L (ref 0–44)
AST: 31 U/L (ref 15–41)
Albumin: 4.6 g/dL (ref 3.5–5.0)
Alkaline Phosphatase: 44 U/L (ref 38–126)
Anion gap: 9 (ref 5–15)
BUN: 7 mg/dL (ref 6–20)
CO2: 22 mmol/L (ref 22–32)
Calcium: 9.3 mg/dL (ref 8.9–10.3)
Chloride: 108 mmol/L (ref 98–111)
Creatinine, Ser: 0.89 mg/dL (ref 0.44–1.00)
GFR calc Af Amer: >60 mL/min (ref >60)
GFR calc non Af Amer: >60 mL/min (ref >60)
Glucose, Bld: 105 mg/dL — ABNORMAL HIGH (ref 70–99)
Potassium: 3.2 mmol/L — ABNORMAL LOW (ref 3.5–5.1)
Sodium: 139 mmol/L (ref 135–145)
Total Bilirubin: 1.7 mg/dL — ABNORMAL HIGH (ref 0.3–1.2)
Total Protein: 8 g/dL (ref 6.5–8.1)

## 2018-10-30 LAB — CBC WITH DIFFERENTIAL/PLATELET
Abs Immature Granulocytes: 0.06 10*3/uL (ref 0.00–0.07)
Basophils Absolute: 0 10*3/uL (ref 0.0–0.1)
Basophils Relative: 0 %
Eosinophils Absolute: 0 10*3/uL (ref 0.0–0.5)
Eosinophils Relative: 0 %
HCT: 42.1 % (ref 36.0–46.0)
Hemoglobin: 15 g/dL (ref 12.0–15.0)
Immature Granulocytes: 0 %
Lymphocytes Relative: 11 %
Lymphs Abs: 1.4 10*3/uL (ref 0.7–4.0)
MCH: 31.3 pg (ref 26.0–34.0)
MCHC: 35.6 g/dL (ref 30.0–36.0)
MCV: 87.7 fL (ref 80.0–100.0)
Monocytes Absolute: 0.8 10*3/uL (ref 0.1–1.0)
Monocytes Relative: 6 %
Neutro Abs: 11.1 10*3/uL — ABNORMAL HIGH (ref 1.7–7.7)
Neutrophils Relative %: 83 %
Platelets: 273 10*3/uL (ref 150–400)
RBC: 4.8 MIL/uL (ref 3.87–5.11)
RDW: 12 % (ref 11.5–15.5)
WBC: 13.5 10*3/uL — ABNORMAL HIGH (ref 4.0–10.5)
nRBC: 0 % (ref 0.0–0.2)

## 2018-10-30 LAB — URINALYSIS, ROUTINE W REFLEX MICROSCOPIC
Bilirubin Urine: NEGATIVE
Glucose, UA: NEGATIVE mg/dL
Ketones, ur: NEGATIVE mg/dL
Leukocytes,Ua: NEGATIVE
Nitrite: NEGATIVE
Protein, ur: 100 mg/dL — AB
Specific Gravity, Urine: 1.008 (ref 1.005–1.030)
pH: 7 (ref 5.0–8.0)

## 2018-10-30 LAB — MAGNESIUM: Magnesium: 2.1 mg/dL (ref 1.7–2.4)

## 2018-10-30 LAB — LIPASE, BLOOD: Lipase: 29 U/L (ref 11–51)

## 2018-10-30 MED ORDER — SODIUM CHLORIDE 0.9 % IV BOLUS
1000.0000 mL | Freq: Once | INTRAVENOUS | Status: AC
  Administered 2018-10-30: 17:00:00 1000 mL via INTRAVENOUS

## 2018-10-30 MED ORDER — ONDANSETRON 4 MG PO TBDP: 4.0000 mg | ORAL_TABLET | Freq: Three times a day (TID) | ORAL | 0 refills | Status: DC | PRN

## 2018-10-30 MED ORDER — HALOPERIDOL LACTATE 5 MG/ML IJ SOLN
2.0000 mg | Freq: Once | INTRAMUSCULAR | Status: AC
  Administered 2018-10-30: 16:00:00 2 mg via INTRAVENOUS
  Filled 2018-10-30: qty 1

## 2018-10-30 MED ORDER — ONDANSETRON HCL 4 MG/2ML IJ SOLN
4.0000 mg | Freq: Once | INTRAMUSCULAR | Status: AC
  Administered 2018-10-30: 4 mg via INTRAVENOUS
  Filled 2018-10-30: qty 2

## 2018-10-30 NOTE — ED Notes (Signed)
Bed: HU83 Expected date:  Expected time:  Means of arrival:  Comments: EMS-hyperemesis

## 2018-10-30 NOTE — Discharge Instructions (Signed)
You have been diagnosed today with nausea and vomiting.  At this time there does not appear to be the presence of an emergent medical condition, however there is always the potential for conditions to change. Please read and follow the below instructions.  Please return to the Emergency Department immediately for any new or worsening symptoms. Please be sure to follow up with your Primary Care Provider within one week regarding your visit today; please call their office to schedule an appointment even if you are feeling better for a follow-up visit. You may use the dissolvable Zofran tablets provided to you today for nausea and vomiting.  Do not use your other nausea/vomiting prescriptions as this medication will replace them.  Discontinue use of previous Zofran prescriptions or Phenergan.  Please call your primary care doctor's office tomorrow morning to schedule follow-up regarding your visit today.  You may use diet to improve your mild low potassium level, please refer to attached packet.  Get help right away if: You have pain in your chest, neck, arm, or jaw. You feel very weak or you pass out (faint). You throw up again and again. You have throw up that is bright red or looks like black coffee grounds. You have bloody or black poop (stools) or poop that looks like tar. You have a very bad headache, a stiff neck, or both. You have very bad pain, cramping, or bloating in your belly (abdomen). You have trouble breathing. You are breathing very quickly. Your heart is beating very quickly. Your skin feels cold and clammy. You feel confused. You have signs of losing too much water in your body, such as: Dark pee, very little pee, or no pee. Cracked lips. Dry mouth. Sunken eyes. Sleepiness. Weakness.  Please read the additional information packets attached to your discharge summary.  Do not take your medicine if  develop an itchy rash, swelling in your mouth or lips, or difficulty  breathing.

## 2018-10-30 NOTE — ED Provider Notes (Signed)
[facility]-EMERGENCY DEPT Provider Note   CSN: [id number] Arrival date & time: 10/30/18  1400    History   Chief Complaint Chief Complaint  Patient presents with  . Nausea  . Abdominal Pain    HPI Leslie Yates is a 28 y.o. female with history of cyclical vomiting, obesity, asthma, sickle cell trait presenting today for nausea and vomiting.  Patient was discharged yesterday following overnight observation for similar symptoms.  Patient reports that she was discharged on suppository Phenergan however has been unable to eat or drink since discharge due to vomiting.  Patient reports moderate intensity aching abdominal pain generalized worsened with vomiting and relieved with rest reports as similar to previous visits.  Patient denies fever/chills, chest pain/shortness of breath, cough, diarrhea, dysuria/hematuria, vaginal bleeding/discharge or any additional concerns.  Patient denies any marijuana use in the past several days.  Review of discharge summary shows the patient was admitted for intractable nausea and vomiting, suspect cyclical vomiting with use of THC, she was admitted overnight slowly progressed on diet, given Reglan and counseled on marijuana cessation.  Discharged with antiemetics/suppositories with PCP follow-up in 1 week, additionally with hypokalemia that was repleted IV and p.o. and advised repeat CMP outpatient.  CT ABD/PEVLIS 10/28/18 IMPRESSION: 1. No acute abnormalities of the abdomen or pelvis. 2. Chronic simple appearing cyst arising from the right ovary. 3. Slight chronic fluid in the gallbladder wall, nonspecific. This is unchanged since 2018.    HPI  Past Medical History:  Diagnosis Date  . Asthma   . Asthma   . Obesity   . Ovarian cyst   . Sickle cell trait [facility])     Patient Active Problem List   Diagnosis Date Noted  . Cyclical vomiting, intractable 10/28/2018  . Hypokalemia 10/28/2018  . Tobacco use 10/28/2018  . Cyclical  vomiting with nausea 10/27/2018  . Asthma 10/27/2018  . Complex cyst of right ovary 05/10/2017  . Ovarian cyst, right 11/10/2016  . Nausea with vomiting 11/09/2016    Past Surgical History:  Procedure Laterality Date  . INCISION AND DRAINAGE ABSCESS       OB History    Gravida  0   Para  0   Term  0   Preterm  0   AB  0   Living  0     SAB  0   TAB  0   Ectopic  0   Multiple  0   Live Births  0            Home Medications    Prior to Admission medications   Medication Sig Start Date End Date Taking? Authorizing Provider  acetaminophen (TYLENOL) 325 MG tablet Take 650 mg by mouth every 6 (six) hours as needed for mild pain or headache.   Yes [provider]  potassium chloride (K-DUR) 10 MEQ tablet Take 4 tablets (40 mEq total) by mouth daily for 5 days. 10/27/18 11/01/18 Yes Long, Arlyss Repress, MD  albuterol (PROVENTIL HFA;VENTOLIN HFA) 108 (90 Base) MCG/ACT inhaler Inhale 2 puffs into the lungs every 6 (six) hours as needed for wheezing or shortness of breath. Patient not taking: Reported on 10/27/2018 11/26/16   Lizbeth Bark, FNP  ondansetron (ZOFRAN ODT) 4 MG disintegrating tablet Take 1 tablet (4 mg total) by mouth every 8 (eight) hours as needed for nausea or vomiting. 10/30/18   Harlene Salts A, PA-C  pantoprazole (PROTONIX) 40 MG tablet Take 1 tablet (40 mg total) by mouth  daily. 10/30/18   Marguerita Merles Latif, DO  polyethylene glycol (MIRALAX / GLYCOLAX) 17 g packet Take 17 g by mouth daily as needed for moderate constipation or severe constipation. 10/29/18   Sheikh, Omair Latif, DO  senna-docusate (SENOKOT-S) 8.6-50 MG tablet Take 1 tablet by mouth at bedtime as needed for mild constipation. 10/29/18   Merlene Laughter, DO    Family History Family History  Problem Relation Age of Onset  . Diabetes Mother   . Hypertension Mother   . Diabetes Father   . Hypertension Father     Social History Social History   Tobacco Use  . Smoking  status: Current Every Day Smoker    Packs/day: 1.00    Years: 11.00    Pack years: 11.00    Types: Cigarettes  . Smokeless tobacco: Never Used  Substance Use Topics  . Alcohol use: Yes    Comment: occ  . Drug use: Yes    Types: Benzodiazepines    Comment: Last used: Last Thursday     Allergies   Penicillins   Review of Systems Review of Systems  Constitutional: Negative.  Negative for chills and fever.  Respiratory: Negative.  Negative for cough and shortness of breath.   Cardiovascular: Negative.  Negative for chest pain.  Gastrointestinal: Positive for abdominal pain and nausea. Negative for blood in stool and diarrhea.  Genitourinary: Negative.  Negative for dysuria, hematuria, vaginal bleeding and vaginal discharge.  All other systems reviewed and are negative.  Physical Exam Updated Vital Signs BP (!) 162/86   Pulse (!) 57   Temp 98.3 F (36.8 C) (Oral)   Resp 15   LMP 10/26/2018 Comment: neg preg test 10/27/2018  SpO2 98%   Physical Exam Constitutional:      General: She is not in acute distress.    Appearance: Normal appearance. She is well-developed. She is obese. She is not ill-appearing or diaphoretic.  HENT:     Head: Normocephalic and atraumatic.     Right Ear: External ear normal.     Left Ear: External ear normal.     Nose: Nose normal.  Eyes:     General: Vision grossly intact. Gaze aligned appropriately.     Pupils: Pupils are equal, round, and reactive to light.  Neck:     Musculoskeletal: Normal range of motion.     Trachea: Trachea and phonation normal. No tracheal deviation.  Cardiovascular:     Rate and Rhythm: Normal rate and regular rhythm.     Heart sounds: Normal heart sounds.  Pulmonary:     Effort: Pulmonary effort is normal. No respiratory distress.     Breath sounds: Normal breath sounds.  Abdominal:     General: Bowel sounds are normal. There is no distension.     Palpations: Abdomen is soft.     Tenderness: There is  generalized abdominal tenderness. There is no guarding or rebound.  Genitourinary:    Comments: Examination deferred by patient. Musculoskeletal: Normal range of motion.  Skin:    General: Skin is warm and dry.  Neurological:     Mental Status: She is alert.     GCS: GCS eye subscore is 4. GCS verbal subscore is 5. GCS motor subscore is 6.     Comments: Speech is clear and goal oriented, follows commands Major Cranial nerves without deficit, no facial droop Moves extremities without ataxia, coordination intact Normal gait  Psychiatric:        Behavior: Behavior normal.  ED Treatments / Results  Labs (all labs ordered are listed, but only abnormal results are displayed) Labs Reviewed  CBC WITH DIFFERENTIAL/PLATELET - Abnormal; Notable for the following components:      Result Value   WBC 13.5 (*)    Neutro Abs 11.1 (*)    All other components within normal limits  COMPREHENSIVE METABOLIC PANEL - Abnormal; Notable for the following components:   Potassium 3.2 (*)    Glucose, Bld 105 (*)    Total Bilirubin 1.7 (*)    All other components within normal limits  URINALYSIS, ROUTINE W REFLEX MICROSCOPIC - Abnormal; Notable for the following components:   APPearance HAZY (*)    Hgb urine dipstick SMALL (*)    Protein, ur 100 (*)    Bacteria, UA RARE (*)    All other components within normal limits  LIPASE, BLOOD  MAGNESIUM  I-STAT BETA HCG BLOOD, ED ([facility], [facility], [facility] ONLY)    EKG EKG Interpretation  Date/Time:  Thursday Oct 30 2018 17:09:31 EDT Ventricular Rate:  60 PR Interval:    QRS Duration: 86 QT Interval:  444 QTC Calculation: 444 R Axis:   47 Text Interpretation:  Sinus rhythm No significant change was found Confirmed by Azalia Bilis ([id number]) on 10/30/2018 5:20:40 PM   Radiology No results found.  Procedures Procedures (including critical care time)  Medications Ordered in ED Medications  haloperidol lactate (HALDOL) injection 2 mg (2 mg Intravenous Given  10/30/18 1607)  sodium chloride 0.9 % bolus 1,000 mL (1,000 mLs Intravenous New Bag/Given 10/30/18 1710)  ondansetron (ZOFRAN) injection 4 mg (4 mg Intravenous Given 10/30/18 1717)     Initial Impression / Assessment and Plan / ED Course  I have reviewed the triage vital signs and the nursing notes.  Pertinent labs & imaging results that were available during my care of the patient were reviewed by me and considered in my medical decision making (see chart for details).     Initial examination, patient ambulating around room actively vomiting, generalized abdominal pain, no rebound, distention or peritoneal signs.  2 mg Haldol ordered.  Fluid bolus ordered. - Patient reassessed sitting comfortably in bed still endorsing nausea, watery emesis without blood or bile present in emesis bag, EKG ordered and without QT prolongation, 4 mg Zofran ordered. - CBC with leukocytosis of 13.5, appears similar to prior suspect secondary to nausea and vomiting do not suspect acute bacterial Lipase within normal limits CMP with potassium 3.2, bilirubin 1.7, similar to prior Magnesium 2.1 EKG without significant change reviewed with Dr. Patria Mane Urinalysis with small hemoglobin, protein, rare bacteria, without urinary symptoms doubt UTI - Patient has been provided with [place] sandwich and water today.  She has eaten and drank without difficulty, nausea or vomiting.  She is now ambulating around the nurses station and requesting discharge.  She will be changed from suppository Phenergan to ODT Zofran, encouraged to follow-up with PCP.  Reexamine of the abdomen reveals soft nontender abdomen without distention or peritoneal signs.  Suspect patient with recurrence of her cyclical vomiting today she has no abdominal tenderness on reexamination and CT scan performed 2 days ago without acute findings she has no change in the nature of her pain when she arrived in no pain at this time.  Doubt appendicitis, cholecystitis,  diverticulitis, obstruction or other acute abdominopelvic etiology of patient's symptoms.  Additionally low suspicion for PID, ectopic or torsion as patient is without pelvic pain and denies GU symptoms, she deferred examination today which is  reasonable and she states understanding.  At this time there does not appear to be any evidence of an acute emergency medical condition and the patient appears stable for discharge with appropriate outpatient follow up. Diagnosis was discussed with patient who verbalizes understanding of care plan and is agreeable to discharge. I have discussed return precautions with patient who verbalizes understanding of return precautions. Patient encouraged to follow-up with their PCP. All questions answered.  Patient has been discharged in good condition.  Note: Portions of this report may have been transcribed using voice recognition software. Every effort was made to ensure accuracy; however, inadvertent computerized transcription errors may still be present. Final Clinical Impressions(s) / ED Diagnoses   Final diagnoses:  Cyclical vomiting with nausea    ED Discharge Orders         Ordered    ondansetron (ZOFRAN ODT) 4 MG disintegrating tablet  Every 8 hours PRN     10/30/18 1847           Elizabeth PalauMorelli, Nnaemeka Samson A, PA-C 10/30/18 Tora [id number]    Campos, Kevin, MD 10/30/18 1943

## 2018-10-30 NOTE — ED Triage Notes (Signed)
Pt BIBA from home c/o emesis since being d/c from [facility] yesterday.  Pt reports taking suppository phenergan prior to EMS arrival.  Per EMS, pt with no c/o n/v en route. Pt ambulatory on scene.

## 2018-10-31 ENCOUNTER — Other Ambulatory Visit: Payer: Self-pay | Admitting: Nurse Practitioner

## 2018-12-22 ENCOUNTER — Other Ambulatory Visit: Payer: Self-pay | Admitting: Nurse Practitioner

## 2019-03-18 ENCOUNTER — Other Ambulatory Visit: Payer: Self-pay | Admitting: Nurse Practitioner

## 2019-04-21 ENCOUNTER — Other Ambulatory Visit: Payer: Self-pay

## 2019-04-21 ENCOUNTER — Encounter (HOSPITAL_COMMUNITY): Payer: Self-pay | Admitting: Emergency Medicine

## 2019-04-21 ENCOUNTER — Emergency Department (HOSPITAL_COMMUNITY): Admission: EM | Admit: 2019-04-21 | Discharge: 2019-04-21 | Discharge status: Absent without leave | Payer: Self-pay

## 2019-04-21 DIAGNOSIS — F17210 Nicotine dependence, cigarettes, uncomplicated: Secondary | ICD-10-CM | POA: Insufficient documentation

## 2019-04-21 DIAGNOSIS — Z79899 Other long term (current) drug therapy: Secondary | ICD-10-CM | POA: Insufficient documentation

## 2019-04-21 DIAGNOSIS — F1310 Sedative, hypnotic or anxiolytic abuse, uncomplicated: Secondary | ICD-10-CM | POA: Insufficient documentation

## 2019-04-21 DIAGNOSIS — R1032 Left lower quadrant pain: Secondary | ICD-10-CM | POA: Insufficient documentation

## 2019-04-21 DIAGNOSIS — Z3201 Encounter for pregnancy test, result positive: Secondary | ICD-10-CM | POA: Insufficient documentation

## 2019-04-21 DIAGNOSIS — J45909 Unspecified asthma, uncomplicated: Secondary | ICD-10-CM | POA: Insufficient documentation

## 2019-04-21 LAB — HCG, QUANTITATIVE, PREGNANCY: hCG, Beta Chain, Quant, S: 440 m[IU]/mL — ABNORMAL HIGH (ref <5)

## 2019-04-21 NOTE — ED Triage Notes (Signed)
Patient here from home reporting possible pregnancy. Lower pelvic pain x2 week.

## 2019-04-22 ENCOUNTER — Emergency Department (HOSPITAL_COMMUNITY)
Admission: EM | Admit: 2019-04-22 | Discharge: 2019-04-22 | Discharge status: Against Medical Advice | Payer: Self-pay | Attending: Emergency Medicine | Admitting: Emergency Medicine

## 2019-04-22 DIAGNOSIS — Z3201 Encounter for pregnancy test, result positive: Secondary | ICD-10-CM

## 2019-04-22 MED ORDER — PRENATAL COMPLETE 14-0.4 MG PO TABS: 1.0000 | ORAL_TABLET | Freq: Every day | ORAL | 0 refills | Status: DC

## 2019-04-22 NOTE — Discharge Instructions (Signed)
Thank you for allowing me to care for you today in the Emergency Department.   You had a positive pregnancy test today in the ER.  Start taking 1 prenatal vitamin daily.  Call the OB/GYN's office above to get established with an OB/GYN.  I would recommend calling early to get set up with an appointment.   Avoid medications, such as NSAID, including ibuprofen, Advil, aspirin, Aleve.  You can take 650 mg of Tylenol every 6 hours as needed for any pain.  You should return to the ER, or specifically to the women's and children's observation unit at [facility], if you develop severe abdominal pain, vaginal bleeding or discharge, fevers or chills, persistent vomiting, or other new, concerning symptoms.

## 2019-04-22 NOTE — ED Provider Notes (Signed)
[facility]   CSN: [id number] Arrival date & time: 04/21/19  2003     History   Chief Complaint Chief Complaint  Patient presents with  . Possible Pregnancy  . Pelvic Pain    HPI Leslie Yates is a 28 y.o. female with a history of asthma, obesity, ovarian cyst, and sickle cell trait who presents to the emergency department with a chief complaint of positive pregnancy test at home.  The patient reports that she has taken 3+ pregnancy test at home over the last few days.  Her last menstrual cycle began on September 21.  She is not currently established with an OB/GYN.  No history of previous pregnancies.  She does not take any form of birth control.  She does report that she has been having some mild, dull, left lower quadrant abdominal pain for the last few weeks.  She reports a history of chronic left lower quadrant abdominal pain as she was previously diagnosed with a large ovarian cyst.  She reports that the current pain feels similar to pain associated with her ovarian cyst.  She denies nausea, vomiting, diarrhea, dysuria, hematuria, vaginal discharge or bleeding, or constipation.      The history is provided by the patient. No language interpreter was used.    Past Medical History:  Diagnosis Date  . Asthma   . Asthma   . Obesity   . Ovarian cyst   . Sickle cell trait [facility])     Patient Active Problem List   Diagnosis Date Noted  . Cyclical vomiting, intractable 10/28/2018  . Hypokalemia 10/28/2018  . Tobacco use 10/28/2018  . Cyclical vomiting with nausea 10/27/2018  . Asthma 10/27/2018  . Complex cyst of right ovary 05/10/2017  . Ovarian cyst, right 11/10/2016  . Nausea with vomiting 11/09/2016    Past Surgical History:  Procedure Laterality Date  . INCISION AND DRAINAGE ABSCESS       OB History    Gravida  0   Para  0   Term  0   Preterm  0   AB  0   Living  0     SAB  0   TAB  0   Ectopic   0   Multiple  0   Live Births  0            Home Medications    Prior to Admission medications   Medication Sig Start Date End Date Taking? Authorizing Provider  acetaminophen (TYLENOL) 325 MG tablet Take 650 mg by mouth every 6 (six) hours as needed for mild pain or headache.    [provider]  ondansetron (ZOFRAN ODT) 4 MG disintegrating tablet Take 1 tablet (4 mg total) by mouth every 8 (eight) hours as needed for nausea or vomiting. 10/30/18   Harlene Salts A, PA-C  potassium chloride (K-DUR) 10 MEQ tablet Take 4 tablets (40 mEq total) by mouth daily for 5 days. 10/27/18 11/01/18  Long, Arlyss Repress, MD  Prenatal Vit-Fe Fumarate-FA (PRENATAL COMPLETE) 14-0.4 MG TABS Take 1 tablet by mouth daily. 04/22/19   Sarahy Creedon A, PA-C  albuterol (PROVENTIL HFA;VENTOLIN HFA) 108 (90 Base) MCG/ACT inhaler Inhale 2 puffs into the lungs every 6 (six) hours as needed for wheezing or shortness of breath. Patient not taking: Reported on 10/27/2018 11/26/16 04/22/19  Lizbeth Bark, FNP  pantoprazole (PROTONIX) 40 MG tablet Take 1 tablet (40 mg total) by mouth daily. 10/30/18 04/22/19  Marguerita Merles  Latif, DO    Family History Family History  Problem Relation Age of Onset  . Diabetes Mother   . Hypertension Mother   . Diabetes Father   . Hypertension Father     Social History Social History   Tobacco Use  . Smoking status: Current Every Day Smoker    Packs/day: 1.00    Years: 11.00    Pack years: 11.00    Types: Cigarettes  . Smokeless tobacco: Never Used  Substance Use Topics  . Alcohol use: Yes    Comment: occ  . Drug use: Yes    Types: Benzodiazepines    Comment: Last used: Last Thursday     Allergies   Penicillins   Review of Systems Review of Systems  Constitutional: Negative for activity change, chills and fever.  Eyes: Negative for visual disturbance.  Respiratory: Negative for shortness of breath.   Cardiovascular: Negative for chest pain.   Gastrointestinal: Negative for abdominal pain, anal bleeding, blood in stool, diarrhea, nausea and vomiting.  Genitourinary: Positive for pelvic pain. Negative for decreased urine volume, dysuria, flank pain, frequency, hematuria, menstrual problem, urgency, vaginal bleeding, vaginal discharge and vaginal pain.  Musculoskeletal: Negative for back pain, myalgias, neck pain and neck stiffness.  Skin: Negative for rash.  Allergic/Immunologic: Negative for immunocompromised state.  Neurological: Negative for dizziness, seizures, syncope, weakness, numbness and headaches.  Psychiatric/Behavioral: Negative for confusion.     Physical Exam Updated Vital Signs BP 124/76 (BP Location: Right Arm)   Pulse 86   Temp 98.5 F (36.9 C) (Oral)   Resp 19   Ht 6' (1.829 m)   Wt (!) 140.6 kg   SpO2 99%   BMI 42.04 kg/m   Physical Exam Vitals signs and nursing note reviewed.  Constitutional:      General: She is not in acute distress.    Appearance: She is not ill-appearing, toxic-appearing or diaphoretic.  HENT:     Head: Normocephalic.  Eyes:     Conjunctiva/sclera: Conjunctivae normal.  Neck:     Musculoskeletal: Neck supple.  Cardiovascular:     Rate and Rhythm: Normal rate and regular rhythm.     Heart sounds: No murmur. No friction rub. No gallop.   Pulmonary:     Effort: Pulmonary effort is normal. No respiratory distress.     Breath sounds: No stridor. No wheezing, rhonchi or rales.  Chest:     Chest wall: No tenderness.  Abdominal:     General: There is no distension.     Palpations: Abdomen is soft. There is no mass.     Tenderness: There is no abdominal tenderness. There is no right CVA tenderness, left CVA tenderness, guarding or rebound.     Hernia: No hernia is present.     Comments: Abdomen is soft, nontender, nondistended.  Skin:    General: Skin is warm.     Findings: No rash.  Neurological:     Mental Status: She is alert.  Psychiatric:        Behavior: Behavior  normal.      ED Treatments / Results  Labs (all labs ordered are listed, but only abnormal results are displayed) Labs Reviewed  HCG, QUANTITATIVE, PREGNANCY - Abnormal; Notable for the following components:      Result Value   hCG, Beta Chain, Quant, S 440 (*)    All other components within normal limits    EKG None  Radiology No results found.  Procedures Procedures (including critical care time)  Medications  Ordered in ED Medications - No data to display   Initial Impression / Assessment and Plan / ED Course  I have reviewed the triage vital signs and the nursing notes.  Pertinent labs & imaging results that were available during my care of the patient were reviewed by me and considered in my medical decision making (see chart for details).        28 year old female with a history of asthma, obesity, ovarian cyst, and sickle cell trait presenting to the ER after having 3 at home pregnancy test that are positive over the last few days.  Her last menstrual period was September 21.  She is not on any form of birth control.  Quantitative hCG is 440.  This result has been shared with the patient.  On arrival into the room, the patient is frustrated with her length of stay in the ER and states " I am going to file a formal complaint and all I want are my paper showing that I am pregnant."  She was agreeable to abdominal exam, which was unremarkable.  Offered a pelvic exam and urinalysis, but the patient did not wish to wait in the ER for any longer.  I reviewed her previous imaging that did demonstrate a large cystic lesion that appeared to come from the left ovary, that appears to have shrunk from 11 cm to 9 cm when last evaluated in May.  Given the chronicity of her pain with unchanged quality, doubt ectopic pregnancy or spontaneous abortion.  She will be given an OB/GYN referral and I have advised her to call tomorrow to schedule a follow-up appointment.  She does not have any  concerns for GU infections, declines pelvic exam and urinalysis in the ER she would like to go home.  We will start the patient on prenatal vitamins.  I provided basic prenatal education, including avoiding NSAIDs.  All questions answered.  She is hemodynamically stable to no acute distress.  Safe for discharge to home with OB/GYN follow-up.  Final Clinical Impressions(s) / ED Diagnoses   Final diagnoses:  Positive pregnancy test    ED Discharge Orders         Ordered    Prenatal Vit-Fe Fumarate-FA (PRENATAL COMPLETE) 14-0.4 MG TABS  Daily     04/22/19 0317           Barkley BoardsMcDonald, Nyomie Ehrlich A, PA-C 04/22/19 0349    Nira Connardama, Pedro Eduardo, MD 04/22/19 [phone]

## 2019-04-30 ENCOUNTER — Ambulatory Visit: Payer: Self-pay

## 2019-06-01 ENCOUNTER — Emergency Department (HOSPITAL_COMMUNITY)
Admission: EM | Admit: 2019-06-01 | Discharge: 2019-06-01 | Disposition: A | Discharge status: Home / Self Care | Payer: Medicaid Other | Attending: Emergency Medicine | Admitting: Emergency Medicine

## 2019-06-01 ENCOUNTER — Encounter: Payer: Self-pay | Admitting: General Practice

## 2019-06-01 ENCOUNTER — Encounter (HOSPITAL_COMMUNITY): Payer: Self-pay | Admitting: Obstetrics and Gynecology

## 2019-06-01 ENCOUNTER — Telehealth: Payer: Self-pay | Admitting: General Practice

## 2019-06-01 ENCOUNTER — Ambulatory Visit (INDEPENDENT_AMBULATORY_CARE_PROVIDER_SITE_OTHER): Payer: Self-pay | Admitting: *Deleted

## 2019-06-01 ENCOUNTER — Other Ambulatory Visit: Payer: Self-pay

## 2019-06-01 DIAGNOSIS — O26891 Other specified pregnancy related conditions, first trimester: Secondary | ICD-10-CM | POA: Diagnosis not present

## 2019-06-01 DIAGNOSIS — Z3491 Encounter for supervision of normal pregnancy, unspecified, first trimester: Secondary | ICD-10-CM

## 2019-06-01 DIAGNOSIS — Z3A13 13 weeks gestation of pregnancy: Secondary | ICD-10-CM

## 2019-06-01 DIAGNOSIS — Z3A12 12 weeks gestation of pregnancy: Secondary | ICD-10-CM | POA: Diagnosis not present

## 2019-06-01 DIAGNOSIS — Z3A00 Weeks of gestation of pregnancy not specified: Secondary | ICD-10-CM | POA: Diagnosis not present

## 2019-06-01 DIAGNOSIS — R1013 Epigastric pain: Secondary | ICD-10-CM | POA: Diagnosis not present

## 2019-06-01 DIAGNOSIS — O99891 Other specified diseases and conditions complicating pregnancy: Secondary | ICD-10-CM | POA: Diagnosis not present

## 2019-06-01 DIAGNOSIS — R109 Unspecified abdominal pain: Secondary | ICD-10-CM | POA: Diagnosis not present

## 2019-06-01 DIAGNOSIS — O0990 Supervision of high risk pregnancy, unspecified, unspecified trimester: Secondary | ICD-10-CM | POA: Insufficient documentation

## 2019-06-01 DIAGNOSIS — Z3400 Encounter for supervision of normal first pregnancy, unspecified trimester: Secondary | ICD-10-CM

## 2019-06-01 DIAGNOSIS — Z3401 Encounter for supervision of normal first pregnancy, first trimester: Secondary | ICD-10-CM

## 2019-06-01 LAB — CBC
HCT: 33.5 % — ABNORMAL LOW (ref 36.0–46.0)
Hemoglobin: 11.8 g/dL — ABNORMAL LOW (ref 12.0–15.0)
MCH: 31.7 pg (ref 26.0–34.0)
MCHC: 35.2 g/dL (ref 30.0–36.0)
MCV: 90.1 fL (ref 80.0–100.0)
Platelets: 197 10*3/uL (ref 150–400)
RBC: 3.72 MIL/uL — ABNORMAL LOW (ref 3.87–5.11)
RDW: 12.1 % (ref 11.5–15.5)
WBC: 10.2 10*3/uL (ref 4.0–10.5)
nRBC: 0 % (ref 0.0–0.2)

## 2019-06-01 LAB — COMPREHENSIVE METABOLIC PANEL
ALT: 11 U/L (ref 0–44)
AST: 11 U/L — ABNORMAL LOW (ref 15–41)
Albumin: 3.7 g/dL (ref 3.5–5.0)
Alkaline Phosphatase: 34 U/L — ABNORMAL LOW (ref 38–126)
Anion gap: 8 (ref 5–15)
BUN: 6 mg/dL (ref 6–20)
CO2: 24 mmol/L (ref 22–32)
Calcium: 8.9 mg/dL (ref 8.9–10.3)
Chloride: 105 mmol/L (ref 98–111)
Creatinine, Ser: 0.66 mg/dL (ref 0.44–1.00)
GFR calc Af Amer: >60 mL/min (ref >60)
GFR calc non Af Amer: >60 mL/min (ref >60)
Glucose, Bld: 101 mg/dL — ABNORMAL HIGH (ref 70–99)
Potassium: 3.5 mmol/L (ref 3.5–5.1)
Sodium: 137 mmol/L (ref 135–145)
Total Bilirubin: 0.4 mg/dL (ref 0.3–1.2)
Total Protein: 6.6 g/dL (ref 6.5–8.1)

## 2019-06-01 LAB — URINALYSIS, ROUTINE W REFLEX MICROSCOPIC
Bilirubin Urine: NEGATIVE
Glucose, UA: NEGATIVE mg/dL
Hgb urine dipstick: NEGATIVE
Ketones, ur: NEGATIVE mg/dL
Leukocytes,Ua: NEGATIVE
Nitrite: NEGATIVE
Protein, ur: NEGATIVE mg/dL
Specific Gravity, Urine: 1.011 (ref 1.005–1.030)
pH: 7 (ref 5.0–8.0)

## 2019-06-01 LAB — HCG, QUANTITATIVE, PREGNANCY: hCG, Beta Chain, Quant, S: 120633 m[IU]/mL — ABNORMAL HIGH (ref <5)

## 2019-06-01 LAB — LIPASE, BLOOD: Lipase: 24 U/L (ref 11–51)

## 2019-06-01 MED ORDER — SODIUM CHLORIDE 0.9% FLUSH: 3.0000 mL | Freq: Once | INTRAVENOUS | Status: DC

## 2019-06-01 MED ORDER — ONDANSETRON HCL 4 MG PO TABS: 4.0000 mg | ORAL_TABLET | Freq: Three times a day (TID) | ORAL | 0 refills | Status: DC | PRN

## 2019-06-01 NOTE — ED Triage Notes (Signed)
Patient reports she was seen back in October and told she was pregnant and given information for follow up but was unable to follow up.  Patient reports abdominal pain, emesis, and constipation today.  Patient reports she is approximately 12 weeks she believes.

## 2019-06-01 NOTE — Discharge Instructions (Addendum)
Check your voicemail. You should have received a message concerning an ultrasound appointment scheduled on 06/10/19 at 10:00 am and an additional appointment on 06/12/19 at 8:30 am.

## 2019-06-01 NOTE — ED Provider Notes (Signed)
[facility]-EMERGENCY DEPT Provider Note   CSN: [id number] Arrival date & time: 06/01/19  1158     History   Chief Complaint Chief Complaint  Patient presents with  . Routine Prenatal Visit  . Emesis    HPI OLEAN SANGSTER is a 28 y.o. female.     HPI 28 year old female with numerous complaints.  She is primarily concerned because she is an late first trimester of her first pregnancy.  This is based on dates.  She thinks her last menstrual period was sometime in September.  She reports intermittent periumbilical to epigastric pain after eating.  This has been ongoing for the past 2 to 3 weeks.  Some intermittent nausea.  She has yet to establish OB care at this point.  No vaginal bleeding or discharge.  No urinary complaints.  Past Medical History:  Diagnosis Date  . Asthma   . Asthma   . Obesity   . Ovarian cyst   . Sickle cell trait [facility])     Patient Active Problem List   Diagnosis Date Noted  . Supervision of normal first pregnancy, antepartum 06/01/2019  . Cyclical vomiting, intractable 10/28/2018  . Hypokalemia 10/28/2018  . Tobacco use 10/28/2018  . Cyclical vomiting with nausea 10/27/2018  . Asthma 10/27/2018  . Complex cyst of right ovary 05/10/2017  . Ovarian cyst, right 11/10/2016  . Nausea with vomiting 11/09/2016   Past Surgical History:  Procedure Laterality Date  . INCISION AND DRAINAGE ABSCESS      OB History    Gravida  1   Para  0   Term  0   Preterm  0   AB  0   Living  0     SAB  0   TAB  0   Ectopic  0   Multiple  0   Live Births  0           Home Medications    Prior to Admission medications   Medication Sig Start Date End Date Taking? Authorizing Provider  acetaminophen (TYLENOL) 325 MG tablet Take 650 mg by mouth every 6 (six) hours as needed for mild pain or headache.    [provider]  ondansetron (ZOFRAN ODT) 4 MG disintegrating tablet Take 1 tablet (4 mg total) by mouth every 8  (eight) hours as needed for nausea or vomiting. 10/30/18   Harlene Salts A, PA-C  potassium chloride (K-DUR) 10 MEQ tablet Take 4 tablets (40 mEq total) by mouth daily for 5 days. 10/27/18 11/01/18  Long, Arlyss Repress, MD  Prenatal Vit-Fe Fumarate-FA (PRENATAL COMPLETE) 14-0.4 MG TABS Take 1 tablet by mouth daily. 04/22/19   McDonald, Mia A, PA-C  albuterol (PROVENTIL HFA;VENTOLIN HFA) 108 (90 Base) MCG/ACT inhaler Inhale 2 puffs into the lungs every 6 (six) hours as needed for wheezing or shortness of breath. Patient not taking: Reported on 10/27/2018 11/26/16 04/22/19  Lizbeth Bark, FNP  pantoprazole (PROTONIX) 40 MG tablet Take 1 tablet (40 mg total) by mouth daily. 10/30/18 04/22/19  Merlene Laughter, DO   Family History Family History  Problem Relation Age of Onset  . Diabetes Mother   . Hypertension Mother   . Diabetes Father   . Hypertension Father    Social History Social History   Tobacco Use  . Smoking status: Current Every Day Smoker    Packs/day: 1.00    Years: 11.00    Pack years: 11.00    Types: Cigarettes  . Smokeless tobacco:  Never Used  Substance Use Topics  . Alcohol use: Not Currently    Comment: occ  . Drug use: Not Currently    Types: Benzodiazepines    Comment: Last used: Last Thursday   Allergies   Penicillins  Review of Systems Review of Systems  All systems reviewed and negative, other than as noted in HPI.  Physical Exam Updated Vital Signs BP 138/82 (BP Location: Left Arm)   Pulse 82   Temp 97.9 F (36.6 C)   Resp 16   LMP 02/28/2019 (Approximate) Comment: neg preg test 10/27/2018  SpO2 100%   Physical Exam Vitals signs and nursing note reviewed.  Constitutional:      General: She is not in acute distress.    Appearance: She is well-developed. She is obese.  HENT:     Head: Normocephalic and atraumatic.  Eyes:     General:        Right eye: No discharge.        Left eye: No discharge.     Conjunctiva/sclera: Conjunctivae normal.   Neck:     Musculoskeletal: Neck supple.  Cardiovascular:     Rate and Rhythm: Normal rate and regular rhythm.     Heart sounds: Normal heart sounds. No murmur. No friction rub. No gallop.   Pulmonary:     Effort: Pulmonary effort is normal. No respiratory distress.     Breath sounds: Normal breath sounds.  Abdominal:     General: There is no distension.     Palpations: Abdomen is soft.     Tenderness: There is no abdominal tenderness.  Musculoskeletal:        General: No tenderness.  Skin:    General: Skin is warm and dry.  Neurological:     Mental Status: She is alert.  Psychiatric:        Behavior: Behavior normal.        Thought Content: Thought content normal.      ED Treatments / Results  Labs (all labs ordered are listed, but only abnormal results are displayed) Labs Reviewed  COMPREHENSIVE METABOLIC PANEL - Abnormal; Notable for the following components:      Result Value   Glucose, Bld 101 (*)    AST 11 (*)    Alkaline Phosphatase 34 (*)    All other components within normal limits  CBC - Abnormal; Notable for the following components:   RBC 3.72 (*)    Hemoglobin 11.8 (*)    HCT 33.5 (*)    All other components within normal limits  HCG, QUANTITATIVE, PREGNANCY - Abnormal; Notable for the following components:   hCG, Beta Chain, Quant, S [id number] (*)    All other components within normal limits  LIPASE, BLOOD  URINALYSIS, ROUTINE W REFLEX MICROSCOPIC   EKG None  Radiology No results found.  Procedures Procedures (including critical care time)  Medications Ordered in ED Medications  sodium chloride flush (NS) 0.9 % injection 3 mL (has no administration in time range)    Initial Impression / Assessment and Plan / ED Course  I have reviewed the triage vital signs and the nursing notes.  Pertinent labs & imaging results that were available during my care of the patient were reviewed by me and considered in my medical decision making (see chart for  details).        28 year old female with numerous complaints.  I suspect primarily anxiety related to first pregnancy.  Some intermittent abdominal pain.  None currently and she has  benign abdominal exam.  Denies any vaginal bleeding/discharge or urinary complaints.  Biliary colic?  Reflux?  Advised to try Pepcid.  Prescription for Zofran.  Phone conversations noted.  Apparently she has a ultrasound and a follow-up appointment scheduled within the next couple weeks.  Patient was advised of this and that she needs to check her voicemail.  Return precautions were discussed.  I doubt emergent complication related to her pregnancy or otherwise.  Final Clinical Impressions(s) / ED Diagnoses   Final diagnoses:  First trimester pregnancy    ED Discharge Orders    None       Virgel Manifold, MD 06/02/19 [id number]

## 2019-06-01 NOTE — Telephone Encounter (Signed)
Left message on VM in regards to [place] appt scheduled on 06/09/2019 at 10:00am.  Also, NOB scheduled on 06/12/2019 at 8:30am.  Asked pt to give our office a call with any questions or concerns.

## 2019-06-01 NOTE — Progress Notes (Signed)
  Virtual Visit via Telephone Note  I connected with [name] on 06/01/19 at  9:10 AM EST by telephone and verified that I am speaking with the correct person using two identifiers.  Location: Patient: Bernardette Yates MRN: [id number] Provider: Derl Barrow, RN   I discussed the limitations, risks, security and privacy concerns of performing an evaluation and management service by telephone and the availability of in person appointments. I also discussed with the patient that there may be a patient responsible charge related to this service. The patient expressed understanding and agreed to proceed.   History of Present Illness: PRENATAL INTAKE SUMMARY  [name] presents today New OB Nurse Interview.  OB History    Gravida  1   Para  0   Term  0   Preterm  0   AB  0   Living  0     SAB  0   TAB  0   Ectopic  0   Multiple  0   Live Births  0          I have reviewed the patient's medical, obstetrical, social, and family histories, medications, and available lab results.  SUBJECTIVE She complains of constipation and nausea after taking PNV.   Observations/Objective: Initial nurse interview for history/labs (New OB)  EDD: 12/05/2019 by LMP?? Patient unsure of LMP. GA: [redacted]w[redacted]d G1P0 FHT: non face to face interview  GENERAL APPEARANCE: non face to face interview  Assessment and Plan: Normal pregnancy Prenatal care-[facility] Labs/physical to be completed at next appointment with midwife Ultrasound MFM less than 14 weeks order to confirm dating. Advised to continue PNV Patient purchased Colace over the counter for constipation; increase fluid (water) intake and fiber. Patient to take Zofran to help with nausea Enroll in Babyscripts   Follow Up Instructions:   I discussed the assessment and treatment plan with the patient. The patient was provided an opportunity to ask questions and all were answered. The patient agreed with the plan and demonstrated  an understanding of the instructions.   The patient was advised to call back or seek an in-person evaluation if the symptoms worsen or if the condition fails to improve as anticipated.  I provided 20 minutes of non-face-to-face time during this encounter.   Derl Barrow, RN

## 2019-06-09 ENCOUNTER — Other Ambulatory Visit: Payer: Self-pay

## 2019-06-09 ENCOUNTER — Encounter: Payer: Self-pay | Admitting: Family Medicine

## 2019-06-09 ENCOUNTER — Ambulatory Visit (HOSPITAL_COMMUNITY)
Admission: RE | Admit: 2019-06-09 | Discharge: 2019-06-09 | Disposition: A | Discharge status: Home / Self Care | Payer: Medicaid Other | Source: Ambulatory Visit | Attending: Obstetrics and Gynecology | Admitting: Obstetrics and Gynecology

## 2019-06-09 ENCOUNTER — Ambulatory Visit (INDEPENDENT_AMBULATORY_CARE_PROVIDER_SITE_OTHER): Payer: Self-pay | Admitting: *Deleted

## 2019-06-09 DIAGNOSIS — Z3400 Encounter for supervision of normal first pregnancy, unspecified trimester: Secondary | ICD-10-CM | POA: Diagnosis not present

## 2019-06-09 DIAGNOSIS — Z712 Person consulting for explanation of examination or test findings: Secondary | ICD-10-CM

## 2019-06-09 NOTE — Progress Notes (Signed)
Patient seen and assessed by nursing staff.  Agree with documentation and plan.  

## 2019-06-09 NOTE — Progress Notes (Signed)
Ultrasound results reviewed with Dr. Kennon Rounds. Pt informed of viable pregnancy and EDD 12/24/19.  Pt expressed nausea with prenatal vitamins - advised to try taking @ evening or bedtime. [place] pictures given. Pt also advised that the cyst which was previously seen on CT in May is still present and unchanged. She voiced understanding of all information and instructions given.  Pt has scheduled appt @ [facility] on 06/12/19 for New Ob.

## 2019-06-12 ENCOUNTER — Other Ambulatory Visit (HOSPITAL_COMMUNITY)
Admission: RE | Admit: 2019-06-12 | Discharge: 2019-06-12 | Disposition: A | Discharge status: Home / Self Care | Payer: Medicaid Other | Source: Ambulatory Visit

## 2019-06-12 ENCOUNTER — Ambulatory Visit (INDEPENDENT_AMBULATORY_CARE_PROVIDER_SITE_OTHER): Payer: Self-pay

## 2019-06-12 ENCOUNTER — Other Ambulatory Visit: Payer: Self-pay

## 2019-06-12 VITALS — BP 108/58 | HR 62 | Temp 97.8°F | Wt 260.4 lb

## 2019-06-12 DIAGNOSIS — Z3A11 11 weeks gestation of pregnancy: Secondary | ICD-10-CM

## 2019-06-12 DIAGNOSIS — Z3400 Encounter for supervision of normal first pregnancy, unspecified trimester: Secondary | ICD-10-CM | POA: Diagnosis not present

## 2019-06-12 DIAGNOSIS — O99210 Obesity complicating pregnancy, unspecified trimester: Secondary | ICD-10-CM

## 2019-06-12 DIAGNOSIS — O99211 Obesity complicating pregnancy, first trimester: Secondary | ICD-10-CM

## 2019-06-12 DIAGNOSIS — N63 Unspecified lump in unspecified breast: Secondary | ICD-10-CM | POA: Insufficient documentation

## 2019-06-12 DIAGNOSIS — Z872 Personal history of diseases of the skin and subcutaneous tissue: Secondary | ICD-10-CM

## 2019-06-12 HISTORY — DX: Unspecified lump in unspecified breast: N63.0

## 2019-06-12 MED ORDER — ASPIRIN EC 81 MG PO TBEC: 81.0000 mg | DELAYED_RELEASE_TABLET | Freq: Every day | ORAL | 2 refills | Status: DC

## 2019-06-12 MED ORDER — ONE-A-DAY WOMENS PRENATAL 1 28-0.8-235 MG PO CAPS: 1.0000 | ORAL_CAPSULE | Freq: Every day | ORAL | 0 refills | Status: DC

## 2019-06-12 NOTE — Progress Notes (Signed)
CSW A. Kristofer Schaffert completed initiatal [facility] visit with patient Leslie Yates due to [facility] score 7 and GAD& score 5. Patient reports stressful relationship with father of baby is a direct result of her stress. Leslie Yates reports little interest in doing things is because she tired and contributes her pregnancy to decrease energy. CSW A.Linton Rump provided coping strategies to assist patient reduce symptoms of anxiety. Leslie Yates reports she lives with father of baby and the relationship is troublesome which results in constant worrying and inability to relax. CSW A. Linton Rump provided patient with community resources for income based housing. Wic appt is scheduled and patient will follow up.

## 2019-06-12 NOTE — Progress Notes (Signed)
Subjective:   Leslie Yates is a 28 y.o. G1P0000 at [redacted]w[redacted]d by 11.5 week [place] on June 09, 2019 and is being seen today for her first obstetrical visit.  She has no current obstetrical history, but her medical history is significant for obesity. Patient does intend to breast feed. Pregnancy history fully reviewed.Patient reports some vaginal bumps that she thinks is folliculitis.  Patient states she has been experiencing these painful bumps since before becoming sexually active and has tried many interventions for management with little to no improvement in symptoms.  Patient also reports noticing a lump in her left breast about 3-4 weeks ago, but has not been able to feel it since.    Patient reports nausea and vomiting.  She reports that the nausea and vomiting is intensified with taking her prenatal vitamins.  Patient further reports that she has a script for Phenergan, but has not been able to obtain it because she has not yet gotten paid.    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  She endorses that this is active work and states she is on her feet for 8-12 hours daily.  She endorses a good support system and denies DV/A.   HISTORY: OB History  Gravida Para Term Preterm AB Living  1 0 0 0 0 0  SAB TAB Ectopic Multiple Live Births  0 0 0 0 0    # Outcome Date GA Lbr Len/2nd Weight Sex Delivery Anes PTL Lv  1 Current             Last pap smear date is unknown, but patient reports a history of Abnormal paps at 28 years old.  Past Medical History:  Diagnosis Date  . Asthma   . Asthma   . Obesity   . Ovarian cyst   . Sickle cell trait [facility])    Past Surgical History:  Procedure Laterality Date  . INCISION AND DRAINAGE ABSCESS     Family History  Problem Relation Age of Onset  . Diabetes Mother   . Hypertension Mother   . Diabetes Father   . Hypertension Father    Social History   Tobacco Use  . Smoking status: Current Every Day Smoker     Packs/day: 1.00    Years: 11.00    Pack years: 11.00    Types: Cigarettes  . Smokeless tobacco: Never Used  Substance Use Topics  . Alcohol use: Not Currently    Comment: occ  . Drug use: Not Currently    Types: Benzodiazepines    Comment: Last used: Last Thursday   Allergies  Allergen Reactions  . Penicillins Rash    Childhood rash Has patient had a PCN reaction causing immediate rash, facial/tongue/throat swelling, SOB or lightheadedness with hypotension: no Has patient had a PCN reaction causing severe rash involving mucus membranes or skin necrosis:no Has patient had a PCN reaction that required hospitalization no Has patient had a PCN reaction occurring within the last 10 years: no If all of the above answers are "NO", then may proceed with Cephalosporin use.    Current Outpatient Medications on File Prior to Visit  Medication Sig Dispense Refill  . acetaminophen (TYLENOL) 325 MG tablet Take 650 mg by mouth every 6 (six) hours as needed for mild pain or headache.    . ondansetron (ZOFRAN ODT) 4 MG disintegrating tablet Take 1 tablet (4 mg total) by mouth every 8 (eight) hours as needed for nausea or  vomiting. 15 tablet 0  . ondansetron (ZOFRAN) 4 MG tablet Take 1 tablet (4 mg total) by mouth every 8 (eight) hours as needed for nausea or vomiting. 12 tablet 0  . potassium chloride (K-DUR) 10 MEQ tablet Take 4 tablets (40 mEq total) by mouth daily for 5 days. 20 tablet 0  . Prenatal Vit-Fe Fumarate-FA (PRENATAL COMPLETE) 14-0.4 MG TABS Take 1 tablet by mouth daily. (Patient not taking: Reported on 06/12/2019) 90 tablet 0  . [DISCONTINUED] albuterol (PROVENTIL HFA;VENTOLIN HFA) 108 (90 Base) MCG/ACT inhaler Inhale 2 puffs into the lungs every 6 (six) hours as needed for wheezing or shortness of breath. (Patient not taking: Reported on 10/27/2018) 1 Inhaler 2  . [DISCONTINUED] pantoprazole (PROTONIX) 40 MG tablet Take 1 tablet (40 mg total) by mouth daily. 30 tablet 0   No current  facility-administered medications on file prior to visit.    Review of Systems Pertinent items noted in HPI and remainder of comprehensive ROS otherwise negative.  Exam   Vitals:   06/12/19 0849  BP: (!) 108/58  Pulse: 62  Temp: 97.8 F (36.6 C)  Weight: 260 lb 6.4 oz (118.1 kg)   Fetal Heart Rate (bpm): 158 Physical Exam Exam conducted with a chaperone present.  Constitutional:      Appearance: Normal appearance. She is obese.  HENT:     Head: Atraumatic.  Cardiovascular:     Rate and Rhythm: Normal rate and regular rhythm.     Heart sounds: Normal heart sounds.  Chest:     Breasts:        Right: Mass and tenderness present. No nipple discharge or skin change.        Left: No mass, nipple discharge, skin change or tenderness.     Comments: Lump noted in Right Upper Quadrant, but provider unable to palpate on reexamination, but tenderness elicited at that time.  Genitourinary:    Labia:        Right: No rash.        Left: No rash.      Vagina: Vaginal discharge (Scant amt mucoid type) present. No bleeding.     Cervix: No cervical motion tenderness, friability or cervical bleeding.     Uterus: Enlarged. Not tender.        Comments: CV collected from vaginal vault Cervix with nabothian cyst at 2'oclock Uterus c/w 12-[redacted] wk GA  Musculoskeletal:        General: Normal range of motion.  Lymphadenopathy:     Upper Body:     Right upper body: Axillary adenopathy present.     Left upper body: No axillary adenopathy.  Skin:    General: Skin is warm and dry.     Comments: Multiple Tattoos Noted  Neurological:     Mental Status: She is alert.       Assessment:   Pregnancy: G1P0000 Patient Active Problem List   Diagnosis Date Noted  . Supervision of normal first pregnancy, antepartum 06/01/2019  . Cyclical vomiting, intractable 10/28/2018  . Hypokalemia 10/28/2018  . Tobacco use 10/28/2018  . Cyclical vomiting with nausea 10/27/2018  . Asthma 10/27/2018  .  Complex cyst of right ovary 05/10/2017  . Ovarian cyst, right 11/10/2016  . Nausea with vomiting 11/09/2016     Plan:  1. Supervision of normal first pregnancy, antepartum -Congratulations given and patient welcomed to practice. -Discussed Babyscripts as additional source of PN visits in midst of coronavirus.   -Encouraged to seek out care at office or emergency  room for urgent and/or emergent concerns. -Anticipatory guidance for prenatal visits including labs, ultrasounds, and testing. -Encouraged to complete MyChart Registration for her ability to review results, send requests, and have questions addressed.  -Discussed due date based on December 24, 2019 based on recent US findings. -Will give prenatal vitamins samples today.  -Desires circumcision if a female child.  -Informed that next appt will be virtual and [place] will be ordered and scheduled for after that visit.   - Cervicovaginal ancillary only( [place]) - Obstetric Panel, Including HIV - ob urine culture - Hemoglobinopathy evaluation - Cystic Fibrosis Mutation 97 - SMN1 Copy Number Analysis - HgB A1c - Glucose   2. Obesity during pregnancy, antepartum -Discussed weight and increased risk for gHTN and PreEclampsia -Will start on bASA regime. -Labs collected on 12/7 and WNL  3. History of folliculitis -Informed that findings today not conclusive for folliculitis. -Discussed need for dermatology referral for further evaluation especially during flare up of symptoms. -Patient encouraged to call and have referral placed once medicaid becomes active.   4. Lump of breast -Questionable lump in Upper Outer Quadrant of Right Breast, but unable to palpate on repeat examination. -CBE also notable for fibrocystic changes bilaterally and tenderness with palpation on right breast. -No apparent lumps palpated on breast of complaint. -Discussed that due to size of breast it would be optimal to pursue further evaluation.  -Will send for  diagnostic mammogram/us via [facility].  Initial labs drawn Genetic Screening discussed, First trimester screen, Quad screen and NIPS: declined. Ultrasound discussed; fetal anatomic survey: requested. Problem list reviewed and updated. The nature of [facility] with multiple MDs and other Advanced Practice Providers was explained to patient; also emphasized that residents, students are part of our team. Routine obstetric precautions reviewed. Return in about 4 weeks (around 07/10/2019) for LR-ROB via Virtual Visit.     Cherre Robins, CNM 06/12/2019 9:11 AM

## 2019-06-12 NOTE — Assessment & Plan Note (Signed)
Recommendations Valu.Nieves ] Aspirin 81 mg daily after 12 weeks; discontinue after 36 weeks [ ]  Nutrition consult Valu.Nieves ] Weight gain 11-20 lbs for singleton and 25-35 lbs for twin pregnancy (IOM guidelines) . Higher class of obesity patients recommended to gain closer to lower limit  . Weight loss is associated with adverse outcomes Valu.Nieves ] Screen for DM with A1C or early 2 hr GTT Valu.Nieves ] Baseline and surveillance labs (pulled in from [facility], refresh links as needed)  Lab Results  Component Value Date   PLT 197 06/01/2019   CREATININE 0.66 06/01/2019   AST 11 (L) 06/01/2019   ALT 11 06/01/2019    Antenatal Testing: Not indicated.  [ ]  Growth scans every 4-6 weeks as needed (fundal height likely inadequate in morbidly obese patients)  Postpartum Care: [ ]  Consider prophylactic wound vac/PICO for C/S [ ]  Lovenox for DVT/PE prophylaxis (6 hours after vaginal delivery, 12 hours after C/S).    Lovenox 40 mg [place] q24h (BMI 30.0-39.9 kg/m2)   Lovenox 0.5 mg/kg [place] q12h ((BMI ?40 kg/m2 ); Max 150 mg [place] q12h.   Consider prolonged therapy x 6 weeks PP in very concerning patients (I.e morbid obesity with other co-morbidities that increase risk of DVT/PE) [ ]  Counsel about diet, exercise and weight loss. Referrals PRN.  ICD10 Codes: O99.210   Obesity in pregnancy (BMI 30.0-39.9 kg/m2)  O99.210, E66.01 Maternal Morbid Obesity (BMI ?40 kg/m2 ).**Have to use both codes, this is a [place] code and risk adjusts/more reimbursement**  Obesity is defined as body mass index (BMI) ?30 kg/m2 .  Marland Kitchen Class I (BMI 30.0 to 34.9 kg/m2) . Class II (BMI 35.0 to 39.9 kg/m2) . Class III/Morbid obesity (BMI ?40 kg/m2 ).

## 2019-06-12 NOTE — Patient Instructions (Signed)
First Trimester of Pregnancy The first trimester of pregnancy is from week 1 until the end of week 13 (months 1 through 3). A week after a sperm fertilizes an egg, the egg will implant on the wall of the uterus. This embryo will begin to develop into a baby. Genes from you and your partner will form the baby. The female genes will determine whether the baby will be a boy or a girl. At 6-8 weeks, the eyes and face will be formed, and the heartbeat can be seen on ultrasound. At the end of 12 weeks, all the baby's organs will be formed. Now that you are pregnant, you will want to do everything you can to have a healthy baby. Two of the most important things are to get good prenatal care and to follow your health care provider's instructions. Prenatal care is all the medical care you receive before the baby's birth. This care will help prevent, find, and treat any problems during the pregnancy and childbirth. Body changes during your first trimester Your body goes through many changes during pregnancy. The changes vary from woman to woman.  You may gain or lose a couple of pounds at first.  You may feel sick to your stomach (nauseous) and you may throw up (vomit). If the vomiting is uncontrollable, call your health care provider.  You may tire easily.  You may develop headaches that can be relieved by medicines. All medicines should be approved by your health care provider.  You may urinate more often. Painful urination may mean you have a bladder infection.  You may develop heartburn as a result of your pregnancy.  You may develop constipation because certain hormones are causing the muscles that push stool through your intestines to slow down.  You may develop hemorrhoids or swollen veins (varicose veins).  Your breasts may begin to grow larger and become tender. Your nipples may stick out more, and the tissue that surrounds them (areola) may become darker.  Your gums may bleed and may be  sensitive to brushing and flossing.  Dark spots or blotches (chloasma, mask of pregnancy) may develop on your face. This will likely fade after the baby is born.  Your menstrual periods will stop.  You may have a loss of appetite.  You may develop cravings for certain kinds of food.  You may have changes in your emotions from day to day, such as being excited to be pregnant or being concerned that something may go wrong with the pregnancy and baby.  You may have more vivid and strange dreams.  You may have changes in your hair. These can include thickening of your hair, rapid growth, and changes in texture. Some women also have hair loss during or after pregnancy, or hair that feels dry or thin. Your hair will most likely return to normal after your baby is born. What to expect at prenatal visits During a routine prenatal visit:  You will be weighed to make sure you and the baby are growing normally.  Your blood pressure will be taken.  Your abdomen will be measured to track your baby's growth.  The fetal heartbeat will be listened to between weeks 10 and 14 of your pregnancy.  Test results from any previous visits will be discussed. Your health care provider may ask you:  How you are feeling.  If you are feeling the baby move.  If you have had any abnormal symptoms, such as leaking fluid, bleeding, severe headaches, or abdominal   cramping.  If you are using any tobacco products, including cigarettes, chewing tobacco, and electronic cigarettes.  If you have any questions. Other tests that may be performed during your first trimester include:  Blood tests to find your blood type and to check for the presence of any previous infections. The tests will also be used to check for low iron levels (anemia) and protein on red blood cells (Rh antibodies). Depending on your risk factors, or if you previously had diabetes during pregnancy, you may have tests to check for high blood sugar  that affects pregnant women (gestational diabetes).  Urine tests to check for infections, diabetes, or protein in the urine.  An ultrasound to confirm the proper growth and development of the baby.  Fetal screens for spinal cord problems (spina bifida) and Down syndrome.  HIV (human immunodeficiency virus) testing. Routine prenatal testing includes screening for HIV, unless you choose not to have this test.  You may need other tests to make sure you and the baby are doing well. Follow these instructions at home: Medicines  Follow your health care provider's instructions regarding medicine use. Specific medicines may be either safe or unsafe to take during pregnancy.  Take a prenatal vitamin that contains at least 600 micrograms (mcg) of folic acid.  If you develop constipation, try taking a stool softener if your health care provider approves. Eating and drinking   Eat a balanced diet that includes fresh fruits and vegetables, whole grains, good sources of protein such as meat, eggs, or tofu, and low-fat dairy. Your health care provider will help you determine the amount of weight gain that is right for you.  Avoid raw meat and uncooked cheese. These carry germs that can cause birth defects in the baby.  Eating four or five small meals rather than three large meals a day may help relieve nausea and vomiting. If you start to feel nauseous, eating a few soda crackers can be helpful. Drinking liquids between meals, instead of during meals, also seems to help ease nausea and vomiting.  Limit foods that are high in fat and processed sugars, such as fried and sweet foods.  To prevent constipation: ? Eat foods that are high in fiber, such as fresh fruits and vegetables, whole grains, and beans. ? Drink enough fluid to keep your urine clear or pale yellow. Activity  Exercise only as directed by your health care provider. Most women can continue their usual exercise routine during  pregnancy. Try to exercise for 30 minutes at least 5 days a week. Exercising will help you: ? Control your weight. ? Stay in shape. ? Be prepared for labor and delivery.  Experiencing pain or cramping in the lower abdomen or lower back is a good sign that you should stop exercising. Check with your health care provider before continuing with normal exercises.  Try to avoid standing for long periods of time. Move your legs often if you must stand in one place for a long time.  Avoid heavy lifting.  Wear low-heeled shoes and practice good posture.  You may continue to have sex unless your health care provider tells you not to. Relieving pain and discomfort  Wear a good support bra to relieve breast tenderness.  Take warm sitz baths to soothe any pain or discomfort caused by hemorrhoids. Use hemorrhoid cream if your health care provider approves.  Rest with your legs elevated if you have leg cramps or low back pain.  If you develop varicose veins in   your legs, wear support hose. Elevate your feet for 15 minutes, 3-4 times a day. Limit salt in your diet. Prenatal care  Schedule your prenatal visits by the twelfth week of pregnancy. They are usually scheduled monthly at first, then more often in the last 2 months before delivery.  Write down your questions. Take them to your prenatal visits.  Keep all your prenatal visits as told by your health care provider. This is important. Safety  Wear your seat belt at all times when driving.  Make a list of emergency phone numbers, including numbers for family, friends, the hospital, and police and fire departments. General instructions  Ask your health care provider for a referral to a local prenatal education class. Begin classes no later than the beginning of month 6 of your pregnancy.  Ask for help if you have counseling or nutritional needs during pregnancy. Your health care provider can offer advice or refer you to specialists for help  with various needs.  Do not use hot tubs, steam rooms, or saunas.  Do not douche or use tampons or scented sanitary pads.  Do not cross your legs for long periods of time.  Avoid cat litter boxes and soil used by cats. These carry germs that can cause birth defects in the baby and possibly loss of the fetus by miscarriage or stillbirth.  Avoid all smoking, herbs, alcohol, and medicines not prescribed by your health care provider. Chemicals in these products affect the formation and growth of the baby.  Do not use any products that contain nicotine or tobacco, such as cigarettes and e-cigarettes. If you need help quitting, ask your health care provider. You may receive counseling support and other resources to help you quit.  Schedule a dentist appointment. At home, brush your teeth with a soft toothbrush and be gentle when you floss. Contact a health care provider if:  You have dizziness.  You have mild pelvic cramps, pelvic pressure, or nagging pain in the abdominal area.  You have persistent nausea, vomiting, or diarrhea.  You have a bad smelling vaginal discharge.  You have pain when you urinate.  You notice increased swelling in your face, hands, legs, or ankles.  You are exposed to fifth disease or chickenpox.  You are exposed to German measles (rubella) and have never had it. Get help right away if:  You have a fever.  You are leaking fluid from your vagina.  You have spotting or bleeding from your vagina.  You have severe abdominal cramping or pain.  You have rapid weight gain or loss.  You vomit blood or material that looks like coffee grounds.  You develop a severe headache.  You have shortness of breath.  You have any kind of trauma, such as from a fall or a car accident. Summary  The first trimester of pregnancy is from week 1 until the end of week 13 (months 1 through 3).  Your body goes through many changes during pregnancy. The changes vary from  woman to woman.  You will have routine prenatal visits. During those visits, your health care provider will examine you, discuss any test results you may have, and talk with you about how you are feeling. This information is not intended to replace advice given to you by your health care provider. Make sure you discuss any questions you have with your health care provider. Document Released: 06/05/2001 Document Revised: 05/24/2017 Document Reviewed: 05/23/2016 [employer]vier Patient Education  2020 [employer]  

## 2019-06-15 ENCOUNTER — Telehealth (HOSPITAL_COMMUNITY): Payer: Self-pay

## 2019-06-15 LAB — CULTURE, OB URINE

## 2019-06-15 LAB — CERVICOVAGINAL ANCILLARY ONLY
Bacterial Vaginitis (gardnerella): POSITIVE — AB
Candida Glabrata: NEGATIVE
Candida Vaginitis: NEGATIVE
Chlamydia: NEGATIVE
Comment: NEGATIVE
Comment: NEGATIVE
Comment: NEGATIVE
Comment: NEGATIVE
Comment: NEGATIVE
Comment: NORMAL
Neisseria Gonorrhea: NEGATIVE
Trichomonas: NEGATIVE

## 2019-06-15 LAB — URINE CULTURE, OB REFLEX

## 2019-06-15 NOTE — Telephone Encounter (Signed)
Telephoned patient at home number. Left message to call and schedule with [facility]. °

## 2019-06-16 ENCOUNTER — Telehealth: Payer: Self-pay | Admitting: *Deleted

## 2019-06-16 DIAGNOSIS — Z3400 Encounter for supervision of normal first pregnancy, unspecified trimester: Secondary | ICD-10-CM

## 2019-06-16 MED ORDER — ONE-A-DAY WOMENS PRENATAL 1 28-0.8-235 MG PO CAPS: 1.0000 | ORAL_CAPSULE | Freq: Every day | ORAL | 11 refills | Status: DC

## 2019-06-16 NOTE — Telephone Encounter (Signed)
Patient requested PNV sent to [facility] pharmacy.  Derl Barrow, RN

## 2019-06-17 ENCOUNTER — Encounter (HOSPITAL_COMMUNITY): Payer: Self-pay

## 2019-06-17 DIAGNOSIS — D573 Sickle-cell trait: Secondary | ICD-10-CM | POA: Insufficient documentation

## 2019-06-22 LAB — OBSTETRIC PANEL, INCLUDING HIV
Antibody Screen: NEGATIVE
Basophils Absolute: 0 10*3/uL (ref 0.0–0.2)
Basos: 0 %
EOS (ABSOLUTE): 0.1 10*3/uL (ref 0.0–0.4)
Eos: 1 %
HIV Screen 4th Generation wRfx: NONREACTIVE
Hematocrit: 35 % (ref 34.0–46.6)
Hemoglobin: 11.9 g/dL (ref 11.1–15.9)
Hepatitis B Surface Ag: NEGATIVE
Immature Grans (Abs): 0 10*3/uL (ref 0.0–0.1)
Immature Granulocytes: 0 %
Lymphocytes Absolute: 1.4 10*3/uL (ref 0.7–3.1)
Lymphs: 14 %
MCH: 30.1 pg (ref 26.6–33.0)
MCHC: 34 g/dL (ref 31.5–35.7)
MCV: 89 fL (ref 79–97)
Monocytes Absolute: 0.3 10*3/uL (ref 0.1–0.9)
Monocytes: 3 %
Neutrophils Absolute: 7.8 10*3/uL — ABNORMAL HIGH (ref 1.4–7.0)
Neutrophils: 82 %
Platelets: 235 10*3/uL (ref 150–450)
RBC: 3.95 x10E6/uL (ref 3.77–5.28)
RDW: 12.7 % (ref 11.7–15.4)
RPR Ser Ql: NONREACTIVE
Rh Factor: POSITIVE
Rubella Antibodies, IGG: 1.74 index (ref >0.99)
WBC: 9.6 10*3/uL (ref 3.4–10.8)

## 2019-06-22 LAB — CYSTIC FIBROSIS MUTATION 97: Interpretation: NOT DETECTED

## 2019-06-22 LAB — HEMOGLOBIN A1C
Est. average glucose Bld gHb Est-mCnc: 97 mg/dL
Hgb A1c MFr Bld: 5 % (ref 4.8–5.6)

## 2019-06-23 ENCOUNTER — Encounter: Payer: Self-pay | Admitting: Nurse Practitioner

## 2019-06-24 ENCOUNTER — Other Ambulatory Visit (HOSPITAL_COMMUNITY): Payer: Self-pay | Admitting: *Deleted

## 2019-06-24 DIAGNOSIS — N6310 Unspecified lump in the right breast, unspecified quadrant: Secondary | ICD-10-CM

## 2019-06-26 NOTE — L&D Delivery Note (Addendum)
Patient is 29 y.o. G1P0000 [redacted]w[redacted]d admitted IOL for HELLP.   Delivery Note At 9:38 PM a viable female was delivered via Vaginal, Spontaneous (Presentation: Right Occiput Anterior).  APGAR: 5, 8; weight  pending.   Placenta status: Spontaneous;Pathology, Intact.  Cord: 3 vessels with the following complications: None.  Clamped after 1 minute and cut by support person.  Cord pH: 7.293.  NICU present for delivery.  Anesthesia: Epidural Episiotomy:  none Lacerations: 2nd degree;Perineal, bilateral shallow labial lacerations Suture Repair: 3.0 monocryl Est. Blood Loss (mL):  330 mL  Mom to postpartum.  Baby to Couplet care / Skin to Skin.  Solmon Ice Meccariello 11/21/2019, 10:10 PM  Attestation:  I confirm that I have verified the information documented in the resident's note and that I have also personally reperformed the physical exam and all medical decision making activities.   I was gloved and present for entire delivery SVD without incident No difficulty with shoulders Lacerations as listed above Repair of same supervised by me Aviva Signs, CNM

## 2019-06-29 ENCOUNTER — Telehealth: Payer: Self-pay | Admitting: *Deleted

## 2019-06-29 DIAGNOSIS — Z3400 Encounter for supervision of normal first pregnancy, unspecified trimester: Secondary | ICD-10-CM

## 2019-06-29 DIAGNOSIS — O0990 Supervision of high risk pregnancy, unspecified, unspecified trimester: Secondary | ICD-10-CM | POA: Diagnosis not present

## 2019-06-29 MED ORDER — GOJJI WEIGHT SCALE MISC: 1.0000 | Freq: Every day | 0 refills | Status: DC | PRN

## 2019-06-29 MED ORDER — BLOOD PRESSURE MONITOR AUTOMAT DEVI: 1.0000 | Freq: Every day | 0 refills | Status: DC

## 2019-06-29 NOTE — Telephone Encounter (Signed)
Patient called stating she finally received her medicaid card. She is requesting Rx for BP cuff and scale be sent to pharmacy.  Clovis Pu, RN

## 2019-07-01 ENCOUNTER — Other Ambulatory Visit: Payer: Self-pay

## 2019-07-01 DIAGNOSIS — N63 Unspecified lump in unspecified breast: Secondary | ICD-10-CM

## 2019-07-01 DIAGNOSIS — Z3400 Encounter for supervision of normal first pregnancy, unspecified trimester: Secondary | ICD-10-CM

## 2019-07-01 DIAGNOSIS — Z229 Carrier of infectious disease, unspecified: Secondary | ICD-10-CM

## 2019-07-01 LAB — SMN1 COPY NUMBER ANALYSIS (SMA CARRIER SCREENING)

## 2019-07-01 LAB — HEMOGLOBINOPATHY EVALUATION
HGB C: 0 %
HGB S: 37 % — ABNORMAL HIGH
HGB VARIANT: 0 %
Hemoglobin A2 Quantitation: 3.8 % — ABNORMAL HIGH (ref 1.8–3.2)
Hemoglobin F Quantitation: 3.4 % — ABNORMAL HIGH (ref 0.0–2.0)
Hgb A: 55.8 % — ABNORMAL LOW (ref 96.4–98.8)

## 2019-07-01 LAB — GLUCOSE, RANDOM: Glucose: 91 mg/dL (ref 65–99)

## 2019-07-02 ENCOUNTER — Other Ambulatory Visit: Payer: Self-pay | Admitting: *Deleted

## 2019-07-02 DIAGNOSIS — Z3400 Encounter for supervision of normal first pregnancy, unspecified trimester: Secondary | ICD-10-CM

## 2019-07-02 MED ORDER — METRONIDAZOLE 500 MG PO TABS: 500.0000 mg | ORAL_TABLET | Freq: Two times a day (BID) | ORAL | 0 refills | Status: AC

## 2019-07-02 MED ORDER — ONE-A-DAY WOMENS PRENATAL 1 28-0.8-235 MG PO CAPS: 1.0000 | ORAL_CAPSULE | Freq: Every day | ORAL | 11 refills | Status: DC

## 2019-07-02 NOTE — Progress Notes (Signed)
Patient called stating she received her medicaid and requested to resend Rx for metronidazole and prenatal vitamin to pharmacy. Patient did not pickup the medications due to cost. Both medications sent to pharmacy.  Clovis Pu, RN

## 2019-07-03 ENCOUNTER — Telehealth: Payer: Self-pay | Admitting: General Practice

## 2019-07-03 NOTE — Telephone Encounter (Signed)
Left message on VM in regards to Genetic Counseling appointment scheduled on 07/07/2019 at 10:30am with MFM.

## 2019-07-07 ENCOUNTER — Ambulatory Visit (HOSPITAL_COMMUNITY): Payer: Self-pay

## 2019-07-07 ENCOUNTER — Ambulatory Visit: Admission: RE | Admit: 2019-07-07 | Payer: Self-pay | Source: Ambulatory Visit

## 2019-07-07 ENCOUNTER — Ambulatory Visit (HOSPITAL_COMMUNITY): Payer: Self-pay | Admitting: Genetic Counselor

## 2019-07-07 ENCOUNTER — Other Ambulatory Visit: Payer: Self-pay

## 2019-07-07 ENCOUNTER — Ambulatory Visit (HOSPITAL_COMMUNITY): Payer: Medicaid Other | Attending: Obstetrics and Gynecology | Admitting: Genetic Counselor

## 2019-07-07 DIAGNOSIS — D573 Sickle-cell trait: Secondary | ICD-10-CM | POA: Diagnosis not present

## 2019-07-07 DIAGNOSIS — Z148 Genetic carrier of other disease: Secondary | ICD-10-CM | POA: Diagnosis not present

## 2019-07-07 DIAGNOSIS — Z3A15 15 weeks gestation of pregnancy: Secondary | ICD-10-CM | POA: Diagnosis not present

## 2019-07-07 DIAGNOSIS — Z36 Encounter for antenatal screening for chromosomal anomalies: Secondary | ICD-10-CM | POA: Diagnosis not present

## 2019-07-07 DIAGNOSIS — Z315 Encounter for genetic counseling: Secondary | ICD-10-CM | POA: Diagnosis not present

## 2019-07-07 NOTE — Progress Notes (Signed)
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 Leslie Yates came to Leslie appointment alone due to COVID-19 visitor restrictions. However, Leslie partner, Birder Yates, participated in the session over the phone.   Indication for genetic counseling - Carrier for sickle cell disease - Increased risk to be silent (2+0) carrier for spinal muscular atrophy  Prenatal history  Ms. Dunford is a G1P0000, 29 y.o. female. Leslie current pregnancy has completed [redacted]w[redacted]d(Estimated Date of Delivery: 12/24/19).  Ms. FVanluedenied exposure to environmental toxins or chemical agents. She denied the use of alcohol. She reportedly smokes half a pack of cigarettes per day and has significantly decreased Leslie usage during this pregnancy. She also reported using marijuana occasionally for nausea. She denied significant viral illnesses, fevers, and bleeding during the course of Leslie pregnancy. Leslie medical and surgical histories were noncontributory.  Prenatal exposure to tobacco can increase the risk for placenta previa and placental abruption, preterm birth, low birth weight, oral clefts, stillbirth, and sudden infant death syndrome (SIDS). Prenatal exposure to nicotine is also associated with an increased likelihood of neurological disorder and asthma. The risk of pregnancy complications associated with cigarette exposure tends to increase with the amount of cigarettes a woman smokes. I praised Ms. Basher for Leslie efforts to cut down on smoking and encouraged Leslie to continue reducing the number of cigarettes smoked per day with the goal to eventually quit smoking altogether.  We also discussed that the effects of marijuana on pregnancies are still largely unknown. It is difficult to study marijuana use during pregnancy for many reasons. While  most studies have been reassuring regarding birth defects, others have demonstrated that there may be an increased chance for pregnancy complications, problems with placental function, and temporary postnatal withdrawal symptoms in children exposed to marijuana prenatally. Studies are conflicting regarding the risk for problems such as impaired executive functioning, impulsivity, hyperactivity, aggression, depression, and anxiety in children prenatally exposed to marijuana. For this reason, it is recommended to avoid marijuana use during pregnancy.  Family History  A three generation pedigree was drafted and reviewed. The family history is remarkable for the following:  - Ms. Bergemann reported a personal history of learning difficulties. She is currently in school studying nursing and has done an excellent job advocating for herself in getting the services that she needs, such as tutoring. We discussed that many times, learning difficulties are multifactorial in nature, occurring due to a combination of genetic and environmental factors that are difficult to identify. Learning dfficulties can appear to run in families; thus, there is a chance that the couple's children could also experience learning difficulties of some kind. The couple understands that they should make the pediatrician aware of any concerns they have about their children's development.  - Ms. Lowenstein's father died in his 89[place]f a heart attack. Many forms of heart disease are also multifactorial in nature, occurring due to a combination of genetic, lifestyle, and environmental factors. Known risk factors for having a heart attack at a young age include substance abuse, smoking, hypertension, hypercholesterolemia, lack of physical activity, diabetes, and poor diet. However, some families appear to have a strong family history of heart disease, and certain genetic conditions can be associated with heart attacks at young ages. Given that no other  paternal relatives have a history of heart  disease or heart attacks, Ms. Moor's father's heart attack was likely multifactorial in nature. For this reason, precise recurrence risk for Ms. Hunn and Leslie children cannot be determined.   - Ms. Hoefer has a paternal half sister who was diagnosed with breast cancer in Leslie mid-21s who just recently finished treatment. Ms. Zbikowski is unsure of whether or not Leslie sister had genetic testing performed. Though most cancers are thought to be sporadic or due to environmental factors, some families appear to have a strong predisposition to cancers.  When considering a family history of cancer, we look for common types of cancer in multiple family members occurring at younger than typical ages. We discussed the option of meeting with a cancer genetic counselor to discuss any possible screening or testing options available. If Ms. Shenoy is concerned about the family history of cancer and would like to learn more about the family's chance for an inherited cancer syndrome, Leslie healthcare provider may refer Leslie or Leslie relatives to the [facility] [phone]).   - Ms. Lightsey has a maternal half brother who died at age 44 due to a brain aneurysm. In most cases, brain aneurysms are not hereditary, and there is generally only a single affected individual in a family. When this is the case, it is rare that other relatives have a higher risk of developing an aneurysm than that of the general population.  - Ms. Katen's maternal half brother had a child who was "born sick" and whom the family could not touch for several months. Ms. Ord did not have much further information about this individual; thus, risk assessment was limited.   - Ms. Disch has a maternal half sister who has had 7 miscarriages. She also has two healthy sons. We reviewed that there are many potential causes of recurrent miscarriages, including anatomic, immunologic, endocrine, and genetic factors. However, a cause  is only identified in 50% of individuals who experience recurrent miscarriages. Abnormalities of chromosome number or structure are the most common cause of sporadic early pregnancy loss. A significant proportion of recurrent miscarriages may also be associated with structural or numerical chromosome abnormalities. We discussed that 2-5% of couples who experience multiple miscarriages carry a balanced translocation that can become unbalanced and potentially lethal in their offspring. In addition to chromosomal abnormalities, certain single gene, X-linked, and polygenic/multifactorial disorders are associated with recurrent miscarriage. Ms. Maurin was informed if she were interested, Leslie sister could have an evaluation with a prenatal genetic counselor.  - Mr. Owens Shark has a daughter from a prior relationship who has a brain tumor. This child's mother reportedly has the same type of brain tumor. The couple had limited information about this history of brain tumors; thus, risk assessment is limited.   - Mr. Hawthorne's maternal half brother died at age 31 due to a hypertension-related stroke. His mother also died due to a hypertension-related stroke, and he has a maternal uncle who has also had a stroke. Hypertension is the most prevalent risk factor for stroke. The precise causes of hypertension are complex; a variety of factors, both genetic and environmental, contribute to its development. Given his family history, Mr. Owens Shark may have a higher chance of developing hypertension himself.   The remaining family histories were reviewed and found to be noncontributory for birth defects, intellectual disability, recurrent pregnancy loss, and known genetic conditions. Ms. Commerford had limited details about Leslie family members' medical histories as health-related information is not freely discussed in the family.  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 The father of the pregnancy's ethnicity is [place],  [place], [place], and [place]. Ashkenazi Jewish ancestry and consanguinity were denied. Pedigree will be scanned under Media.  Discussion  Ms. 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   We discussed that SCA is one condition in a group of blood disorders that affect hemoglobin in red blood cells (hemoglobinopathies). Hemoglobin is a protein that transports oxygen from the lungs to organs and tissues throughout the body. Individuals with SCA have an inherited structural abnormality in hemoglobin's beta globin chains due to a single amino acid change in the HBB gene. Instead of producing normal adult hemoglobin (Hgb A), individuals with SCA produce an atypical form of hemoglobin called hemoglobin S (Hgb S). Typically, individuals are expected to have two copies of Hgb A (Hgb AA). Individuals who are carriers of SCA have one copy of Hgb A and one copy of Hgb S (Hgb AS), whereas individuals affected by SCA have two copies of Hgb S (Hgb SS). Carriers of SCA are often said to have sickle cell "trait".  Hgb S alters the configuration of the hemoglobin molecule. As a result, individuals with SCA have red blood cells that can sickle and obstruct blood flow in small blood vessels, causing ischemia of tissues and organs and episodes of vaso-occlusive crisis. The amino acid change in the HBB gene also causes red blood cells to become fragile and break down easily, which results in chronic anemia. Additional complications associated with SCA may include organ damage, frequent infections, acute chest syndrome, ischemic stroke, splenic sequestration, priapism, and pulmonary hypertension. SCA is inherited in an autosomal recessive pattern, where both parents must carry Hgb S trait to  be at risk of having an affected child. If Ms. Sermersheim's partner were also a carrier of SCA, the couple would have a 1 in 4 (25%) chance of having a child with SCA.   Hgb S is just one variant form of hemoglobin caused by a mutations in the HBB gene. It is also possible that Ms. Hensarling's partner could carry another variant form of hemoglobin, such as hemoglobin C. If he did, the couple would have a 1 in 4 (25%) chance of having a child with hemoglobin [place] disease (HbSC disease). Individuals with HbSC disease have red blood cells that contain both hemoglobin S and hemoglobin C. These variant forms of hemoglobin can cause red blood cells to become rigid and sickle, blocking small blood vessels and making it difficult for the red blood cells to deliver oxygen to the body's tissues. This can cause severe pain and organ damage, just as we see in individuals with sickle cell disease. Individuals with HbSC disease are at risk of the same complications as those associated with SCA, such as pain crises, acute chest syndrome, infections, asplenia, and strokes; however, these complications may occur at a lesser frequency and may be milder.  Finally, Ms. Nigh's partner may have a different variant in the HBB gene that could make him a carrier of beta-thalassemia. If he did, the couple would have a 1 in 4 (25%) chance of having a child with sickle beta thalassemia. The severity of sickle beta thalassemia depends on the normal amount of beta globin that is produced. If an individual produces no beta globin (sickle  beta zero thalassemia), they will experience symptoms similar to SCA. If an individual produces a reduced amount of beta globin (sickle beta plus thalassemia), they may experience symptoms that are similar to a milder form of SCA.   Ms. Tess also had carrier screening for spinal muscular atrophy (SMA) performed. She was found to have 2 copies of the SMN1 gene on SMA carrier screening; however, she also has the  c.*3+80T>G polymorphism of SMN1 in intron 7 (also known as [id number]). This puts Leslie at increased risk (1 in 34, or ~3%) to be a silent 2+0 carrier for SMA. SMA is a condition caused by mutations in the SMN1 gene. Typically, individuals have two copies of the SMN1 gene, with one copy present on each chromosome. In 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s, both copies of the SMN1 gene are present on one chromosome, with no copies of SMN1 present on the other chromosome.  SMA is characterized by progressive muscle weakness and atrophy due to degeneration and loss of anterior horn cells (lower motor neurons) in the spinal cord and brain stem. We discussed the different types of SMA (0, I, II, and III), including differences in severity and age of onset. We also reviewed the autosomal recessive inheritance pattern of SMA. If both parents are carriers of SMA, there is a 1 in 4 (25%) chance of having an affected fetus.   In addition to carrier screening for hemoglobinopathies and SMA, Ms. Vader had carrier screening for cystic fibrosis (CF). She was negative for the mutations analyzed in the CFTR gene associated with CF. Based on this negative result and Leslie ethnic background, she has a 1 in 338 residual risk of being a carrier for CF, significantly reducing the couple's risk of having a child with CF.  Given his ethnicity, Ms. Stepanek's partner has a 1 in 11 chance of carrying hemoglobin S trait, a 1 in 38 chance at carrying hemoglobin C trait, and a 1 in 75 chance of carrying beta-thalassemia. He also has a 1 in 66 chance of being a carrier for SMA. If he were found to have 2 copies of SMN1, his risk of being a carrier for SMA is reduced but not eliminated. It is recommended that Mr. Hawthorne undergo carrier screening to refine the risks for the current pregnancy to be affected with sickle cell disease, HbSC disease, sickle beta thalassemia, or SMA. Ms. Knabe and Mr. Owens Shark were interested in pursuing partner carrier screening.  Ms.  Croghan expressed that she was interested in carrier screening for more conditions as well given that she knew little about Leslie family history. She expressed that she is interested in knowing all possible health information for the sake of Leslie child. We discussed that expanded carrier screening (ECS) is a testing option that evaluates carrier status for a wide range of genetic conditions. ECS panels can include >200 autosomal recessive or X-linked genetic conditions. Ms. Jerkins was counseled that if she were found to be a carrier for one or more recessive conditions, carrier screening for those conditions would be recommended for Leslie partner. This could help determine whether the current fetus and future pregnancies with Leslie current partner are at increased risk for other recessive or X-linked genetic conditions.    Although ECS is able to identify an individual's carrier status for many genetic disorders, it also has some limitations. ECS could possibly reveal information about Ms. Liskey's own health that she was previously unaware of. For example, some genes included on ECS panels confer an increased  risk for cancer, dementia, and other health problems for carriers. This can be anxiety-provoking for some individuals. Additionally, some of the conditions included on ECS are rare while others may occur more commonly. Some conditions may be severe and actionable, whereas other conditions may not yet be well-understood or may not have treatment options available. Additionally, ECS does not evaluate for all possible genetic conditions. Finally, ECS is not diagnostic. Even if one or both parents are found to be carriers for a condition, their children would not be guaranteed to be affected. Ms. Mcgriff understood these risks and limitations and indicated that she was interested in pursuing ECS. She requested that I perform a benefits investigation through the testing laboratory before pursuing testing to determine Leslie expected  out-of-pocket cost for ECS.  We reviewed noninvasive prenatal screening (NIPS) as another available screening option during pregnancy. Instead of screening for recessive or X-linked conditions, NIPS screens for chromosomal aneuploidies such as Down syndrome in a pregnancy. Specifically, we discussed that NIPS analyzes cell free DNA originating from the placenta that is found in the maternal blood circulation during pregnancy. This test is not diagnostic for chromosome conditions, but can provide information regarding the presence or absence of extra fetal DNA for chromosomes 13, 18, 21, and the sex chromosomes. Thus, it would not identify or rule out all fetal aneuploidy. The reported detection rate is 91-99% for trisomies 21, 18, 13, and sex chromosome aneuploidies. The false positive rate is reported to be less than 0.1% for any of these conditions. Ms. Schnyder indicated that she is interested in undergoing NIPS.  Ms. Lickteig was also counseled regarding diagnostic testing via amniocentesis. We discussed the technical aspects of the procedure and quoted up to a 1 in 500 (0.2%) risk for spontaneous pregnancy loss or other adverse pregnancy outcomes as a result of amniocentesis. Cultured cells from an amniocentesis sample allow for the visualization of a fetal karyotype, which can detect >99% of chromosomal aberrations. Chromosomal microarray can also be performed to identify smaller deletions or duplications of fetal chromosomal material. Amniocentesis could also be performed to assess whether the baby is affected by SCA, HbSC disease, sickle beta thalassemia, or SMA. After careful consideration, Ms. Flakes declined amniocentesis at this time. She understands that amniocentesis is available at any point after 16 weeks of pregnancy and that she may opt to undergo the procedure at a later date should she change Leslie mind. She indicated that she may consider undergoing amniocentesis if NIPS were to come back high-risk for  a chromosomal aneuploidy or if Leslie partner's carrier screening was positive.  Lastly, screening for open neural tube defects (ONTDs) via MS-AFP in the second trimester in addition to level II ultrasound examination is recommended. Level II ultrasound and MS-AFP are able to detect ONTDs with 90-95% sensitivity. However, normal results from any of the above options do not guarantee a normal baby, as 3-5% of newborns have some type of birth defect, many of which are not prenatally diagnosable.  Ms. Weedman had Leslie blood drawn for MaterniT21 NIPS today. Results will take 5-7 days to be returned. I will call Ms. Benavidez when results become available.  I will also perform a benefits investigation for ECS for Ms. Train. If ECS is not possible, Ms. Grider indicated that she was still interested in pursuing carrier screening for HBB-related hemoglobinopathies and SMA for Leslie partner, Birder Yates. Mr. Owens Shark indicated that he does not currently have health insurance; however, he qualifies for free testing through [facility]'s Patient  Assistance Program. I gave Ms. Geisel a saliva kit and a partially completed Patient Garment/textile technologist form for Mr. Hawthorne. We made a plan for Leslie to have him sign the form, then send a photo of the signed form back to me so that I can provide it to the testing laboratory. If Ms. Mcshea does end up undergoing ECS, we will wait until these results are back to proceed with Mr. Hawthorne's testing in case Ms. Smits is identified as a carrier for additional conditions. If she does not undergo ECS, Mr. Owens Shark may submit his saliva sample for HBB-related hemoglobinopathies and SMA at any time. Testing results will take 2-3 weeks to become available from the time the laboratory receives Mr. Hawthorne's sample. I will call the couple when results become available.  Ms. Barbaro also expressed concern over a lump that is on Leslie breast. Leslie healthcare provider had referred Leslie to a  specialist to get the lump evaluated, but that specialist does not accept pregnancy Medicaid. I encouraged Ms. Morning to contact Leslie OBGYN provider to see if they have any recommendations on where she should go for an evaluation that will accept Leslie new insurance.   I counseled Ms. Milson regarding the above risks and available options. The approximate face-to-face time with the genetic counselor was 60 minutes.  In summary:  Discussed carrier screening results and options for follow-up testing  Carrier for sickle cell disease  Increased risk (1 in 34) to be silent carrier for SMA  Desires partner carrier screening. Partner qualifies for free testing through [facility]'s Patient Assistance Program. Patient will send me a photo of the signed application form once Leslie partner has completed it. Also provided Leslie with a saliva kit for Leslie partner. We will follow results  Reviewed aneuploidy screening options  Had blood drawn for MaterniT21 NIPS today. We will follow results  Offered additional testing and screening  Declined diagnostic testing  Potentially interested in pursuing expanded carrier screening. I will perform benefits investigation to determine out of pocket cost, then contact patient to facilitate testing from there if still desired  Recommend MS-AFP screening  Reviewed family history concerns   Buelah Manis, MS Genetic Counselor

## 2019-07-10 ENCOUNTER — Ambulatory Visit (INDEPENDENT_AMBULATORY_CARE_PROVIDER_SITE_OTHER): Payer: Medicaid Other | Admitting: Advanced Practice Midwife

## 2019-07-10 ENCOUNTER — Other Ambulatory Visit: Payer: Self-pay

## 2019-07-10 ENCOUNTER — Telehealth (HOSPITAL_COMMUNITY): Payer: Self-pay | Admitting: Genetic Counselor

## 2019-07-10 ENCOUNTER — Encounter: Payer: Self-pay | Admitting: Advanced Practice Midwife

## 2019-07-10 DIAGNOSIS — Z3400 Encounter for supervision of normal first pregnancy, unspecified trimester: Secondary | ICD-10-CM | POA: Diagnosis not present

## 2019-07-10 DIAGNOSIS — Z3A16 16 weeks gestation of pregnancy: Secondary | ICD-10-CM

## 2019-07-10 DIAGNOSIS — Z3402 Encounter for supervision of normal first pregnancy, second trimester: Secondary | ICD-10-CM

## 2019-07-10 DIAGNOSIS — Z3481 Encounter for supervision of other normal pregnancy, first trimester: Secondary | ICD-10-CM | POA: Diagnosis not present

## 2019-07-10 NOTE — Telephone Encounter (Signed)
Attempted to call Ms. Leslie Yates to discuss her completed benefits investigation (BI) for expanded carrier screening (ECS). Per a billing representative from the ECS laboratory, Ms. Leslie Yates's out of pocket expense for ECS is expected to be $0. I attempted to call the number listed as Ms. Leslie Yates's cellphone first, but the call went straight to voicemail and I could not leave a message as the mailbox was full. I left a voicemail for Ms. Leslie Yates on the number listed as her home phone number re: updates about testing we discussed. I requested a call back to my direct line to discuss this in more detail, as no identifiers were provided in voicemail message.   Gershon Crane, MS Genetic Counselor

## 2019-07-10 NOTE — Progress Notes (Signed)
   PRENATAL VISIT NOTE  Subjective:  Leslie Yates is a 29 y.o. G1P0000 at [redacted]w[redacted]d being seen today for ongoing prenatal care.  She is currently monitored for the following issues for this low-risk pregnancy and has Nausea with vomiting; Ovarian cyst, right; Complex cyst of right ovary; Cyclical vomiting with nausea; Asthma; Cyclical vomiting, intractable; Hypokalemia; Tobacco use; Supervision of normal first pregnancy, antepartum; Obesity in pregnancy, antepartum; Lump of breast; Sickle cell trait (HCC); and Carrier of disease on their problem list.  Patient reports no complaints.  Contractions: Not present.  .  Movement: Absent. Denies leaking of fluid.   The following portions of the patient's history were reviewed and updated as appropriate: allergies, current medications, past family history, past medical history, past social history, past surgical history and problem list.   Objective:   Vitals:   07/10/19 1111  BP: 122/75  Pulse: 67  Weight: 264 lb 4.8 oz (119.9 kg)    Fetal Status: Fetal Heart Rate (bpm): 153   Movement: Absent     General:  Alert, oriented and cooperative. Patient is in no acute distress.  Skin: Skin is warm and dry. No rash noted.   Cardiovascular: Normal heart rate noted  Respiratory: Normal respiratory effort, no problems with respiration noted  Abdomen: Soft, gravid, appropriate for gestational age.  Pain/Pressure: Absent     Pelvic: Cervical exam deferred        Extremities: Normal range of motion.  Edema: None  Mental Status: Normal mood and affect. Normal behavior. Normal judgment and thought content.   Assessment and Plan:  Pregnancy: G1P0000 at [redacted]w[redacted]d 1. Supervision of normal first pregnancy, antepartum - [place] MFM OB COMP + 14 WK; Future - Genetic Screening - AFP, Serum, Open Spina Bifida - Ambulatory referral to Integrated Behavioral Health  Preterm labor symptoms and general obstetric precautions including but not limited to vaginal bleeding,  contractions, leaking of fluid and fetal movement were reviewed in detail with the patient. Please refer to After Visit Summary for other counseling recommendations.   Return in about 4 weeks (around 08/07/2019) for virtual visit .  No future appointments.  Thressa Sheller DNP, CNM  07/10/19  11:46 AM

## 2019-07-12 LAB — MATERNIT21 PLUS CORE+SCA
Fetal Fraction: 5
Monosomy X (Turner Syndrome): NOT DETECTED
Result (T21): NEGATIVE
Trisomy 13 (Patau syndrome): NEGATIVE
Trisomy 18 (Edwards syndrome): NEGATIVE
Trisomy 21 (Down syndrome): NEGATIVE
XXX (Triple X Syndrome): NOT DETECTED
XXY (Klinefelter Syndrome): NOT DETECTED
XYY (Jacobs Syndrome): NOT DETECTED

## 2019-07-12 LAB — AFP, SERUM, OPEN SPINA BIFIDA
AFP MoM: 0.91
AFP Value: 24.2 ng/mL
Gest. Age on Collection Date: 16.1 weeks
Maternal Age At EDD: 28.8 yr
OSBR Risk 1 IN: 10000
Test Results:: NEGATIVE
Weight: 263 [lb_av]

## 2019-07-14 ENCOUNTER — Telehealth (HOSPITAL_COMMUNITY): Payer: Self-pay | Admitting: Genetic Counselor

## 2019-07-14 NOTE — Telephone Encounter (Signed)
LVM for Ms. Bastien re: good news about screening results. Requested a call back to my direct line to discuss these in more detail, as no identifiers were provided in voicemail message. Will also discuss results from benefits investigation for expanded carrier screening when Ms. Eunice contacts me.   Gershon Crane, MS Genetic Counselor

## 2019-07-14 NOTE — Telephone Encounter (Signed)
Received call back from Ms. Grippi to discuss her negative noninvasive prenatal screening (NIPS) results. Specifically, Ms. Gintz  had MaterniT21 testing through [facility] [employer] [facility]. These negative results demonstrated an expected representation of chromosome 82, 86, 65, and sex chromosome material, greatly reducing the likelihood of trisomies 83, 29, or 53 and sex chromosome aneuploidies for the pregnancy. Ms. Gitto requested to know about the expected fetal sex, which was female.  NIPS analyzes placental (fetal) DNA in maternal circulation. NIPS is considered to be highly specific and sensitive, but is not considered to be a diagnostic test. We reviewed that this testing identifies 91-99% of pregnancies with trisomies 32, 17, and 28, as well as sex chromosome abnormalities, but does not test for all genetic conditions. Ms. Lister was reminded that diagnostic testing via amniocentesis is available should she be interested in confirming this result.   We also reviewed that per a benefits investigation performed by [employer], Ms. Milbourne's expected out-of-pocket cost for expanded carrier screening (ECS) is $0. We again reviewed the benefits and limitations of ECS. Ms. Penza indicated that she is still interested in undergoing ECS given her limited knowledge of her family history. I placed an order for [facility]'s Comprehensive Carrier Screen, which assesses carrier status for 289 genes. A saliva kit for ECS will be mailed to Ms. Brazell's house. We discussed that Ms. Salvas's carrier status for sickle cell disease and her increased risk to be a silent carrier for spinal muscular atrophy (SMA) will likely be confirmed by this testing, but that it will also assess for many more conditions for which Ms. Grime has not yet been screened. We will hold off on performing partner carrier screening for Ms. Detamore's partner, [name], until results from ECS are returned. If Ms. Klasen is found to be a carrier for another condition in addition to  sickle cell disease and possibly SMA, partner carrier screening would be recommended for all conditions that Ms. Blessinger screens positive for. Results from ECS will take 2-3 weeks to be returned from the time that the laboratory receives Ms. Locker's sample. I will call her to discuss her results and facilitate a new order for partner carrier screening once her ECS results become available. She confirmed that she had no further questions at this time.  Buelah Manis, MS Genetic Counselor

## 2019-07-16 NOTE — BH Specialist Note (Deleted)
Integrated Behavioral Health via Telemedicine Video Visit  07/16/2019 KANYAH MATSUSHIMA [id number]  Number of [place] visits: 1 Session Start time: 2:45***  Session End time: 3:45*** Total time: {IBH Total WGYK:59935701}  Referring Provider: Marcille Buffy, CNM Type of Visit: Video Patient/Family location: Home [facility] Provider location: [facility] All persons participating in visit: Patient Leslie Yates and [place]    Confirmed patient's address: Yes  Confirmed patient's phone number: Yes  Any changes to demographics: No   Confirmed patient's insurance: Yes  Any changes to patient's insurance: No   Discussed confidentiality: Yes ***  I connected with [name] by a video enabled telemedicine application and verified that I am speaking with the correct person using two identifiers.     I discussed the limitations of evaluation and management by telemedicine and the availability of in person appointments.  I discussed that the purpose of this visit is to provide behavioral health care while limiting exposure to the novel coronavirus.   Discussed there is a possibility of technology failure and discussed alternative modes of communication if that failure occurs.  I discussed that engaging in this video visit, they consent to the provision of behavioral healthcare and the services will be billed under their insurance.  Patient and/or legal guardian expressed understanding and consented to video visit: Yes   PRESENTING CONCERNS: Patient and/or family reports the following symptoms/concerns: *** Duration of problem: ***; Severity of problem: {Mild/Moderate/Severe:20260}  STRENGTHS (Protective Factors/Coping Skills): ***  GOALS ADDRESSED: Patient will: 1.  Reduce symptoms of: {IBH Symptoms:21014056}  2.  Increase knowledge and/or ability of: {IBH Patient Tools:21014057}  3.  Demonstrate ability to: {IBH Goals:21014053}  INTERVENTIONS: Interventions  utilized:  {IBH Interventions:21014054} Standardized Assessments completed: {IBH Screening Tools:21014051}  ASSESSMENT: Patient currently experiencing ***.   Patient may benefit from psychoeducation and brief therapeutic interventions regarding coping with symptoms of *** .  PLAN: 1. Follow up with behavioral health clinician on : *** 2. Behavioral recommendations:  -*** -*** 3. Referral(s): {IBH Referrals:21014055}  I discussed the assessment and treatment plan with the patient and/or parent/guardian. They were provided an opportunity to ask questions and all were answered. They agreed with the plan and demonstrated an understanding of the instructions.   They were advised to call back or seek an in-person evaluation if the symptoms worsen or if the condition fails to improve as anticipated.  Caroleen Hamman Nathanie Ottley  Depression screen [facility] 2/9 06/12/2019 08/19/2018 06/13/2017 05/01/2017 01/28/2017  Decreased Interest 2 3 0 0 2  Down, Depressed, Hopeless 0 - 1 0 0  PHQ - 2 Score 2 3 1  0 2  Altered sleeping 1 3 3  0 3  Tired, decreased energy 2 3 3 2 1   Change in appetite 2 0 3 3 0  Feeling bad or failure about yourself  0 3 0 0 0  Trouble concentrating 0 3 1 0 0  Moving slowly or fidgety/restless 0 0 2 0 0  Suicidal thoughts 0 0 0 0 0  PHQ-9 Score 7 15 13 5 6   Difficult doing work/chores Not difficult at all - - - -   GAD 7 : Generalized Anxiety Score 06/12/2019 08/19/2018 06/13/2017 05/01/2017  Nervous, Anxious, on Edge 0 0 0 0  Control/stop worrying 1 0 1 0  Worry too much - different things 1 3 1  0  Trouble relaxing 1 3 3 1   Restless 0 1 1 0  Easily annoyed or irritable 2 3 3 1   Afraid -  awful might happen 0 0 0 0  Total GAD 7 Score 5 10 9 2   Anxiety Difficulty Not difficult at all - - -

## 2019-07-20 ENCOUNTER — Institutional Professional Consult (permissible substitution): Payer: Medicaid Other

## 2019-07-27 ENCOUNTER — Encounter: Payer: Self-pay | Admitting: General Practice

## 2019-07-27 NOTE — BH Specialist Note (Signed)
Pt did not arrive to video visit and did not answer the phone ; Left HIPPA-compliant message to call back Asher Muir from [facility] at [phone].  ; left MyChart message for patient.    Integrated Behavioral Health via Telemedicine Video Visit  07/27/2019 NARALY FRITCHER [id number]  Rae Lips

## 2019-07-28 ENCOUNTER — Ambulatory Visit: Payer: Medicaid Other | Admitting: Clinical

## 2019-07-28 DIAGNOSIS — Z5329 Procedure and treatment not carried out because of patient's decision for other reasons: Secondary | ICD-10-CM

## 2019-07-28 DIAGNOSIS — Z91199 Patient's noncompliance with other medical treatment and regimen due to unspecified reason: Secondary | ICD-10-CM

## 2019-07-30 ENCOUNTER — Other Ambulatory Visit: Payer: Self-pay

## 2019-07-30 ENCOUNTER — Other Ambulatory Visit (HOSPITAL_COMMUNITY): Payer: Self-pay | Admitting: *Deleted

## 2019-07-30 ENCOUNTER — Ambulatory Visit (HOSPITAL_COMMUNITY)
Admission: RE | Admit: 2019-07-30 | Discharge: 2019-07-30 | Disposition: A | Discharge status: Home / Self Care | Payer: Medicaid Other | Source: Ambulatory Visit | Attending: Obstetrics and Gynecology | Admitting: Obstetrics and Gynecology

## 2019-07-30 ENCOUNTER — Other Ambulatory Visit: Payer: Self-pay | Admitting: Advanced Practice Midwife

## 2019-07-30 DIAGNOSIS — O99212 Obesity complicating pregnancy, second trimester: Secondary | ICD-10-CM

## 2019-07-30 DIAGNOSIS — Z362 Encounter for other antenatal screening follow-up: Secondary | ICD-10-CM

## 2019-07-30 DIAGNOSIS — O99332 Smoking (tobacco) complicating pregnancy, second trimester: Secondary | ICD-10-CM

## 2019-07-30 DIAGNOSIS — Z20822 Contact with and (suspected) exposure to covid-19: Secondary | ICD-10-CM

## 2019-07-30 DIAGNOSIS — Z3A19 19 weeks gestation of pregnancy: Secondary | ICD-10-CM

## 2019-07-30 DIAGNOSIS — Z3400 Encounter for supervision of normal first pregnancy, unspecified trimester: Secondary | ICD-10-CM | POA: Insufficient documentation

## 2019-07-31 ENCOUNTER — Encounter: Payer: Self-pay | Admitting: Advanced Practice Midwife

## 2019-07-31 LAB — NOVEL CORONAVIRUS, NAA: SARS-CoV-2, NAA: NOT DETECTED

## 2019-08-05 ENCOUNTER — Telehealth: Payer: Self-pay | Admitting: General Practice

## 2019-08-05 ENCOUNTER — Inpatient Hospital Stay (HOSPITAL_COMMUNITY)
Admission: AD | Admit: 2019-08-05 | Discharge: 2019-08-05 | Disposition: A | Discharge status: Home / Self Care | Payer: Medicaid Other | Attending: Obstetrics and Gynecology | Admitting: Obstetrics and Gynecology

## 2019-08-05 ENCOUNTER — Encounter (HOSPITAL_COMMUNITY): Payer: Self-pay | Admitting: Obstetrics and Gynecology

## 2019-08-05 ENCOUNTER — Other Ambulatory Visit: Payer: Self-pay

## 2019-08-05 DIAGNOSIS — Z3A19 19 weeks gestation of pregnancy: Secondary | ICD-10-CM | POA: Diagnosis not present

## 2019-08-05 DIAGNOSIS — F17210 Nicotine dependence, cigarettes, uncomplicated: Secondary | ICD-10-CM | POA: Insufficient documentation

## 2019-08-05 DIAGNOSIS — Z880 Allergy status to penicillin: Secondary | ICD-10-CM | POA: Insufficient documentation

## 2019-08-05 DIAGNOSIS — D573 Sickle-cell trait: Secondary | ICD-10-CM | POA: Insufficient documentation

## 2019-08-05 DIAGNOSIS — Z79899 Other long term (current) drug therapy: Secondary | ICD-10-CM | POA: Diagnosis not present

## 2019-08-05 DIAGNOSIS — O99332 Smoking (tobacco) complicating pregnancy, second trimester: Secondary | ICD-10-CM | POA: Diagnosis not present

## 2019-08-05 DIAGNOSIS — O99012 Anemia complicating pregnancy, second trimester: Secondary | ICD-10-CM | POA: Insufficient documentation

## 2019-08-05 DIAGNOSIS — Z8249 Family history of ischemic heart disease and other diseases of the circulatory system: Secondary | ICD-10-CM | POA: Insufficient documentation

## 2019-08-05 DIAGNOSIS — O4692 Antepartum hemorrhage, unspecified, second trimester: Secondary | ICD-10-CM | POA: Insufficient documentation

## 2019-08-05 LAB — WET PREP, GENITAL
Clue Cells Wet Prep HPF POC: NONE SEEN
Sperm: NONE SEEN
Trich, Wet Prep: NONE SEEN
Yeast Wet Prep HPF POC: NONE SEEN

## 2019-08-05 LAB — URINALYSIS, ROUTINE W REFLEX MICROSCOPIC
Bilirubin Urine: NEGATIVE
Glucose, UA: NEGATIVE mg/dL
Ketones, ur: NEGATIVE mg/dL
Leukocytes,Ua: NEGATIVE
Nitrite: NEGATIVE
Protein, ur: NEGATIVE mg/dL
Specific Gravity, Urine: 1.015 (ref 1.005–1.030)
pH: 6 (ref 5.0–8.0)

## 2019-08-05 NOTE — Telephone Encounter (Signed)
Pt called with c/o bleeding.  Advised pt to go to MAU for further evaluation.  Pt verbalized understanding.

## 2019-08-05 NOTE — Discharge Instructions (Signed)
Vaginal Bleeding During Pregnancy, Second Trimester ° °A small amount of bleeding (spotting) from the vagina is relatively common during pregnancy. It usually stops on its own. Various things can cause spotting during pregnancy. Sometimes the bleeding is normal and is not a sign of a problem in the pregnancy. However, bleeding can also be a sign of something serious. Be sure to tell your health care provider about any vaginal bleeding right away. °Some possible causes of vaginal bleeding during the second trimester include: °· Infection, inflammation, or growths (polyps) on the cervix. °· A condition in which the placenta partially or completely covers the opening of the cervix inside the uterus (placenta previa). °· The placenta separating from the uterus (placenta abruption). °· Early (preterm) labor. °· The cervix opening and thinning before pregnancy is at term and before labor starts (cervical insufficiency). °· A mass of tissue developing in the uterus due to an egg being fertilized incorrectly (molar pregnancy). °Follow these instructions at home: °Activity °· Follow instructions from your health care provider about limiting your activity. Ask what activities are safe for you. °· If needed, make plans for someone to help with your regular activities. °· Do not exercise or do activities that take a lot of effort unless your health care provider approves. °· Do not lift anything that is heavier than 10 lb (4.5 kg), or the limit that your health care provider tells you, until he or she says that it is safe. °· Do not have sex or orgasms until your health care provider says that this is safe. °Medicines °· Take over-the-counter and prescription medicines only as told by your health care provider. °· Do not take aspirin because it can cause bleeding. °General instructions °· Pay attention to any changes in your symptoms. °· Write down how many pads you use each day, how often you change pads, and how soaked  (saturated) they are. °· Do not use tampons or douche. °· If you pass any tissue from your vagina, save the tissue so you can show it to your health care provider. °· Keep all follow-up visits as told by your health care provider. This is important. °Contact a health care provider if: °· You have vaginal bleeding during any time of your pregnancy. °· You have cramps or labor pains. °· You have a fever that does not get better when you take medicines. °Get help right away if: °· You have severe cramps in your back or abdomen. °· You have contractions. °· You have chills. °· You pass large clots or a large amount of tissue from your vagina. °· Your bleeding increases. °· You feel light-headed or weak, or you faint. °· You are leaking fluid or have a gush of fluid from your vagina. °Summary °· Various things can cause bleeding or spotting in pregnancy. °· Be sure to tell your health care provider about any vaginal bleeding right away. °· Follow instructions from your health care provider about limiting your activity. Ask what activities are safe for you. °This information is not intended to replace advice given to you by your health care provider. Make sure you discuss any questions you have with your health care provider. °Document Revised: 09/30/2018 Document Reviewed: 09/13/2016 °Elsevier Patient Education © 2020 Elsevier Inc. ° °

## 2019-08-05 NOTE — MAU Note (Signed)
Pt reporting small amounts of blood in the toilet and also when she wiped. She also has some white discharge with no odor. Denies pain. Last intercourse was on Sunday.

## 2019-08-05 NOTE — MAU Provider Note (Signed)
Chief Complaint: Vaginal Bleeding   First Provider Initiated Contact with Patient 08/05/19 1136     SUBJECTIVE HPI: Leslie Yates is a 29 y.o. G1P0000 at [redacted]w[redacted]d who presents to Maternity Admissions reporting vaginal bleeding. Reports bright red spotting this morning. When she most recently used the bathroom, no longer saw bleeding. Not saturating pads or passing clots. Denies abdominal pain. Had intercourse yesterday.    Past Medical History:  Diagnosis Date  . Asthma   . Obesity   . Ovarian cyst   . Sickle cell trait (HCC)    OB History  Gravida Para Term Preterm AB Living  1 0 0 0 0    SAB TAB Ectopic Multiple Live Births  0 0 0        # Outcome Date GA Lbr Len/2nd Weight Sex Delivery Anes PTL Lv  1 Current            Past Surgical History:  Procedure Laterality Date  . INCISION AND DRAINAGE ABSCESS     Social History   Socioeconomic History  . Marital status: Single    Spouse name: Not on file  . Number of children: Not on file  . Years of education: Not on file  . Highest education level: Some college, no degree  Occupational History  . Not on file  Tobacco Use  . Smoking status: Current Every Day Smoker    Packs/day: 0.25    Years: 11.00    Pack years: 2.75    Types: Cigarettes  . Smokeless tobacco: Never Used  Substance and Sexual Activity  . Alcohol use: Not Currently    Comment: occ  . Drug use: Not Currently    Types: Benzodiazepines, Marijuana    Comment: States she never fill prescription, last time marijuana use yesterday  . Sexual activity: Yes    Birth control/protection: None  Other Topics Concern  . Not on file  Social History Narrative   ** Merged History Encounter **       ** Merged History Encounter ** Patient is having trouble with financial issues.   Social Determinants of Health   Financial Resource Strain: High Risk  . Difficulty of Paying Living Expenses: Hard  Food Insecurity: Food Insecurity Present  . Worried About Community education officer in the Last Year: Sometimes true  . Ran Out of Food in the Last Year: Sometimes true  Transportation Needs: No Transportation Needs  . Lack of Transportation (Medical): No  . Lack of Transportation (Non-Medical): No  Physical Activity: Inactive  . Days of Exercise per Week: 0 days  . Minutes of Exercise per Session: 0 min  Stress: No Stress Concern Present  . Feeling of Stress : Not at all  Social Connections: Moderately Isolated  . Frequency of Communication with Friends and Family: More than three times a week  . Frequency of Social Gatherings with Friends and Family: More than three times a week  . Attends Religious Services: Never  . Active Member of Clubs or Organizations: No  . Attends Banker Meetings: Never  . Marital Status: Never married  Intimate Partner Violence: Not At Risk  . Fear of Current or Ex-Partner: No  . Emotionally Abused: No  . Physically Abused: No  . Sexually Abused: No   Family History  Problem Relation Age of Onset  . Diabetes Mother   . Hypertension Mother   . Diabetes Father   . Hypertension Father    No current facility-administered medications on  file prior to encounter.   Current Outpatient Medications on File Prior to Encounter  Medication Sig Dispense Refill  . acetaminophen (TYLENOL) 325 MG tablet Take 650 mg by mouth every 6 (six) hours as needed for mild pain or headache.    . Blood Pressure Monitoring (BLOOD PRESSURE MONITOR AUTOMAT) DEVI 1 Device by Does not apply route daily. Automatic blood pressure cuff regular/x-large size cuff. To monitor blood pressure regularly at home. ICD-10 code: O09.90. 1 each 0  . Misc. Devices (GOJJI WEIGHT SCALE) MISC 1 each by Does not apply route daily as needed. Patient to weight daily as needed. ICD-10 code: O09.90 1 each 0  . Prenat-Fe Carbonyl-FA-Omega 3 (ONE-A-DAY WOMENS PRENATAL 1) 28-0.8-235 MG CAPS Take 1 capsule by mouth daily. 30 capsule 11  . Prenatal Vit-Fe Fumarate-FA  (PRENATAL COMPLETE) 14-0.4 MG TABS Take 1 tablet by mouth daily. 90 tablet 0  . [DISCONTINUED] albuterol (PROVENTIL HFA;VENTOLIN HFA) 108 (90 Base) MCG/ACT inhaler Inhale 2 puffs into the lungs every 6 (six) hours as needed for wheezing or shortness of breath. (Patient not taking: Reported on 10/27/2018) 1 Inhaler 2  . [DISCONTINUED] pantoprazole (PROTONIX) 40 MG tablet Take 1 tablet (40 mg total) by mouth daily. 30 tablet 0   Allergies  Allergen Reactions  . Penicillins Rash    Childhood rash Has patient had a PCN reaction causing immediate rash, facial/tongue/throat swelling, SOB or lightheadedness with hypotension: no Has patient had a PCN reaction causing severe rash involving mucus membranes or skin necrosis:no Has patient had a PCN reaction that required hospitalization no Has patient had a PCN reaction occurring within the last 10 years: no If all of the above answers are "NO", then may proceed with Cephalosporin use.     I have reviewed patient's Past Medical Hx, Surgical Hx, Family Hx, Social Hx, medications and allergies.   Review of Systems  Constitutional: Negative.   Gastrointestinal: Negative.   Genitourinary: Positive for vaginal bleeding. Negative for dysuria and vaginal discharge.    OBJECTIVE Patient Vitals for the past 24 hrs:  BP Temp Temp src Pulse Resp SpO2 Height Weight  08/05/19 1246 (!) 108/57 - - - - - - -  08/05/19 1103 (!) 113/55 98.4 F (36.9 C) Oral 75 16 99 % 6' (1.829 m) 118.9 kg   Constitutional: Well-developed, well-nourished female in no acute distress.  Cardiovascular: normal rate & rhythm, no murmur Respiratory: normal rate and effort. Lung sounds clear throughout GI: Abd soft, non-tender, Pos BS x 4. No guarding or rebound tenderness MS: Extremities nontender, no edema, normal ROM Neurologic: Alert and oriented x 4.  GU:     SPECULUM EXAM: NEFG, physiologic discharge, no blood noted, cervix clean  BIMANUAL: No CMT. cervix closed    LAB  RESULTS Results for orders placed or performed during the hospital encounter of 08/05/19 (from the past 24 hour(s))  Wet prep, genital     Status: Abnormal   Collection Time: 08/05/19 11:50 AM  Result Value Ref Range   Yeast Wet Prep HPF POC NONE SEEN NONE SEEN   Trich, Wet Prep NONE SEEN NONE SEEN   Clue Cells Wet Prep HPF POC NONE SEEN NONE SEEN   WBC, Wet Prep HPF POC FEW (A) NONE SEEN   Sperm NONE SEEN   Urinalysis, Routine w reflex microscopic     Status: Abnormal   Collection Time: 08/05/19 12:20 PM  Result Value Ref Range   Color, Urine YELLOW YELLOW   APPearance HAZY (A) CLEAR  Specific Gravity, Urine 1.015 1.005 - 1.030   pH 6.0 5.0 - 8.0   Glucose, UA NEGATIVE NEGATIVE mg/dL   Hgb urine dipstick SMALL (A) NEGATIVE   Bilirubin Urine NEGATIVE NEGATIVE   Ketones, ur NEGATIVE NEGATIVE mg/dL   Protein, ur NEGATIVE NEGATIVE mg/dL   Nitrite NEGATIVE NEGATIVE   Leukocytes,Ua NEGATIVE NEGATIVE   RBC / HPF 0-5 0 - 5 RBC/hpf   WBC, UA 0-5 0 - 5 WBC/hpf   Bacteria, UA RARE (A) NONE SEEN   Squamous Epithelial / LPF 0-5 0 - 5   Mucus PRESENT     IMAGING No results found.  MAU COURSE Orders Placed This Encounter  Procedures  . Wet prep, genital  . Urinalysis, Routine w reflex microscopic  . Discharge patient   No orders of the defined types were placed in this encounter.   MDM FHT present via doppler No blood on exam Anatomy ultrasound reviewed - no previa Cervix closed/thick RH positive GC/CT & wet prep collected  ASSESSMENT 1. Vaginal bleeding in pregnancy, second trimester   2. [redacted] weeks gestation of pregnancy     PLAN Discharge home in stable condition. Bleeding precautions GC/CT pending  Allergies as of 08/05/2019      Reactions   Penicillins Rash   Childhood rash Has patient had a PCN reaction causing immediate rash, facial/tongue/throat swelling, SOB or lightheadedness with hypotension: no Has patient had a PCN reaction causing severe rash involving  mucus membranes or skin necrosis:no Has patient had a PCN reaction that required hospitalization no Has patient had a PCN reaction occurring within the last 10 years: no If all of the above answers are "NO", then may proceed with Cephalosporin use.      Medication List    STOP taking these medications   aspirin EC 81 MG tablet   ondansetron 4 MG disintegrating tablet Commonly known as: Zofran ODT   ondansetron 4 MG tablet Commonly known as: ZOFRAN   potassium chloride 10 MEQ tablet Commonly known as: KLOR-CON     TAKE these medications   acetaminophen 325 MG tablet Commonly known as: TYLENOL Take 650 mg by mouth every 6 (six) hours as needed for mild pain or headache.   Blood Pressure Monitor Automat Devi 1 Device by Does not apply route daily. Automatic blood pressure cuff regular/x-large size cuff. To monitor blood pressure regularly at home. ICD-10 code: O09.90.   Gojji Weight Scale Misc 1 each by Does not apply route daily as needed. Patient to weight daily as needed. ICD-10 code: O36.90   One-A-Day Womens Prenatal 1 28-0.8-235 MG Caps Take 1 capsule by mouth daily.   Prenatal Complete 14-0.4 MG Tabs Take 1 tablet by mouth daily.        Judeth Horn, NP 08/05/2019  2:43 PM

## 2019-08-06 ENCOUNTER — Other Ambulatory Visit (HOSPITAL_COMMUNITY): Payer: Self-pay | Admitting: Genetic Counselor

## 2019-08-06 LAB — GC/CHLAMYDIA PROBE AMP (~~LOC~~) NOT AT ARMC
Chlamydia: NEGATIVE
Comment: NEGATIVE
Comment: NORMAL
Neisseria Gonorrhea: NEGATIVE

## 2019-08-07 ENCOUNTER — Other Ambulatory Visit: Payer: Self-pay

## 2019-08-07 ENCOUNTER — Telehealth (INDEPENDENT_AMBULATORY_CARE_PROVIDER_SITE_OTHER): Payer: Medicaid Other

## 2019-08-07 DIAGNOSIS — Z3400 Encounter for supervision of normal first pregnancy, unspecified trimester: Secondary | ICD-10-CM

## 2019-08-07 DIAGNOSIS — Z872 Personal history of diseases of the skin and subcutaneous tissue: Secondary | ICD-10-CM

## 2019-08-07 DIAGNOSIS — Z3A20 20 weeks gestation of pregnancy: Secondary | ICD-10-CM

## 2019-08-07 DIAGNOSIS — O219 Vomiting of pregnancy, unspecified: Secondary | ICD-10-CM

## 2019-08-07 DIAGNOSIS — O99210 Obesity complicating pregnancy, unspecified trimester: Secondary | ICD-10-CM

## 2019-08-07 DIAGNOSIS — O99212 Obesity complicating pregnancy, second trimester: Secondary | ICD-10-CM

## 2019-08-07 MED ORDER — PRENATAL COMPLETE 14-0.4 MG PO TABS: 1.0000 | ORAL_TABLET | Freq: Every day | ORAL | 3 refills | Status: DC

## 2019-08-07 MED ORDER — ASPIRIN 81 MG PO CHEW: 81.0000 mg | CHEWABLE_TABLET | Freq: Every day | ORAL | 1 refills | Status: DC

## 2019-08-07 MED ORDER — ONDANSETRON 4 MG PO TBDP: 4.0000 mg | ORAL_TABLET | Freq: Four times a day (QID) | ORAL | 1 refills | Status: DC | PRN

## 2019-08-07 NOTE — Progress Notes (Signed)
Leslie Yates  Provider location: [facility] at [place]   I connected with [name] on 08/07/19 at 11:10 AM EST by MyChart Video Encounter at home and verified that I am speaking with the correct person using two identifiers.   I discussed the limitations, risks, security and privacy concerns of performing an evaluation and management service virtually and the availability of in person appointments. I also discussed with the patient that there may be a patient responsible charge related to this service. The patient expressed understanding and agreed to proceed. Subjective:  Leslie Yates is a 29 y.o. G1P0000 at 21w1dbeing seen today for ongoing prenatal care.  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  Patient reports that pregnancy is not what people make it out to be!  Contractions: Not present. Vag. Bleeding: None.  Movement: Present. Denies any leaking of fluid. She states she has not felt fetal movement yet. Patient also reports continued nausea.  Patient reports that she has been having pelvic pressure since her evaluation on Wed Feb 10th.  She states that it feels like she is not emptying her bladder completely.  Patient states that she takes 2 tylenols with improvement. Patient reports that she hit her head, about 1.5 weeks ago, and has been having intermittent headaches.  She states that she "felt her brain move" and considered calling 911, but didn't.  Patient states she reported this at her visit on Feb 10th, but was not  evaluated. Patient requests dermatology consult for skin conditions stating that "these cysts keep rupturing and coming back, but they used to just rupture and go away for awhile."  Patient also questions if she can have a referral for her breast lump [facility]   The following portions of the patient's history were reviewed and updated as appropriate: allergies, current medications, past family history, past medical history, past social history, past surgical history and problem list.   Objective:  There were no vitals filed for this visit.  Fetal Status:     Movement: Present     General:  Alert, oriented and cooperative. Patient is in no acute distress.  Respiratory: Normal respiratory effort, no problems with respiration noted  Mental Status: Normal mood and affect. Normal behavior. Normal judgment and thought content.  Rest of physical exam deferred due to type of encounter  Imaging: UKoreaMFM OB DETAIL +14 WK  Result Date: 07/31/2019 ----------------------------------------------------------------------  OBSTETRICS REPORT                       (Signed Final 07/31/2019 09:18 am) ---------------------------------------------------------------------- Patient Info  ID #:       [id number]                         D.O.B.:  [birth year](28 yrs)  Name:       Leslie Yates                   Visit Date: 07/30/2019 01:39 pm ---------------------------------------------------------------------- Performed By  Performed By:     Rodrigo Ran BS      Referred By:      [facility]                    RDMS RVT  Attending:        Tama High MD        [facility] ---------------------------------------------------------------------- Orders   #  Description                          Code         Ordered By   1  [place] MFM OB DETAIL +14 WK              76811.01      Marcille Buffy  ----------------------------------------------------------------------   #  Order #                    Accession #                 Episode #   1  [id number]                  [id number]                  [id number]  ---------------------------------------------------------------------- Indications   Antenatal screening for malformations          Z36.3   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 Cord Insertion:      Visualized  Amniotic Fluid  AFI FV:      Within normal limits                              Largest Pocket(cm)                              3.8 ---------------------------------------------------------------------- Biometry  BPD:      43.6  mm     G. Age:  19w 1d         59  %    CI:        74.69   %  70 - 86                                                          FL/HC:      17.6   %    16.1 - 18.3  HC:      160.1  mm     G. Age:  18w 6d         34  %    HC/AC:      1.18        1.09 - 1.39  AC:      135.4  mm     G. Age:  19w 0d         76  %    FL/BPD:     64.4   %  FL:       28.1  mm     G. Age:  18w 4d         29  %    FL/AC:      20.8   %    20 - 24  HUM:      25.8  mm     G. Age:  18w 0d         25  %  CER:        19  mm     G. Age:  18w 4d         35  %  NFT:       2.2  mm  CM:        3.3  mm  Est. FW:     260  gm      0 lb 9 oz     36  % ---------------------------------------------------------------------- OB History  Gravidity:    1         Term:   0        Prem:   0        SAB:   0  TOP:          0        Ectopic:  0        Living: 0 ---------------------------------------------------------------------- Gestational Age  LMP:           20w 2d        Date:  03/10/19                 EDD:   12/15/19  U/S Today:     18w 6d                                        EDD:   12/25/19  Best:          19w 0d     Det. ByLoman Chroman         EDD:   12/24/19                                      (06/09/19) ---------------------------------------------------------------------- Anatomy  Cranium:               Appears normal  Aortic Arch:            Appears normal  Cavum:                 Appears normal         Ductal Arch:            Appears normal  Ventricles:            Appears normal         Diaphragm:              Appears normal  Choroid Plexus:        Appears normal         Stomach:                Appears normal, left                                                                        sided  Cerebellum:            Appears normal         Abdomen:                Appears normal  Posterior Fossa:       Appears normal         Abdominal Wall:         Appears nml (cord                                                                        insert, abd wall)  Nuchal Fold:           Appears normal         Cord Vessels:           Appears normal (3                                                                        vessel cord)  Face:                  Appears normal         Kidneys:                Appear normal                         (orbits and profile)  Lips:                  Appears normal         Bladder:                Appears normal  Thoracic:  Appears normal         Spine:                  Appears normal  Heart:                 Appears normal         Upper Extremities:      Appears normal                         (4CH, axis, and                         situs)  RVOT:                  Appears normal         Lower Extremities:      Appears normal  LVOT:                  Appears normal  Other:  Heels/feet and  open hands/5th digits visualized. Nasal bone          visualized. ---------------------------------------------------------------------- Cervix Uterus Adnexa  Cervix  Length:           3.66  cm.  Normal appearance by transabdominal scan.  Uterus  No abnormality visualized.  Left Ovary  Within normal limits.  Right Ovary  Known admexal/ovarian cyst, measuring 8.2 x 5.4 x 4.4 cm.  Cul De Sac  No free fluid seen. ---------------------------------------------------------------------- Impression  We performed fetal anatomy scan. No makers of  aneuploidies or fetal structural defects are seen. Fetal  biometry is consistent with her previously-established dates.  Amniotic fluid is normal and good fetal activity is seen.  Patient understands the limitations of ultrasound in detecting  fetal anomalies.  On cell-free fetal DNA screening, the risks of fetal  aneuploidies are not increased.  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  She had met with our genetic counselor (see separate Yates). ----------------------------------------------------------------------                  Tama High, MD Electronically Signed Final Report   07/31/2019 09:18 am ----------------------------------------------------------------------   Assessment and Plan:  Pregnancy: G1P0000 at 40w1d1. Obesity in pregnancy, antepartum -Patient is not taking bASA stating the script was sent to the wrong pharmacy. -Reviewed usage of baby aspirin for reducing preeclampsia risks -Rx sent to [facility] on [address] per patient request.  2. Supervision of normal first pregnancy, antepartum -Reassured that pregnancy can be overwhelming at times. Encouragement given.  -Reviewed UKorearesults-No questions or concerns. -Informed that she was not evaluated for head trauma because we do not do that in MAU.  Instructed to continue to monitor symptoms and if they worsen or are not relieved with tylenol to [facility] -Informed that order for breast UKorearemains active and [facility]. -Anticipatory guidance for upcoming appts.  - Prenatal Vit-Fe Fumarate-FA (PRENATAL COMPLETE) 14-0.4 MG TABS; Take 1 tablet by mouth daily.  Dispense: 90 tablet; Refill: 3  3. History of folliculitis -Referral placed for dermatology -Patient informed that they may not accept Medicaid.  4. Nausea and vomiting in pregnancy -Patient reports taking Zofran in the past with good results for nausea r/t cysts. -Rx for Zofran 438msent to pharmacy.   Preterm labor symptoms and general obstetric precautions including  but not limited to vaginal bleeding, contractions, leaking of fluid and fetal movement were reviewed in detail with the patient. I discussed the assessment and treatment plan with the patient. The patient was provided an opportunity to ask questions and all were answered. The patient agreed with the plan and demonstrated an understanding of the instructions. The patient was advised to call back or seek an in-person office evaluation/go to [facility] at [facility] for any urgent or concerning symptoms. Please refer to After Visit Summary for other counseling recommendations.   I provided 23 minutes of face-to-face time during this encounter.  No follow-ups on file.  Future Appointments  Date Time Provider [place]  09/04/2019 10:10 AM Nugent, Gerrie Nordmann, NP [facility] None  10/01/2019  1:15 PM [place] 4 [facility]  10/02/2019  8:10 AM Jorje Guild, NP [place] None    Maryann Conners, [place] for [facility], [place]

## 2019-08-12 ENCOUNTER — Telehealth (HOSPITAL_COMMUNITY): Payer: Self-pay | Admitting: Genetic Counselor

## 2019-08-12 NOTE — Telephone Encounter (Signed)
I called Ms. Mckethan to discuss her results from expanded carrier screening (ECS). ECS was ordered through the laboratory [facility] as Ms. Stai had limited information about her family history and was interested in learning as much information about her health for her baby as possible. ECS again confirmed that Ms. Bradstreet is a carrier for sickle cell disease and is at increased risk to be a silent carrier for spinal muscular atrophy. 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  We briefly reviewed features of BBS. BBS is an inherited condition characterized by progressive vision loss, obesity, birth defects, learning disabilities, and behavioral problems. Vision loss usually begins in childhood and leads to legal blindness by adolescence or adulthood. Birth defects may include polydactyly (extra fingers/toes), genital anomalies, kidney abnormalities, or congenital heart defects. Symptoms of BBS can vary widely, even among affected individuals in the same family. Additionally, some individuals with BBS may not demonstrate obvious symptoms of the condition.  BBS can result from pathogenic variants in at least 14 different genes. The genes BBS1 and BBS10 are responsible for more than 40% of reported cases. Ms. Amster identified variant is in the BBS10 gene. BBS is inherited in an autosomal recessive fashion, just as sickle cell disease and spinal muscular atrophy are. This means that both parents must be carriers for pathogenic variants in the same BBS-related gene to be at risk of having a child affected by BBS. If both parents are carriers for pathogenic variants in the same BBS gene, a couple would have a 1 in 4 (25%) chance of having a child with BBS. Based on the pan-ethnic carrier frequency for BBS, Ms. Pillars's partner has a 1 in 354 chance of also being a carrier for BBS due to a change in the BBS10 gene specifically.  We  discussed that partner carrier screening is still recommended for Ms. Klemens's partner, [name]. I placed a new order for partner carrier screening that includes the HBB, SMN1, and BBS10 genes to assess Mr. Hawthorne's carrier status for sickle cell disease/other HBB-related hemoglobinopathies, spinal muscular atrophy, and BBS. [facility] will send a new saliva kit to the couple's house. Ms. Ragan was not sure if she still has the Patient Assistance Program form that we had partially filled out during her genetic counseling session to ensure that her partner's testing will be free, but indicated that she will look for it. If she cannot find it, we will fill out a new form together. Ms. Coen confirmed that she had no further questions at this time.  Buelah Manis, MS Genetic Counselor

## 2019-08-20 ENCOUNTER — Inpatient Hospital Stay (HOSPITAL_COMMUNITY)
Admission: AD | Admit: 2019-08-20 | Discharge: 2019-08-20 | Disposition: A | Discharge status: Home / Self Care | Payer: Medicaid Other | Attending: Obstetrics and Gynecology | Admitting: Obstetrics and Gynecology

## 2019-08-20 ENCOUNTER — Encounter (HOSPITAL_COMMUNITY): Payer: Self-pay | Admitting: Obstetrics and Gynecology

## 2019-08-20 ENCOUNTER — Other Ambulatory Visit: Payer: Self-pay

## 2019-08-20 DIAGNOSIS — Z880 Allergy status to penicillin: Secondary | ICD-10-CM | POA: Insufficient documentation

## 2019-08-20 DIAGNOSIS — Z3A22 22 weeks gestation of pregnancy: Secondary | ICD-10-CM | POA: Diagnosis not present

## 2019-08-20 DIAGNOSIS — R110 Nausea: Secondary | ICD-10-CM | POA: Diagnosis not present

## 2019-08-20 DIAGNOSIS — O99212 Obesity complicating pregnancy, second trimester: Secondary | ICD-10-CM

## 2019-08-20 DIAGNOSIS — O99891 Other specified diseases and conditions complicating pregnancy: Secondary | ICD-10-CM | POA: Insufficient documentation

## 2019-08-20 DIAGNOSIS — R1111 Vomiting without nausea: Secondary | ICD-10-CM | POA: Diagnosis not present

## 2019-08-20 DIAGNOSIS — K529 Noninfective gastroenteritis and colitis, unspecified: Secondary | ICD-10-CM

## 2019-08-20 DIAGNOSIS — R197 Diarrhea, unspecified: Secondary | ICD-10-CM | POA: Diagnosis not present

## 2019-08-20 DIAGNOSIS — Z3400 Encounter for supervision of normal first pregnancy, unspecified trimester: Secondary | ICD-10-CM

## 2019-08-20 DIAGNOSIS — Z7982 Long term (current) use of aspirin: Secondary | ICD-10-CM | POA: Insufficient documentation

## 2019-08-20 DIAGNOSIS — O99210 Obesity complicating pregnancy, unspecified trimester: Secondary | ICD-10-CM

## 2019-08-20 DIAGNOSIS — O212 Late vomiting of pregnancy: Secondary | ICD-10-CM | POA: Diagnosis present

## 2019-08-20 LAB — COMPREHENSIVE METABOLIC PANEL
ALT: 10 U/L (ref 0–44)
AST: 13 U/L — ABNORMAL LOW (ref 15–41)
Albumin: 3.4 g/dL — ABNORMAL LOW (ref 3.5–5.0)
Alkaline Phosphatase: 35 U/L — ABNORMAL LOW (ref 38–126)
Anion gap: 13 (ref 5–15)
BUN: 9 mg/dL (ref 6–20)
CO2: 19 mmol/L — ABNORMAL LOW (ref 22–32)
Calcium: 8.8 mg/dL — ABNORMAL LOW (ref 8.9–10.3)
Chloride: 107 mmol/L (ref 98–111)
Creatinine, Ser: 0.61 mg/dL (ref 0.44–1.00)
GFR calc Af Amer: >60 mL/min (ref >60)
GFR calc non Af Amer: >60 mL/min (ref >60)
Glucose, Bld: 141 mg/dL — ABNORMAL HIGH (ref 70–99)
Potassium: 3.1 mmol/L — ABNORMAL LOW (ref 3.5–5.1)
Sodium: 139 mmol/L (ref 135–145)
Total Bilirubin: 0.5 mg/dL (ref 0.3–1.2)
Total Protein: 6.5 g/dL (ref 6.5–8.1)

## 2019-08-20 LAB — CBC
HCT: 31.7 % — ABNORMAL LOW (ref 36.0–46.0)
Hemoglobin: 11.2 g/dL — ABNORMAL LOW (ref 12.0–15.0)
MCH: 31.8 pg (ref 26.0–34.0)
MCHC: 35.3 g/dL (ref 30.0–36.0)
MCV: 90.1 fL (ref 80.0–100.0)
Platelets: 209 10*3/uL (ref 150–400)
RBC: 3.52 MIL/uL — ABNORMAL LOW (ref 3.87–5.11)
RDW: 11.6 % (ref 11.5–15.5)
WBC: 13 10*3/uL — ABNORMAL HIGH (ref 4.0–10.5)
nRBC: 0 % (ref 0.0–0.2)

## 2019-08-20 LAB — CBC WITH DIFFERENTIAL/PLATELET
Abs Immature Granulocytes: 0.05 10*3/uL (ref 0.00–0.07)
Basophils Absolute: 0 10*3/uL (ref 0.0–0.1)
Basophils Relative: 0 %
Eosinophils Absolute: 0 10*3/uL (ref 0.0–0.5)
Eosinophils Relative: 0 %
HCT: 32.2 % — ABNORMAL LOW (ref 36.0–46.0)
Hemoglobin: 11.2 g/dL — ABNORMAL LOW (ref 12.0–15.0)
Immature Granulocytes: 0 %
Lymphocytes Relative: 5 %
Lymphs Abs: 0.7 10*3/uL (ref 0.7–4.0)
MCH: 31.8 pg (ref 26.0–34.0)
MCHC: 34.8 g/dL (ref 30.0–36.0)
MCV: 91.5 fL (ref 80.0–100.0)
Monocytes Absolute: 0.3 10*3/uL (ref 0.1–1.0)
Monocytes Relative: 2 %
Neutro Abs: 12 10*3/uL — ABNORMAL HIGH (ref 1.7–7.7)
Neutrophils Relative %: 93 %
Platelets: 208 10*3/uL (ref 150–400)
RBC: 3.52 MIL/uL — ABNORMAL LOW (ref 3.87–5.11)
RDW: 11.7 % (ref 11.5–15.5)
WBC: 13 10*3/uL — ABNORMAL HIGH (ref 4.0–10.5)
nRBC: 0 % (ref 0.0–0.2)

## 2019-08-20 MED ORDER — PANTOPRAZOLE SODIUM 40 MG IV SOLR
40.0000 mg | Freq: Once | INTRAVENOUS | Status: AC
  Administered 2019-08-20: 40 mg via INTRAVENOUS
  Filled 2019-08-20: qty 40

## 2019-08-20 MED ORDER — LACTATED RINGERS IV BOLUS
1000.0000 mL | Freq: Once | INTRAVENOUS | Status: AC
  Administered 2019-08-20: 11:00:00 1000 mL via INTRAVENOUS

## 2019-08-20 MED ORDER — PROMETHAZINE HCL 25 MG/ML IJ SOLN
12.5000 mg | Freq: Once | INTRAMUSCULAR | Status: AC
  Administered 2019-08-20: 12.5 mg via INTRAVENOUS
  Filled 2019-08-20: qty 1

## 2019-08-20 MED ORDER — SODIUM CHLORIDE 0.9 % IV SOLN
8.0000 mg | Freq: Once | INTRAVENOUS | Status: DC
  Filled 2019-08-20: qty 4

## 2019-08-20 MED ORDER — M.V.I. ADULT IV INJ
Freq: Once | INTRAVENOUS | Status: AC
  Filled 2019-08-20: qty 10

## 2019-08-20 NOTE — MAU Note (Signed)
.   Leslie Yates is a 29 y.o. at [redacted]w[redacted]d here in MAU reporting: Nausea, vomiting x20 times and diarrhea x5 times since 0300 this morning. Patient took 8mg  of zofran at 0700 this morning. Patient reports no VB or LOF, also states she is unable to feel the baby move yet.   Pain score: 7 Vitals:   08/20/19 0949  BP: 138/72  Pulse: (!) 58  Resp: (!) 24  Temp: 97.8 F (36.6 C)  SpO2: 100%     FHT:142 Lab orders placed from triage:

## 2019-08-20 NOTE — Discharge Instructions (Signed)
Norovirus Infection ° °Norovirus infection causes inflammation in the stomach and intestines (gastroenteritis) and food poisoning. It is caused by exposure to a virus in a group of similar viruses called noroviruses. °Norovirus spreads very easily from person to person (is very contagious). It often occurs in places where people are in close contact, such as schools, nursing homes, and cruise ships. You can get it from food, water, surfaces, or other people who have the virus (are contaminated). Norovirus is also found in the stool (feces) or vomit of infected people. You can spread the infection as soon as you feel sick, and you may continue to be contagious after you recover. °What are the causes? °This condition is caused by contact with norovirus. You can catch norovirus if you: °· Eat or drink something that is contaminated with norovirus. °· Touch surfaces or objects that are contaminated with norovirus and then put your hand in or by your mouth or nose. °· Have direct contact with an infected person who has symptoms. °· Share food, drink, or utensils with someone who is sick with norovirus. °What are the signs or symptoms? °Symptoms usually begin within 12 hours to 2 days after you become infected. Most norovirus symptoms affect the digestive system.Symptoms may include: °· Nausea. °· Vomiting. °· Diarrhea. °· Stomach cramps. °· Fever. °· Chills. °· Headache. °· Muscle aches. °· Tiredness. °How is this diagnosed? °This condition may be diagnosed based on: °· Your symptoms. °· A physical exam. °· A stool test. °How is this treated? °There is no specific treatment for norovirus. Most people get better without treatment in about 2 days. Young children, the elderly, and people who are already sick may take up to 6 days to recover. °Follow these instructions at home: °Eating and drinking °· Drink plenty of water to replace fluids that are lost through diarrhea and vomiting. This prevents dehydration. Drink enough  fluid to keep your urine clear or pale yellow. °· Drink clear fluids in small amounts as you are able. Clear fluids include water, ice chips, fruit juice with water added (diluted fruit juice), and low-calorie sports drinks. °? Avoid fluids that contain a lot of sugar or caffeine, such as energy drinks, sports drinks, and soda. °? Avoid alcohol. °· If instructed by your health care provider, drink an oral rehydration solution (ORS). This is a drink that is sold at pharmacies and retail stores. An ORS contains minerals (electrolytes) that you can lose through diarrhea and vomiting. °· Eat bland, easy-to-digest foods in small amounts as you are able. These foods include bananas, applesauce, rice, lean meats, toast, and crackers. °? Avoid spicy or fatty foods. °General instructions ° °· Rest at home while you recover. °· Do not prepare food for others while you are infected. Wait at least 3 days after you recover from the illness to do this. °· Take over-the-counter and prescription medicines only as told by your health care provider. °· Wash your hands frequently with soap and water. If soap and water are not available, use hand sanitizer. °· Make sure that all people in your household wash their hands well and often. °· Keep all follow-up visits as told by your health care provider. This is important. °How is this prevented? °To help prevent the spread of norovirus: °· Stay at home if you are feeling sick. This will reduce the risk of spreading the virus to others. °· Wash your hands often with soap and water for at least 20 seconds, especially after using the   toilet or changing a diaper. °· Wash fruits and vegetables thoroughly before peeling, preparing, or serving them. °· Throw out any food that a sick person may have touched. °· Disinfect contaminated surfaces immediately after someone in the household has been sick. Use a bleach-based household cleaner. °· Immediately remove and wash soiled clothes or  sheets. °Contact a health care provider if: °· You have vomiting, diarrhea, or stomach pain that gets worse. °· You have symptoms that do not go away after 2-6 days. °· You have a fever. °· You cannot drink without vomiting. °· You feel light-headed or dizzy. °· Your symptoms get worse. °Get help right away if: °You develop symptoms of dehydration that do not improve with fluid replacement, such as: °· Excessive sleepiness. °· Lack of tears. °· Very little urine production. °· Dry mouth. °· Muscle cramps. °· Weak pulse. °· Confusion. °Summary °· Norovirus infection is common and often occurs in places where people are in close contact, such as schools, nursing homes, and cruise ships. °· To help prevent the spread of this infection, wash hands with soap and water for at least 20 seconds before handling food or after having contact with stool or body fluids. °· There is no specific treatment for norovirus, but most people get better without treatment in about 2 days. People who are healthy when infected often recover sooner than those who are elderly, young, or already sick. °· Replace lost fluids by drinking plenty of water, or by drinking oral rehydration solution (ORS), which contains important minerals called electrolytes. This prevents dehydration. °This information is not intended to replace advice given to you by your health care provider. Make sure you discuss any questions you have with your health care provider. °Document Revised: 10/03/2018 Document Reviewed: 07/18/2016 °Elsevier Patient Education © 2020 [employer] ° °

## 2019-08-20 NOTE — MAU Provider Note (Signed)
History     CSN: [id number]  Arrival date and time: 08/20/19 1027   First Provider Initiated Contact with Patient 08/20/19 1049      Chief Complaint  Patient presents with  . Morning Sickness  . Diarrhea   Leslie Yates is a 29 y.o. G1P0 at [redacted]w[redacted]d who receives care at [facility].  She presents today for vomiting and Diarrhea.  She states she started having vomiting around 0300 this morning.  She reports the diarrhea started about one hour after.  She states she has been having intermittent vomiting and diarrhea throughout the early morning.  She states she tried to take her Zofran, but with no improvement.  She states she ate [place] food last night prior to the onset of her symptoms.  She reports she has had about 5 bouts of diarrhea and has vomited an unknown number of times.      OB History    Gravida  1   Para  0   Term  0   Preterm  0   AB  0   Living        SAB  0   TAB  0   Ectopic  0   Multiple      Live Births              Past Medical History:  Diagnosis Date  . Asthma   . Obesity   . Ovarian cyst   . Sickle cell trait [facility])     Past Surgical History:  Procedure Laterality Date  . INCISION AND DRAINAGE ABSCESS      Family History  Problem Relation Age of Onset  . Diabetes Mother   . Hypertension Mother   . Diabetes Father   . Hypertension Father     Social History   Tobacco Use  . Smoking status: Current Every Day Smoker    Packs/day: 0.25    Years: 11.00    Pack years: 2.75    Types: Cigarettes  . Smokeless tobacco: Never Used  Substance Use Topics  . Alcohol use: Not Currently    Comment: occ  . Drug use: Not Currently    Types: Benzodiazepines, Marijuana    Comment: States she never fill prescription, last time marijuana use yesterday    Allergies:  Allergies  Allergen Reactions  . Penicillins Rash    Childhood rash Has patient had a PCN reaction causing immediate rash, facial/tongue/throat swelling, SOB or  lightheadedness with hypotension: no Has patient had a PCN reaction causing severe rash involving mucus membranes or skin necrosis:no Has patient had a PCN reaction that required hospitalization no Has patient had a PCN reaction occurring within the last 10 years: no If all of the above answers are "NO", then may proceed with Cephalosporin use.     Medications Prior to Admission  Medication Sig Dispense Refill Last Dose  . acetaminophen (TYLENOL) 325 MG tablet Take 650 mg by mouth every 6 (six) hours as needed for mild pain or headache.   Past Week at Unknown time  . aspirin 81 MG chewable tablet Chew 1 tablet (81 mg total) by mouth daily. 60 tablet 1 Past Week at Unknown time  . ondansetron (ZOFRAN ODT) 4 MG disintegrating tablet Take 1-2 tablets (4-8 mg total) by mouth every 6 (six) hours as needed for nausea or vomiting. 30 tablet 1 08/20/2019 at 0700  . Prenat-Fe Carbonyl-FA-Omega 3 (ONE-A-DAY WOMENS PRENATAL 1) 28-0.8-235 MG CAPS Take 1 capsule by mouth daily. 30 capsule  11 Past Week at Unknown time  . Prenatal Vit-Fe Fumarate-FA (PRENATAL COMPLETE) 14-0.4 MG TABS Take 1 tablet by mouth daily. 90 tablet 3 Past Week at Unknown time  . Blood Pressure Monitoring (BLOOD PRESSURE MONITOR AUTOMAT) DEVI 1 Device by Does not apply route daily. Automatic blood pressure cuff regular/x-large size cuff. To monitor blood pressure regularly at home. ICD-10 code: O09.90. 1 each 0   . Misc. Devices (GOJJI WEIGHT SCALE) MISC 1 each by Does not apply route daily as needed. Patient to weight daily as needed. ICD-10 code: O09.90 1 each 0     Review of Systems  Constitutional: Negative for chills and fever.  Respiratory: Positive for cough and shortness of breath.   Gastrointestinal: Positive for abdominal pain, diarrhea, nausea and vomiting. Negative for blood in stool.  Genitourinary: Negative for difficulty urinating, dysuria, pelvic pain, vaginal bleeding and vaginal discharge.  Musculoskeletal: Negative  for back pain.  Neurological: Negative for dizziness, light-headedness and headaches.   Physical Exam   Blood pressure (!) 127/56, pulse (!) 51, temperature 97.8 F (36.6 C), temperature source Oral, resp. rate (!) 24, height 6' (1.829 m), weight 117.9 kg, last menstrual period 02/28/2019, SpO2 100 %.  Physical Exam  Constitutional: She is oriented to person, place, and time.  HENT:  Head: Normocephalic and atraumatic.  Eyes: Conjunctivae are normal.  Cardiovascular: Normal rate, regular rhythm and normal heart sounds.  Respiratory: Effort normal and breath sounds normal.  GI: Soft.  Musculoskeletal:        General: Normal range of motion.     Cervical back: Normal range of motion.  Neurological: She is alert and oriented to person, place, and time.  Skin: Skin is warm. She is diaphoretic.  Psychiatric: She has a normal mood and affect. Her behavior is normal.    MAU Course  Procedures Results for orders placed or performed during the hospital encounter of 08/20/19 (from the past 24 hour(s))  CBC     Status: Abnormal   Collection Time: 08/20/19 10:30 AM  Result Value Ref Range   WBC 13.0 (H) 4.0 - 10.5 K/uL   RBC 3.52 (L) 3.87 - 5.11 MIL/uL   Hemoglobin 11.2 (L) 12.0 - 15.0 g/dL   HCT 47.0 (L) 96.2 - 83.6 %   MCV 90.1 80.0 - 100.0 fL   MCH 31.8 26.0 - 34.0 pg   MCHC 35.3 30.0 - 36.0 g/dL   RDW 62.9 47.6 - 54.6 %   Platelets 209 150 - 400 K/uL   nRBC 0.0 0.0 - 0.2 %  Comprehensive metabolic panel     Status: Abnormal   Collection Time: 08/20/19 10:30 AM  Result Value Ref Range   Sodium 139 135 - 145 mmol/L   Potassium 3.1 (L) 3.5 - 5.1 mmol/L   Chloride 107 98 - 111 mmol/L   CO2 19 (L) 22 - 32 mmol/L   Glucose, Bld 141 (H) 70 - 99 mg/dL   BUN 9 6 - 20 mg/dL   Creatinine, Ser 5.03 0.44 - 1.00 mg/dL   Calcium 8.8 (L) 8.9 - 10.3 mg/dL   Total Protein 6.5 6.5 - 8.1 g/dL   Albumin 3.4 (L) 3.5 - 5.0 g/dL   AST 13 (L) 15 - 41 U/L   ALT 10 0 - 44 U/L   Alkaline  Phosphatase 35 (L) 38 - 126 U/L   Total Bilirubin 0.5 0.3 - 1.2 mg/dL   GFR calc non Af Amer >60 >60 mL/min   GFR calc Af Amer >  60 >60 mL/min   Anion gap 13 5 - 15  CBC with Differential/Platelet     Status: Abnormal   Collection Time: 08/20/19 10:31 AM  Result Value Ref Range   WBC 13.0 (H) 4.0 - 10.5 K/uL   RBC 3.52 (L) 3.87 - 5.11 MIL/uL   Hemoglobin 11.2 (L) 12.0 - 15.0 g/dL   HCT 70.3 (L) 50.0 - 93.8 %   MCV 91.5 80.0 - 100.0 fL   MCH 31.8 26.0 - 34.0 pg   MCHC 34.8 30.0 - 36.0 g/dL   RDW 18.2 99.3 - 71.6 %   Platelets 208 150 - 400 K/uL   nRBC 0.0 0.0 - 0.2 %   Neutrophils Relative % 93 %   Neutro Abs 12.0 (H) 1.7 - 7.7 K/uL   Lymphocytes Relative 5 %   Lymphs Abs 0.7 0.7 - 4.0 K/uL   Monocytes Relative 2 %   Monocytes Absolute 0.3 0.1 - 1.0 K/uL   Eosinophils Relative 0 %   Eosinophils Absolute 0.0 0.0 - 0.5 K/uL   Basophils Relative 0 %   Basophils Absolute 0.0 0.0 - 0.1 K/uL   Immature Granulocytes 0 %   Abs Immature Granulocytes 0.05 0.00 - 0.07 K/uL    MDM Start IV LR Bolus f/b Banana Bag Antiemetic PPI Assessment and Plan  29 year old, G1P0  SIUP at 20 weeks N/V Diarrhea  -Reviewed POC with patient. -Exam performed and findings discussed.  -Will start IV and give fluids -Phenergan IV -Protonix IV -Discussed how symptoms are suspicious for stomach virus and/or food poisoning. -Labs ordered -Patient without questions or concerns. -Will reassess.   Cherre Robins 08/20/2019, 10:49 AM   Reassessment (1:22 PM) Gastroenteritis  -Patient reports improvement in symptoms, but states "I still feel sick." -Informed that she would be discharged home with precautions for gastroenteritis.  -Instructed to hydrate frequently, take zofran as needed for nausea, and wash hands. -Patient further instructed that symptoms should pass in ~24 hours and if they continue to return to MAU. -Patient without questions or concerns. -Instructed to keep scheduled  appt. -Discharged to home in stable condition.  Cherre Robins MSN, CNM Advanced Practice Provider, [facility] [employer]

## 2019-08-21 ENCOUNTER — Encounter (HOSPITAL_COMMUNITY): Payer: Self-pay | Admitting: Obstetrics and Gynecology

## 2019-08-21 ENCOUNTER — Other Ambulatory Visit: Payer: Self-pay

## 2019-08-21 ENCOUNTER — Inpatient Hospital Stay (EMERGENCY_DEPARTMENT_HOSPITAL)
Admission: AD | Admit: 2019-08-21 | Discharge: 2019-08-21 | Disposition: A | Discharge status: Home / Self Care | Payer: Medicaid Other | Source: Home / Self Care | Attending: Obstetrics and Gynecology | Admitting: Obstetrics and Gynecology

## 2019-08-21 DIAGNOSIS — O99332 Smoking (tobacco) complicating pregnancy, second trimester: Secondary | ICD-10-CM | POA: Insufficient documentation

## 2019-08-21 DIAGNOSIS — R1084 Generalized abdominal pain: Secondary | ICD-10-CM | POA: Diagnosis not present

## 2019-08-21 DIAGNOSIS — H0520 Unspecified exophthalmos: Secondary | ICD-10-CM | POA: Diagnosis not present

## 2019-08-21 DIAGNOSIS — R52 Pain, unspecified: Secondary | ICD-10-CM | POA: Diagnosis not present

## 2019-08-21 DIAGNOSIS — Z880 Allergy status to penicillin: Secondary | ICD-10-CM | POA: Insufficient documentation

## 2019-08-21 DIAGNOSIS — O99210 Obesity complicating pregnancy, unspecified trimester: Secondary | ICD-10-CM

## 2019-08-21 DIAGNOSIS — Z209 Contact with and (suspected) exposure to unspecified communicable disease: Secondary | ICD-10-CM | POA: Diagnosis not present

## 2019-08-21 DIAGNOSIS — O99342 Other mental disorders complicating pregnancy, second trimester: Secondary | ICD-10-CM | POA: Diagnosis not present

## 2019-08-21 DIAGNOSIS — Z7982 Long term (current) use of aspirin: Secondary | ICD-10-CM | POA: Insufficient documentation

## 2019-08-21 DIAGNOSIS — O219 Vomiting of pregnancy, unspecified: Secondary | ICD-10-CM | POA: Diagnosis not present

## 2019-08-21 DIAGNOSIS — Z3400 Encounter for supervision of normal first pregnancy, unspecified trimester: Secondary | ICD-10-CM

## 2019-08-21 DIAGNOSIS — Z20822 Contact with and (suspected) exposure to covid-19: Secondary | ICD-10-CM | POA: Insufficient documentation

## 2019-08-21 DIAGNOSIS — O99012 Anemia complicating pregnancy, second trimester: Secondary | ICD-10-CM | POA: Diagnosis not present

## 2019-08-21 DIAGNOSIS — R112 Nausea with vomiting, unspecified: Secondary | ICD-10-CM | POA: Diagnosis not present

## 2019-08-21 DIAGNOSIS — F1290 Cannabis use, unspecified, uncomplicated: Secondary | ICD-10-CM | POA: Diagnosis not present

## 2019-08-21 DIAGNOSIS — Z3A22 22 weeks gestation of pregnancy: Secondary | ICD-10-CM | POA: Insufficient documentation

## 2019-08-21 DIAGNOSIS — R0602 Shortness of breath: Secondary | ICD-10-CM | POA: Diagnosis not present

## 2019-08-21 DIAGNOSIS — D573 Sickle-cell trait: Secondary | ICD-10-CM | POA: Diagnosis not present

## 2019-08-21 DIAGNOSIS — F445 Conversion disorder with seizures or convulsions: Secondary | ICD-10-CM | POA: Diagnosis not present

## 2019-08-21 DIAGNOSIS — O99322 Drug use complicating pregnancy, second trimester: Secondary | ICD-10-CM | POA: Insufficient documentation

## 2019-08-21 DIAGNOSIS — O210 Mild hyperemesis gravidarum: Secondary | ICD-10-CM | POA: Diagnosis present

## 2019-08-21 DIAGNOSIS — F17210 Nicotine dependence, cigarettes, uncomplicated: Secondary | ICD-10-CM | POA: Diagnosis not present

## 2019-08-21 DIAGNOSIS — F12188 Cannabis abuse with other cannabis-induced disorder: Secondary | ICD-10-CM | POA: Insufficient documentation

## 2019-08-21 HISTORY — DX: Drug use complicating pregnancy, second trimester: O99.322

## 2019-08-21 LAB — COMPREHENSIVE METABOLIC PANEL
ALT: 10 U/L (ref 0–44)
AST: 16 U/L (ref 15–41)
Albumin: 3.5 g/dL (ref 3.5–5.0)
Alkaline Phosphatase: 33 U/L — ABNORMAL LOW (ref 38–126)
Anion gap: 12 (ref 5–15)
BUN: 7 mg/dL (ref 6–20)
CO2: 19 mmol/L — ABNORMAL LOW (ref 22–32)
Calcium: 9 mg/dL (ref 8.9–10.3)
Chloride: 111 mmol/L (ref 98–111)
Creatinine, Ser: 0.71 mg/dL (ref 0.44–1.00)
GFR calc Af Amer: >60 mL/min (ref >60)
GFR calc non Af Amer: >60 mL/min (ref >60)
Glucose, Bld: 124 mg/dL — ABNORMAL HIGH (ref 70–99)
Potassium: 3.1 mmol/L — ABNORMAL LOW (ref 3.5–5.1)
Sodium: 142 mmol/L (ref 135–145)
Total Bilirubin: 0.8 mg/dL (ref 0.3–1.2)
Total Protein: 6.5 g/dL (ref 6.5–8.1)

## 2019-08-21 LAB — URINALYSIS, ROUTINE W REFLEX MICROSCOPIC
Bilirubin Urine: NEGATIVE
Glucose, UA: NEGATIVE mg/dL
Hgb urine dipstick: NEGATIVE
Ketones, ur: 80 mg/dL — AB
Leukocytes,Ua: NEGATIVE
Nitrite: NEGATIVE
Protein, ur: 100 mg/dL — AB
Specific Gravity, Urine: 1.019 (ref 1.005–1.030)
pH: 6 (ref 5.0–8.0)

## 2019-08-21 LAB — CBC WITH DIFFERENTIAL/PLATELET
Abs Immature Granulocytes: 0.11 10*3/uL — ABNORMAL HIGH (ref 0.00–0.07)
Basophils Absolute: 0 10*3/uL (ref 0.0–0.1)
Basophils Relative: 0 %
Eosinophils Absolute: 0 10*3/uL (ref 0.0–0.5)
Eosinophils Relative: 0 %
HCT: 31.2 % — ABNORMAL LOW (ref 36.0–46.0)
Hemoglobin: 11 g/dL — ABNORMAL LOW (ref 12.0–15.0)
Immature Granulocytes: 1 %
Lymphocytes Relative: 6 %
Lymphs Abs: 1.1 10*3/uL (ref 0.7–4.0)
MCH: 31.9 pg (ref 26.0–34.0)
MCHC: 35.3 g/dL (ref 30.0–36.0)
MCV: 90.4 fL (ref 80.0–100.0)
Monocytes Absolute: 0.9 10*3/uL (ref 0.1–1.0)
Monocytes Relative: 5 %
Neutro Abs: 15 10*3/uL — ABNORMAL HIGH (ref 1.7–7.7)
Neutrophils Relative %: 88 %
Platelets: 209 10*3/uL (ref 150–400)
RBC: 3.45 MIL/uL — ABNORMAL LOW (ref 3.87–5.11)
RDW: 11.9 % (ref 11.5–15.5)
WBC: 17 10*3/uL — ABNORMAL HIGH (ref 4.0–10.5)
nRBC: 0 % (ref 0.0–0.2)

## 2019-08-21 LAB — RAPID URINE DRUG SCREEN, HOSP PERFORMED
Amphetamines: NOT DETECTED
Barbiturates: NOT DETECTED
Benzodiazepines: NOT DETECTED
Cocaine: NOT DETECTED
Opiates: NOT DETECTED
Tetrahydrocannabinol: POSITIVE — AB

## 2019-08-21 LAB — SARS CORONAVIRUS 2 (TAT 6-24 HRS): SARS Coronavirus 2: NEGATIVE

## 2019-08-21 LAB — LIPASE, BLOOD: Lipase: 20 U/L (ref 11–51)

## 2019-08-21 MED ORDER — POTASSIUM CHLORIDE 20 MEQ PO PACK
40.0000 meq | PACK | Freq: Two times a day (BID) | ORAL | Status: DC
  Administered 2019-08-21: 40 meq via ORAL
  Filled 2019-08-21: qty 2

## 2019-08-21 MED ORDER — LIDOCAINE VISCOUS HCL 2 % MT SOLN
15.0000 mL | Freq: Once | OROMUCOSAL | Status: AC
  Administered 2019-08-21: 15 mL via ORAL
  Filled 2019-08-21: qty 15

## 2019-08-21 MED ORDER — POTASSIUM CHLORIDE 10 MEQ/100ML IV SOLN
10.0000 meq | INTRAVENOUS | Status: DC
  Filled 2019-08-21 (×2): qty 100

## 2019-08-21 MED ORDER — SCOPOLAMINE 1 MG/3DAYS TD PT72
1.0000 | MEDICATED_PATCH | TRANSDERMAL | Status: DC
  Administered 2019-08-21: 1.5 mg via TRANSDERMAL
  Filled 2019-08-21: qty 1

## 2019-08-21 MED ORDER — PROMETHAZINE HCL 25 MG/ML IJ SOLN
25.0000 mg | Freq: Once | INTRAMUSCULAR | Status: AC
  Administered 2019-08-21: 25 mg via INTRAVENOUS
  Filled 2019-08-21: qty 1

## 2019-08-21 MED ORDER — ONDANSETRON 4 MG PO TBDP: 4.0000 mg | ORAL_TABLET | Freq: Four times a day (QID) | ORAL | 1 refills | Status: DC | PRN

## 2019-08-21 MED ORDER — PANTOPRAZOLE SODIUM 40 MG IV SOLR
40.0000 mg | Freq: Once | INTRAVENOUS | Status: AC
  Administered 2019-08-21: 40 mg via INTRAVENOUS
  Filled 2019-08-21: qty 40

## 2019-08-21 MED ORDER — PROMETHAZINE HCL 25 MG PO TABS: 25.0000 mg | ORAL_TABLET | Freq: Four times a day (QID) | ORAL | 0 refills | Status: DC | PRN

## 2019-08-21 MED ORDER — LACTATED RINGERS IV SOLN: INTRAVENOUS | Status: DC

## 2019-08-21 MED ORDER — POTASSIUM CHLORIDE 10 MEQ/100ML IV SOLN
10.0000 meq | INTRAVENOUS | Status: AC
  Administered 2019-08-21 (×2): 10 meq via INTRAVENOUS
  Filled 2019-08-21 (×2): qty 100

## 2019-08-21 MED ORDER — ALUM & MAG HYDROXIDE-SIMETH 200-200-20 MG/5ML PO SUSP
30.0000 mL | Freq: Once | ORAL | Status: AC
  Administered 2019-08-21: 30 mL via ORAL
  Filled 2019-08-21: qty 30

## 2019-08-21 MED ORDER — POTASSIUM CHLORIDE 20 MEQ PO PACK
40.0000 meq | PACK | Freq: Once | ORAL | Status: DC
  Filled 2019-08-21: qty 2

## 2019-08-21 NOTE — MAU Note (Signed)
Pt said she has "to feed her baby" and drank a large amount of Gatorade she brought in with her. Then she proceeded to vomit a large amount of vomit in the emesis bag.   I told her not to drink anything else for now because most likely she will just throw it up.

## 2019-08-21 NOTE — MAU Note (Signed)
Arrived by EMS, Was here yesterday for vomiting, Left paper prescription here, has not started any medications for vomiting, hasn't ate or drank anything since yesterday after leaving. Abd pain over 24 hours like stabbing pains. No bleeding. 6 loose bm in 24 hours. Vomited several times.

## 2019-08-21 NOTE — Discharge Instructions (Signed)
Follow these instructions at home: Eating and drinking   Avoid the following: ? Drinking fluids with meals. Try not to drink anything during the 30 minutes before and after your meals. ? Drinking more than 1 cup of fluid at a time. ? Eating foods that trigger your symptoms. These may include spicy foods, coffee, high-fat foods, very sweet foods, and acidic foods. ? Skipping meals. Nausea can be more intense on an empty stomach. If you cannot tolerate food, do not force it. Try sucking on ice chips or other frozen items and make up for missed calories later. ? Lying down within 2 hours after eating. ? Being exposed to environmental triggers. These may include food smells, smoky rooms, closed spaces, rooms with strong smells, warm or humid places, overly loud and noisy rooms, and rooms with motion or flickering lights. Try eating meals in a well-ventilated area that is free of strong smells. ? Quick and sudden changes in your movement. ? Taking iron pills and multivitamins that contain iron. If you take prescription iron pills, do not stop taking them unless your health care provider approves. ? Preparing food. The smell of food can spoil your appetite or trigger nausea.  To help relieve your symptoms: ? Listen to your body. Everyone is different and has different preferences. Find what works best for you. ? Eat and drink slowly. ? Eat 5-6 small meals daily instead of 3 large meals. Eating small meals and snacks can help you avoid an empty stomach. ? In the morning, before getting out of bed, eat a couple of crackers to avoid moving around on an empty stomach. ? Try eating starchy foods as these are usually tolerated well. Examples include cereal, toast, bread, potatoes, pasta, rice, and pretzels. ? Include at least 1 serving of protein with your meals and snacks. Protein options include lean meats, poultry, seafood, beans, nuts, nut butters, eggs, cheese, and yogurt. ? Try eating a protein-rich  snack before bed. Examples of a protein-rick snack include cheese and crackers or a peanut butter sandwich made with 1 slice of whole-wheat bread and 1 tsp (5 g) of peanut butter. ? Eat or suck on things that have ginger in them. It may help relieve nausea. Add  tsp ground ginger to hot tea or choose ginger tea. ? Try drinking 100% fruit juice or an electrolyte drink. An electrolyte drink contains sodium, potassium, and chloride. ? Drink fluids that are cold, clear, and carbonated or sour. Examples include lemonade, ginger ale, lemon-lime soda, ice water, and sparkling water. ? Brush your teeth or use a mouth rinse after meals. ? Talk with your health care provider about starting a supplement of vitamin B6. General instructions  Take over-the-counter and prescription medicines only as told by your health care provider.  Follow instructions from your health care provider about eating or drinking restrictions.  Continue to take your prenatal vitamins as told by your health care provider. If you are having trouble taking your prenatal vitamins, talk with your health care provider about different options.  Keep all follow-up and pre-birth (prenatal) visits as told by your health care provider. This is important. Contact a health care provider if:  You have pain in your abdomen.  You have a severe headache.  You have vision problems.  You are losing weight.  You feel weak or dizzy. Get help right away if:  You cannot drink fluids without vomiting.  You vomit blood.  You have constant nausea and vomiting.  You are   very weak.  You faint.  You have a fever and your symptoms suddenly get worse. Summary  Making some changes to your eating habits may help relieve nausea and vomiting.  This condition may be managed with medicine.  If medicines do not help relieve nausea and vomiting, you may need to receive fluids through an IV at the hospital. This information is not intended to  replace advice given to you by your health care provider. Make sure you discuss any questions you have with your health care provider. Document Revised: 07/01/2017 Document Reviewed: 02/08/2016 Elsevier Patient Education  2020 [place].    Cannabinoid Hyperemesis Syndrome Cannabinoid hyperemesis syndrome (CHS) is a condition that causes repeated nausea, vomiting, and abdominal pain after long-term (chronic) use of marijuana (cannabis). People with CHS typically use marijuana 3-5 times a day for many years before they have symptoms, although it is possible to develop CHS with as little as 1 use per day. Symptoms of CHS may be mild at first but can get worse and more frequent. In some cases, CHS may cause vomiting many times a day, which can lead to weight loss and dehydration. CHS may go away and come back many times (recur). People may not have symptoms or may otherwise be healthy in between [facility] attacks. What are the causes? The exact cause of this condition is not known. Long-term use of marijuana may over-stimulate certain proteins in the brain that react with chemicals in marijuana (cannabinoid receptors). This over-stimulation may cause CHS. What are the signs or symptoms? Symptoms of this condition are often mild during the first few attacks, but they can get worse over time. Symptoms may include:  Frequent nausea, especially early in the morning.  Vomiting.  Abdominal pain. Taking several hot showers throughout the day can also be a sign of this condition. People with CHS may do this because it relieves symptoms. How is this diagnosed? This condition may be diagnosed based on:  Your symptoms and medical history, including any drug use.  A physical exam. You may have tests done to rule out other problems. These tests may include:  Blood tests.  Urine tests.  Imaging tests, such as an X-ray or CT scan. How is this treated? Treatment for this condition involves stopping  marijuana use. Your health care provider may recommend:  A drug rehabilitation program, if you have trouble stopping marijuana use.  Medicines for nausea.  Hot showers to help relieve symptoms. Certain creams that contain a substance called capsaicin may improve symptoms when applied to the abdomen. Ask your health care provider before starting any medicines or other treatments. Severe nausea and vomiting may require you to stay at the hospital. You may need IV fluids to prevent or treat dehydration. You may also need certain medicines that must be given at the hospital. Follow these instructions at home: During an attack   Stay in bed and rest in a dark, quiet room.  Take anti-nausea medicine as told by your health care provider.  Try taking hot showers to relieve your symptoms. After an attack  Drink small amounts of clear fluids slowly. Gradually add more.  Once you are able to eat without vomiting, eat soft foods in small amounts every 3-4 hours. General instructions   Do not use any products that contain marijuana.If you need help quitting, ask your health care provider for resources and treatment options.  Drink enough fluid to keep your urine pale yellow. Avoid drinking fluids that have a lot  of sugar or caffeine, such as coffee and soda.  Take and apply over-the-counter and prescription medicines only as told by your health care provider. Ask your health care provider before starting any new medicines or treatments.  Keep all follow-up visits as told by your health care provider. This is important. Contact a health care provider if:  Your symptoms get worse.  You cannot drink fluids without vomiting.  You have pain and trouble swallowing after an attack. Get help right away if:  You cannot stop vomiting.  You have blood in your vomit or your vomit looks like coffee grounds.  You have severe abdominal pain.  You have stools that are bloody or black, or stools  that look like tar.  You have symptoms of dehydration, such as: ? Sunken eyes. ? Inability to make tears. ? Cracked lips. ? Dry mouth. ? Decreased urine production. ? Weakness. ? Sleepiness. ? Fainting. Summary  Cannabinoid hyperemesis syndrome (CHS) is a condition that causes repeated nausea, vomiting, and abdominal pain after long-term use of marijuana.  People with CHS typically use marijuana 3-5 times a day for many years before they have symptoms, although it is possible to develop CHS with as little as 1 use per day.  Treatment for this condition involves stopping marijuana use. Hot showers and capsaicin creams may also help relieve symptoms. Ask your health care provider before starting any medicines or other treatments.  Your health care provider may prescribe medicines to help with nausea.  Get help right away if you have signs of dehydration, such as dry mouth, decreased urine production, or weakness. This information is not intended to replace advice given to you by your health care provider. Make sure you discuss any questions you have with your health care provider. Document Revised: 10/18/2017 Document Reviewed: 09/19/2016 Elsevier Patient Education  2020 [employer].

## 2019-08-21 NOTE — MAU Note (Signed)
Immediately following taking ordered oral potassium - pt vomited all back up. Notified [place].

## 2019-08-21 NOTE — MAU Provider Note (Addendum)
Chief Complaint:  Emesis   First Provider Initiated Contact with Patient 08/21/19 0414     HPI: Leslie Yates is a 29 y.o. G1P0000 at 31w1dwho presents to maternity admissions reporting vomiting and diarrhea for the past 24 hours.  Seen about 24 hours ago for same.  States did not get papers so never filled prescriptions for meds.  . She denies LOF, vaginal bleeding, vaginal itching/burning, urinary symptoms, h/a, dizziness, constipation or fever.  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  Several admissions. Never went to GI consults. Often did not fill Rxes due to no insurance.  There are at least 4 notes referring to EMT and RN observation of patient sticking finger down her throat to induce vomiting "because something is stuck".    Weights: 06/12/19:  118.1kg 07/10/19: 119.9 08/05/19: 118.9 08/20/19: 117.9  Emesis  This is a recurrent problem. The current episode started more than 1 year ago. The problem has been waxing and waning. There has been no fever. Associated symptoms include abdominal pain (cramping/colicky), chills and diarrhea. Pertinent negatives include no dizziness, fever, myalgias or sweats. She has tried nothing (never filled Rx) for the symptoms.    RN Note: Arrived by EMS, Was here yesterday for vomiting, Left paper prescription here, has not started any medications for vomiting, hasn't ate or drank anything since yesterday after leaving. Abd pain over 24 hours like stabbing pains. No bleeding. 6 loose bm in 24 hours. Vomited several times.   Past Medical History: Past Medical History:  Diagnosis Date  . Asthma   . Obesity   . Ovarian cyst   . Sickle cell trait (HCC)     Past obstetric history: OB History  Gravida Para Term Preterm AB Living  1 0 0 0 0    SAB TAB Ectopic Multiple Live Births  0 0 0        # Outcome Date GA Lbr Len/2nd Weight Sex Delivery Anes PTL Lv  1  Current             Past Surgical History: Past Surgical History:  Procedure Laterality Date  . INCISION AND DRAINAGE ABSCESS      Family History: Family History  Problem Relation Age of Onset  . Diabetes Mother   . Hypertension Mother   . Diabetes Father   . Hypertension Father     Social History: Social History   Tobacco Use  . Smoking status: Current Every Day Smoker    Packs/day: 0.25    Years: 11.00    Pack years: 2.75    Types: Cigarettes  . Smokeless tobacco: Never Used  Substance Use Topics  . Alcohol use: Not Currently    Comment: occ  . Drug use: Not Currently    Types: Benzodiazepines, Marijuana    Comment: States she never fill prescription, last time marijuana use yesterday    Allergies:  Allergies  Allergen Reactions  . Penicillins Rash    Childhood rash Has patient had a PCN reaction causing immediate rash, facial/tongue/throat swelling, SOB or lightheadedness with hypotension: no Has patient had a PCN reaction causing severe rash involving mucus membranes or skin necrosis:no Has patient had a PCN reaction that required hospitalization no Has patient had a PCN reaction occurring within the last 10 years: no If all of the above answers are "NO", then may proceed with Cephalosporin use.     Meds:  Medications Prior to  Admission  Medication Sig Dispense Refill Last Dose  . acetaminophen (TYLENOL) 325 MG tablet Take 650 mg by mouth every 6 (six) hours as needed for mild pain or headache.     Marland Kitchen aspirin 81 MG chewable tablet Chew 1 tablet (81 mg total) by mouth daily. 60 tablet 1   . Blood Pressure Monitoring (BLOOD PRESSURE MONITOR AUTOMAT) DEVI 1 Device by Does not apply route daily. Automatic blood pressure cuff regular/x-large size cuff. To monitor blood pressure regularly at home. ICD-10 code: O09.90. 1 each 0   . Misc. Devices (GOJJI WEIGHT SCALE) MISC 1 each by Does not apply route daily as needed. Patient to weight daily as needed. ICD-10 code:  O09.90 1 each 0   . ondansetron (ZOFRAN ODT) 4 MG disintegrating tablet Take 1-2 tablets (4-8 mg total) by mouth every 6 (six) hours as needed for nausea or vomiting. 30 tablet 1   . Prenat-Fe Carbonyl-FA-Omega 3 (ONE-A-DAY WOMENS PRENATAL 1) 28-0.8-235 MG CAPS Take 1 capsule by mouth daily. 30 capsule 11   . Prenatal Vit-Fe Fumarate-FA (PRENATAL COMPLETE) 14-0.4 MG TABS Take 1 tablet by mouth daily. 90 tablet 3     I have reviewed patient's Past Medical Hx, Surgical Hx, Family Hx, Social Hx, medications and allergies.   ROS:  Review of Systems  Unable to perform ROS: Acuity of condition (retching, coughing, rocking)  Constitutional: Positive for chills. Negative for fever.  HENT: Negative for trouble swallowing (feels something stuck in esophagus).   Respiratory: Negative for shortness of breath.   Gastrointestinal: Positive for abdominal pain (cramping/colicky), diarrhea, nausea and vomiting. Negative for constipation.  Genitourinary: Negative for pelvic pain, vaginal bleeding and vaginal discharge.  Musculoskeletal: Negative for back pain and myalgias.  Neurological: Negative for dizziness and light-headedness.  Psychiatric/Behavioral: Positive for self-injury (sticking fingers down throat).   Other systems negative  Physical Exam   Vitals:   08/21/19 0412  BP: 133/71  Pulse: 63  Resp: 20  Temp: 98.2 F (36.8 C)  TempSrc: Oral    Constitutional: Well-developed female.  Rocking and coughing until she vomits.  Observed sticking fingers down her throat to induce vomiting "because it is stuck in there".  Cardiovascular: normal rate and rhythm Respiratory: normal effort, clear to auscultation bilaterally GI: Abd soft, non-tender (but states it "hurts all over"), gravid appropriate for gestational age.   No rebound or guarding. MS: Extremities nontender, no edema, normal ROM Neurologic: Alert and oriented x 4.  GU: Neg CVAT.  PELVIC EXAM:  deferred  FHT:  Per RN    Labs: Results for orders placed or performed during the hospital encounter of 08/21/19 (from the past 24 hour(s))  CBC with Differential/Platelet     Status: Abnormal   Collection Time: 08/21/19  4:30 AM  Result Value Ref Range   WBC 17.0 (H) 4.0 - 10.5 K/uL   RBC 3.45 (L) 3.87 - 5.11 MIL/uL   Hemoglobin 11.0 (L) 12.0 - 15.0 g/dL   HCT 29.9 (L) 37.1 - 69.6 %   MCV 90.4 80.0 - 100.0 fL   MCH 31.9 26.0 - 34.0 pg   MCHC 35.3 30.0 - 36.0 g/dL   RDW 78.9 38.1 - 01.7 %   Platelets 209 150 - 400 K/uL   nRBC 0.0 0.0 - 0.2 %   Neutrophils Relative % 88 %   Neutro Abs 15.0 (H) 1.7 - 7.7 K/uL   Lymphocytes Relative 6 %   Lymphs Abs 1.1 0.7 - 4.0 K/uL   Monocytes Relative 5 %  Monocytes Absolute 0.9 0.1 - 1.0 K/uL   Eosinophils Relative 0 %   Eosinophils Absolute 0.0 0.0 - 0.5 K/uL   Basophils Relative 0 %   Basophils Absolute 0.0 0.0 - 0.1 K/uL   Immature Granulocytes 1 %   Abs Immature Granulocytes 0.11 (H) 0.00 - 0.07 K/uL  Comprehensive metabolic panel     Status: Abnormal   Collection Time: 08/21/19  4:30 AM  Result Value Ref Range   Sodium 142 135 - 145 mmol/L   Potassium 3.1 (L) 3.5 - 5.1 mmol/L   Chloride 111 98 - 111 mmol/L   CO2 19 (L) 22 - 32 mmol/L   Glucose, Bld 124 (H) 70 - 99 mg/dL   BUN 7 6 - 20 mg/dL   Creatinine, Ser 3.71 0.44 - 1.00 mg/dL   Calcium 9.0 8.9 - 06.2 mg/dL   Total Protein 6.5 6.5 - 8.1 g/dL   Albumin 3.5 3.5 - 5.0 g/dL   AST 16 15 - 41 U/L   ALT 10 0 - 44 U/L   Alkaline Phosphatase 33 (L) 38 - 126 U/L   Total Bilirubin 0.8 0.3 - 1.2 mg/dL   GFR calc non Af Amer >60 >60 mL/min   GFR calc Af Amer >60 >60 mL/min   Anion gap 12 5 - 15  Lipase, blood     Status: None   Collection Time: 08/21/19  4:30 AM  Result Value Ref Range   Lipase 20 11 - 51 U/L    O/Positive/-- (12/18 1029)  Imaging:    MAU Course/MDM: I have ordered labs.  Coughing and retching.  Was witnessed putting finger down her throat to induce vomiting.  Then pulled out  bottle of gatorade and swigged large amount "to feed the baby".  Immediately vomited after drinking.  Was told not to drink anything else.  We started IV fluids for hydration.  Within a minute or two of IV start (no meds), immediately ceased coughing and retching, layed back and closed eyes as if sleeping.    Will give Phenergan and IV fluids. Given GI cocktail with lidocaine to soothe sensation in esophagus.   Will check lab results.  Labs normal except for leukocytosis (?reaction to prolonged vomiting), hypokalemia (3.1). Will give two runs of IV Potassium.  Will give dose of protonix.  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 Self inducement of vomiting, possibly due to esophagitis vs other reason History of diarrhea, none while here  Mild hypokalemia  Plan: Report given to oncoming provider   Wynelle Bourgeois CNM, MSN Certified Nurse-Midwife 08/21/2019 4:15 AM   Care assumed of patient on 08/21/2019 at 0800  Results for orders placed or performed during the hospital encounter of 08/21/19 (from the past 24 hour(s))  SARS CORONAVIRUS 2 (TAT 6-24 HRS) Nasopharyngeal Nasopharyngeal Swab     Status: None   Collection Time: 08/21/19  4:25 AM   Specimen: Nasopharyngeal Swab  Result Value Ref Range   SARS Coronavirus 2 NEGATIVE NEGATIVE  CBC with Differential/Platelet     Status: Abnormal   Collection Time: 08/21/19  4:30 AM  Result Value Ref Range   WBC 17.0 (H) 4.0 - 10.5 K/uL   RBC 3.45 (L) 3.87 - 5.11 MIL/uL   Hemoglobin 11.0 (L) 12.0 - 15.0 g/dL   HCT 69.4 (L) 85.4 - 62.7 %   MCV 90.4 80.0 - 100.0 fL   MCH 31.9 26.0 - 34.0 pg   MCHC 35.3 30.0 - 36.0 g/dL  RDW 11.9 11.5 - 15.5 %   Platelets 209 150 - 400 K/uL   nRBC 0.0 0.0 - 0.2 %   Neutrophils Relative % 88 %   Neutro Abs 15.0 (H) 1.7 - 7.7 K/uL   Lymphocytes Relative 6 %   Lymphs Abs 1.1 0.7 - 4.0 K/uL   Monocytes Relative 5 %   Monocytes Absolute 0.9 0.1 - 1.0  K/uL   Eosinophils Relative 0 %   Eosinophils Absolute 0.0 0.0 - 0.5 K/uL   Basophils Relative 0 %   Basophils Absolute 0.0 0.0 - 0.1 K/uL   Immature Granulocytes 1 %   Abs Immature Granulocytes 0.11 (H) 0.00 - 0.07 K/uL  Comprehensive metabolic panel     Status: Abnormal   Collection Time: 08/21/19  4:30 AM  Result Value Ref Range   Sodium 142 135 - 145 mmol/L   Potassium 3.1 (L) 3.5 - 5.1 mmol/L   Chloride 111 98 - 111 mmol/L   CO2 19 (L) 22 - 32 mmol/L   Glucose, Bld 124 (H) 70 - 99 mg/dL   BUN 7 6 - 20 mg/dL   Creatinine, Ser 3.32 0.44 - 1.00 mg/dL   Calcium 9.0 8.9 - 95.1 mg/dL   Total Protein 6.5 6.5 - 8.1 g/dL   Albumin 3.5 3.5 - 5.0 g/dL   AST 16 15 - 41 U/L   ALT 10 0 - 44 U/L   Alkaline Phosphatase 33 (L) 38 - 126 U/L   Total Bilirubin 0.8 0.3 - 1.2 mg/dL   GFR calc non Af Amer >60 >60 mL/min   GFR calc Af Amer >60 >60 mL/min   Anion gap 12 5 - 15  Lipase, blood     Status: None   Collection Time: 08/21/19  4:30 AM  Result Value Ref Range   Lipase 20 11 - 51 U/L  Urine rapid drug screen (hosp performed)     Status: Abnormal   Collection Time: 08/21/19  6:00 AM  Result Value Ref Range   Opiates NONE DETECTED NONE DETECTED   Cocaine NONE DETECTED NONE DETECTED   Benzodiazepines NONE DETECTED NONE DETECTED   Amphetamines NONE DETECTED NONE DETECTED   Tetrahydrocannabinol POSITIVE (A) NONE DETECTED   Barbiturates NONE DETECTED NONE DETECTED  Urinalysis, Routine w reflex microscopic     Status: Abnormal   Collection Time: 08/21/19  6:00 AM  Result Value Ref Range   Color, Urine YELLOW YELLOW   APPearance HAZY (A) CLEAR   Specific Gravity, Urine 1.019 1.005 - 1.030   pH 6.0 5.0 - 8.0   Glucose, UA NEGATIVE NEGATIVE mg/dL   Hgb urine dipstick NEGATIVE NEGATIVE   Bilirubin Urine NEGATIVE NEGATIVE   Ketones, ur 80 (A) NEGATIVE mg/dL   Protein, ur 884 (A) NEGATIVE mg/dL   Nitrite NEGATIVE NEGATIVE   Leukocytes,Ua NEGATIVE NEGATIVE   RBC / HPF 0-5 0 - 5 RBC/hpf    WBC, UA 11-20 0 - 5 WBC/hpf   Bacteria, UA FEW (A) NONE SEEN   Squamous Epithelial / LPF 11-20 0 - 5   Mucus PRESENT     --Patient without any episodes of emesis during day shift --+THC, discussed interventions for abstaining from use --New Rx Phenergan as first line, may alter to per rectum if unable to tolerate PO. Reserve Zofran for second line --Patient tolerating PO at time of discharge --Discharge home in stable condition  F/U: --[facility] 09/04/2019  Clayton Bibles, MSN, CNM Certified Nurse Midwife, [facility] for [employer], [facility] 08/21/19 12:01 PM

## 2019-08-21 NOTE — MAU Note (Signed)
Pt stated 'something is stuck in my throat' then preceded to stick her finger down her throat and force herself to vomit.  (Pt has very long fingernails). Advised pt not to do this as she could cause injury or irritate her throat more.

## 2019-08-21 NOTE — MAU Note (Signed)
Covid swab obtained without difficulty and pt tol well. 

## 2019-08-22 ENCOUNTER — Inpatient Hospital Stay (HOSPITAL_COMMUNITY): Payer: Medicaid Other

## 2019-08-22 ENCOUNTER — Encounter (HOSPITAL_COMMUNITY): Payer: Self-pay | Admitting: Obstetrics & Gynecology

## 2019-08-22 ENCOUNTER — Other Ambulatory Visit: Payer: Self-pay

## 2019-08-22 ENCOUNTER — Observation Stay (HOSPITAL_COMMUNITY)
Admission: AD | Admit: 2019-08-22 | Discharge: 2019-08-23 | DRG: 832 | Disposition: A | Discharge status: Home / Self Care | Payer: Medicaid Other | Attending: Obstetrics & Gynecology | Admitting: Obstetrics & Gynecology

## 2019-08-22 DIAGNOSIS — O99322 Drug use complicating pregnancy, second trimester: Secondary | ICD-10-CM | POA: Diagnosis present

## 2019-08-22 DIAGNOSIS — O99012 Anemia complicating pregnancy, second trimester: Secondary | ICD-10-CM | POA: Diagnosis present

## 2019-08-22 DIAGNOSIS — O99332 Smoking (tobacco) complicating pregnancy, second trimester: Secondary | ICD-10-CM | POA: Diagnosis present

## 2019-08-22 DIAGNOSIS — R569 Unspecified convulsions: Secondary | ICD-10-CM

## 2019-08-22 DIAGNOSIS — D573 Sickle-cell trait: Secondary | ICD-10-CM | POA: Diagnosis present

## 2019-08-22 DIAGNOSIS — Z3A22 22 weeks gestation of pregnancy: Secondary | ICD-10-CM

## 2019-08-22 DIAGNOSIS — I10 Essential (primary) hypertension: Secondary | ICD-10-CM | POA: Diagnosis not present

## 2019-08-22 DIAGNOSIS — O99342 Other mental disorders complicating pregnancy, second trimester: Principal | ICD-10-CM | POA: Diagnosis present

## 2019-08-22 DIAGNOSIS — R1111 Vomiting without nausea: Secondary | ICD-10-CM | POA: Diagnosis not present

## 2019-08-22 DIAGNOSIS — F445 Conversion disorder with seizures or convulsions: Secondary | ICD-10-CM | POA: Diagnosis present

## 2019-08-22 DIAGNOSIS — H0520 Unspecified exophthalmos: Secondary | ICD-10-CM | POA: Diagnosis present

## 2019-08-22 DIAGNOSIS — O210 Mild hyperemesis gravidarum: Secondary | ICD-10-CM

## 2019-08-22 DIAGNOSIS — R110 Nausea: Secondary | ICD-10-CM | POA: Diagnosis not present

## 2019-08-22 DIAGNOSIS — R457 State of emotional shock and stress, unspecified: Secondary | ICD-10-CM | POA: Diagnosis not present

## 2019-08-22 DIAGNOSIS — F1290 Cannabis use, unspecified, uncomplicated: Secondary | ICD-10-CM | POA: Diagnosis present

## 2019-08-22 DIAGNOSIS — Z20822 Contact with and (suspected) exposure to covid-19: Secondary | ICD-10-CM

## 2019-08-22 DIAGNOSIS — F17210 Nicotine dependence, cigarettes, uncomplicated: Secondary | ICD-10-CM | POA: Diagnosis present

## 2019-08-22 DIAGNOSIS — I959 Hypotension, unspecified: Secondary | ICD-10-CM | POA: Diagnosis not present

## 2019-08-22 HISTORY — DX: Mild hyperemesis gravidarum: O21.0

## 2019-08-22 LAB — CBC
HCT: 31 % — ABNORMAL LOW (ref 36.0–46.0)
Hemoglobin: 11.1 g/dL — ABNORMAL LOW (ref 12.0–15.0)
MCH: 32.2 pg (ref 26.0–34.0)
MCHC: 35.8 g/dL (ref 30.0–36.0)
MCV: 89.9 fL (ref 80.0–100.0)
Platelets: 201 10*3/uL (ref 150–400)
RBC: 3.45 MIL/uL — ABNORMAL LOW (ref 3.87–5.11)
RDW: 11.8 % (ref 11.5–15.5)
WBC: 14.4 10*3/uL — ABNORMAL HIGH (ref 4.0–10.5)
nRBC: 0 % (ref 0.0–0.2)

## 2019-08-22 LAB — PHOSPHORUS: Phosphorus: 2.6 mg/dL (ref 2.5–4.6)

## 2019-08-22 LAB — URINALYSIS, ROUTINE W REFLEX MICROSCOPIC
Bilirubin Urine: NEGATIVE
Glucose, UA: NEGATIVE mg/dL
Ketones, ur: 5 mg/dL — AB
Leukocytes,Ua: NEGATIVE
Nitrite: NEGATIVE
Protein, ur: NEGATIVE mg/dL
Specific Gravity, Urine: 1.008 (ref 1.005–1.030)
pH: 7 (ref 5.0–8.0)

## 2019-08-22 LAB — COMPREHENSIVE METABOLIC PANEL
ALT: 11 U/L (ref 0–44)
AST: 18 U/L (ref 15–41)
Albumin: 3.6 g/dL (ref 3.5–5.0)
Alkaline Phosphatase: 34 U/L — ABNORMAL LOW (ref 38–126)
Anion gap: 11 (ref 5–15)
BUN: 7 mg/dL (ref 6–20)
CO2: 21 mmol/L — ABNORMAL LOW (ref 22–32)
Calcium: 8.7 mg/dL — ABNORMAL LOW (ref 8.9–10.3)
Chloride: 105 mmol/L (ref 98–111)
Creatinine, Ser: 0.63 mg/dL (ref 0.44–1.00)
GFR calc Af Amer: >60 mL/min (ref >60)
GFR calc non Af Amer: >60 mL/min (ref >60)
Glucose, Bld: 113 mg/dL — ABNORMAL HIGH (ref 70–99)
Potassium: 3.1 mmol/L — ABNORMAL LOW (ref 3.5–5.1)
Sodium: 137 mmol/L (ref 135–145)
Total Bilirubin: 0.8 mg/dL (ref 0.3–1.2)
Total Protein: 6.7 g/dL (ref 6.5–8.1)

## 2019-08-22 LAB — MAGNESIUM: Magnesium: 1.8 mg/dL (ref 1.7–2.4)

## 2019-08-22 MED ORDER — DOCUSATE SODIUM 100 MG PO CAPS: 100.0000 mg | ORAL_CAPSULE | Freq: Every day | ORAL | Status: DC

## 2019-08-22 MED ORDER — LACTATED RINGERS IV BOLUS
1000.0000 mL | Freq: Once | INTRAVENOUS | Status: AC
  Administered 2019-08-22: 10:00:00 1000 mL via INTRAVENOUS

## 2019-08-22 MED ORDER — CALCIUM CARBONATE ANTACID 500 MG PO CHEW
2.0000 | CHEWABLE_TABLET | ORAL | Status: DC | PRN
  Administered 2019-08-23: 400 mg via ORAL
  Filled 2019-08-22: qty 2

## 2019-08-22 MED ORDER — ASPIRIN 81 MG PO CHEW: 81.0000 mg | CHEWABLE_TABLET | Freq: Every day | ORAL | Status: DC

## 2019-08-22 MED ORDER — LIDOCAINE VISCOUS HCL 2 % MT SOLN
15.0000 mL | Freq: Once | OROMUCOSAL | Status: AC
  Administered 2019-08-22: 15 mL via ORAL
  Filled 2019-08-22: qty 15

## 2019-08-22 MED ORDER — THIAMINE HCL 100 MG PO TABS
100.0000 mg | ORAL_TABLET | Freq: Every day | ORAL | Status: DC
  Administered 2019-08-22: 100 mg via ORAL
  Filled 2019-08-22 (×2): qty 1

## 2019-08-22 MED ORDER — ONDANSETRON 4 MG PO TBDP: 4.0000 mg | ORAL_TABLET | Freq: Four times a day (QID) | ORAL | Status: DC | PRN

## 2019-08-22 MED ORDER — ZOLPIDEM TARTRATE 5 MG PO TABS
5.0000 mg | ORAL_TABLET | Freq: Every evening | ORAL | Status: DC | PRN
  Administered 2019-08-23: 5 mg via ORAL
  Filled 2019-08-22: qty 1

## 2019-08-22 MED ORDER — PRENATAL MULTIVITAMIN CH: 1.0000 | ORAL_TABLET | Freq: Every day | ORAL | Status: DC

## 2019-08-22 MED ORDER — PROMETHAZINE HCL 25 MG PO TABS
25.0000 mg | ORAL_TABLET | Freq: Four times a day (QID) | ORAL | Status: DC | PRN
  Administered 2019-08-22 – 2019-08-23 (×2): 25 mg via ORAL
  Filled 2019-08-22 (×2): qty 1

## 2019-08-22 MED ORDER — ACETAMINOPHEN 325 MG PO TABS: 650.0000 mg | ORAL_TABLET | ORAL | Status: DC | PRN

## 2019-08-22 MED ORDER — SODIUM CHLORIDE 0.9 % IV SOLN
25.0000 mg | Freq: Once | INTRAVENOUS | Status: DC
  Filled 2019-08-22: qty 1

## 2019-08-22 MED ORDER — ALUM & MAG HYDROXIDE-SIMETH 200-200-20 MG/5ML PO SUSP
30.0000 mL | Freq: Once | ORAL | Status: AC
  Administered 2019-08-22: 11:00:00 30 mL via ORAL
  Filled 2019-08-22: qty 30

## 2019-08-22 MED ORDER — LACTATED RINGERS IV SOLN: INTRAVENOUS | Status: DC

## 2019-08-22 MED ORDER — SODIUM CHLORIDE 0.9 % IV SOLN
8.0000 mg | INTRAVENOUS | Status: AC
  Administered 2019-08-22: 10:00:00 8 mg via INTRAVENOUS
  Filled 2019-08-22: qty 4

## 2019-08-22 MED ORDER — LORAZEPAM 2 MG/ML IJ SOLN
1.0000 mg | Freq: Four times a day (QID) | INTRAMUSCULAR | Status: DC | PRN
  Administered 2019-08-22 – 2019-08-23 (×2): 1 mg via INTRAVENOUS
  Filled 2019-08-22 (×2): qty 1

## 2019-08-22 NOTE — Consult Note (Signed)
Neurology Consultation  Reason for Consult: seizure Referring Physician: Dr. Roselie Awkward - OBGYN  CC: seizure  History is obtained from: patient and chart  HPI: Leslie Yates is a 29 y.o. female past medical history of asthma, obesity, sickle cell trait, marijuana abuse, tobacco abuse who is now [redacted] weeks pregnant, was noted to have had witnessed seizure for which neurology was consulted. The patient was complaining of feeling unwell over the past few days.  She had an episode of unresponsiveness at home and was brought in for evaluation of the MAU.  EMS reported that she was having shaking all over the body which stopped when they asked her to stop.  She says she remembers having shaking all over the body. She was evaluated in the MAU and had a witnessed seizure here that lasted about 5 minutes.  It was reported that she had her right arm clenched during this time.  She says she remembers some of it but not all of it. No bladder or bowel incontinence.  No tongue bite. 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  She says she has not smoked in 2 days. No history of seizures prior to this.  No family history of seizures. 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 Currently reports a lot of psychosocial stressors including trouble with the father of her unborn child who she reports is married and she is stressed about the situation.  Reports sore throat, vomiting and nausea at this time.  Denies headache.  Denies visual changes.  Denies tingling numbness weakness.  Reports abdominal pain.  ROS: ROS was performed and is negative except as noted in the HPI.    Past Medical History:  Diagnosis Date  . Asthma   . Obesity   . Ovarian cyst   . Sickle cell trait (HCC)     Family History  Problem Relation Age of Onset  . Diabetes Mother   . Hypertension Mother   . Diabetes Father   . Hypertension  Father    Social History:   reports that she has been smoking cigarettes. She has a 2.75 pack-year smoking history. She has never used smokeless tobacco. She reports previous alcohol use. She reports previous drug use. Drugs: Benzodiazepines and Marijuana.  Medications No current facility-administered medications for this encounter.   Exam: Current vital signs: BP 137/74 (BP Location: Left Arm)   Pulse (!) 56   Temp 98.4 F (36.9 C) (Oral)   Resp 16   LMP 02/28/2019 (Approximate) Comment: neg preg test 10/27/2018  SpO2 100%  Vital signs in last 24 hours: Temp:  [98.4 F (36.9 C)] 98.4 F (36.9 C) (02/27 0921) Pulse Rate:  [56] 56 (02/27 0921) Resp:  [16] 16 (02/27 0921) BP: (137)/(74) 137/74 (02/27 0921) SpO2:  [100 %] 100 % (02/27 0921) General: Awake alert, appears to be in some distress due to abdominal discomfort and nausea. HEENT: Normocephalic atraumatic Lungs: Wheezing Abdomen: Distended nontender Extremities with no edema Neurological exam Awake alert oriented x3 Voice is hypophonic but there is no evidence of dysarthria or aphasia.  She has normal attention concentration. Cranial nerves: Pupils equal round react light, extraocular movements intact, visual fields are full, face is symmetric, tongue and palate midline. Motor exam: Upper extremities 5/5 bilaterally without drift.  Lower extremities exam is slightly limited by pain when trying to lift both the legs due to some abdominal discomfort but  she does not have any focal weakness. Sensory exam intact to light touch Coordination: No ataxia Gait testing deferred at this time  Labs I have reviewed labs in epic and the results pertinent to this consultation are: CBC    Component Value Date/Time   WBC 14.4 (H) 08/22/2019 0945   RBC 3.45 (L) 08/22/2019 0945   HGB 11.1 (L) 08/22/2019 0945   HGB 11.9 06/12/2019 1029   HCT 31.0 (L) 08/22/2019 0945   HCT 35.0 06/12/2019 1029   PLT 201 08/22/2019 0945   PLT 235  06/12/2019 1029   MCV 89.9 08/22/2019 0945   MCV 89 06/12/2019 1029   MCH 32.2 08/22/2019 0945   MCHC 35.8 08/22/2019 0945   RDW 11.8 08/22/2019 0945   RDW 12.7 06/12/2019 1029   LYMPHSABS 1.1 08/21/2019 0430   LYMPHSABS 1.4 06/12/2019 1029   MONOABS 0.9 08/21/2019 0430   EOSABS 0.0 08/21/2019 0430   EOSABS 0.1 06/12/2019 1029   BASOSABS 0.0 08/21/2019 0430   BASOSABS 0.0 06/12/2019 1029   CMP     Component Value Date/Time   NA 137 08/22/2019 0945   NA 144 08/19/2018 1624   K 3.1 (L) 08/22/2019 0945   CL 105 08/22/2019 0945   CO2 21 (L) 08/22/2019 0945   GLUCOSE 113 (H) 08/22/2019 0945   BUN 7 08/22/2019 0945   BUN 8 08/19/2018 1624   CREATININE 0.63 08/22/2019 0945   CALCIUM 8.7 (L) 08/22/2019 0945   PROT 6.7 08/22/2019 0945   PROT 5.6 (L) 08/19/2018 1624   ALBUMIN 3.6 08/22/2019 0945   ALBUMIN 3.8 (L) 08/19/2018 1624   AST 18 08/22/2019 0945   ALT 11 08/22/2019 0945   ALKPHOS 34 (L) 08/22/2019 0945   BILITOT 0.8 08/22/2019 0945   BILITOT 0.3 08/19/2018 1624   GFRNONAA >60 08/22/2019 0945   GFRAA >60 08/22/2019 0945    Imaging I have reviewed the images obtained: No imaging to review  Assessment: 29 year old with past medical history of asthma, obesity, sickle cell trait, marijuana abuse, tobacco abuse, cyclical vomiting who is now [redacted] weeks pregnant and was noted to have witnessed seizure in the MAU. She came in for concern for seizure activity-description from EMS was suspicious for a nonepileptic event but she had another episode in the EMU witnessed by the OB/GYN team-neurology was consulted for this. On my examination the patient was very anxious looking, somewhat short of breath with no other focal deficits. Report of stressors, anxious and shortness of breath on exam should also make psychogenic nonepileptic events in the differentials.  That said, it is prudent to do work-up for first-time seizure but not start any antiepileptics unless these tests show any  abnormality.  Impression: Witnessed seizure-evaluate for first-time seizure Less likely to be eclampsia-blood pressures have been normal.  Recommendations: MRI brain without contrast EEG Check mag, Phos, regular labs UA unremarkable Will follow after tests Plan relayed to the primary team APP at the floor  -- Milon Dikes, MD [facility] Pager: [phone] If 7pm to 7am, please call on call as listed on AMION.

## 2019-08-22 NOTE — Procedures (Signed)
Patient Name: Leslie Yates  MRN: [id number]  Epilepsy Attending: Charlsie Quest  Referring Physician/Provider: Dr Milon Dikes Date: 08/22/2019 Duration: 24.01 mins  Patient history: 29 year old, [redacted] weeks pregnant and was noted to have witnessed seizure in the MAU. EEG to evaluate for seizure.  Level of alertness:  Awake, asleep  AEDs during EEG study: None  Technical aspects: This EEG study was done with scalp electrodes positioned according to the 10-20 International system of electrode placement. Electrical activity was acquired at a sampling rate of 500Hz  and reviewed with a high frequency filter of 70Hz  and a low frequency filter of 1Hz . EEG data were recorded continuously and digitally stored.   DESCRIPTION:  The posterior dominant rhythm consists of 9-10 Hz activity of moderate voltage (25-35 uV) seen predominantly in posterior head regions, symmetric and reactive to eye opening and eye closing.Sleep was characterized by vertex waves, sleep spindles (12-14hz ), maximal frontocentral. Hyperventilation and photic stimulation were not performed.  IMPRESSION: This study is within normal limits. No seizures or epileptiform discharges were seen throughout the recording.  Abeni Finchum 

## 2019-08-22 NOTE — Progress Notes (Signed)
EEG complete - results pending 

## 2019-08-22 NOTE — Progress Notes (Signed)
Pt to MRI

## 2019-08-22 NOTE — MAU Note (Signed)
Pt reports to mau via ems with c/o vomiting and abd pain.  Pt reports emesis X4 today.  Pt denies vag bleeding or dc.  Denies LOF

## 2019-08-22 NOTE — Progress Notes (Signed)
Pt states she vomited up previously given phenergan.  She continues to insist that she have something to eat (pt has been educated on adv as tolerated diet d/t current nausea); given saltines.

## 2019-08-22 NOTE — H&P (Addendum)
ANTEPARTUM ADMISSION HISTORY AND PHYSICAL NOTE   History of Present Illness: Leslie Yates is a 29 y.o. G1P0000 at 72w2dadmitted for observation after witnessed seizures in MAU after arriving with complaints of on-going nausea and vomiting. She arrived by EMS.   She denies vaginal bleeding, abdominal cramping, dysuria, contractions.   She has been seen in MAU 4 times in February for similar complaints. Last visit was yesterday. She was sent home with antiemetics and a scopalamine patch; the scopalamine patch has fallen off and she "does not know what happened to it". Review of chart shows that patient was sent home yesterday with phenergan, which she has taken once. She did not take her medicine again.  Review of visit yesterday shows that patient was observed sticking finger down throat to vomit and also drinking liquids when not advised.   She denies using marijuana in the past two days. Before that she admits to using at least 5 times per day. She smokes about a quarter of a pack of cigarettes a day.    While in MAU, CBC and CMP were drawn, as well as  IV with Zofran IV piggy back; hold off on phenergan due to patient's drowsiness.   1140: patient cried out for "help"; RN and CNM at bedside and patient was observed with rhythmic full-body jerking and clawed hands.  Side rails immediately raised and patient turned on her right side. Patient able to respond in low voice that her chest feels "tight". Her vitals were stable; Doppler was 151 and Dr. ARoselie Awkwardand anesthesia at the bedside with neuro consult on the phone. Per neuro, do not administer any IV meds.  After 5 minutes patient's jerking stopped and she was able to answer questions: No history of seizure disorder and cannot explain what precipitated the event. Brow appears clammy.  Of note, EMS reports that patient had similar "shaking" when she was picked up but EMS was able to tell her to "stop" and she calmed down, later saying that  she thinks she had a panic attack.   Dr. ARory Percyfrom neurology planning to see patient at earliest opportunity. Recommends holding off on Versed at this time.   EKG showed prolonged QT interval; review by Dr. ARory Percyand finds EKG nonconcerning.   CMP is stable with Potassium at 3.1; WBC is 14. Calcium is 8.7.     Patient Active Problem List   Diagnosis Date Noted  . Hyperemesis gravidarum 08/22/2019  . 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 Carrier of disease 07/01/2019  . [place]   . Obesity in pregnancy, antepartum 06/12/2019  . Lump of breast 06/12/2019  . Supervision of normal first pregnancy, antepartum 06/01/2019  . Tobacco use 10/28/2018  . Cyclical vomiting with nausea 10/27/2018  . Asthma 10/27/2018  . Complex cyst of right ovary 05/10/2017    Past Medical History:  Diagnosis Date  . Asthma   . Obesity   . Ovarian cyst   . Sickle cell trait ([facility]     Past Surgical History:  Procedure Laterality Date  . INCISION AND DRAINAGE ABSCESS      OB History  Gravida Para Term Preterm AB Living  1 0 0 0 0    SAB TAB Ectopic Multiple Live Births  0 0 0        # Outcome Date GA Lbr Len/2nd Weight Sex Delivery Anes PTL Lv  1 Current  Social History   Socioeconomic History  . Marital status: Single    Spouse name: Not on file  . Number of children: Not on file  . Years of education: Not on file  . Highest education level: Some college, no degree  Occupational History  . Not on file  Tobacco Use  . Smoking status: Current Every Day Smoker    Packs/day: 0.25    Years: 11.00    Pack years: 2.75    Types: Cigarettes  . Smokeless tobacco: Never Used  Substance and Sexual Activity  . Alcohol use: Not Currently    Comment: occ  . Drug use: Not Currently    Types: Benzodiazepines, Marijuana    Comment: States she never fill prescription, last time marijuana use yesterday  . Sexual activity:  Yes    Birth control/protection: None  Other Topics Concern  . Not on file  Social History Narrative   ** Merged History Encounter **       ** Merged History Encounter ** Patient is having trouble with financial issues.   Social Determinants of Health   Financial Resource Strain: High Risk  . Difficulty of Paying Living Expenses: Hard  Food Insecurity: Food Insecurity Present  . Worried About Charity fundraiser in the Last Year: Sometimes true  . Ran Out of Food in the Last Year: Sometimes true  Transportation Needs: No Transportation Needs  . Lack of Transportation (Medical): No  . Lack of Transportation (Non-Medical): No  Physical Activity: Inactive  . Days of Exercise per Week: 0 days  . Minutes of Exercise per Session: 0 min  Stress: No Stress Concern Present  . Feeling of Stress : Not at all  Social Connections: Moderately Isolated  . Frequency of Communication with Friends and Family: More than three times a week  . Frequency of Social Gatherings with Friends and Family: More than three times a week  . Attends Religious Services: Never  . Active Member of Clubs or Organizations: No  . Attends Archivist Meetings: Never  . Marital Status: Never married    Family History  Problem Relation Age of Onset  . Diabetes Mother   . Hypertension Mother   . Diabetes Father   . Hypertension Father     Allergies  Allergen Reactions  . Penicillins Rash    Childhood rash Has patient had a PCN reaction causing immediate rash, facial/tongue/throat swelling, SOB or lightheadedness with hypotension: no Has patient had a PCN reaction causing severe rash involving mucus membranes or skin necrosis:no Has patient had a PCN reaction that required hospitalization no Has patient had a PCN reaction occurring within the last 10 years: no If all of the above answers are "NO", then may proceed with Cephalosporin use.     Medications Prior to Admission  Medication Sig Dispense  Refill Last Dose  . acetaminophen (TYLENOL) 325 MG tablet Take 650 mg by mouth every 6 (six) hours as needed for mild pain or headache.     Marland Kitchen aspirin 81 MG chewable tablet Chew 1 tablet (81 mg total) by mouth daily. 60 tablet 1   . Blood Pressure Monitoring (BLOOD PRESSURE MONITOR AUTOMAT) DEVI 1 Device by Does not apply route daily. Automatic blood pressure cuff regular/x-large size cuff. To monitor blood pressure regularly at home. ICD-10 code: O09.90. 1 each 0   . Misc. Devices (GOJJI WEIGHT SCALE) MISC 1 each by Does not apply route daily as needed. Patient to weight daily as needed. ICD-10 code:  O09.90 1 each 0   . ondansetron (ZOFRAN ODT) 4 MG disintegrating tablet Take 1-2 tablets (4-8 mg total) by mouth every 6 (six) hours as needed for nausea or vomiting. For nausea and vomiting not relieved by Phenergan 30 tablet 1   . promethazine (PHENERGAN) 25 MG tablet Take 1 tablet (25 mg total) by mouth every 6 (six) hours as needed for nausea or vomiting. 30 tablet 0     Review of Systems - General ROS: positive for  - altered mental status.   Vitals:  BP 134/70 (BP Location: Left Arm)   Pulse (!) 56   Temp 98.3 F (36.8 C) (Oral)   Resp 18   Ht 6' (1.829 m)   Wt 117.9 kg   LMP 02/28/2019 (Approximate) Comment: neg preg test 10/27/2018  SpO2 100%   BMI 35.26 kg/m  Physical Examination: CONSTITUTIONAL: Well-developed, well-nourished female in no acute distress.  HENT:  Normocephalic, atraumatic, External right and left ear normal. Oropharynx is clear and moist EYES: Conjunctivae and EOM are normal. Pupils are equal, round, and reactive to light. No scleral icterus.  NECK: Normal range of motion, supple, no masses SKIN: Skin is warm and dry. No rash noted. Not diaphoretic. No erythema. No pallor. [place]: Alert and oriented to person, place, and time. Normal reflexes, muscle tone coordination. No cranial nerve deficit noted. PSYCHIATRIC: Normal mood and affect. Normal behavior. Normal  judgment and thought content. CARDIOVASCULAR: Normal heart rate noted, regular rhythm RESPIRATORY: Effort and breath sounds normal, no problems with respiration noted ABDOMEN: Soft, nontender, nondistended, gravid. MUSCULOSKELETAL: Normal range of motion. No edema and no tenderness. 2+ distal pulses.  Labs:  Results for orders placed or performed during the hospital encounter of 08/22/19 (from the past 24 hour(s))  Urinalysis, Routine w reflex microscopic   Collection Time: 08/22/19  9:40 AM  Result Value Ref Range   Color, Urine YELLOW YELLOW   APPearance CLEAR CLEAR   Specific Gravity, Urine 1.008 1.005 - 1.030   pH 7.0 5.0 - 8.0   Glucose, UA NEGATIVE NEGATIVE mg/dL   Hgb urine dipstick SMALL (A) NEGATIVE   Bilirubin Urine NEGATIVE NEGATIVE   Ketones, ur 5 (A) NEGATIVE mg/dL   Protein, ur NEGATIVE NEGATIVE mg/dL   Nitrite NEGATIVE NEGATIVE   Leukocytes,Ua NEGATIVE NEGATIVE   RBC / HPF 0-5 0 - 5 RBC/hpf   WBC, UA 0-5 0 - 5 WBC/hpf   Bacteria, UA RARE (A) NONE SEEN   Squamous Epithelial / LPF 0-5 0 - 5   Mucus PRESENT   CBC   Collection Time: 08/22/19  9:45 AM  Result Value Ref Range   WBC 14.4 (H) 4.0 - 10.5 K/uL   RBC 3.45 (L) 3.87 - 5.11 MIL/uL   Hemoglobin 11.1 (L) 12.0 - 15.0 g/dL   HCT 31.0 (L) 36.0 - 46.0 %   MCV 89.9 80.0 - 100.0 fL   MCH 32.2 26.0 - 34.0 pg   MCHC 35.8 30.0 - 36.0 g/dL   RDW 11.8 11.5 - 15.5 %   Platelets 201 150 - 400 K/uL   nRBC 0.0 0.0 - 0.2 %  Comprehensive metabolic panel   Collection Time: 08/22/19  9:45 AM  Result Value Ref Range   Sodium 137 135 - 145 mmol/L   Potassium 3.1 (L) 3.5 - 5.1 mmol/L   Chloride 105 98 - 111 mmol/L   CO2 21 (L) 22 - 32 mmol/L   Glucose, Bld 113 (H) 70 - 99 mg/dL   BUN 7 6 -  20 mg/dL   Creatinine, Ser 0.63 0.44 - 1.00 mg/dL   Calcium 8.7 (L) 8.9 - 10.3 mg/dL   Total Protein 6.7 6.5 - 8.1 g/dL   Albumin 3.6 3.5 - 5.0 g/dL   AST 18 15 - 41 U/L   ALT 11 0 - 44 U/L   Alkaline Phosphatase 34 (L) 38 - 126  U/L   Total Bilirubin 0.8 0.3 - 1.2 mg/dL   GFR calc non Af Amer >60 >60 mL/min   GFR calc Af Amer >60 >60 mL/min   Anion gap 11 5 - 15  Magnesium   Collection Time: 08/22/19  9:45 AM  Result Value Ref Range   Magnesium 1.8 1.7 - 2.4 mg/dL  Phosphorus   Collection Time: 08/22/19  9:45 AM  Result Value Ref Range   Phosphorus 2.6 2.5 - 4.6 mg/dL    Imaging Studies: [place] MFM OB DETAIL +14 WK  Result Date: 07/31/2019 ----------------------------------------------------------------------  OBSTETRICS REPORT                       (Signed Final 07/31/2019 09:18 am) ---------------------------------------------------------------------- Patient Info  ID #:       [id number]                          D.O.B.:  09/15/1990 (28 yrs)  Name:       GIULIA HICKEY [place]                   Visit Date: 07/30/2019 01:39 pm ---------------------------------------------------------------------- Performed By  Performed By:     Rodrigo Ran BS      Referred By:      [facility]                    RDMS RVT  Attending:        Tama High MD        [facility] ---------------------------------------------------------------------- Orders   #  Description                          Code         Ordered By   1  [place] MFM OB DETAIL +14 WK              76811.01     Marcille Buffy  ----------------------------------------------------------------------   #  Order #                    Accession #                 Episode #   1  [id number]                  [id number]                  [id number]  ---------------------------------------------------------------------- Indications   Antenatal screening for malformations          Z36.3   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SMA, [place] - had GC)  Z14.8   Tobacco use complicating pregnancy,            O99.332   second trimester (1/2 pk per day)   Obesity complicating pregnancy, second         O99.212   trimester   [redacted] weeks  gestation of pregnancy                Z3A.19  ---------------------------------------------------------------------- Fetal Evaluation  Num Of Fetuses:         1  Fetal Heart Rate(bpm):  138  Cardiac Activity:       Observed  Presentation:           Cephalic  Placenta:               Anterior  P. Cord Insertion:      Visualized  Amniotic Fluid  AFI FV:      Within normal limits                              Largest Pocket(cm)                              3.8 ---------------------------------------------------------------------- Biometry  BPD:      43.6  mm     G. Age:  19w 1d         59  %    CI:        74.69   %    70 - 86                                                          FL/HC:      17.6   %    16.1 - 18.3  HC:      160.1  mm     G. Age:  18w 6d         34  %    HC/AC:      1.18        1.09 - 1.39  AC:      135.4  mm     G. Age:  19w 0d         46  %    FL/BPD:     64.4   %  FL:       28.1  mm     G. Age:  18w 4d         29  %    FL/AC:      20.8   %    20 - 24  HUM:      25.8  mm     G. Age:  18w 0d         25  %  CER:        19  mm     G. Age:  18w 4d         35  %  NFT:       2.2  mm  CM:        3.3  mm  Est. FW:     260  gm      0 lb 9 oz     36  % ---------------------------------------------------------------------- OB History  Gravidity:    1         Term:   0        Prem:   0        SAB:   0  TOP:          0       Ectopic:  0        Living: 0 ---------------------------------------------------------------------- Gestational Age  LMP:           20w 2d        Date:  03/10/19                 EDD:   12/15/19  U/S Today:     18w 6d                                        EDD:   12/25/19  Best:          19w 0d     Det. By:  Loman Chroman         EDD:   12/24/19                                      (06/09/19) ---------------------------------------------------------------------- Anatomy  Cranium:               Appears normal         Aortic Arch:            Appears normal  Cavum:                 Appears  normal         Ductal Arch:            Appears normal  Ventricles:            Appears normal         Diaphragm:              Appears normal  Choroid Plexus:        Appears normal         Stomach:                Appears normal, left                                                                        sided  Cerebellum:            Appears normal         Abdomen:                Appears normal  Posterior Fossa:       Appears normal         Abdominal Wall:         Appears nml (cord  insert, abd wall)  Nuchal Fold:           Appears normal         Cord Vessels:           Appears normal (3                                                                        vessel cord)  Face:                  Appears normal         Kidneys:                Appear normal                         (orbits and profile)  Lips:                  Appears normal         Bladder:                Appears normal  Thoracic:              Appears normal         Spine:                  Appears normal  Heart:                 Appears normal         Upper Extremities:      Appears normal                         (4CH, axis, and                         situs)  RVOT:                  Appears normal         Lower Extremities:      Appears normal  LVOT:                  Appears normal  Other:  Heels/feet and open hands/5th digits visualized. Nasal bone          visualized. ---------------------------------------------------------------------- Cervix Uterus Adnexa  Cervix  Length:           3.66  cm.  Normal appearance by transabdominal scan.  Uterus  No abnormality visualized.  Left Ovary  Within normal limits.  Right Ovary  Known admexal/ovarian cyst, measuring 8.2 x 5.4 x 4.4 cm.  Cul De Sac  No free fluid seen. ---------------------------------------------------------------------- Impression  We performed fetal anatomy scan. No makers of  aneuploidies or fetal structural defects are seen.  Fetal  biometry is consistent with her previously-established dates.  Amniotic fluid is normal and good fetal activity is seen.  Patient understands the limitations of ultrasound in detecting  fetal anomalies.  On cell-free fetal DNA screening, the risks of fetal  aneuploidies are not increased.  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  She had met with our genetic counselor (see  separate note). ----------------------------------------------------------------------                  Tama High, MD Electronically Signed Final Report   07/31/2019 09:18 am ----------------------------------------------------------------------    Assessment and Plan: Patient Active Problem List   Diagnosis Date Noted  . Hyperemesis gravidarum 08/22/2019  . 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 Carrier of disease 07/01/2019  . Sickle cell trait ([place])   . Obesity in pregnancy, antepartum 06/12/2019  . Lump of breast 06/12/2019  . Supervision of normal first pregnancy, antepartum 06/01/2019  . Tobacco use 10/28/2018  . Cyclical vomiting with nausea 10/27/2018  . Asthma 10/27/2018  . Complex cyst of right ovary 05/10/2017   -Discussed with Dr. Roselie Awkward and Dr. Rory Percy, will admit to [facility] and observe -Patient to have MRI of brain and EEG, orders placed by Dr. Rory Percy.  -Will give 1 mg of ativan prior to MRI -check magnesium and phosphorus -check FH tones q shift.  -initiate telemetry -antiemetics for nausea and vomiting.  -SARS-2 test on 2/26 negative    [place], [place], [place]

## 2019-08-23 DIAGNOSIS — F445 Conversion disorder with seizures or convulsions: Secondary | ICD-10-CM | POA: Diagnosis not present

## 2019-08-23 DIAGNOSIS — O99352 Diseases of the nervous system complicating pregnancy, second trimester: Secondary | ICD-10-CM | POA: Diagnosis not present

## 2019-08-23 DIAGNOSIS — O210 Mild hyperemesis gravidarum: Secondary | ICD-10-CM

## 2019-08-23 DIAGNOSIS — Z3A22 22 weeks gestation of pregnancy: Secondary | ICD-10-CM | POA: Diagnosis not present

## 2019-08-23 NOTE — Discharge Summary (Signed)
Patient ID: Leslie Yates MRN: [id number] DOB/AGE: 02-Jul-1990 29 y.o.  Admit date: 08/22/2019 Discharge date: 08/23/2019  Admission Diagnoses:Hypermesis gravidarum, seizure  Discharge Diagnoses: Pseudoseizure  Prenatal Procedures: none  Consults: Neurology  Hospital Course:  This is a 29 y.o. G1P0000 with IUP at [redacted]w[redacted]d admitted for nausea and vomiting and suspected seizure. She has chronic MJ use and cyclic vomiting. She had a possible seizure at home that was observed in [facility]. She could respond to questions during the episode. She received not antiseizure medication. MRI and EEG were normal  She was seen by Neurology during her stay.  She was observed, fetal heart rate monitoring remained reassuring, and she had no signs/symptoms of progressing preterm labor or other maternal-fetal concerns. She was deemed stable for discharge to home with outpatient follow up.  Discharge Exam: Temp:  [98.3 F (36.8 C)-98.6 F (37 C)] 98.4 F (36.9 C) (02/28 0411) Pulse Rate:  [56-70] 59 (02/28 0411) Resp:  [16-20] 20 (02/28 0411) BP: (134-150)/(56-82) 134/82 (02/28 0411) SpO2:  [98 %-100 %] 100 % (02/28 0411) Weight:  [117.9 kg] 117.9 kg (02/27 1358) Physical Examination: CONSTITUTIONAL: Well-developed, well-nourished female in no acute distress.  HENT:  Normocephalic, atraumatic, External right and left ear normal. Oropharynx is clear and moist EYES: Conjunctivae and EOM are normal. Pupils are equal, round, and reactive to light. No scleral icterus.  NECK: Normal range of motion, supple, no masses SKIN: Skin is warm and dry. No rash noted. Not diaphoretic. No erythema. No pallor. NEUROLGIC: Alert and oriented to person, place, and time. Normal reflexes, muscle tone coordination. No cranial nerve deficit noted. PSYCHIATRIC: Normal mood and affect. Normal behavior. Normal judgment and thought content. CARDIOVASCULAR: Normal heart rate noted, regular rhythm RESPIRATORY: Effort and breath sounds  normal, no problems with respiration noted MUSCULOSKELETAL: Normal range of motion. No edema and no tenderness. 2+ distal pulses. ABDOMEN: Soft, nontender, nondistended, gravid. CERVIX:    Fetal Heart Rate A  Mode Doppler filed at 08/23/2019 0425  Baseline Rate (A) 142 bpm filed at 08/23/2019 0425     Significant Diagnostic Studies:  Results for orders placed or performed during the hospital encounter of 08/22/19 (from the past 168 hour(s))  Urinalysis, Routine w reflex microscopic   Collection Time: 08/22/19  9:40 AM  Result Value Ref Range   Color, Urine YELLOW YELLOW   APPearance CLEAR CLEAR   Specific Gravity, Urine 1.008 1.005 - 1.030   pH 7.0 5.0 - 8.0   Glucose, UA NEGATIVE NEGATIVE mg/dL   Hgb urine dipstick SMALL (A) NEGATIVE   Bilirubin Urine NEGATIVE NEGATIVE   Ketones, ur 5 (A) NEGATIVE mg/dL   Protein, ur NEGATIVE NEGATIVE mg/dL   Nitrite NEGATIVE NEGATIVE   Leukocytes,Ua NEGATIVE NEGATIVE   RBC / HPF 0-5 0 - 5 RBC/hpf   WBC, UA 0-5 0 - 5 WBC/hpf   Bacteria, UA RARE (A) NONE SEEN   Squamous Epithelial / LPF 0-5 0 - 5   Mucus PRESENT   CBC   Collection Time: 08/22/19  9:45 AM  Result Value Ref Range   WBC 14.4 (H) 4.0 - 10.5 K/uL   RBC 3.45 (L) 3.87 - 5.11 MIL/uL   Hemoglobin 11.1 (L) 12.0 - 15.0 g/dL   HCT 56.4 (L) 33.2 - 95.1 %   MCV 89.9 80.0 - 100.0 fL   MCH 32.2 26.0 - 34.0 pg   MCHC 35.8 30.0 - 36.0 g/dL   RDW 88.4 16.6 - 06.3 %   Platelets 201 150 - 400  K/uL   nRBC 0.0 0.0 - 0.2 %  Comprehensive metabolic panel   Collection Time: 08/22/19  9:45 AM  Result Value Ref Range   Sodium 137 135 - 145 mmol/L   Potassium 3.1 (L) 3.5 - 5.1 mmol/L   Chloride 105 98 - 111 mmol/L   CO2 21 (L) 22 - 32 mmol/L   Glucose, Bld 113 (H) 70 - 99 mg/dL   BUN 7 6 - 20 mg/dL   Creatinine, Ser 0.35 0.44 - 1.00 mg/dL   Calcium 8.7 (L) 8.9 - 10.3 mg/dL   Total Protein 6.7 6.5 - 8.1 g/dL   Albumin 3.6 3.5 - 5.0 g/dL   AST 18 15 - 41 U/L   ALT 11 0 - 44 U/L    Alkaline Phosphatase 34 (L) 38 - 126 U/L   Total Bilirubin 0.8 0.3 - 1.2 mg/dL   GFR calc non Af Amer >60 >60 mL/min   GFR calc Af Amer >60 >60 mL/min   Anion gap 11 5 - 15  Magnesium   Collection Time: 08/22/19  9:45 AM  Result Value Ref Range   Magnesium 1.8 1.7 - 2.4 mg/dL  Phosphorus   Collection Time: 08/22/19  9:45 AM  Result Value Ref Range   Phosphorus 2.6 2.5 - 4.6 mg/dL  Results for orders placed or performed during the hospital encounter of 08/21/19 (from the past 168 hour(s))  SARS CORONAVIRUS 2 (TAT 6-24 HRS) Nasopharyngeal Nasopharyngeal Swab   Collection Time: 08/21/19  4:25 AM   Specimen: Nasopharyngeal Swab  Result Value Ref Range   SARS Coronavirus 2 NEGATIVE NEGATIVE  CBC with Differential/Platelet   Collection Time: 08/21/19  4:30 AM  Result Value Ref Range   WBC 17.0 (H) 4.0 - 10.5 K/uL   RBC 3.45 (L) 3.87 - 5.11 MIL/uL   Hemoglobin 11.0 (L) 12.0 - 15.0 g/dL   HCT 46.5 (L) 68.1 - 27.5 %   MCV 90.4 80.0 - 100.0 fL   MCH 31.9 26.0 - 34.0 pg   MCHC 35.3 30.0 - 36.0 g/dL   RDW 17.0 01.7 - 49.4 %   Platelets 209 150 - 400 K/uL   nRBC 0.0 0.0 - 0.2 %   Neutrophils Relative % 88 %   Neutro Abs 15.0 (H) 1.7 - 7.7 K/uL   Lymphocytes Relative 6 %   Lymphs Abs 1.1 0.7 - 4.0 K/uL   Monocytes Relative 5 %   Monocytes Absolute 0.9 0.1 - 1.0 K/uL   Eosinophils Relative 0 %   Eosinophils Absolute 0.0 0.0 - 0.5 K/uL   Basophils Relative 0 %   Basophils Absolute 0.0 0.0 - 0.1 K/uL   Immature Granulocytes 1 %   Abs Immature Granulocytes 0.11 (H) 0.00 - 0.07 K/uL  Comprehensive metabolic panel   Collection Time: 08/21/19  4:30 AM  Result Value Ref Range   Sodium 142 135 - 145 mmol/L   Potassium 3.1 (L) 3.5 - 5.1 mmol/L   Chloride 111 98 - 111 mmol/L   CO2 19 (L) 22 - 32 mmol/L   Glucose, Bld 124 (H) 70 - 99 mg/dL   BUN 7 6 - 20 mg/dL   Creatinine, Ser 4.96 0.44 - 1.00 mg/dL   Calcium 9.0 8.9 - 75.9 mg/dL   Total Protein 6.5 6.5 - 8.1 g/dL   Albumin 3.5 3.5 -  5.0 g/dL   AST 16 15 - 41 U/L   ALT 10 0 - 44 U/L   Alkaline Phosphatase 33 (L) 38 - 126 U/L  Total Bilirubin 0.8 0.3 - 1.2 mg/dL   GFR calc non Af Amer >60 >60 mL/min   GFR calc Af Amer >60 >60 mL/min   Anion gap 12 5 - 15  Lipase, blood   Collection Time: 08/21/19  4:30 AM  Result Value Ref Range   Lipase 20 11 - 51 U/L  Urine rapid drug screen (hosp performed)   Collection Time: 08/21/19  6:00 AM  Result Value Ref Range   Opiates NONE DETECTED NONE DETECTED   Cocaine NONE DETECTED NONE DETECTED   Benzodiazepines NONE DETECTED NONE DETECTED   Amphetamines NONE DETECTED NONE DETECTED   Tetrahydrocannabinol POSITIVE (A) NONE DETECTED   Barbiturates NONE DETECTED NONE DETECTED  Urinalysis, Routine w reflex microscopic   Collection Time: 08/21/19  6:00 AM  Result Value Ref Range   Color, Urine YELLOW YELLOW   APPearance HAZY (A) CLEAR   Specific Gravity, Urine 1.019 1.005 - 1.030   pH 6.0 5.0 - 8.0   Glucose, UA NEGATIVE NEGATIVE mg/dL   Hgb urine dipstick NEGATIVE NEGATIVE   Bilirubin Urine NEGATIVE NEGATIVE   Ketones, ur 80 (A) NEGATIVE mg/dL   Protein, ur 161 (A) NEGATIVE mg/dL   Nitrite NEGATIVE NEGATIVE   Leukocytes,Ua NEGATIVE NEGATIVE   RBC / HPF 0-5 0 - 5 RBC/hpf   WBC, UA 11-20 0 - 5 WBC/hpf   Bacteria, UA FEW (A) NONE SEEN   Squamous Epithelial / LPF 11-20 0 - 5   Mucus PRESENT   Results for orders placed or performed during the hospital encounter of 08/20/19 (from the past 168 hour(s))  CBC   Collection Time: 08/20/19 10:30 AM  Result Value Ref Range   WBC 13.0 (H) 4.0 - 10.5 K/uL   RBC 3.52 (L) 3.87 - 5.11 MIL/uL   Hemoglobin 11.2 (L) 12.0 - 15.0 g/dL   HCT 09.6 (L) 04.5 - 40.9 %   MCV 90.1 80.0 - 100.0 fL   MCH 31.8 26.0 - 34.0 pg   MCHC 35.3 30.0 - 36.0 g/dL   RDW 81.1 91.4 - 78.2 %   Platelets 209 150 - 400 K/uL   nRBC 0.0 0.0 - 0.2 %  Comprehensive metabolic panel   Collection Time: 08/20/19 10:30 AM  Result Value Ref Range   Sodium 139 135 -  145 mmol/L   Potassium 3.1 (L) 3.5 - 5.1 mmol/L   Chloride 107 98 - 111 mmol/L   CO2 19 (L) 22 - 32 mmol/L   Glucose, Bld 141 (H) 70 - 99 mg/dL   BUN 9 6 - 20 mg/dL   Creatinine, Ser 9.56 0.44 - 1.00 mg/dL   Calcium 8.8 (L) 8.9 - 10.3 mg/dL   Total Protein 6.5 6.5 - 8.1 g/dL   Albumin 3.4 (L) 3.5 - 5.0 g/dL   AST 13 (L) 15 - 41 U/L   ALT 10 0 - 44 U/L   Alkaline Phosphatase 35 (L) 38 - 126 U/L   Total Bilirubin 0.5 0.3 - 1.2 mg/dL   GFR calc non Af Amer >60 >60 mL/min   GFR calc Af Amer >60 >60 mL/min   Anion gap 13 5 - 15  CBC with Differential/Platelet   Collection Time: 08/20/19 10:31 AM  Result Value Ref Range   WBC 13.0 (H) 4.0 - 10.5 K/uL   RBC 3.52 (L) 3.87 - 5.11 MIL/uL   Hemoglobin 11.2 (L) 12.0 - 15.0 g/dL   HCT 21.3 (L) 08.6 - 57.8 %   MCV 91.5 80.0 - 100.0 fL  MCH 31.8 26.0 - 34.0 pg   MCHC 34.8 30.0 - 36.0 g/dL   RDW 94.4 96.7 - 59.1 %   Platelets 208 150 - 400 K/uL   nRBC 0.0 0.0 - 0.2 %   Neutrophils Relative % 93 %   Neutro Abs 12.0 (H) 1.7 - 7.7 K/uL   Lymphocytes Relative 5 %   Lymphs Abs 0.7 0.7 - 4.0 K/uL   Monocytes Relative 2 %   Monocytes Absolute 0.3 0.1 - 1.0 K/uL   Eosinophils Relative 0 %   Eosinophils Absolute 0.0 0.0 - 0.5 K/uL   Basophils Relative 0 %   Basophils Absolute 0.0 0.0 - 0.1 K/uL   Immature Granulocytes 0 %   Abs Immature Granulocytes 0.05 0.00 - 0.07 K/uL   CLINICAL DATA:  Seizure.  EXAM: MRI HEAD WITHOUT CONTRAST  TECHNIQUE: Multiplanar, multiecho pulse sequences of the brain and surrounding structures were obtained without intravenous contrast.  COMPARISON:  Head CT 04/05/2008  FINDINGS: The study is mildly motion degraded.  Brain: There is no evidence of acute infarct, intracranial hemorrhage, mass, midline shift, or extra-axial fluid collection. The ventricles and sulci are normal. The brain is normal in signal. The hippocampi are symmetric in size and signal.  Vascular: Major intracranial vascular flow  voids are preserved.  Skull and upper cervical spine: Unremarkable bone marrow signal.  Sinuses/Orbits: Bilateral exophthalmos. Paranasal sinuses and mastoid air cells are clear.  Other: None.  IMPRESSION: Unremarkable appearance of the brain.   Electronically Signed   By: Sebastian Ache M.D.   On: 08/22/2019 17:51  Discharge Condition: Stable  Disposition: Discharge disposition: 01-Home or Self Care        Discharge Instructions    Discharge patient   Complete by: As directed    Discharge disposition: 01-Home or Self Care   Discharge patient date: 08/23/2019     Allergies as of 08/23/2019      Reactions   Penicillins Rash   Childhood rash Has patient had a PCN reaction causing immediate rash, facial/tongue/throat swelling, SOB or lightheadedness with hypotension: no Has patient had a PCN reaction causing severe rash involving mucus membranes or skin necrosis:no Has patient had a PCN reaction that required hospitalization no Has patient had a PCN reaction occurring within the last 10 years: no If all of the above answers are "NO", then may proceed with Cephalosporin use.      Medication List    TAKE these medications   acetaminophen 325 MG tablet Commonly known as: TYLENOL Take 650 mg by mouth every 6 (six) hours as needed for mild pain or headache.   aspirin 81 MG chewable tablet Chew 1 tablet (81 mg total) by mouth daily.   Blood Pressure Monitor Automat Devi 1 Device by Does not apply route daily. Automatic blood pressure cuff regular/x-large size cuff. To monitor blood pressure regularly at home. ICD-10 code: O09.90.   Gojji Weight Scale Misc 1 each by Does not apply route daily as needed. Patient to weight daily as needed. ICD-10 code: O09.90   ondansetron 4 MG disintegrating tablet Commonly known as: Zofran ODT Take 1-2 tablets (4-8 mg total) by mouth every 6 (six) hours as needed for nausea or vomiting. For nausea and vomiting not relieved by  Phenergan   prenatal multivitamin Tabs tablet Take 1 tablet by mouth daily at 12 noon.   promethazine 25 MG tablet Commonly known as: PHENERGAN Take 1 tablet (25 mg total) by mouth every 6 (six) hours as needed for nausea  or vomiting.      Follow-up Information    [facility] Follow up in 2 week(s).   Specialty: Obstetrics and Gynecology Contact information: [place] [address] [phone]          Signed: Emeterio Reeve M.D. 08/23/2019, 8:26 AM

## 2019-08-23 NOTE — Progress Notes (Signed)
Pt discharged after discharge instructions given. All questions answered. IV and tele discontinued. Pt discharged in stable condition via wheelchair. All belongings sent with patient.

## 2019-08-23 NOTE — Discharge Instructions (Signed)
Hyperemesis Gravidarum Hyperemesis gravidarum is a severe form of nausea and vomiting that happens during pregnancy. Hyperemesis is worse than morning sickness. It may cause you to have nausea or vomiting all day for many days. It may keep you from eating and drinking enough food and liquids, which can lead to dehydration, malnutrition, and weight loss. Hyperemesis usually occurs during the first half (the first 20 weeks) of pregnancy. It often goes away once a woman is in her second half of pregnancy. However, sometimes hyperemesis continues through an entire pregnancy. What are the causes? The cause of this condition is not known. It may be related to changes in chemicals (hormones) in the body during pregnancy, such as the high level of pregnancy hormone (human chorionic gonadotropin) or the increase in the female sex hormone (estrogen). What are the signs or symptoms? Symptoms of this condition include:  Nausea that does not go away.  Vomiting that does not allow you to keep any food down.  Weight loss.  Body fluid loss (dehydration).  Having no desire to eat, or not liking food that you have previously enjoyed. How is this diagnosed? This condition may be diagnosed based on:  A physical exam.  Your medical history.  Your symptoms.  Blood tests.  Urine tests. How is this treated? This condition is managed by controlling symptoms. This may include:  Following an eating plan. This can help lessen nausea and vomiting.  Taking prescription medicines. An eating plan and medicines are often used together to help control symptoms. If medicines do not help relieve nausea and vomiting, you may need to receive fluids through an IV at the hospital. Follow these instructions at home: Eating and drinking   Avoid the following: ? Drinking fluids with meals. Try not to drink anything during the 30 minutes before and after your meals. ? Drinking more than 1 cup of fluid at a  time. ? Eating foods that trigger your symptoms. These may include spicy foods, coffee, high-fat foods, very sweet foods, and acidic foods. ? Skipping meals. Nausea can be more intense on an empty stomach. If you cannot tolerate food, do not force it. Try sucking on ice chips or other frozen items and make up for missed calories later. ? Lying down within 2 hours after eating. ? Being exposed to environmental triggers. These may include food smells, smoky rooms, closed spaces, rooms with strong smells, warm or humid places, overly loud and noisy rooms, and rooms with motion or flickering lights. Try eating meals in a well-ventilated area that is free of strong smells. ? Quick and sudden changes in your movement. ? Taking iron pills and multivitamins that contain iron. If you take prescription iron pills, do not stop taking them unless your health care provider approves. ? Preparing food. The smell of food can spoil your appetite or trigger nausea.  To help relieve your symptoms: ? Listen to your body. Everyone is different and has different preferences. Find what works best for you. ? Eat and drink slowly. ? Eat 5-6 small meals daily instead of 3 large meals. Eating small meals and snacks can help you avoid an empty stomach. ? In the morning, before getting out of bed, eat a couple of crackers to avoid moving around on an empty stomach. ? Try eating starchy foods as these are usually tolerated well. Examples include cereal, toast, bread, potatoes, pasta, rice, and pretzels. ? Include at least 1 serving of protein with your meals and snacks. Protein options include   lean meats, poultry, seafood, beans, nuts, nut butters, eggs, cheese, and yogurt. ? Try eating a protein-rich snack before bed. Examples of a protein-rick snack include cheese and crackers or a peanut butter sandwich made with 1 slice of whole-wheat bread and 1 tsp (5 g) of peanut butter. ? Eat or suck on things that have ginger in them.  It may help relieve nausea. Add  tsp ground ginger to hot tea or choose ginger tea. ? Try drinking 100% fruit juice or an electrolyte drink. An electrolyte drink contains sodium, potassium, and chloride. ? Drink fluids that are cold, clear, and carbonated or sour. Examples include lemonade, ginger ale, lemon-lime soda, ice water, and sparkling water. ? Brush your teeth or use a mouth rinse after meals. ? Talk with your health care provider about starting a supplement of vitamin B6. General instructions  Take over-the-counter and prescription medicines only as told by your health care provider.  Follow instructions from your health care provider about eating or drinking restrictions.  Continue to take your prenatal vitamins as told by your health care provider. If you are having trouble taking your prenatal vitamins, talk with your health care provider about different options.  Keep all follow-up and pre-birth (prenatal) visits as told by your health care provider. This is important. Contact a health care provider if:  You have pain in your abdomen.  You have a severe headache.  You have vision problems.  You are losing weight.  You feel weak or dizzy. Get help right away if:  You cannot drink fluids without vomiting.  You vomit blood.  You have constant nausea and vomiting.  You are very weak.  You faint.  You have a fever and your symptoms suddenly get worse. Summary  Hyperemesis gravidarum is a severe form of nausea and vomiting that happens during pregnancy.  Making some changes to your eating habits may help relieve nausea and vomiting.  This condition may be managed with medicine.  If medicines do not help relieve nausea and vomiting, you may need to receive fluids through an IV at the hospital. This information is not intended to replace advice given to you by your health care provider. Make sure you discuss any questions you have with your health care  provider. Document Revised: 07/01/2017 Document Reviewed: 02/08/2016 Elsevier Patient Education  2020 [employer]   Non-Epileptic Seizures, Adult A seizure can cause:  Involuntary movements, like falling or shaking.  Changes in awareness or consciousness.  Convulsions. These are episodes of uncontrollable, jerking movement caused by sudden, intense tightening (contraction) of the muscles. Epileptic seizures are caused by abnormal electrical activity in the brain. Non-epileptic seizures are different. They are not caused by abnormal electrical signals in your brain. These seizures may look like epileptic seizures, but they are not caused by epilepsy. There are two types of non-epileptic seizures:  Physiologic non-epileptic seizure. This type results from an underlying problem that causes a disruption in your brain's electrical activity.  Psychogenic non-epileptic seizure. This type results from emotional stress. These seizures are sometimes called pseudoseizures. What are the causes? Causes of physiologic non-epileptic seizures can include:  Sudden drop in blood pressure.  Low blood sugar (glucose).  Low levels of salt (sodium) in your blood.  Low levels of calcium in your blood.  Migraine.  Heart rhythm problems.  Sleep disorders, such as narcolepsy.  Movement disorders, such as Tourette syndrome.  Infection.  Certain medicines.  Drug and alcohol abuse.  Fever. Common causes of psychogenic  non-epileptic seizures can include:  Stress.  Emotional trauma.  Sexual or physical abuse.  Major life events, such as divorce or death of a loved one.  Mental health disorders, including anxiety and depression. What are the signs or symptoms? Symptoms of a non-epileptic seizure can be similar to those of an epileptic seizure, which may include:  A change in attention or behavior (altered mental status).  Loss of consciousness or fainting.  Convulsions with rhythmic  jerking movements.  Drooling.  Rapid eye movements.  Grunting.  Loss of bladder control and bowel control.  Bitter taste in the mouth.  Tongue biting. Some people experience unusual sensations (aura) before having a seizure. These can include:  "Butterflies" in the stomach.  Abnormal smells or tastes.  A feeling of having had a new experience before (dj vu). After a non-epileptic seizure, you may have a headache or sore muscles or feel confused and sleepy. Non-epileptic seizures usually:  Do not cause physical injuries.  Start slowly.  Include crying or shrieking.  Last longer than 2 minutes.  Include pelvic thrusting. How is this diagnosed? Non-epileptic seizures may be diagnosed by:  Your medical history.  A physical exam.  Your symptoms. ? Your health care provider may want to talk with your friends or relatives who have seen you have a seizure. ? If possible, it is helpful if you write down your seizure activity, including what led up to the seizure, and share that information with your health care provider. You may also need to have tests to look for causes of physiologic non-epileptic seizures. These may include:  An electroencephalogram (EEG). This test measures electrical activity in your brain. If you have had a non-epileptic seizure, the results of your EEG will likely be normal.  Video EEG. This test takes place in the hospital over the course of 2-7 days. The test uses a video camera and an EEG to monitor your symptoms and the electrical activity in your brain.  Blood tests.  Lumbar puncture. This test involves pulling fluid from your spine to check for infection.  Electrocardiogram (ECG or EKG). This test checks for an abnormal heart rhythm.  CT scan. If your health care provider thinks you have had a psychogenic non-epileptic seizure, you may need to see a mental health specialist for an evaluation. How is this treated? The treatment for your  seizures will depend on what is causing them. When the underlying condition is treated, your seizures should stop. If your seizures are being caused by emotional trauma or stress, your health care provider may recommend that you see a mental health professional. Treatment may include:  Relaxation therapy or cognitive behavioral therapy.  Medicines to treat depression or anxiety.  Individual or family counseling. In some cases, you may have psychogenic seizures in addition to epileptic seizures. If this is the case, you may be prescribed medicine to help with the epileptic seizures. Follow these instructions at home: Home care will depend on the type of non-epileptic seizures that you have. In general:  Follow all instructions from your health care provider. These may include ways to prevent seizures and what to do if you have a seizure.  Take over-the-counter and prescription medicines only as told by your health care provider.  Keep all follow-up visits as told by your health care provider. This is important.  Make sure family members, friends, and coworkers are trained on how to help you if you have a seizure. If you have a seizure, they should: ?  Lay you on the ground to prevent a fall. ? Place a pillow or piece of clothing under your head. ? Loosen any clothing around your neck. ? Turn you onto your side. If vomiting occurs, this helps keep your airway clear.  Avoid any substances that may prevent your medicine from working properly. If you are prescribed medicine for seizures: ? Do not use recreational drugs. ? Limit or avoid alcoholic beverages. Contact a health care provider if:  Your seizures change or become more frequent.  You continue to have seizures after treatment. Get help right away if:  You injure yourself during a seizure.  You have one seizure after another.  You have trouble recovering from a seizure.  You have chest pain or trouble breathing.  You have a  seizure that lasts longer than 5 minutes. Summary  Non-epileptic seizures may look like epileptic seizures, but they are not caused by epilepsy.  The treatment for your seizures will depend on what is causing them. When the underlying condition is treated, your seizures should stop.  Make sure family members, friends, and coworkers are trained on how to help you if you have a seizure. If you have a seizure, they should lay you on the ground to prevent a fall, protect your head and neck, and turn you onto your side. This information is not intended to replace advice given to you by your health care provider. Make sure you discuss any questions you have with your health care provider. Document Revised: 05/24/2017 Document Reviewed: 03/23/2016 Elsevier Patient Education  2020 [employer].

## 2019-08-23 NOTE — Plan of Care (Signed)
Follow up note Reviewed diagnostic data. -MRI brain WNL -EEG normal.  RECS: -Given first time ?seizure, with an overlay of stressors, I would not start on any Antiepileptic medications. -I would recommend refraining from marijuana use as excessive use can actually precipitate seizures. -If she has more seizures, I will have her hooked up to long term EEG and try to capture the spells if possible while on EEG, before making any long term antiepileptic medication decisions. -Medical management per primary team.  Spoke with Dr. Debroah Loop and relayed my plan. Please do not hesitate to call neurology as needed. Follow up OP OBGYN, PCP and neurology if needed.  -- Milon Dikes, MD [facility] Pager: [phone] If 7pm to 7am, please call on call as listed on [facility].

## 2019-08-24 ENCOUNTER — Encounter (HOSPITAL_COMMUNITY): Payer: Self-pay | Admitting: Obstetrics & Gynecology

## 2019-08-24 ENCOUNTER — Inpatient Hospital Stay (HOSPITAL_COMMUNITY)
Admission: AD | Admit: 2019-08-24 | Discharge: 2019-08-24 | Disposition: A | Discharge status: Home / Self Care | Payer: Medicaid Other | Attending: Obstetrics & Gynecology | Admitting: Obstetrics & Gynecology

## 2019-08-24 DIAGNOSIS — Z3A22 22 weeks gestation of pregnancy: Secondary | ICD-10-CM | POA: Diagnosis not present

## 2019-08-24 DIAGNOSIS — R1084 Generalized abdominal pain: Secondary | ICD-10-CM | POA: Diagnosis not present

## 2019-08-24 DIAGNOSIS — R197 Diarrhea, unspecified: Secondary | ICD-10-CM | POA: Diagnosis not present

## 2019-08-24 DIAGNOSIS — O212 Late vomiting of pregnancy: Secondary | ICD-10-CM | POA: Diagnosis present

## 2019-08-24 DIAGNOSIS — O99322 Drug use complicating pregnancy, second trimester: Secondary | ICD-10-CM | POA: Insufficient documentation

## 2019-08-24 DIAGNOSIS — Z880 Allergy status to penicillin: Secondary | ICD-10-CM | POA: Insufficient documentation

## 2019-08-24 DIAGNOSIS — F12188 Cannabis abuse with other cannabis-induced disorder: Secondary | ICD-10-CM | POA: Diagnosis not present

## 2019-08-24 DIAGNOSIS — O99332 Smoking (tobacco) complicating pregnancy, second trimester: Secondary | ICD-10-CM | POA: Diagnosis not present

## 2019-08-24 DIAGNOSIS — F17210 Nicotine dependence, cigarettes, uncomplicated: Secondary | ICD-10-CM | POA: Diagnosis not present

## 2019-08-24 DIAGNOSIS — R110 Nausea: Secondary | ICD-10-CM | POA: Diagnosis not present

## 2019-08-24 DIAGNOSIS — R52 Pain, unspecified: Secondary | ICD-10-CM | POA: Diagnosis not present

## 2019-08-24 DIAGNOSIS — F1290 Cannabis use, unspecified, uncomplicated: Secondary | ICD-10-CM

## 2019-08-24 DIAGNOSIS — R1111 Vomiting without nausea: Secondary | ICD-10-CM | POA: Diagnosis not present

## 2019-08-24 LAB — URINALYSIS, ROUTINE W REFLEX MICROSCOPIC
Bilirubin Urine: NEGATIVE
Glucose, UA: NEGATIVE mg/dL
Hgb urine dipstick: NEGATIVE
Ketones, ur: 5 mg/dL — AB
Leukocytes,Ua: NEGATIVE
Nitrite: NEGATIVE
Protein, ur: 30 mg/dL — AB
Specific Gravity, Urine: 1.016 (ref 1.005–1.030)
pH: 8 (ref 5.0–8.0)

## 2019-08-24 MED ORDER — ENSURE ENLIVE PO LIQD
2.0000 | Freq: Once | ORAL | Status: AC
  Administered 2019-08-24: 237 mL via ORAL
  Filled 2019-08-24: qty 474

## 2019-08-24 MED ORDER — ASPIRIN 81 MG PO CHEW: 81.0000 mg | CHEWABLE_TABLET | Freq: Every day | ORAL | 1 refills | Status: DC

## 2019-08-24 MED ORDER — SCOPOLAMINE 1 MG/3DAYS TD PT72
1.0000 | MEDICATED_PATCH | Freq: Once | TRANSDERMAL | Status: DC
  Administered 2019-08-24: 1.5 mg via TRANSDERMAL
  Filled 2019-08-24: qty 1

## 2019-08-24 MED ORDER — PROMETHAZINE HCL 25 MG/ML IJ SOLN
12.5000 mg | Freq: Once | INTRAMUSCULAR | Status: AC
  Administered 2019-08-24: 12.5 mg via INTRAVENOUS
  Filled 2019-08-24: qty 1

## 2019-08-24 MED ORDER — PREPLUS 27-1 MG PO TABS: 1.0000 | ORAL_TABLET | Freq: Once | ORAL | 3 refills | Status: AC

## 2019-08-24 MED ORDER — SCOPOLAMINE 1 MG/3DAYS TD PT72: 1.0000 | MEDICATED_PATCH | Freq: Once | TRANSDERMAL | 12 refills | Status: AC

## 2019-08-24 MED ORDER — ENSURE ENLIVE PO LIQD
2.0000 | Freq: Once | ORAL | Status: DC
  Filled 2019-08-24: qty 474

## 2019-08-24 MED ORDER — LACTATED RINGERS IV BOLUS
1000.0000 mL | Freq: Once | INTRAVENOUS | Status: AC
  Administered 2019-08-24: 1000 mL via INTRAVENOUS

## 2019-08-24 MED ORDER — PRENATAL MULTIVITAMIN CH: 1.0000 | ORAL_TABLET | Freq: Every day | ORAL | 3 refills | Status: DC

## 2019-08-24 MED ORDER — PANTOPRAZOLE SODIUM 40 MG IV SOLR
40.0000 mg | Freq: Once | INTRAVENOUS | Status: AC
  Administered 2019-08-24: 40 mg via INTRAVENOUS
  Filled 2019-08-24: qty 40

## 2019-08-24 MED ORDER — M.V.I. ADULT IV INJ
Freq: Once | INTRAVENOUS | Status: AC
  Filled 2019-08-24: qty 10

## 2019-08-24 NOTE — MAU Provider Note (Signed)
History     CSN: [id number]  Arrival date and time: 08/24/19 1307   None     Chief Complaint  Patient presents with  . Nausea  . Emesis   Leslie Yates is a 29 y.o. G1P0 at [redacted]w[redacted]d who receives care at [facility].  She presents today for Nausea and Emesis.  Patient states she has not been able to eat anything since discharge.  Patient reports multiple episodes of vomiting since discharge.  Patient tearful and requests NG tube for feedings. Patient states she "just feels like I need something to coat my stomach."  Patient reports she has not smoked MJ for 5 days.     OB History    Gravida  1   Para  0   Term  0   Preterm  0   AB  0   Living        SAB  0   TAB  0   Ectopic  0   Multiple      Live Births              Past Medical History:  Diagnosis Date  . Asthma   . Obesity   . Ovarian cyst   . Sickle cell trait [facility])     Past Surgical History:  Procedure Laterality Date  . INCISION AND DRAINAGE ABSCESS      Family History  Problem Relation Age of Onset  . Diabetes Mother   . Hypertension Mother   . Diabetes Father   . Hypertension Father     Social History   Tobacco Use  . Smoking status: Current Every Day Smoker    Packs/day: 0.25    Years: 11.00    Pack years: 2.75    Types: Cigarettes  . Smokeless tobacco: Never Used  Substance Use Topics  . Alcohol use: Not Currently    Comment: occ  . Drug use: Not Currently    Types: Benzodiazepines, Marijuana    Comment: States she never fill prescription, last time marijuana use yesterday    Allergies:  Allergies  Allergen Reactions  . Penicillins Rash    Childhood rash Has patient had a PCN reaction causing immediate rash, facial/tongue/throat swelling, SOB or lightheadedness with hypotension: no Has patient had a PCN reaction causing severe rash involving mucus membranes or skin necrosis:no Has patient had a PCN reaction that required hospitalization no Has patient had a PCN  reaction occurring within the last 10 years: no If all of the above answers are "NO", then may proceed with Cephalosporin use.     Medications Prior to Admission  Medication Sig Dispense Refill Last Dose  . ondansetron (ZOFRAN ODT) 4 MG disintegrating tablet Take 1-2 tablets (4-8 mg total) by mouth every 6 (six) hours as needed for nausea or vomiting. For nausea and vomiting not relieved by Phenergan 30 tablet 1 08/24/2019 at Unknown time  . promethazine (PHENERGAN) 25 MG tablet Take 1 tablet (25 mg total) by mouth every 6 (six) hours as needed for nausea or vomiting. 30 tablet 0 08/24/2019 at Unknown time  . acetaminophen (TYLENOL) 325 MG tablet Take 650 mg by mouth every 6 (six) hours as needed for mild pain or headache.     Marland Kitchen aspirin 81 MG chewable tablet Chew 1 tablet (81 mg total) by mouth daily. 60 tablet 1  at not taking   . Blood Pressure Monitoring (BLOOD PRESSURE MONITOR AUTOMAT) DEVI 1 Device by Does not apply route daily. Automatic blood pressure cuff  regular/x-large size cuff. To monitor blood pressure regularly at home. ICD-10 code: O09.90. 1 each 0   . Misc. Devices (GOJJI WEIGHT SCALE) MISC 1 each by Does not apply route daily as needed. Patient to weight daily as needed. ICD-10 code: O09.90 1 each 0   . Prenatal Vit-Fe Fumarate-FA (PRENATAL MULTIVITAMIN) TABS tablet Take 1 tablet by mouth daily at 12 noon.       Review of Systems  Constitutional: Negative for chills and fever.  Respiratory: Negative for cough and shortness of breath.   Gastrointestinal: Positive for nausea and vomiting.  Genitourinary: Negative for difficulty urinating, dysuria, vaginal bleeding and vaginal discharge.   Physical Exam   Blood pressure 131/61, pulse (!) 58, temperature 98.4 F (36.9 C), temperature source Oral, resp. rate 20, last menstrual period 02/28/2019, SpO2 100 %.  Physical Exam  Constitutional: She is oriented to person, place, and time. She appears well-developed and well-nourished.  She appears distressed (Laying in fetal position, tearful.).  HENT:  Head: Normocephalic and atraumatic.  Eyes: Conjunctivae are normal.  Cardiovascular: Normal rate.  Respiratory: Effort normal.  GI: Soft.  Musculoskeletal:     Cervical back: Normal range of motion.  Neurological: She is alert and oriented to person, place, and time.  Skin: Skin is warm and dry.  Psychiatric: She has a normal mood and affect. Her behavior is normal.    MAU Course  Procedures Results for orders placed or performed during the hospital encounter of 08/24/19 (from the past 24 hour(s))  Urinalysis, Routine w reflex microscopic     Status: Abnormal   Collection Time: 08/24/19  2:30 PM  Result Value Ref Range   Color, Urine AMBER (A) YELLOW   APPearance HAZY (A) CLEAR   Specific Gravity, Urine 1.016 1.005 - 1.030   pH 8.0 5.0 - 8.0   Glucose, UA NEGATIVE NEGATIVE mg/dL   Hgb urine dipstick NEGATIVE NEGATIVE   Bilirubin Urine NEGATIVE NEGATIVE   Ketones, ur 5 (A) NEGATIVE mg/dL   Protein, ur 30 (A) NEGATIVE mg/dL   Nitrite NEGATIVE NEGATIVE   Leukocytes,Ua NEGATIVE NEGATIVE   RBC / HPF 0-5 0 - 5 RBC/hpf   WBC, UA 0-5 0 - 5 WBC/hpf   Bacteria, UA RARE (A) NONE SEEN   Squamous Epithelial / LPF 11-20 0 - 5   Mucus PRESENT     MDM Start IV LR Bolus f/b Banana Bag Antiemetic PPI Ensure Assessment and Plan  29 year old, G1P0  SIUP at 22.4weeks Cannabinoid Hyperemesis   -Reviewed POC with patient. -Reassured and informed that this outcome was expected d/t history of cannabis usage. -Discussed usage of ensure for meal substitute while establishing  -Will start IV  -Give phenergan and scopolamine patch for nausea. -Give protonix 40mg  now -Patient encouraged to rest -Will monitor and reassess  08/24/2019, 1:35 PM   Reassessment (4:34 PM)  -Patient reports that her symptoms have improved. -She has drank some Ensure with good results. -2 additional bottles of Ensure ordered  for home usage.  -Will give additional bag of fluid and then reassess.  Reassessment (5:45 PM)  -Patient reports that she was able to keep Ensure down. -Instructed to obtain some for home usage. -Discussed how her symptoms would gradually improve as long as she remained off. -Patient requests refill for PNV and bASA.  Requests sent to pharmacy. -Encouraged to call if symptoms worsen or with onset of new symptoms. -Discharged to home in stable condition.  10/24/2019 MSN, CNM  Energy manager, [facility] [employer]

## 2019-08-24 NOTE — MAU Note (Signed)
PT was discharged from [facility] yesterday after being admitted for nausea/vomiting. Says she has vomited 15 times since leaving yesterday. She is unable to tolerate any sips or food. She took Zofran this morning and then took Phenergan. She states that the medication does not stay down.

## 2019-08-24 NOTE — Discharge Instructions (Signed)
Cannabinoid Hyperemesis Syndrome Cannabinoid hyperemesis syndrome (CHS) is a condition that causes repeated nausea, vomiting, and abdominal pain after long-term (chronic) use of marijuana (cannabis). People with CHS typically use marijuana 3-5 times a day for many years before they have symptoms, although it is possible to develop CHS with as little as 1 use per day. Symptoms of CHS may be mild at first but can get worse and more frequent. In some cases, CHS may cause vomiting many times a day, which can lead to weight loss and dehydration. CHS may go away and come back many times (recur). People may not have symptoms or may otherwise be healthy in between CHS attacks. What are the causes? The exact cause of this condition is not known. Long-term use of marijuana may over-stimulate certain proteins in the brain that react with chemicals in marijuana (cannabinoid receptors). This over-stimulation may cause CHS. What are the signs or symptoms? Symptoms of this condition are often mild during the first few attacks, but they can get worse over time. Symptoms may include:  Frequent nausea, especially early in the morning.  Vomiting.  Abdominal pain. Taking several hot showers throughout the day can also be a sign of this condition. People with CHS may do this because it relieves symptoms. How is this diagnosed? This condition may be diagnosed based on:  Your symptoms and medical history, including any drug use.  A physical exam. You may have tests done to rule out other problems. These tests may include:  Blood tests.  Urine tests.  Imaging tests, such as an X-ray or CT scan. How is this treated? Treatment for this condition involves stopping marijuana use. Your health care provider may recommend:  A drug rehabilitation program, if you have trouble stopping marijuana use.  Medicines for nausea.  Hot showers to help relieve symptoms. Certain creams that contain a substance called  capsaicin may improve symptoms when applied to the abdomen. Ask your health care provider before starting any medicines or other treatments. Severe nausea and vomiting may require you to stay at the hospital. You may need IV fluids to prevent or treat dehydration. You may also need certain medicines that must be given at the hospital. Follow these instructions at home: During an attack   Stay in bed and rest in a dark, quiet room.  Take anti-nausea medicine as told by your health care provider.  Try taking hot showers to relieve your symptoms. After an attack  Drink small amounts of clear fluids slowly. Gradually add more.  Once you are able to eat without vomiting, eat soft foods in small amounts every 3-4 hours. General instructions   Do not use any products that contain marijuana.If you need help quitting, ask your health care provider for resources and treatment options.  Drink enough fluid to keep your urine pale yellow. Avoid drinking fluids that have a lot of sugar or caffeine, such as coffee and soda.  Take and apply over-the-counter and prescription medicines only as told by your health care provider. Ask your health care provider before starting any new medicines or treatments.  Keep all follow-up visits as told by your health care provider. This is important. Contact a health care provider if:  Your symptoms get worse.  You cannot drink fluids without vomiting.  You have pain and trouble swallowing after an attack. Get help right away if:  You cannot stop vomiting.  You have blood in your vomit or your vomit looks like coffee grounds.  You have   severe abdominal pain.  You have stools that are bloody or black, or stools that look like tar.  You have symptoms of dehydration, such as: ? Sunken eyes. ? Inability to make tears. ? Cracked lips. ? Dry mouth. ? Decreased urine production. ? Weakness. ? Sleepiness. ? Fainting. Summary  Cannabinoid hyperemesis  syndrome (CHS) is a condition that causes repeated nausea, vomiting, and abdominal pain after long-term use of marijuana.  People with CHS typically use marijuana 3-5 times a day for many years before they have symptoms, although it is possible to develop CHS with as little as 1 use per day.  Treatment for this condition involves stopping marijuana use. Hot showers and capsaicin creams may also help relieve symptoms. Ask your health care provider before starting any medicines or other treatments.  Your health care provider may prescribe medicines to help with nausea.  Get help right away if you have signs of dehydration, such as dry mouth, decreased urine production, or weakness. This information is not intended to replace advice given to you by your health care provider. Make sure you discuss any questions you have with your health care provider. Document Revised: 10/18/2017 Document Reviewed: 09/19/2016 Elsevier Patient Education  2020 Elsevier Inc.  

## 2019-08-26 ENCOUNTER — Other Ambulatory Visit: Payer: Self-pay | Admitting: Advanced Practice Midwife

## 2019-08-26 MED ORDER — SCOPOLAMINE 1 MG/3DAYS TD PT72: 1.0000 | MEDICATED_PATCH | TRANSDERMAL | 12 refills | Status: DC

## 2019-08-26 MED ORDER — PRENATAL 19 29-1 MG PO TABS: 1.0000 | ORAL_TABLET | Freq: Every day | ORAL | 12 refills | Status: DC

## 2019-08-26 NOTE — Progress Notes (Signed)
Patient called the office and states that pharmacy won't fill scopolamine patch because it needs to come from the office and not the ER. She is also requesting prenatal vitamin rx. RX sent to pharmacy on file.   Thressa Sheller DNP, CNM  08/26/19  10:05 AM

## 2019-09-04 ENCOUNTER — Telehealth (INDEPENDENT_AMBULATORY_CARE_PROVIDER_SITE_OTHER): Payer: Medicaid Other | Admitting: Certified Nurse Midwife

## 2019-09-04 DIAGNOSIS — O99212 Obesity complicating pregnancy, second trimester: Secondary | ICD-10-CM

## 2019-09-04 DIAGNOSIS — O99322 Drug use complicating pregnancy, second trimester: Secondary | ICD-10-CM

## 2019-09-04 DIAGNOSIS — Z3A24 24 weeks gestation of pregnancy: Secondary | ICD-10-CM

## 2019-09-04 DIAGNOSIS — Z3400 Encounter for supervision of normal first pregnancy, unspecified trimester: Secondary | ICD-10-CM

## 2019-09-04 DIAGNOSIS — O99210 Obesity complicating pregnancy, unspecified trimester: Secondary | ICD-10-CM

## 2019-09-04 NOTE — Patient Instructions (Signed)
Glucose Tolerance Test During Pregnancy Why am I having this test? The glucose tolerance test (GTT) is done to check how your body processes sugar (glucose). This is one of several tests used to diagnose diabetes that develops during pregnancy (gestational diabetes mellitus). Gestational diabetes is a temporary form of diabetes that some women develop during pregnancy. It usually occurs during the second trimester of pregnancy and goes away after delivery. Testing (screening) for gestational diabetes usually occurs between 24 and 28 weeks of pregnancy. You may have the GTT test after having a 1-hour glucose screening test if the results from that test indicate that you may have gestational diabetes. You may also have this test if:  You have a history of gestational diabetes.  You have a history of giving birth to very large babies or have experienced repeated fetal loss (stillbirth).  You have signs and symptoms of diabetes, such as: ? Changes in your vision. ? Tingling or numbness in your hands or feet. ? Changes in hunger, thirst, and urination that are not otherwise explained by your pregnancy. What is being tested? This test measures the amount of glucose in your blood at different times during a period of 3 hours. This indicates how well your body is able to process glucose. What kind of sample is taken?  Blood samples are required for this test. They are usually collected by inserting a needle into a blood vessel. How do I prepare for this test?  For 3 days before your test, eat normally. Have plenty of carbohydrate-rich foods.  Follow instructions from your health care provider about: ? Eating or drinking restrictions on the day of the test. You may be asked to not eat or drink anything other than water (fast) starting 8-10 hours before the test. ? Changing or stopping your regular medicines. Some medicines may interfere with this test. Tell a health care provider about:  All  medicines you are taking, including vitamins, herbs, eye drops, creams, and over-the-counter medicines.  Any blood disorders you have.  Any surgeries you have had.  Any medical conditions you have. What happens during the test? First, your blood glucose will be measured. This is referred to as your fasting blood glucose, since you fasted before the test. Then, you will drink a glucose solution that contains a certain amount of glucose. Your blood glucose will be measured again 1, 2, and 3 hours after drinking the solution. This test takes about 3 hours to complete. You will need to stay at the testing location during this time. During the testing period:  Do not eat or drink anything other than the glucose solution.  Do not exercise.  Do not use any products that contain nicotine or tobacco, such as cigarettes and e-cigarettes. If you need help stopping, ask your health care provider. The testing procedure may vary among health care providers and hospitals. How are the results reported? Your results will be reported as milligrams of glucose per deciliter of blood (mg/dL) or millimoles per liter (mmol/L). Your health care provider will compare your results to normal ranges that were established after testing a large group of people (reference ranges). Reference ranges may vary among labs and hospitals. For this test, common reference ranges are:  Fasting: less than 95-105 mg/dL (5.3-5.8 mmol/L).  1 hour after drinking glucose: less than 180-190 mg/dL (10.0-10.5 mmol/L).  2 hours after drinking glucose: less than 155-165 mg/dL (8.6-9.2 mmol/L).  3 hours after drinking glucose: 140-145 mg/dL (7.8-8.1 mmol/L). What do the   results mean? Results within reference ranges are considered normal, meaning that your glucose levels are well-controlled. If two or more of your blood glucose levels are high, you may be diagnosed with gestational diabetes. If only one level is high, your health care  provider may suggest repeat testing or other tests to confirm a diagnosis. Talk with your health care provider about what your results mean. Questions to ask your health care provider Ask your health care provider, or the department that is doing the test:  When will my results be ready?  How will I get my results?  What are my treatment options?  What other tests do I need?  What are my next steps? Summary  The glucose tolerance test (GTT) is one of several tests used to diagnose diabetes that develops during pregnancy (gestational diabetes mellitus). Gestational diabetes is a temporary form of diabetes that some women develop during pregnancy.  You may have the GTT test after having a 1-hour glucose screening test if the results from that test indicate that you may have gestational diabetes. You may also have this test if you have any symptoms or risk factors for gestational diabetes.  Talk with your health care provider about what your results mean. This information is not intended to replace advice given to you by your health care provider. Make sure you discuss any questions you have with your health care provider. Document Revised: 10/02/2018 Document Reviewed: 01/21/2017 Elsevier Patient Education  2020 Elsevier Inc.  

## 2019-09-04 NOTE — Progress Notes (Signed)
[facility] VIRTUAL VIDEO VISIT ENCOUNTER NOTE  Provider location: [facility] at [place]   I connected with [name] on 09/04/19 at 10:10 AM EST by MyChart Video Encounter at home and verified that I am speaking with the correct person using two identifiers.   I discussed the limitations, risks, security and privacy concerns of performing an evaluation and management service virtually and the availability of in person appointments. I also discussed with the patient that there may be a patient responsible charge related to this service. The patient expressed understanding and agreed to proceed. Subjective:  Leslie Yates is a 29 y.o. G1P0000 at [redacted]w[redacted]d being seen today for ongoing prenatal care.  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  Patient reports no complaints.  Contractions: Not present. Vag. Bleeding: None.  Movement: Present. Denies any leaking of fluid.   The following portions of the patient's history were reviewed and updated as appropriate: allergies, current medications, past family history, past medical history, past social history, past surgical history and problem list.   Objective:  There were no vitals filed for this visit.  Fetal Status:     Movement: Present     General:  Alert, oriented and cooperative. Patient is in no acute distress.  Respiratory: Normal respiratory effort, no problems with respiration noted  Mental Status: Normal mood and affect. Normal behavior. Normal judgment and thought content.  Rest of physical exam deferred due  to type of encounter  Imaging: MR BRAIN WO CONTRAST  Result Date: 08/22/2019 CLINICAL DATA:  Seizure. EXAM: MRI HEAD WITHOUT CONTRAST TECHNIQUE: Multiplanar, multiecho pulse sequences of the brain and surrounding structures were obtained without intravenous contrast. COMPARISON:  Head CT 04/05/2008 FINDINGS: The study is mildly motion degraded. Brain: There is no evidence of acute infarct, intracranial hemorrhage, mass, midline shift, or extra-axial fluid collection. The ventricles and sulci are normal. The brain is normal in signal. The hippocampi are symmetric in size and signal. Vascular: Major intracranial vascular flow voids are preserved. Skull and upper cervical spine: Unremarkable bone marrow signal. Sinuses/Orbits: Bilateral exophthalmos. Paranasal sinuses and mastoid air cells are clear. Other: None. IMPRESSION: Unremarkable appearance of the brain. Electronically Signed   By: Logan Bores M.D.   On: 08/22/2019 17:51   EEG adult  Result Date: 08/22/2019 Lora Havens, MD     08/22/2019  7:15 PM Patient Name: Leslie Yates MRN: [id number] Epilepsy Attending: Lora Havens Referring Physician/Provider: Dr Amie [place] Date: 08/22/2019 Duration: 24.01 mins Patient history: 29 year old, [redacted] weeks pregnant and was noted to have witnessed seizure in the MAU. EEG to evaluate for seizure. Level of alertness:  Awake, asleep AEDs during EEG study: None Technical aspects: This EEG study was done with scalp electrodes positioned according to the 10-20 International system of electrode placement. Electrical activity was acquired at a sampling rate of 500Hz  and reviewed with a high frequency filter of 70Hz  and a low frequency filter of 1Hz . EEG data were recorded continuously and digitally stored. DESCRIPTION:  The posterior dominant rhythm consists of 9-10 Hz activity of moderate voltage (25-35 uV) seen predominantly in posterior head regions, symmetric and reactive to eye opening  and eye closing.Sleep was  characterized by vertex waves, sleep spindles (12-14hz ), maximal frontocentral. Hyperventilation and photic stimulation were not performed. IMPRESSION: This study is within normal limits. No seizures or epileptiform discharges were seen throughout the recording. Priyanka O Yadav    Assessment and Plan:  Pregnancy: G1P0000 at [redacted]w[redacted]d 1. Obesity in pregnancy, antepartum - continue bASA - discussed recommended weight gain during pregnancy   2. Supervision of normal first pregnancy, antepartum - Patient doing well  - Patient reports that fetal movement is not regular. Discussed with patient that with anterior placenta can affect fetal movement until later in pregnancy around 28 weeks when uterus is bigger, patient verbalizes understanding  - Routine prenatal care - Anticipatory guidance on upcoming appointments with GTT at next appointment, discussed with patient need to be fasting after midnight prior to next appointment, patient verbalizes understanding  - Patient reports nausea and vomiting has gotten better since discharge from hospital   3. 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 I discussed the assessment and treatment plan with the patient. The patient was provided an opportunity to ask questions and all were answered. The patient agreed with the plan and demonstrated an understanding of the instructions. The patient was advised to call back or seek an in-person office evaluation/go [facility] for any urgent or concerning symptoms. Please refer to After Visit Summary for other counseling recommendations.   I provided 12 minutes of face-to-face time during this  encounter.  Return in about 4 weeks (around 10/02/2019) for ROB/GTT.  Future Appointments  Date Time Provider Department Center  10/01/2019  1:15 PM [facility] [place] 4 [facility]  10/02/2019  8:10 AM Judeth Horn, NP [facility] None    Sharyon Cable, CNM [facility] for [employer], [facility]

## 2019-09-09 ENCOUNTER — Ambulatory Visit: Payer: Medicaid Other | Admitting: Licensed Clinical Social Worker

## 2019-09-09 NOTE — BH Specialist Note (Signed)
Called pt regarding scheduled visit. Unable to leave voicemail

## 2019-09-10 ENCOUNTER — Encounter: Payer: Self-pay | Admitting: Certified Nurse Midwife

## 2019-09-17 ENCOUNTER — Other Ambulatory Visit: Payer: Self-pay

## 2019-09-17 ENCOUNTER — Ambulatory Visit (INDEPENDENT_AMBULATORY_CARE_PROVIDER_SITE_OTHER): Payer: Medicaid Other | Admitting: Licensed Clinical Social Worker

## 2019-09-17 DIAGNOSIS — T7491XA Unspecified adult maltreatment, confirmed, initial encounter: Secondary | ICD-10-CM | POA: Diagnosis not present

## 2019-09-17 DIAGNOSIS — Z3400 Encounter for supervision of normal first pregnancy, unspecified trimester: Secondary | ICD-10-CM | POA: Diagnosis not present

## 2019-09-17 DIAGNOSIS — Z658 Other specified problems related to psychosocial circumstances: Secondary | ICD-10-CM
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
## 2019-09-24 ENCOUNTER — Other Ambulatory Visit: Payer: Self-pay | Admitting: *Deleted

## 2019-09-24 DIAGNOSIS — O219 Vomiting of pregnancy, unspecified: Secondary | ICD-10-CM

## 2019-09-24 MED ORDER — ONDANSETRON 4 MG PO TBDP: 4.0000 mg | ORAL_TABLET | Freq: Four times a day (QID) | ORAL | 2 refills | Status: DC | PRN

## 2019-09-24 NOTE — Progress Notes (Signed)
Patient request refill.  Clovis Pu, RN

## 2019-10-01 ENCOUNTER — Ambulatory Visit (HOSPITAL_COMMUNITY)
Admission: RE | Admit: 2019-10-01 | Discharge: 2019-10-01 | Disposition: A | Discharge status: Home / Self Care | Payer: Medicaid Other | Source: Ambulatory Visit | Attending: Obstetrics and Gynecology | Admitting: Obstetrics and Gynecology

## 2019-10-01 ENCOUNTER — Other Ambulatory Visit: Payer: Self-pay

## 2019-10-01 DIAGNOSIS — Z362 Encounter for other antenatal screening follow-up: Secondary | ICD-10-CM | POA: Insufficient documentation

## 2019-10-01 DIAGNOSIS — Z148 Genetic carrier of other disease: Secondary | ICD-10-CM

## 2019-10-01 DIAGNOSIS — O210 Mild hyperemesis gravidarum: Secondary | ICD-10-CM

## 2019-10-01 DIAGNOSIS — O99333 Smoking (tobacco) complicating pregnancy, third trimester: Secondary | ICD-10-CM

## 2019-10-01 DIAGNOSIS — O99320 Drug use complicating pregnancy, unspecified trimester: Secondary | ICD-10-CM

## 2019-10-01 DIAGNOSIS — O3480 Maternal care for other abnormalities of pelvic organs, unspecified trimester: Secondary | ICD-10-CM

## 2019-10-01 DIAGNOSIS — O99213 Obesity complicating pregnancy, third trimester: Secondary | ICD-10-CM

## 2019-10-01 DIAGNOSIS — F1910 Other psychoactive substance abuse, uncomplicated: Secondary | ICD-10-CM

## 2019-10-01 DIAGNOSIS — Z3A28 28 weeks gestation of pregnancy: Secondary | ICD-10-CM

## 2019-10-02 ENCOUNTER — Encounter: Payer: Self-pay | Admitting: General Practice

## 2019-10-02 ENCOUNTER — Encounter: Payer: Self-pay | Admitting: Obstetrics and Gynecology

## 2019-10-02 ENCOUNTER — Ambulatory Visit (INDEPENDENT_AMBULATORY_CARE_PROVIDER_SITE_OTHER): Payer: Medicaid Other | Admitting: Obstetrics and Gynecology

## 2019-10-02 VITALS — BP 105/64 | HR 67 | Temp 97.9°F | Wt 259.4 lb

## 2019-10-02 DIAGNOSIS — Z3A28 28 weeks gestation of pregnancy: Secondary | ICD-10-CM

## 2019-10-02 DIAGNOSIS — Z23 Encounter for immunization: Secondary | ICD-10-CM | POA: Diagnosis not present

## 2019-10-02 DIAGNOSIS — E669 Obesity, unspecified: Secondary | ICD-10-CM

## 2019-10-02 DIAGNOSIS — Z3400 Encounter for supervision of normal first pregnancy, unspecified trimester: Secondary | ICD-10-CM

## 2019-10-02 DIAGNOSIS — Z3403 Encounter for supervision of normal first pregnancy, third trimester: Secondary | ICD-10-CM | POA: Diagnosis not present

## 2019-10-02 DIAGNOSIS — O99210 Obesity complicating pregnancy, unspecified trimester: Secondary | ICD-10-CM

## 2019-10-02 DIAGNOSIS — O99213 Obesity complicating pregnancy, third trimester: Secondary | ICD-10-CM

## 2019-10-02 NOTE — Patient Instructions (Addendum)
Fetal Movement Counts Patient Name: ________________________________________________ Patient Due Date: ____________________ What is a fetal movement count?  A fetal movement count is the number of times that you feel your baby move during a certain amount of time. This may also be called a fetal kick count. A fetal movement count is recommended for every pregnant woman. You may be asked to start counting fetal movements as early as week 28 of your pregnancy. Pay attention to when your baby is most active. You may notice your baby's sleep and wake cycles. You may also notice things that make your baby move more. You should do a fetal movement count:  When your baby is normally most active.  At the same time each day. A good time to count movements is while you are resting, after having something to eat and drink. How do I count fetal movements? 1. Find a quiet, comfortable area. Sit, or lie down on your side. 2. Write down the date, the start time and stop time, and the number of movements that you felt between those two times. Take this information with you to your health care visits. 3. Write down your start time when you feel the first movement. 4. Count kicks, flutters, swishes, rolls, and jabs. You should feel at least 10 movements. 5. You may stop counting after you have felt 10 movements, or if you have been counting for 2 hours. Write down the stop time. 6. If you do not feel 10 movements in 2 hours, contact your health care provider for further instructions. Your health care provider may want to do additional tests to assess your baby's well-being. Contact a health care provider if:  You feel fewer than 10 movements in 2 hours.  Your baby is not moving like he or she usually does. Date: ____________ Start time: ____________ Stop time: ____________ Movements: ____________ Date: ____________ Start time: ____________ Stop time: ____________ Movements: ____________ Date: ____________  Start time: ____________ Stop time: ____________ Movements: ____________ Date: ____________ Start time: ____________ Stop time: ____________ Movements: ____________ Date: ____________ Start time: ____________ Stop time: ____________ Movements: ____________ Date: ____________ Start time: ____________ Stop time: ____________ Movements: ____________ Date: ____________ Start time: ____________ Stop time: ____________ Movements: ____________ Date: ____________ Start time: ____________ Stop time: ____________ Movements: ____________ Date: ____________ Start time: ____________ Stop time: ____________ Movements: ____________ This information is not intended to replace advice given to you by your health care provider. Make sure you discuss any questions you have with your health care provider. Document Revised: 01/29/2019 Document Reviewed: 01/29/2019 Elsevier Patient Education  2020 Elsevier Inc[employer] Iron-Rich Diet  Iron is a mineral that helps your body to produce hemoglobin. Hemoglobin is a protein in red blood cells that carries oxygen to your body's tissues. Eating too little iron may cause you to feel weak and tired, and it can increase your risk of infection. Iron is naturally found in many foods, and many foods have iron added to them (iron-fortified foods). You may need to follow an iron-rich diet if you do not have enough iron in your body due to certain medical conditions. The amount of iron that you need each day depends on your age, your sex, and any medical conditions you have. Follow instructions from your health care provider or a diet and nutrition specialist (dietitian) about how much iron you should eat each day. What are tips for following this plan? Reading food labels  Check food labels to see how many milligrams (mg) of iron are in each   serving. Cooking  Cook foods in pots and pans that are made from iron.  Take these steps to make it easier for your body to absorb iron from certain  foods: ? Soak beans overnight before cooking. ? Soak whole grains overnight and drain them before using. ? Ferment flours before baking, such as by using yeast in bread dough. Meal planning  When you eat foods that contain iron, you should eat them with foods that are high in vitamin C. These include oranges, peppers, tomatoes, potatoes, and mango. Vitamin C helps your body to absorb iron. General information  Take iron supplements only as told by your health care provider. An overdose of iron can be life-threatening. If you were prescribed iron supplements, take them with orange juice or a vitamin C supplement.  When you eat iron-fortified foods or take an iron supplement, you should also eat foods that naturally contain iron, such as meat, poultry, and fish. Eating naturally iron-rich foods helps your body to absorb the iron that is added to other foods or contained in a supplement.  Certain foods and drinks prevent your body from absorbing iron properly. Avoid eating these foods in the same meal as iron-rich foods or with iron supplements. These foods include: ? Coffee, black tea, and red wine. ? Milk, dairy products, and foods that are high in calcium. ? Beans and soybeans. ? Whole grains. What foods should I eat? Fruits Prunes. Raisins. Eat fruits high in vitamin C, such as oranges, grapefruits, and strawberries, alongside iron-rich foods. Vegetables Spinach (cooked). Green peas. Broccoli. Fermented vegetables. Eat vegetables high in vitamin C, such as leafy greens, potatoes, bell peppers, and tomatoes, alongside iron-rich foods. Grains Iron-fortified breakfast cereal. Iron-fortified whole-wheat bread. Enriched rice. Sprouted grains. Meats and other proteins Beef liver. Oysters. Beef. Shrimp. Turkey. Chicken. Tuna. Sardines. Chickpeas. Nuts. Tofu. Pumpkin seeds. Beverages Tomato juice. Fresh orange juice. Prune juice. Hibiscus tea. Fortified instant breakfast shakes. Sweets and  desserts Blackstrap molasses. Seasonings and condiments Tahini. Fermented soy sauce. Other foods Wheat germ. The items listed above may not be a complete list of recommended foods and beverages. Contact a dietitian for more information. What foods should I avoid? Grains Whole grains. Bran cereal. Bran flour. Oats. Meats and other proteins Soybeans. Products made from soy protein. Black beans. Lentils. Mung beans. Split peas. Dairy Milk. Cream. Cheese. Yogurt. Cottage cheese. Beverages Coffee. Black tea. Red wine. Sweets and desserts Cocoa. Chocolate. Ice cream. Other foods Basil. Oregano. Large amounts of parsley. The items listed above may not be a complete list of foods and beverages to avoid. Contact a dietitian for more information. Summary  Iron is a mineral that helps your body to produce hemoglobin. Hemoglobin is a protein in red blood cells that carries oxygen to your body's tissues.  Iron is naturally found in many foods, and many foods have iron added to them (iron-fortified foods).  When you eat foods that contain iron, you should eat them with foods that are high in vitamin C. Vitamin C helps your body to absorb iron.  Certain foods and drinks prevent your body from absorbing iron properly, such as whole grains and dairy products. You should avoid eating these foods in the same meal as iron-rich foods or with iron supplements. This information is not intended to replace advice given to you by your health care provider. Make sure you discuss any questions you have with your health care provider. Document Revised: 05/24/2017 Document Reviewed: 05/07/2017 Elsevier Patient Education  2020 Elsevier   Inc.  

## 2019-10-02 NOTE — Progress Notes (Signed)
   LOW-RISK PREGNANCY OFFICE VISIT Patient name: Leslie Yates MRN [id number]  Date of birth: 12-May-1991 Chief Complaint:   Routine Prenatal Visit  History of Present Illness:   Leslie Yates is a 29 y.o. G1P0000 female at [redacted]w[redacted]d with an Estimated Date of Delivery: 12/24/19 being seen today for ongoing management of a low-risk pregnancy.  Today she reports pressure. Contractions: Not present. Vag. Bleeding: None.  Movement: Present. denies leaking of fluid. Review of Systems:   Pertinent items are noted in HPI Denies abnormal vaginal discharge w/ itching/odor/irritation, headaches, visual changes, shortness of breath, chest pain, abdominal pain, severe nausea/vomiting, or problems with urination or bowel movements unless otherwise stated above. Pertinent History Reviewed:  Reviewed past medical,surgical, social, obstetrical and family history.  Reviewed problem list, medications and allergies. Physical Assessment:   Vitals:   10/02/19 0811  BP: 105/64  Pulse: 67  Temp: 97.9 F (36.6 C)  Weight: 259 lb 6.4 oz (117.7 kg)  Body mass index is 35.18 kg/m.        Physical Examination:   General appearance: Well appearing, and in no distress  Mental status: Alert, oriented to person, place, and time  Skin: Warm & dry  Cardiovascular: Normal heart rate noted  Respiratory: Normal respiratory effort, no distress  Abdomen: Soft, gravid, nontender  Pelvic: Cervical exam deferred         Extremities: Edema: None  Fetal Status:     Movement: Present    No results found for this or any previous visit (from the past 24 hour(s)).  Assessment & Plan:  1) Low-risk pregnancy G1P0000 at [redacted]w[redacted]d with an Estimated Date of Delivery: 12/24/19   2) Supervision of normal first pregnancy, antepartum  - Glucose Tolerance, 2 Hours w/1 Hour,  - RPR, - CBC,  - HIV Antibody (routine testing w rflx),  - Information provided on iron rich diet and FKC   3) Need for tetanus, diphtheria, and acellular pertussis  (Tdap) vaccine in patient of adolescent age or older - Tdap vaccine greater than or equal to 7yo IM  4) Obesity in pregnancy, antepartum - Advised to make sure he only gains 11-20 pounds from pre-pregnancy weight   Meds: No orders of the defined types were placed in this encounter.  Labs/procedures today: 2hr GTT, 3rd trimester labs, tdap  Plan:  Continue routine obstetrical care   Reviewed: Preterm labor symptoms and general obstetric precautions including but not limited to vaginal bleeding, contractions, leaking of fluid and fetal movement were reviewed in detail with the patient.  All questions were answered. Has home bp cuff. Check bp weekly, let us know if >140/90.   Follow-up: Return in about 4 weeks (around 10/30/2019) for Return OB - My Chart video.  Orders Placed This Encounter  Procedures  . Tdap vaccine greater than or equal to 7yo IM  . Glucose Tolerance, 2 Hours w/1 Hour  . RPR  . CBC  . HIV Antibody (routine testing w rflx)   [name] MSN, CNM 10/02/2019

## 2019-10-03 LAB — CBC
Hematocrit: 30.8 % — ABNORMAL LOW (ref 34.0–46.6)
Hemoglobin: 10.8 g/dL — ABNORMAL LOW (ref 11.1–15.9)
MCH: 32.1 pg (ref 26.6–33.0)
MCHC: 35.1 g/dL (ref 31.5–35.7)
MCV: 92 fL (ref 79–97)
Platelets: 195 10*3/uL (ref 150–450)
RBC: 3.36 x10E6/uL — ABNORMAL LOW (ref 3.77–5.28)
RDW: 12.1 % (ref 11.7–15.4)
WBC: 9.3 10*3/uL (ref 3.4–10.8)

## 2019-10-03 LAB — GLUCOSE TOLERANCE, 2 HOURS W/ 1HR
Glucose, 1 hour: 109 mg/dL (ref 65–179)
Glucose, 2 hour: 53 mg/dL — ABNORMAL LOW (ref 65–152)
Glucose, Fasting: 78 mg/dL (ref 65–91)

## 2019-10-03 LAB — HIV ANTIBODY (ROUTINE TESTING W REFLEX): HIV Screen 4th Generation wRfx: NONREACTIVE

## 2019-10-03 LAB — RPR: RPR Ser Ql: NONREACTIVE

## 2019-10-06 ENCOUNTER — Telehealth (HOSPITAL_COMMUNITY): Payer: Self-pay | Admitting: Genetic Counselor

## 2019-10-06 NOTE — Telephone Encounter (Signed)
I called Ms. Gerhart to discuss the results from carrier screening for the father of the pregnancy, Irish Elders. Mr. Mardene Celeste carrier screening was negative for spinal muscular atrophy (SMA) and Bardet-Biedl syndrome, significantly reducing his chances of being a carrier and thus the couple's chance of having a child affected by one of these conditions. 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  Ms. 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 Given Mr. Hawthorne's findings, the couple has a 1 in 4 (25%) chance of having a child with hemoglobin [place] disease (HbSC disease). Individuals with HbSC disease have red blood cells that contain both hemoglobin S and hemoglobin C. These variant forms of hemoglobin can cause red blood cells to become rigid sickle, blocking small blood vessels and making it difficult for the red blood cells to deliver oxygen to the body's tissues. This can cause severe pain and organ damage, just as we see in individuals with sickle cell disease. We reviewed that individuals with HbSC disease are at risk of the same complications as those associated with sickle cell disease, such as pain crises, acute chest syndrome, infections, asplenia, and strokes; however, these complications are often milder and may occur at a lesser frequency.   I reminded Ms. Escandon that she has the option of pursuing an amniocentesis to determine if the current fetus will be affected by HbSC disease prenatally. At her current gestational age, there would be a 1 in 500 (0.2%) chance for complications that could lead to preterm delivery, as she is now within the timeframe of viability. Ms. Carrero declined amniocentesis, indicating that she has been having many pregnancy complications lately and did not want to take any additional risks. I also reminded her that all babies who are born in [place] are screened for various hemoglobinopathies at  the time of birth as a part of newborn screening. Thus, if her child has HbSC disease, it would be detected early in life, allowing early initiation of care plans and treatments. Ms. Obi felt reassured by this.  I offered to meet with Ms. Cosman and Mr. Hawthorne virtually to discuss the implications of these results in more detail. Ms. Freeze was on her way to Mr. Hawthorne's house when I called her. She told me that she would discuss these results with him and that she would be interested in setting up a follow-up consultation when he is available. I also requested that she check with Mr. Hawthorne to see if he has signed the application form for the Patient Assistance Program. I need to receive a copy of the application with his signature in order to ensure that his testing will be free. If he no longer has the application form, we can fill out a new one during our next consultation. Ms. Oehler agreed to contact me once she has had a chance to determine Mr. Hawthorne's availability for a virtual appointment. She also requested additional information about HbSC disease, so I emailed her a link to information from the [facility]. I will schedule a follow-up consultation as soon as I hear back from Ms. Wiedemann.  Gershon Crane, MS, [facility] Genetic Counselor

## 2019-10-29 ENCOUNTER — Telehealth (INDEPENDENT_AMBULATORY_CARE_PROVIDER_SITE_OTHER): Payer: Medicaid Other | Admitting: Obstetrics and Gynecology

## 2019-10-29 ENCOUNTER — Encounter: Payer: Self-pay | Admitting: Obstetrics and Gynecology

## 2019-10-29 DIAGNOSIS — Z3400 Encounter for supervision of normal first pregnancy, unspecified trimester: Secondary | ICD-10-CM

## 2019-10-29 DIAGNOSIS — Z3A32 32 weeks gestation of pregnancy: Secondary | ICD-10-CM

## 2019-10-29 DIAGNOSIS — O2243 Hemorrhoids in pregnancy, third trimester: Secondary | ICD-10-CM | POA: Insufficient documentation

## 2019-10-29 MED ORDER — HYDROCORT-PRAMOXINE (PERIANAL) 1-1 % EX FOAM: 1.0000 | Freq: Two times a day (BID) | CUTANEOUS | 6 refills | Status: DC

## 2019-10-29 NOTE — Patient Instructions (Signed)

## 2019-10-29 NOTE — Progress Notes (Signed)
   MY CHART VIDEO VIRTUAL OBSTETRICS VISIT ENCOUNTER NOTE  I connected with Leslie Yates on 10/29/19 at  9:50 AM EDT by My Chart video at home and verified that I am speaking with the correct person using two identifiers.   I discussed the limitations, risks, security and privacy concerns of performing an evaluation and management service by My Chart video and the availability of in person appointments. I also discussed with the patient that there may be a patient responsible charge related to this service. The patient expressed understanding and agreed to proceed.  Subjective:  Leslie Yates is a 29 y.o. G1P0000 at [redacted]w[redacted]d being followed for ongoing prenatal care.  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  Patient reports increasingly painful hemorrhoids. She has tried Preparation-H and Tucks pads. She has also tried to reduce the hemorrhoids, but they still come back out and are more painful. She reports having difficulty having BMs d/t the pain of the hemorhoid. Reports fetal movement. Denies any contractions, bleeding or leaking of fluid.   The following portions of the patient's history were reviewed and updated as appropriate: allergies, current medications, past family history, past medical history, past social history, past surgical history and problem list.   Objective:   General:  Alert, oriented and cooperative.   Mental Status: Normal mood and affect perceived. Normal judgment and thought content.  Rest of physical exam deferred due to type of encounter  LMP 02/28/2019 (Approximate) Comment: neg preg  test 10/27/2018 **Done by patient's own at home BP cuff and scale  Assessment and Plan:  Pregnancy: G1P0000 at [redacted]w[redacted]d  1. Supervision of normal first pregnancy, antepartum - Anticipatory guidance for GBS and cervical check at nv  2. Hemorrhoids during pregnancy in third trimester  - Rx for hydrocortisone-pramoxine (PROCTOFOAM-HC) rectal foam   Preterm labor symptoms and general obstetric precautions including but not limited to vaginal bleeding, contractions, leaking of fluid and fetal movement were reviewed in detail with the patient.  I discussed the assessment and treatment plan with the patient. The patient was provided an opportunity to ask questions and all were answered. The patient agreed with the plan and demonstrated an understanding of the instructions. The patient was advised to call back or seek an in-person office evaluation/go to [facility] at [facility] for any urgent or concerning symptoms. Please refer to After Visit Summary for other counseling recommendations.   I provided 5 minutes of non-face-to-face time during this encounter. There was 5 minutes of chart review time spent prior to this encounter. Total time spent = 10 minutes.  Return in about 4 weeks (around 11/26/2019) for Return OB w/GBS.  Future Appointments  Date Time Provider Department Center  10/29/2019  9:50 AM [name], CNM [facility] None  11/26/2019 10:50 AM [name], CNM [facility] None    [name], CNM [facility] [employer], [facility]

## 2019-11-11 ENCOUNTER — Other Ambulatory Visit: Payer: Self-pay | Admitting: *Deleted

## 2019-11-11 DIAGNOSIS — O219 Vomiting of pregnancy, unspecified: Secondary | ICD-10-CM

## 2019-11-11 MED ORDER — ONDANSETRON 4 MG PO TBDP: 4.0000 mg | ORAL_TABLET | Freq: Four times a day (QID) | ORAL | 2 refills | Status: DC | PRN

## 2019-11-14 ENCOUNTER — Encounter (HOSPITAL_COMMUNITY): Payer: Self-pay | Admitting: Obstetrics and Gynecology

## 2019-11-14 ENCOUNTER — Other Ambulatory Visit: Payer: Self-pay | Admitting: Student

## 2019-11-14 ENCOUNTER — Inpatient Hospital Stay (HOSPITAL_COMMUNITY)
Admission: AD | Admit: 2019-11-14 | Discharge: 2019-11-14 | Disposition: A | Discharge status: Home / Self Care | Payer: Medicaid Other | Attending: Obstetrics and Gynecology | Admitting: Obstetrics and Gynecology

## 2019-11-14 DIAGNOSIS — Z3A34 34 weeks gestation of pregnancy: Secondary | ICD-10-CM

## 2019-11-14 DIAGNOSIS — F17210 Nicotine dependence, cigarettes, uncomplicated: Secondary | ICD-10-CM | POA: Insufficient documentation

## 2019-11-14 DIAGNOSIS — D573 Sickle-cell trait: Secondary | ICD-10-CM | POA: Insufficient documentation

## 2019-11-14 DIAGNOSIS — O99322 Drug use complicating pregnancy, second trimester: Secondary | ICD-10-CM | POA: Insufficient documentation

## 2019-11-14 DIAGNOSIS — O212 Late vomiting of pregnancy: Secondary | ICD-10-CM | POA: Diagnosis not present

## 2019-11-14 DIAGNOSIS — O99323 Drug use complicating pregnancy, third trimester: Secondary | ICD-10-CM

## 2019-11-14 DIAGNOSIS — J45909 Unspecified asthma, uncomplicated: Secondary | ICD-10-CM | POA: Diagnosis not present

## 2019-11-14 DIAGNOSIS — R1115 Cyclical vomiting syndrome unrelated to migraine: Secondary | ICD-10-CM

## 2019-11-14 DIAGNOSIS — E876 Hypokalemia: Secondary | ICD-10-CM

## 2019-11-14 DIAGNOSIS — R112 Nausea with vomiting, unspecified: Secondary | ICD-10-CM | POA: Diagnosis not present

## 2019-11-14 DIAGNOSIS — O99213 Obesity complicating pregnancy, third trimester: Secondary | ICD-10-CM | POA: Insufficient documentation

## 2019-11-14 DIAGNOSIS — R1111 Vomiting without nausea: Secondary | ICD-10-CM | POA: Diagnosis not present

## 2019-11-14 DIAGNOSIS — O99333 Smoking (tobacco) complicating pregnancy, third trimester: Secondary | ICD-10-CM | POA: Insufficient documentation

## 2019-11-14 DIAGNOSIS — R61 Generalized hyperhidrosis: Secondary | ICD-10-CM | POA: Diagnosis not present

## 2019-11-14 DIAGNOSIS — O99283 Endocrine, nutritional and metabolic diseases complicating pregnancy, third trimester: Secondary | ICD-10-CM | POA: Diagnosis not present

## 2019-11-14 DIAGNOSIS — O99891 Other specified diseases and conditions complicating pregnancy: Secondary | ICD-10-CM | POA: Diagnosis not present

## 2019-11-14 DIAGNOSIS — O99513 Diseases of the respiratory system complicating pregnancy, third trimester: Secondary | ICD-10-CM | POA: Insufficient documentation

## 2019-11-14 DIAGNOSIS — F1290 Cannabis use, unspecified, uncomplicated: Secondary | ICD-10-CM

## 2019-11-14 DIAGNOSIS — R110 Nausea: Secondary | ICD-10-CM | POA: Diagnosis not present

## 2019-11-14 LAB — URINALYSIS, ROUTINE W REFLEX MICROSCOPIC
Bacteria, UA: NONE SEEN
Bilirubin Urine: NEGATIVE
Glucose, UA: >=500 mg/dL — AB
Hgb urine dipstick: NEGATIVE
Ketones, ur: 20 mg/dL — AB
Leukocytes,Ua: NEGATIVE
Nitrite: NEGATIVE
Protein, ur: 30 mg/dL — AB
Specific Gravity, Urine: 1.011 (ref 1.005–1.030)
pH: 7 (ref 5.0–8.0)

## 2019-11-14 LAB — COMPREHENSIVE METABOLIC PANEL
ALT: 12 U/L (ref 0–44)
AST: 15 U/L (ref 15–41)
Albumin: 3.4 g/dL — ABNORMAL LOW (ref 3.5–5.0)
Alkaline Phosphatase: 53 U/L (ref 38–126)
Anion gap: 11 (ref 5–15)
BUN: 6 mg/dL (ref 6–20)
CO2: 20 mmol/L — ABNORMAL LOW (ref 22–32)
Calcium: 9.2 mg/dL (ref 8.9–10.3)
Chloride: 108 mmol/L (ref 98–111)
Creatinine, Ser: 0.71 mg/dL (ref 0.44–1.00)
GFR calc Af Amer: >60 mL/min (ref >60)
GFR calc non Af Amer: >60 mL/min (ref >60)
Glucose, Bld: 112 mg/dL — ABNORMAL HIGH (ref 70–99)
Potassium: 3 mmol/L — ABNORMAL LOW (ref 3.5–5.1)
Sodium: 139 mmol/L (ref 135–145)
Total Bilirubin: 0.8 mg/dL (ref 0.3–1.2)
Total Protein: 6.7 g/dL (ref 6.5–8.1)

## 2019-11-14 LAB — RAPID URINE DRUG SCREEN, HOSP PERFORMED
Amphetamines: NOT DETECTED
Barbiturates: NOT DETECTED
Benzodiazepines: NOT DETECTED
Cocaine: NOT DETECTED
Opiates: NOT DETECTED
Tetrahydrocannabinol: POSITIVE — AB

## 2019-11-14 LAB — CBC
HCT: 31.7 % — ABNORMAL LOW (ref 36.0–46.0)
Hemoglobin: 11.1 g/dL — ABNORMAL LOW (ref 12.0–15.0)
MCH: 31 pg (ref 26.0–34.0)
MCHC: 35 g/dL (ref 30.0–36.0)
MCV: 88.5 fL (ref 80.0–100.0)
Platelets: 199 10*3/uL (ref 150–400)
RBC: 3.58 MIL/uL — ABNORMAL LOW (ref 3.87–5.11)
RDW: 11.7 % (ref 11.5–15.5)
WBC: 8.7 10*3/uL (ref 4.0–10.5)
nRBC: 0 % (ref 0.0–0.2)

## 2019-11-14 MED ORDER — PROMETHAZINE HCL 25 MG/ML IJ SOLN
25.0000 mg | Freq: Once | INTRAMUSCULAR | Status: AC
  Administered 2019-11-14: 25 mg via INTRAVENOUS
  Filled 2019-11-14: qty 1

## 2019-11-14 MED ORDER — DIPHENHYDRAMINE HCL 50 MG/ML IJ SOLN
25.0000 mg | Freq: Once | INTRAMUSCULAR | Status: AC
  Administered 2019-11-14: 25 mg via INTRAVENOUS
  Filled 2019-11-14: qty 1

## 2019-11-14 MED ORDER — HALOPERIDOL LACTATE 5 MG/ML IJ SOLN: 10.0000 mg | Freq: Four times a day (QID) | INTRAMUSCULAR | Status: DC | PRN

## 2019-11-14 MED ORDER — POTASSIUM CHLORIDE CRYS ER 20 MEQ PO TBCR
20.0000 meq | EXTENDED_RELEASE_TABLET | Freq: Two times a day (BID) | ORAL | 0 refills | Status: DC
Start: 2019-11-14 — End: 2019-11-23

## 2019-11-14 MED ORDER — M.V.I. ADULT IV INJ
Freq: Once | INTRAVENOUS | Status: AC
  Filled 2019-11-14: qty 10

## 2019-11-14 MED ORDER — HALOPERIDOL LACTATE 5 MG/ML IJ SOLN
5.0000 mg | Freq: Once | INTRAMUSCULAR | Status: AC
  Administered 2019-11-14: 5 mg via INTRAVENOUS
  Filled 2019-11-14: qty 1

## 2019-11-14 MED ORDER — METHYLPREDNISOLONE SODIUM SUCC 125 MG IJ SOLR
48.0000 mg | Freq: Once | INTRAMUSCULAR | Status: AC
  Administered 2019-11-14: 48 mg via INTRAVENOUS
  Filled 2019-11-14: qty 2

## 2019-11-14 MED ORDER — PANTOPRAZOLE SODIUM 40 MG IV SOLR
40.0000 mg | Freq: Once | INTRAVENOUS | Status: AC
  Administered 2019-11-14: 40 mg via INTRAVENOUS
  Filled 2019-11-14: qty 40

## 2019-11-14 MED ORDER — ENSURE ENLIVE PO LIQD
237.0000 mL | Freq: Once | ORAL | Status: DC
  Filled 2019-11-14: qty 237

## 2019-11-14 MED ORDER — THIAMINE HCL 100 MG/ML IJ SOLN
Freq: Once | INTRAVENOUS | Status: DC
  Filled 2019-11-14: qty 1000

## 2019-11-14 MED ORDER — LACTATED RINGERS IV BOLUS
1000.0000 mL | Freq: Once | INTRAVENOUS | Status: AC
  Administered 2019-11-14: 1000 mL via INTRAVENOUS

## 2019-11-14 MED ORDER — METOCLOPRAMIDE HCL 5 MG/ML IJ SOLN
10.0000 mg | Freq: Once | INTRAMUSCULAR | Status: AC
  Administered 2019-11-14: 10 mg via INTRAVENOUS
  Filled 2019-11-14: qty 2

## 2019-11-14 MED ORDER — SODIUM CHLORIDE 0.9 % IV SOLN
8.0000 mg | Freq: Once | INTRAVENOUS | Status: AC
  Administered 2019-11-14: 8 mg via INTRAVENOUS
  Filled 2019-11-14: qty 4

## 2019-11-14 MED ORDER — SCOPOLAMINE 1 MG/3DAYS TD PT72
1.0000 | MEDICATED_PATCH | TRANSDERMAL | Status: DC
  Administered 2019-11-14: 1.5 mg via TRANSDERMAL
  Filled 2019-11-14: qty 1

## 2019-11-14 MED ORDER — POTASSIUM CHLORIDE CRYS ER 20 MEQ PO TBCR
20.0000 meq | EXTENDED_RELEASE_TABLET | Freq: Once | ORAL | Status: AC
  Administered 2019-11-14: 20 meq via ORAL
  Filled 2019-11-14: qty 1

## 2019-11-14 MED ORDER — GLYCOPYRROLATE 0.2 MG/ML IJ SOLN
0.1000 mg | Freq: Once | INTRAMUSCULAR | Status: AC
  Administered 2019-11-14: 0.1 mg via INTRAVENOUS
  Filled 2019-11-14: qty 1

## 2019-11-14 NOTE — MAU Note (Addendum)
Pt arrived via EMS c/o of N&V throughout pregnancy but worse X 4days, denies bleeding or leaking of fluid, positive FM.

## 2019-11-14 NOTE — MAU Note (Signed)
Patient refusing EFM due to discomfort and nausea/vomitting. Patient says she needs pain relief before she can be on the monitor.

## 2019-11-14 NOTE — Discharge Instructions (Signed)
Cyclic Vomiting Syndrome, Adult Cyclic vomiting syndrome (CVS) is a condition that causes episodes of severe nausea and vomiting. It can last for hours or even days. Attacks may occur several times a month or several times a year. Between episodes of CVS, you may be otherwise healthy. What are the causes? The cause of this condition is not known. Although many of the episodes can happen for no obvious reason, you may have specific CVS triggers. Episodes may be triggered by:  An infection, especially colds and the flu.  Emotional stress, including excitement or anxiety about upcoming events, such as school, parties, or travel.  Certain foods or beverages, such as chocolate, cheese, alcohol, and food additives.  Motion sickness.  Eating a large meal before bed.  Being very tired.  Being too hot. What increases the risk? You are more likely to develop this condition if:  You get migraine headaches.  You have a family history of CVS or migraine headaches. What are the signs or symptoms? Symptoms tend to happen at the same time of day, and each episode tends to last about the same amount of time. Symptoms commonly start at night or when you wake up. Many people have warning signs (prodrome) before an episode, which may include slight nausea, sweating, and pale skin (pallor). The most common symptoms of a CVS attack include:  Severe vomiting. Vomiting may happen every 5-15 minutes.  Severe nausea.  Gagging (retching). Other symptoms may include:  Headache.  Dizziness.  Sensitivity to light.  Extreme thirst.  Abdominal pain. This can be severe.  Loose stools or diarrhea.  Fever.  Pale skin (pallor), especially on the face.  Weakness.  Exhaustion.  Sleepiness after a CVS episode.  Dehydration. This can cause: ? Thirst. ? Dry mouth. ? Decreased urination. ? Fatigue. How is this diagnosed? This condition may be diagnosed based on your symptoms, medical history,  and family history of CVS or migraine. Your health care provider will ask whether you have had:  Episodes of severe nausea and vomiting that have happened a total of 5 or more times, or 3 or more times in the past 6 months.  Episodes that last for 1 hour or more, and occur 1 week apart or farther apart.  Episodes that are similar each time.  Normal health between episodes. Your health care provider will also do a physical exam. To rule out other conditions, you may have tests, such as:  Blood tests.  Urine tests.  Imaging tests. How is this treated? There is no cure for this condition, but treatment can help manage or prevent CVS episodes. Work with your health care provider to find the best treatment for you. Treatment may include:  Avoiding stress and CVS triggers.  Eating smaller, more frequent meals.  Taking medicines, such as: ? Over-the-counter pain medicine. ? Anti-nausea medicines. ? Antacids. ? Antihistamines. ? Medicines for migraines. ? Antidepressants. ? Antibiotics. Severe nausea and vomiting may require you to stay at the hospital. You may need IV fluids to prevent or treat dehydration. Follow these instructions at home: During an episode  Take over-the-counter and prescription medicines only as told by your health care provider.  Stay in bed and rest in a dark, quiet room. After an episode   Drink an oral rehydration solution (ORS), if directed by your health care provider. This is a drink that helps you replace fluids and the salts and minerals in your blood (electrolytes). It can be found at pharmacies and retail stores.    Drink small amounts of clear fluids slowly and gradually add more. ? Drink clear fluids such as water or fruit juice that has water added (is diluted). You may also eat low-calorie popsicles. ? Avoid drinking fluids that contain a lot of sugar or caffeine, such as sports drinks and soda.  Eat soft foods in small amounts every 3-4  hours. Eat your regular diet, but avoid spicy or fatty foods, such as french fries and pizza. General instructions  Monitor your condition for any changes.  If you were prescribed an antibiotic medicine, take it as told by your health care provider. Do not stop taking the antibiotic even if you start to feel better.  Keep track of your attacks and symptoms, and pay attention to any triggers. Avoid those triggers when you can.  Keep all follow-up visits as told by your health care provider. This is important. Contact a health care provider if:  Your condition gets worse.  You cannot drink fluids without vomiting.  You have pain and trouble swallowing after an episode. Get help right away if:  You have blood in your vomit.  Your vomit looks like coffee grounds.  You have stools that are bloody or black, or stools that look like tar.  You have signs of dehydration, such as: ? Sunken eyes. ? Not making tears while crying. ? Very dry mouth. ? Cracked lips. ? Decreased urine production. ? Dark urine. Urine may be the color of tea. ? Weakness. ? Sleepiness. Summary  Cyclic vomiting syndrome (CVS) causes episodes of severe nausea and vomiting that can last for hours or even days.  Vomiting and diarrhea can make you feel weak and can lead to dehydration. If you notice signs of dehydration, call your health care provider right away.  Treatment can help you manage or prevent CVS episodes. Work with your health care provider to find the best treatment for you.  Keep all follow-up visits as told by your health care provider. This is important. This information is not intended to replace advice given to you by your health care provider. Make sure you discuss any questions you have with your health care provider. Document Revised: 03/17/2019 Document Reviewed: 07/27/2016 Elsevier Patient Education  2020 Elsevier Inc.  

## 2019-11-14 NOTE — MAU Provider Note (Addendum)
History     CSN: [id number]  Arrival date and time: 11/14/19 0737   First Provider Initiated Contact with Patient 11/14/19 [phone]      Chief Complaint  Patient presents with  . Emesis   Leslie Yates is a 29 y.o. G1P0 at [redacted]w[redacted]d who receives care at [facility].  She presents today for Emesis.  She states she has been having nausea and vomiting for the past 4 days.  She states she took phenergan on Thursday with no improvement in her symptoms.  She reports taking Zofran yesterday with no improvement in her symptoms.  Patient states she ate "noodles" yesterday "between 8 and 10pm," but was unable to keep them down.  She states she has not been able to drink anything. She also reports onset of diarrhea 3 days ago with about 4 incidents daily.  Patient endorses fetal movement and denies vaginal concerns. She states she has not smoked any MJ and has continued tobacco usage.  Patient states she is unsure of what she was given in the past when she experienced cyclical vomiting with diarrhea prior to pregnancy.  Of note, patient sitting on toilet and actively vomiting/dry heaving while provider at bedside.     OB History    Gravida  1   Para  0   Term  0   Preterm  0   AB  0   Living        SAB  0   TAB  0   Ectopic  0   Multiple      Live Births              Past Medical History:  Diagnosis Date  . Asthma   . Obesity   . Ovarian cyst   . Sickle cell trait [facility])     Past Surgical History:  Procedure Laterality Date  . INCISION AND DRAINAGE ABSCESS      Family History  Problem Relation Age of Onset  . Diabetes Mother   . Hypertension Mother   . Diabetes Father   . Hypertension Father     Social History   Tobacco Use  . Smoking status: Current Every Day Smoker    Packs/day: 0.25    Years: 11.00    Pack years: 2.75    Types: Cigarettes  . Smokeless tobacco: Never Used  Substance Use Topics  . Alcohol use: Not Currently    Comment: occ  . Drug use:  Not Currently    Types: Benzodiazepines, Marijuana    Comment: States she never fill prescription, last time marijuana use yesterday    Allergies:  Allergies  Allergen Reactions  . Penicillins Rash    Childhood rash Has patient had a PCN reaction causing immediate rash, facial/tongue/throat swelling, SOB or lightheadedness with hypotension: no Has patient had a PCN reaction causing severe rash involving mucus membranes or skin necrosis:no Has patient had a PCN reaction that required hospitalization no Has patient had a PCN reaction occurring within the last 10 years: no If all of the above answers are "NO", then may proceed with Cephalosporin use.     No medications prior to admission.    Review of Systems  Constitutional: Negative for chills and fever.  Respiratory: Positive for cough. Negative for shortness of breath.   Gastrointestinal: Positive for abdominal pain, diarrhea, nausea and vomiting.  Genitourinary: Negative for difficulty urinating, dysuria, vaginal bleeding and vaginal discharge.  Neurological: Positive for dizziness and light-headedness. Negative for headaches.   Physical  Exam   Blood pressure (!) 109/45, pulse 60, temperature 98.2 F (36.8 C), temperature source Oral, resp. rate 18, last menstrual period 02/28/2019, SpO2 99 %.  Physical Exam  Constitutional: She is oriented to person, place, and time. She appears well-developed and well-nourished. She appears distressed.  HENT:  Head: Normocephalic and atraumatic.  Eyes: Conjunctivae are normal.  Cardiovascular: Normal rate, regular rhythm and normal heart sounds.  Respiratory: Effort normal and breath sounds normal. No respiratory distress.  GI: Soft. Bowel sounds are normal.  Musculoskeletal:        General: Normal range of motion.     Cervical back: Normal range of motion.  Neurological: She is alert and oriented to person, place, and time.  Skin: Skin is warm and dry.  Psychiatric: She has a normal  mood and affect. Her behavior is normal.    Fetal Assessment 130 by doppler  MAU Course   Results for orders placed or performed during the hospital encounter of 11/14/19 (from the past 24 hour(s))  Urinalysis, Routine w reflex microscopic     Status: Abnormal   Collection Time: 11/14/19  7:53 AM  Result Value Ref Range   Color, Urine YELLOW YELLOW   APPearance CLEAR CLEAR   Specific Gravity, Urine 1.011 1.005 - 1.030   pH 7.0 5.0 - 8.0   Glucose, UA >=500 (A) NEGATIVE mg/dL   Hgb urine dipstick NEGATIVE NEGATIVE   Bilirubin Urine NEGATIVE NEGATIVE   Ketones, ur 20 (A) NEGATIVE mg/dL   Protein, ur 30 (A) NEGATIVE mg/dL   Nitrite NEGATIVE NEGATIVE   Leukocytes,Ua NEGATIVE NEGATIVE   RBC / HPF 0-5 0 - 5 RBC/hpf   WBC, UA 0-5 0 - 5 WBC/hpf   Bacteria, UA NONE SEEN NONE SEEN   Squamous Epithelial / LPF 0-5 0 - 5   Mucus PRESENT   CBC     Status: Abnormal   Collection Time: 11/14/19  7:53 AM  Result Value Ref Range   WBC 8.7 4.0 - 10.5 K/uL   RBC 3.58 (L) 3.87 - 5.11 MIL/uL   Hemoglobin 11.1 (L) 12.0 - 15.0 g/dL   HCT 27.2 (L) 53.6 - 64.4 %   MCV 88.5 80.0 - 100.0 fL   MCH 31.0 26.0 - 34.0 pg   MCHC 35.0 30.0 - 36.0 g/dL   RDW 03.4 74.2 - 59.5 %   Platelets 199 150 - 400 K/uL   nRBC 0.0 0.0 - 0.2 %  Comprehensive metabolic panel     Status: Abnormal   Collection Time: 11/14/19  7:53 AM  Result Value Ref Range   Sodium 139 135 - 145 mmol/L   Potassium 3.0 (L) 3.5 - 5.1 mmol/L   Chloride 108 98 - 111 mmol/L   CO2 20 (L) 22 - 32 mmol/L   Glucose, Bld 112 (H) 70 - 99 mg/dL   BUN 6 6 - 20 mg/dL   Creatinine, Ser 6.38 0.44 - 1.00 mg/dL   Calcium 9.2 8.9 - 75.6 mg/dL   Total Protein 6.7 6.5 - 8.1 g/dL   Albumin 3.4 (L) 3.5 - 5.0 g/dL   AST 15 15 - 41 U/L   ALT 12 0 - 44 U/L   Alkaline Phosphatase 53 38 - 126 U/L   Total Bilirubin 0.8 0.3 - 1.2 mg/dL   GFR calc non Af Amer >60 >60 mL/min   GFR calc Af Amer >60 >60 mL/min   Anion gap 11 5 - 15  Rapid urine drug screen  (hospital performed)  Status: Abnormal   Collection Time: 11/14/19  7:53 AM  Result Value Ref Range   Opiates NONE DETECTED NONE DETECTED   Cocaine NONE DETECTED NONE DETECTED   Benzodiazepines NONE DETECTED NONE DETECTED   Amphetamines NONE DETECTED NONE DETECTED   Tetrahydrocannabinol POSITIVE (A) NONE DETECTED   Barbiturates NONE DETECTED NONE DETECTED   No results found.  MDM PE Labs: CBC, CMP, UA, UDS EFM Start IV LR Bolus f/b MVI Antiemetic PPI Assessment and Plan  29 year old G1P0  SIUP at 34.2weeks Nausea/Vomiting Diarrhea  -Review of charts shows patient was given IV fluids and Zofran with relief when cyclical vomiting occurred prior to pregnancy.  -POC Reviewed. -Exam performed. -Will collect CBC/CMP -Patient informed that UDS would be performed to confirm no drug usage. Patient agreeable.  -Start IV and given LR Bolus -Give phenergan f/b zofran -Give MVI upon completion of LR fluids -Will give ensure for PO challenge once nausea has resolved.  -Will attempt to get NST once patient is comfortable. Doppler [id number] [place] MSN, CNM 11/14/2019, 4:22 PM    Reassessment (4:22 PM) K.Elanor Cale updated on patient status and plan of care. Will assume care.  [name] MSN, CNM Advanced Practice Provider, Center for [facility]   Patient has had Reglan and scop patch; reports that she still feels badly. -Patient had Haldol and Benadryl, feels better. Patient was going home on steroid taper, but now will try IV infusions three times a week with phenergan. Patient is amenable to this plan.   -UDS positive for THC> patient states that she has not been smoking MJ but that it is still present in her urine from when she smoked before.   -NST: 135 bpm, mod var, present acel, no decels, no contractions, NST reactive.   CBC and CMP normal, except with slight hypokalemia.   1. 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 Cyclical vomiting   3. Hypokalemia     2. Discussed with patient the importance of avoiding marijuana as that could be cause of cyclical vomiting. Patient verbalized understanding.  3. Will order outpatient phenergan 3 x week; orders will be placed and note sent.  4. All questions answered; keep appointment on 6/3 at [facility].  5. Return to MAU if she cannot keep down liquids; vaginal bleeding, LOF, decreased fetal movements.   Luna Kitchens

## 2019-11-15 ENCOUNTER — Encounter (HOSPITAL_COMMUNITY): Payer: Self-pay | Admitting: Emergency Medicine

## 2019-11-15 ENCOUNTER — Emergency Department (HOSPITAL_COMMUNITY)
Admission: EM | Admit: 2019-11-15 | Discharge: 2019-11-15 | Disposition: A | Discharge status: Home / Self Care | Payer: Medicaid Other | Attending: Emergency Medicine | Admitting: Emergency Medicine

## 2019-11-15 ENCOUNTER — Encounter (HOSPITAL_COMMUNITY): Payer: Self-pay | Admitting: Obstetrics and Gynecology

## 2019-11-15 ENCOUNTER — Inpatient Hospital Stay (EMERGENCY_DEPARTMENT_HOSPITAL)
Admission: AD | Admit: 2019-11-15 | Discharge: 2019-11-15 | Disposition: A | Discharge status: Home / Self Care | Payer: Medicaid Other | Source: Home / Self Care | Attending: Obstetrics and Gynecology | Admitting: Obstetrics and Gynecology

## 2019-11-15 ENCOUNTER — Other Ambulatory Visit: Payer: Self-pay | Admitting: Student

## 2019-11-15 DIAGNOSIS — O99213 Obesity complicating pregnancy, third trimester: Secondary | ICD-10-CM | POA: Insufficient documentation

## 2019-11-15 DIAGNOSIS — O210 Mild hyperemesis gravidarum: Secondary | ICD-10-CM | POA: Insufficient documentation

## 2019-11-15 DIAGNOSIS — Z3A34 34 weeks gestation of pregnancy: Secondary | ICD-10-CM | POA: Insufficient documentation

## 2019-11-15 DIAGNOSIS — Z880 Allergy status to penicillin: Secondary | ICD-10-CM | POA: Insufficient documentation

## 2019-11-15 DIAGNOSIS — Z833 Family history of diabetes mellitus: Secondary | ICD-10-CM | POA: Insufficient documentation

## 2019-11-15 DIAGNOSIS — R259 Unspecified abnormal involuntary movements: Secondary | ICD-10-CM | POA: Diagnosis not present

## 2019-11-15 DIAGNOSIS — O99210 Obesity complicating pregnancy, unspecified trimester: Secondary | ICD-10-CM

## 2019-11-15 DIAGNOSIS — Z8249 Family history of ischemic heart disease and other diseases of the circulatory system: Secondary | ICD-10-CM | POA: Insufficient documentation

## 2019-11-15 DIAGNOSIS — R112 Nausea with vomiting, unspecified: Secondary | ICD-10-CM | POA: Diagnosis not present

## 2019-11-15 DIAGNOSIS — O26893 Other specified pregnancy related conditions, third trimester: Secondary | ICD-10-CM | POA: Diagnosis not present

## 2019-11-15 DIAGNOSIS — R1084 Generalized abdominal pain: Secondary | ICD-10-CM | POA: Diagnosis not present

## 2019-11-15 DIAGNOSIS — Z3A28 28 weeks gestation of pregnancy: Secondary | ICD-10-CM | POA: Insufficient documentation

## 2019-11-15 DIAGNOSIS — F17210 Nicotine dependence, cigarettes, uncomplicated: Secondary | ICD-10-CM | POA: Insufficient documentation

## 2019-11-15 DIAGNOSIS — D573 Sickle-cell trait: Secondary | ICD-10-CM | POA: Insufficient documentation

## 2019-11-15 DIAGNOSIS — O99333 Smoking (tobacco) complicating pregnancy, third trimester: Secondary | ICD-10-CM | POA: Insufficient documentation

## 2019-11-15 DIAGNOSIS — O212 Late vomiting of pregnancy: Secondary | ICD-10-CM | POA: Insufficient documentation

## 2019-11-15 DIAGNOSIS — E669 Obesity, unspecified: Secondary | ICD-10-CM | POA: Insufficient documentation

## 2019-11-15 DIAGNOSIS — Z5321 Procedure and treatment not carried out due to patient leaving prior to being seen by health care provider: Secondary | ICD-10-CM | POA: Diagnosis not present

## 2019-11-15 DIAGNOSIS — R52 Pain, unspecified: Secondary | ICD-10-CM | POA: Diagnosis not present

## 2019-11-15 DIAGNOSIS — Z3400 Encounter for supervision of normal first pregnancy, unspecified trimester: Secondary | ICD-10-CM

## 2019-11-15 DIAGNOSIS — O99013 Anemia complicating pregnancy, third trimester: Secondary | ICD-10-CM | POA: Insufficient documentation

## 2019-11-15 LAB — COMPREHENSIVE METABOLIC PANEL
ALT: 17 U/L (ref 0–44)
AST: 20 U/L (ref 15–41)
Albumin: 3.1 g/dL — ABNORMAL LOW (ref 3.5–5.0)
Alkaline Phosphatase: 47 U/L (ref 38–126)
Anion gap: 11 (ref 5–15)
BUN: 5 mg/dL — ABNORMAL LOW (ref 6–20)
CO2: 21 mmol/L — ABNORMAL LOW (ref 22–32)
Calcium: 8.6 mg/dL — ABNORMAL LOW (ref 8.9–10.3)
Chloride: 107 mmol/L (ref 98–111)
Creatinine, Ser: 0.81 mg/dL (ref 0.44–1.00)
GFR calc Af Amer: >60 mL/min (ref >60)
GFR calc non Af Amer: >60 mL/min (ref >60)
Glucose, Bld: 120 mg/dL — ABNORMAL HIGH (ref 70–99)
Potassium: 3.2 mmol/L — ABNORMAL LOW (ref 3.5–5.1)
Sodium: 139 mmol/L (ref 135–145)
Total Bilirubin: 0.9 mg/dL (ref 0.3–1.2)
Total Protein: 5.9 g/dL — ABNORMAL LOW (ref 6.5–8.1)

## 2019-11-15 LAB — URINALYSIS, ROUTINE W REFLEX MICROSCOPIC
Bilirubin Urine: NEGATIVE
Glucose, UA: 50 mg/dL — AB
Hgb urine dipstick: NEGATIVE
Ketones, ur: 20 mg/dL — AB
Leukocytes,Ua: NEGATIVE
Nitrite: NEGATIVE
Protein, ur: 100 mg/dL — AB
Specific Gravity, Urine: 1.02 (ref 1.005–1.030)
pH: 6 (ref 5.0–8.0)

## 2019-11-15 MED ORDER — PROMETHAZINE HCL 25 MG RE SUPP
25.0000 mg | Freq: Once | RECTAL | Status: AC
  Administered 2019-11-15: 25 mg via RECTAL
  Filled 2019-11-15: qty 1

## 2019-11-15 MED ORDER — ONDANSETRON 4 MG PO TBDP
8.0000 mg | ORAL_TABLET | Freq: Once | ORAL | Status: AC
  Administered 2019-11-15: 8 mg via ORAL
  Filled 2019-11-15: qty 2

## 2019-11-15 NOTE — MAU Note (Signed)
RN called to patient room. Patient reports that she is tolerating her ice chips and drink. Inquiring if she is going to get discharged home soon. Kooistra CNM in department and made aware. Reports she will go see patient and put in discharge home.

## 2019-11-15 NOTE — ED Triage Notes (Addendum)
Patient here from with complaints of n/v, 7 months pregnant. Reports that she has been seen 4 times for same. Given Zofran, Phenergan, "patches behind the ear", and Reglan with no relief.

## 2019-11-15 NOTE — ED Notes (Signed)
Patient reports that she was not "getting an IV fast enough". Reports that [facility] is going to [facility]. Patient escorted out in wheelchair.

## 2019-11-15 NOTE — MAU Note (Signed)
PT was seen yesterday at MAU for nausea vomiting. States she can not keep down the medication they gave her. Denies contractions, VB or LOF. +FM

## 2019-11-15 NOTE — MAU Provider Note (Signed)
Patient Leslie Yates is a 29 y.o. G1P0000  at [redacted]w[redacted]d here with complaints of nausea and vomiting. She was seen and discharged from the MAU for similar complaint yesterday after an extensive work-up. Patient was counseled on need to take medicines on time as prescribed at her discharge yesterday. She was not able to take her medicine because she was "too sick".   Patient first went to ED today; after complaining about delay in treatment she came to MAU. She reports to RN that she does not think she should have been sent home yesterday; she would like to be admitted. She is requesting an IV.   She denies contractions, vaginal bleeding, vaginal discharge, decreased fetal movements, HA. She denies fever, SOB, abdominal pain.    History     CSN: [id number]  Arrival date and time: 11/15/19 1141   None     Chief Complaint  Patient presents with  . Nausea  . Emesis   Emesis  This is a chronic problem. The current episode started today. The problem occurs 5 to 10 times per day. The problem has been unchanged. There has been no fever. Pertinent negatives include no abdominal pain.    OB History    Gravida  1   Para  0   Term  0   Preterm  0   AB  0   Living        SAB  0   TAB  0   Ectopic  0   Multiple      Live Births              Past Medical History:  Diagnosis Date  . Asthma   . Obesity   . Ovarian cyst   . Sickle cell trait [facility])     Past Surgical History:  Procedure Laterality Date  . INCISION AND DRAINAGE ABSCESS      Family History  Problem Relation Age of Onset  . Diabetes Mother   . Hypertension Mother   . Diabetes Father   . Hypertension Father     Social History   Tobacco Use  . Smoking status: Current Every Day Smoker    Packs/day: 0.25    Years: 11.00    Pack years: 2.75    Types: Cigarettes  . Smokeless tobacco: Never Used  Substance Use Topics  . Alcohol use: Not Currently    Comment: occ  . Drug use: Not Currently   Types: Benzodiazepines, Marijuana    Comment: States she never fill prescription, last time marijuana use yesterday    Allergies:  Allergies  Allergen Reactions  . Penicillins Rash    Childhood rash Has patient had a PCN reaction causing immediate rash, facial/tongue/throat swelling, SOB or lightheadedness with hypotension: no Has patient had a PCN reaction causing severe rash involving mucus membranes or skin necrosis:no Has patient had a PCN reaction that required hospitalization no Has patient had a PCN reaction occurring within the last 10 years: no If all of the above answers are "NO", then may proceed with Cephalosporin use.     No medications prior to admission.    Review of Systems  Constitutional: Negative.   HENT: Negative.   Gastrointestinal: Positive for vomiting. Negative for abdominal pain.  Genitourinary: Negative.   Musculoskeletal: Negative.   Neurological: Negative.   Psychiatric/Behavioral: Negative.    Physical Exam   Blood pressure 139/80, pulse 93, temperature 98.4 F (36.9 C), temperature source Oral, resp. rate 20, last menstrual period 02/28/2019,  SpO2 100 %.  Physical Exam  Constitutional: She is oriented to person, place, and time. She appears well-developed.  HENT:  Head: Normocephalic.  Eyes: Pupils are equal, round, and reactive to light.  Respiratory: Effort normal.  GI: Soft.  Musculoskeletal:        General: Normal range of motion.     Cervical back: Normal range of motion.  Neurological: She is alert and oriented to person, place, and time.  Skin: Skin is warm and dry.    MAU Course  Procedures  MDM Upon arrival in MAU, patient was seen jerking in bed, with seizure-like activity . She has been seen and evaluated for this in the past; she has no received a diagnosis of epilepsy or other seizure disorder.  In this instance, she was responsive to commands and calmed down within moments of provider at the bedside.   Patient refusing  monitoring; refused all monitoring while in MAU.  Patient offered phenergan suppositiy, which she agreed to. Now feels well enough try PO challenge. Requesting grape juice> she was able to tolerate juice, ambulating multiple times around the unit and requesting discharge.    CMP and UA are normal; no other lab work done.   Assessment and Plan   1. Hyperemesis gravidarum   2. Obesity in pregnancy, antepartum   3. Supervision of normal first pregnancy, antepartum    -patient has outpatient order for phenergan infusion; will send message to [facility] to follow up on scheduling.  -Patient and visitor verbalized understanding of the need to take medicine as prescribed and that we plan to treat her outpatient vs. Multiple emergency room visits.   Mervyn Skeeters Lexton Hidalgo 11/15/2019, 7:53 PM

## 2019-11-15 NOTE — MAU Note (Signed)
Patient self-removed EFM monitors and declines to have them reapplied at this time. Kooistra CNM in department and made aware.

## 2019-11-15 NOTE — Discharge Instructions (Signed)
-  take all medicines; if you feel sick, you can insert the pills into your vagina or rectum.

## 2019-11-15 NOTE — MAU Note (Signed)
Kooistra CNM called to bedside. Patient observed to be full body jerking and responsive to commands. Verbalizing "help me. I need to be admitted."

## 2019-11-16 ENCOUNTER — Observation Stay (HOSPITAL_COMMUNITY)
Admission: AD | Admit: 2019-11-16 | Discharge: 2019-11-17 | Disposition: A | Discharge status: Home / Self Care | Payer: Medicaid Other | Attending: Obstetrics and Gynecology | Admitting: Obstetrics and Gynecology

## 2019-11-16 ENCOUNTER — Other Ambulatory Visit: Payer: Self-pay

## 2019-11-16 DIAGNOSIS — O139 Gestational [pregnancy-induced] hypertension without significant proteinuria, unspecified trimester: Secondary | ICD-10-CM

## 2019-11-16 DIAGNOSIS — R112 Nausea with vomiting, unspecified: Secondary | ICD-10-CM | POA: Diagnosis not present

## 2019-11-16 DIAGNOSIS — O133 Gestational [pregnancy-induced] hypertension without significant proteinuria, third trimester: Secondary | ICD-10-CM | POA: Diagnosis not present

## 2019-11-16 DIAGNOSIS — R251 Tremor, unspecified: Secondary | ICD-10-CM

## 2019-11-16 DIAGNOSIS — Z20822 Contact with and (suspected) exposure to covid-19: Secondary | ICD-10-CM | POA: Insufficient documentation

## 2019-11-16 DIAGNOSIS — O99353 Diseases of the nervous system complicating pregnancy, third trimester: Secondary | ICD-10-CM | POA: Diagnosis not present

## 2019-11-16 DIAGNOSIS — R569 Unspecified convulsions: Secondary | ICD-10-CM

## 2019-11-16 DIAGNOSIS — Z3A34 34 weeks gestation of pregnancy: Secondary | ICD-10-CM | POA: Insufficient documentation

## 2019-11-16 DIAGNOSIS — W19XXXA Unspecified fall, initial encounter: Secondary | ICD-10-CM | POA: Diagnosis not present

## 2019-11-16 DIAGNOSIS — R1111 Vomiting without nausea: Secondary | ICD-10-CM | POA: Diagnosis not present

## 2019-11-16 DIAGNOSIS — R110 Nausea: Secondary | ICD-10-CM | POA: Diagnosis not present

## 2019-11-16 LAB — URINALYSIS, ROUTINE W REFLEX MICROSCOPIC
Bilirubin Urine: NEGATIVE
Glucose, UA: 50 mg/dL — AB
Hgb urine dipstick: NEGATIVE
Ketones, ur: 80 mg/dL — AB
Leukocytes,Ua: NEGATIVE
Nitrite: NEGATIVE
Protein, ur: 100 mg/dL — AB
Specific Gravity, Urine: 1.017 (ref 1.005–1.030)
pH: 6 (ref 5.0–8.0)

## 2019-11-16 LAB — RAPID URINE DRUG SCREEN, HOSP PERFORMED
Amphetamines: NOT DETECTED
Barbiturates: NOT DETECTED
Benzodiazepines: POSITIVE — AB
Cocaine: NOT DETECTED
Opiates: NOT DETECTED
Tetrahydrocannabinol: POSITIVE — AB

## 2019-11-16 LAB — BASIC METABOLIC PANEL
Anion gap: 12 (ref 5–15)
BUN: <5 mg/dL — ABNORMAL LOW (ref 6–20)
CO2: 21 mmol/L — ABNORMAL LOW (ref 22–32)
Calcium: 7.7 mg/dL — ABNORMAL LOW (ref 8.9–10.3)
Chloride: 105 mmol/L (ref 98–111)
Creatinine, Ser: 0.7 mg/dL (ref 0.44–1.00)
GFR calc Af Amer: >60 mL/min (ref >60)
GFR calc non Af Amer: >60 mL/min (ref >60)
Glucose, Bld: 106 mg/dL — ABNORMAL HIGH (ref 70–99)
Potassium: 3.3 mmol/L — ABNORMAL LOW (ref 3.5–5.1)
Sodium: 138 mmol/L (ref 135–145)

## 2019-11-16 LAB — CBC
HCT: 27.4 % — ABNORMAL LOW (ref 36.0–46.0)
Hemoglobin: 9.6 g/dL — ABNORMAL LOW (ref 12.0–15.0)
MCH: 31.5 pg (ref 26.0–34.0)
MCHC: 35 g/dL (ref 30.0–36.0)
MCV: 89.8 fL (ref 80.0–100.0)
Platelets: 165 10*3/uL (ref 150–400)
RBC: 3.05 MIL/uL — ABNORMAL LOW (ref 3.87–5.11)
RDW: 11.9 % (ref 11.5–15.5)
WBC: 12.5 10*3/uL — ABNORMAL HIGH (ref 4.0–10.5)
nRBC: 0 % (ref 0.0–0.2)

## 2019-11-16 LAB — COMPREHENSIVE METABOLIC PANEL
ALT: 21 U/L (ref 0–44)
AST: 24 U/L (ref 15–41)
Albumin: 3.1 g/dL — ABNORMAL LOW (ref 3.5–5.0)
Alkaline Phosphatase: 46 U/L (ref 38–126)
Anion gap: 14 (ref 5–15)
BUN: 5 mg/dL — ABNORMAL LOW (ref 6–20)
CO2: 22 mmol/L (ref 22–32)
Calcium: 8.5 mg/dL — ABNORMAL LOW (ref 8.9–10.3)
Chloride: 104 mmol/L (ref 98–111)
Creatinine, Ser: 0.71 mg/dL (ref 0.44–1.00)
GFR calc Af Amer: >60 mL/min (ref >60)
GFR calc non Af Amer: >60 mL/min (ref >60)
Glucose, Bld: 103 mg/dL — ABNORMAL HIGH (ref 70–99)
Potassium: 2.7 mmol/L — CL (ref 3.5–5.1)
Sodium: 140 mmol/L (ref 135–145)
Total Bilirubin: 1 mg/dL (ref 0.3–1.2)
Total Protein: 5.7 g/dL — ABNORMAL LOW (ref 6.5–8.1)

## 2019-11-16 LAB — PROTEIN / CREATININE RATIO, URINE
Creatinine, Urine: 239.87 mg/dL
Protein Creatinine Ratio: 0.18 mg/mg{Cre} — ABNORMAL HIGH (ref 0.00–0.15)
Total Protein, Urine: 44 mg/dL

## 2019-11-16 LAB — ABO/RH: ABO/RH(D): O POS

## 2019-11-16 LAB — LACTIC ACID, PLASMA: Lactic Acid, Venous: 1 mmol/L (ref 0.5–1.9)

## 2019-11-16 LAB — SARS CORONAVIRUS 2 (TAT 6-24 HRS): SARS Coronavirus 2: NEGATIVE

## 2019-11-16 MED ORDER — PROMETHAZINE HCL 25 MG/ML IJ SOLN
25.0000 mg | Freq: Once | INTRAMUSCULAR | Status: AC
  Administered 2019-11-16: 25 mg via INTRAMUSCULAR
  Filled 2019-11-16: qty 1

## 2019-11-16 MED ORDER — SODIUM CHLORIDE 0.9 % IV SOLN
25.0000 mg | Freq: Once | INTRAVENOUS | Status: DC
  Filled 2019-11-16: qty 1

## 2019-11-16 MED ORDER — CALCIUM CARBONATE ANTACID 500 MG PO CHEW: 2.0000 | CHEWABLE_TABLET | ORAL | Status: DC | PRN

## 2019-11-16 MED ORDER — DOCUSATE SODIUM 100 MG PO CAPS: 100.0000 mg | ORAL_CAPSULE | Freq: Every day | ORAL | Status: DC

## 2019-11-16 MED ORDER — PRENATAL MULTIVITAMIN CH: 1.0000 | ORAL_TABLET | Freq: Every day | ORAL | Status: DC

## 2019-11-16 MED ORDER — POTASSIUM CHLORIDE 10 MEQ/100ML IV SOLN
10.0000 meq | INTRAVENOUS | Status: AC
  Administered 2019-11-16 (×2): 10 meq via INTRAVENOUS
  Filled 2019-11-16 (×2): qty 100

## 2019-11-16 MED ORDER — ONDANSETRON HCL 4 MG/2ML IJ SOLN
4.0000 mg | Freq: Four times a day (QID) | INTRAMUSCULAR | Status: DC
  Administered 2019-11-16 – 2019-11-17 (×4): 4 mg via INTRAVENOUS
  Filled 2019-11-16 (×5): qty 2

## 2019-11-16 MED ORDER — LACTATED RINGERS IV SOLN: INTRAVENOUS | Status: DC

## 2019-11-16 MED ORDER — ACETAMINOPHEN 325 MG PO TABS: 650.0000 mg | ORAL_TABLET | ORAL | Status: DC | PRN

## 2019-11-16 MED ORDER — ALUM & MAG HYDROXIDE-SIMETH 200-200-20 MG/5ML PO SUSP
30.0000 mL | Freq: Once | ORAL | Status: AC
  Administered 2019-11-16: 30 mL via ORAL
  Filled 2019-11-16: qty 30

## 2019-11-16 MED ORDER — LIDOCAINE VISCOUS HCL 2 % MT SOLN
15.0000 mL | Freq: Once | OROMUCOSAL | Status: AC
  Administered 2019-11-16: 15 mL via ORAL
  Filled 2019-11-16: qty 15

## 2019-11-16 MED ORDER — MAGNESIUM SULFATE 40 GM/1000ML IV SOLN
2.0000 g/h | INTRAVENOUS | Status: DC
  Administered 2019-11-16: 2 g/h via INTRAVENOUS
  Filled 2019-11-16: qty 1000

## 2019-11-16 MED ORDER — PROMETHAZINE HCL 25 MG/ML IJ SOLN
25.0000 mg | Freq: Four times a day (QID) | INTRAMUSCULAR | Status: DC | PRN
  Administered 2019-11-16 – 2019-11-17 (×4): 25 mg via INTRAVENOUS
  Filled 2019-11-16 (×4): qty 1

## 2019-11-16 NOTE — MAU Note (Signed)
Late entry:  Pt using bathroom when visitor called out to RN station that patient was having a seizure on the toilet.  Seizure lasted less than 1 minute.  Dr Earlene Plater and Venia Carbon, NP called to room.  Pt able to talk with provider, tearful and scared.  Pt helped to wheelchair by staff and got back into bed.  VSS.  Okay per NP for patient to be off EFM for now.

## 2019-11-16 NOTE — Progress Notes (Signed)
Faculty Note  S: Called to room for another seizure by staff. On arrival, patient on floor in bathroom with hands shaking. Pt responsive and able to get off floor with help when asked to.   O: BP (!) 155/105 Comment: pt vomiting  Pulse 94   Temp 98 F (36.7 C) (Oral)   Resp 17   LMP 02/28/2019 (Approximate) Comment: neg preg test 10/27/2018  SpO2 100%   Gen: alert, oriented   A/P: Pt is 29 y.o. G1P0000 @ [redacted]w[redacted]d who is admitted for monitoring of seizure-like activity.  Patient alert and oriented by the time I arrived to unit. Per staff, episode had lasted < 5 minutes and patient had responded to pain stimulus. Patient responsive and able to get up off floor with help when I asked her to. Stated she did not feel good, was noted to have vomited. Neuro PA arrived to scene shortly after I did, EEG had not been hooked up yet so had not captured this episode. Neuro feels this is not true seizure activity.  I reviewed case with Dr. Grace Bushy of MFM who agrees that this is not eclamptic seizure activity, given her normal BP and normal labs. Will discontinue magnesium.  - Cont 24 hr EEG per neuro - Cont routine precautions - continuous fetal monitoring (which patient has been refusing per RN) - cont routine antepartum care   K. Therese Sarah, M.D. Attending Center for [facility])

## 2019-11-16 NOTE — Progress Notes (Signed)
Patient requested to get up to go to bathroom. Patient was advised by RN and tech not safe to get up due to possibility of seizure when out of bed. Patient was offered bedpan. Patient is currently on EEG monitoring. Patient refused and proceeded to get out of bed. RN and tech assisted patient to bathroom and back to bed. RN advised patient to not get out of bed without assistance and that it would be safer to use bed pan. Patient acknowledged request from RN. Bedside commode placed.

## 2019-11-16 NOTE — Progress Notes (Signed)
LTM EEG hooked up and running - no initial skin breakdown - push button tested - neuro notified.  

## 2019-11-16 NOTE — MAU Provider Note (Signed)
Patient brought in by EMS. EMS called to house for N/V. When they arrived, found patient lethargic. EMS personnel went to get stretcher. When EMS returned to room patient was found to be actively seizing and to have fallen off of her bed on to the floor. EMS unsure where she hit when she fell off the bed during her seizure. Patient given midazolam 5mg  IM and 4g magnesium bolus by EMS. Patient post-ictal and minimally responsive on arrival to MAU. Medical screen performed, but unable to complete full evaluation in current patient state.  EFM: Cat I/II       -baseline: 130       -variability: minimal/moderate       -accels: present, 15x15       -decels: absent       -TOCO: single ctx  Patient Vitals for the past 24 hrs:  BP Temp src Pulse SpO2  11/16/19 0700 (!) 142/74 -- 77 94 %  11/16/19 0646 138/76 Oral -- 95 %  11/16/19 0645 -- -- -- 95 %   Preeclampsia labs drawn, admission labs drawn.  Consulted with Dr. 11/18/19 @0704AM , will treat patient as an eclamptic seizure until proven otherwise and requests that provider enter order for 2g/hr Mg until labs are returned.  Patient has history of cyclic vomiting syndrome since 2013 and was seen in MAU on 11/14/2019 and 11/15/2019 for N/V. No history of seizures on her problem list, however, MAU note from yesterday states that patient was seen jerking in bed with seizure-like activity and has been seen and evaluated for this in the past and not given a diagnosis, but patient was responsive to commands yesterday during this time and easily calmed. UDS +THC on 11/14/2019. Outpatient phenergan infusions were also ordered at this visit.  Full note to follow once patient is fully responsive.  Romanda Turrubiates, 11/17/2019, NP  7:19 AM 11/16/2019

## 2019-11-16 NOTE — MAU Provider Note (Addendum)
History     CSN: [id number]  Arrival date and time: 11/16/19 7619   First Provider Initiated Contact with Patient 11/16/19 [phone]      Chief Complaint  Patient presents with  . Emesis  . Nausea  . Seizures   Leslie Yates is a 29 y.o. G1P0000 at [redacted]w[redacted]d who presents to MAU for nausea, vomiting and seizures. Patient reports she called EMS around 4-5AM for nausea and vomiting. Patient endorses history of cyclical vomiting syndrome. Patient reports she called EMS for the vomiting because she could not keep anything down since she left here on 11/14/2019. Patient reports each time she eats or drinks something it "comes right back up again." Patient remembers lying on her bed waiting for EMS to get the stretcher, and then the next thing she remembers is being in the hospital. Originally patient was not able to respond effectively to conversation other than simple, short commands, but since that time patient has been up to the bathroom on her own and is now able to converse effectively. Patient reports she has had a seizure prior in the hospital and reports she had one in MAU yesterday. Patient reports now she feels "the same" but patient is drinking orange juice while talking to provider.  Patient reports in addition to the nausea and vomiting, she is concerned about a 20lb weight loss when compared to the hospital weight and using her home scale this morning. Patient states she is concerned and that this "is not working for her" and she is requesting and induction of labor or an emergency C/S. Patient endorses sore throat from vomiting as well. Patient reports she is surprised that she is not being induced early because of her pregnancy complications which include dark yellow urine. Patient states "she just can't carry on" but denies SI/HI towards herself or the baby.  Patient also endorses decreased fetal movement since this morning. Patient states the baby usually moves a lot between 3-5AM and she  did not feel the baby move much during that time. Patient reports no fetal movement at this time.  Pt denies VB, LOF, ctx, decreased FM, vaginal discharge/odor/itching. Pt denies abdominal pain, constipation, diarrhea, or urinary problems. Pt denies fever, chills, fatigue, sweating or changes in appetite. Pt denies SOB or chest pain. Pt denies dizziness, HA, light-headedness, weakness.  Allergies? PCN Current medications/supplements? PNVs, bASA, Zofran, phenergan Prenatal care provider? [facility]   OB History    Gravida  1   Para  0   Term  0   Preterm  0   AB  0   Living        SAB  0   TAB  0   Ectopic  0   Multiple      Live Births              Past Medical History:  Diagnosis Date  . Asthma   . Obesity   . Ovarian cyst   . Sickle cell trait [facility])     Past Surgical History:  Procedure Laterality Date  . INCISION AND DRAINAGE ABSCESS      Family History  Problem Relation Age of Onset  . Diabetes Mother   . Hypertension Mother   . Diabetes Father   . Hypertension Father     Social History   Tobacco Use  . Smoking status: Current Every Day Smoker    Packs/day: 0.25    Years: 11.00    Pack years: 2.75  Types: Cigarettes  . Smokeless tobacco: Never Used  Substance Use Topics  . Alcohol use: Not Currently    Comment: occ  . Drug use: Not Currently    Types: Benzodiazepines, Marijuana    Comment: States she never fill prescription, last time marijuana use yesterday    Allergies:  Allergies  Allergen Reactions  . Penicillins Rash    Childhood rash Has patient had a PCN reaction causing immediate rash, facial/tongue/throat swelling, SOB or lightheadedness with hypotension: no Has patient had a PCN reaction causing severe rash involving mucus membranes or skin necrosis:no Has patient had a PCN reaction that required hospitalization no Has patient had a PCN reaction occurring within the last 10 years: no If all of the above  answers are "NO", then may proceed with Cephalosporin use.     Medications Prior to Admission  Medication Sig Dispense Refill Last Dose  . acetaminophen (TYLENOL) 325 MG tablet Take 650 mg by mouth every 6 (six) hours as needed for mild pain or headache.   Unknown at Unknown time  . aspirin 81 MG chewable tablet Chew 1 tablet (81 mg total) by mouth daily. 60 tablet 1 Unknown at Unknown time  . Blood Pressure Monitoring (BLOOD PRESSURE MONITOR AUTOMAT) DEVI 1 Device by Does not apply route daily. Automatic blood pressure cuff regular/x-large size cuff. To monitor blood pressure regularly at home. ICD-10 code: O09.90. 1 each 0 Unknown at Unknown time  . hydrocortisone-pramoxine (PROCTOFOAM-HC) rectal foam Place 1 applicator rectally 2 (two) times daily. 10 g 6 Unknown at Unknown time  . Misc. Devices (GOJJI WEIGHT SCALE) MISC 1 each by Does not apply route daily as needed. Patient to weight daily as needed. ICD-10 code: O09.90 1 each 0 Unknown at Unknown time  . ondansetron (ZOFRAN ODT) 4 MG disintegrating tablet Take 1-2 tablets (4-8 mg total) by mouth every 6 (six) hours as needed for nausea or vomiting. For nausea and vomiting not relieved by Phenergan 30 tablet 2 Unknown at Unknown time  . potassium chloride SA (KLOR-CON) 20 MEQ tablet Take 1 tablet (20 mEq total) by mouth 2 (two) times daily. 4 tablet 0 Unknown at Unknown time  . Prenatal Vit-DSS-Fe Fum-FA (PRENATAL 19) 29-1 MG TABS Take 1 tablet by mouth daily. 30 tablet 12 Unknown at Unknown time  . promethazine (PHENERGAN) 25 MG tablet Take 1 tablet (25 mg total) by mouth every 6 (six) hours as needed for nausea or vomiting. 30 tablet 0 Unknown at Unknown time  . scopolamine (TRANSDERM-SCOP, 1.5 MG,) 1 MG/3DAYS Place 1 patch (1.5 mg total) onto the skin every 3 (three) days. 10 patch 12 Unknown at Unknown time    Review of Systems Physical Exam   Blood pressure (!) 145/81, pulse 92, last menstrual period 02/28/2019, SpO2 97 %.  Patient  Vitals for the past 24 hrs:  BP Temp src Pulse SpO2  11/16/19 0800 (!) 145/81 -- 92 --  11/16/19 0750 (!) 141/88 -- 86 --  11/16/19 0730 131/60 -- 86 97 %  11/16/19 0715 (!) 145/81 -- 69 97 %  11/16/19 0700 (!) 142/74 -- 77 94 %  11/16/19 0646 138/76 Oral -- 95 %  11/16/19 0645 -- -- -- 95 %   Physical Exam  MAU Course  Procedures  MDM Care transferred to Noni Saupe, NP Clarisa Fling, NP  8:07 AM 11/16/2019  Dr. Rosana Hoes at bedside to see the patient.  Dr. Rosana Hoes to admit patient and resume care of the patient.   Assessment and  Plan

## 2019-11-16 NOTE — H&P (Signed)
Obstetric History and Physical  Leslie Yates is a 29 y.o. G1P0000 with IUP at [redacted]w[redacted]d presenting for questionable seizure. Pt was at home this am and called EMS for severe nausea/vomiting. When EMS got there, they assessed patient then went to truck to get stretcher. While this was happening, patient's fiancee saw patient on bed and felt she was having a seizure, states pt was shaking and her hands "locked up." She ran for EMS and when she got back, patient was on floor. EMS gave her benzo and brought her to hospital.  Pt denies leaking, bleeding. Reports normal fetal movement. Denies contractions.   Prenatal Course Source of Care: [facility] with onset of care at 12 weeks Pregnancy complications or risks: Patient Active Problem List   Diagnosis Date Noted  . Seizure (HCC) 11/16/2019  . Hemorrhoids during pregnancy in third trimester 10/29/2019  . Hyperemesis gravidarum 08/22/2019  . 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 Carrier of disease 07/01/2019  . Sickle cell trait (HCC)   . Obesity in pregnancy, antepartum 06/12/2019  . Lump of breast 06/12/2019  . Supervision of normal first pregnancy, antepartum 06/01/2019  . Tobacco use 10/28/2018  . Cyclical vomiting with nausea 10/27/2018  . Asthma 10/27/2018  . Complex cyst of right ovary 05/10/2017   Prenatal labs and studies: ABO, Rh: --/--/PENDING (05/24 8115) Antibody: PENDING (05/24 0836) Rubella: 1.74 (12/18 1029) RPR: Non Reactive (04/09 0819)  HBsAg: Negative (12/18 1029)  HIV: Non Reactive (04/09 0819)  GBS:  2 hr GTT:  wnl Genetic screening normal Anatomy US normal  Medical History:  Past Medical History:  Diagnosis Date  . Asthma   . Obesity   . Ovarian cyst   . Sickle cell trait [facility])     Past Surgical History:  Procedure Laterality Date  . INCISION AND DRAINAGE ABSCESS      OB History  Gravida Para Term Preterm AB Living  1 0 0 0 0    SAB TAB Ectopic  Multiple Live Births  0 0 0        # Outcome Date GA Lbr Len/2nd Weight Sex Delivery Anes PTL Lv  1 Current             Social History   Socioeconomic History  . Marital status: Single    Spouse name: Not on file  . Number of children: Not on file  . Years of education: Not on file  . Highest education level: Some college, no degree  Occupational History  . Not on file  Tobacco Use  . Smoking status: Current Every Day Smoker    Packs/day: 0.25    Years: 11.00    Pack years: 2.75    Types: Cigarettes  . Smokeless tobacco: Never Used  Substance and Sexual Activity  . Alcohol use: Not Currently    Comment: occ  . Drug use: Not Currently    Types: Benzodiazepines, Marijuana    Comment: States she never fill prescription, last time marijuana use yesterday  . Sexual activity: Yes    Birth control/protection: None  Other Topics Concern  . Not on file  Social History Narrative   ** Merged History Encounter **       ** Merged History Encounter ** Patient is having trouble with financial issues.   Social Determinants of Health   Financial Resource Strain: High Risk  . Difficulty of Paying Living Expenses: Hard  Food Insecurity: Food Insecurity Present  . Worried About  Running Out of Food in the Last Year: Sometimes true  . Ran Out of Food in the Last Year: Sometimes true  Transportation Needs: No Transportation Needs  . Lack of Transportation (Medical): No  . Lack of Transportation (Non-Medical): No  Physical Activity: Inactive  . Days of Exercise per Week: 0 days  . Minutes of Exercise per Session: 0 min  Stress: No Stress Concern Present  . Feeling of Stress : Not at all  Social Connections: Moderately Isolated  . Frequency of Communication with Friends and Family: More than three times a week  . Frequency of Social Gatherings with Friends and Family: More than three times a week  . Attends Religious Services: Never  . Active Member of Clubs or Organizations: No  .  Attends Archivist Meetings: Never  . Marital Status: Never married    Family History  Problem Relation Age of Onset  . Diabetes Mother   . Hypertension Mother   . Diabetes Father   . Hypertension Father     Medications Prior to Admission  Medication Sig Dispense Refill Last Dose  . acetaminophen (TYLENOL) 325 MG tablet Take 650 mg by mouth every 6 (six) hours as needed for mild pain or headache.   Unknown at Unknown time  . aspirin 81 MG chewable tablet Chew 1 tablet (81 mg total) by mouth daily. 60 tablet 1 Unknown at Unknown time  . Blood Pressure Monitoring (BLOOD PRESSURE MONITOR AUTOMAT) DEVI 1 Device by Does not apply route daily. Automatic blood pressure cuff regular/x-large size cuff. To monitor blood pressure regularly at home. ICD-10 code: O09.90. 1 each 0 Unknown at Unknown time  . hydrocortisone-pramoxine (PROCTOFOAM-HC) rectal foam Place 1 applicator rectally 2 (two) times daily. 10 g 6 Unknown at Unknown time  . Misc. Devices (GOJJI WEIGHT SCALE) MISC 1 each by Does not apply route daily as needed. Patient to weight daily as needed. ICD-10 code: O09.90 1 each 0 Unknown at Unknown time  . ondansetron (ZOFRAN ODT) 4 MG disintegrating tablet Take 1-2 tablets (4-8 mg total) by mouth every 6 (six) hours as needed for nausea or vomiting. For nausea and vomiting not relieved by Phenergan 30 tablet 2 Unknown at Unknown time  . potassium chloride SA (KLOR-CON) 20 MEQ tablet Take 1 tablet (20 mEq total) by mouth 2 (two) times daily. 4 tablet 0 Unknown at Unknown time  . Prenatal Vit-DSS-Fe Fum-FA (PRENATAL 19) 29-1 MG TABS Take 1 tablet by mouth daily. 30 tablet 12 Unknown at Unknown time  . promethazine (PHENERGAN) 25 MG tablet Take 1 tablet (25 mg total) by mouth every 6 (six) hours as needed for nausea or vomiting. 30 tablet 0 Unknown at Unknown time  . scopolamine (TRANSDERM-SCOP, 1.5 MG,) 1 MG/3DAYS Place 1 patch (1.5 mg total) onto the skin every 3 (three) days. 10 patch  12 Unknown at Unknown time    Allergies  Allergen Reactions  . Penicillins Rash    Childhood rash Has patient had a PCN reaction causing immediate rash, facial/tongue/throat swelling, SOB or lightheadedness with hypotension: no Has patient had a PCN reaction causing severe rash involving mucus membranes or skin necrosis:no Has patient had a PCN reaction that required hospitalization no Has patient had a PCN reaction occurring within the last 10 years: no If all of the above answers are "NO", then may proceed with Cephalosporin use.     Review of Systems: Negative except for what is mentioned in HPI.  Physical Exam: BP 98/69  Pulse 84   LMP 02/28/2019 (Approximate) Comment: neg preg test 10/27/2018  SpO2 100%  CONSTITUTIONAL: Well-developed, well-nourished female in no acute distress.  HENT:  Normocephalic, atraumatic, External right and left ear normal. Oropharynx is clear and moist EYES: Conjunctivae and EOM are normal. Pupils are equal, round, and reactive to light. No scleral icterus.  NECK: Normal range of motion, supple, no masses SKIN: Skin is warm and dry. No rash noted. Not diaphoretic. No erythema. No pallor. NEUROLOGIC: Alert and oriented to person, place, and time. Normal reflexes, muscle tone coordination. No cranial nerve deficit noted. PSYCHIATRIC: Normal mood and affect. Normal behavior. Normal judgment and thought content. CARDIOVASCULAR: Normal heart rate noted, regular rhythm RESPIRATORY: Effort and breath sounds normal, no problems with respiration noted ABDOMEN: Soft, nontender, nondistended, gravid. MUSCULOSKELETAL: Normal range of motion. No edema and no tenderness. 2+ distal pulses.  Cervical Exam: deferred FHT:  Baseline rate 120 bpm   Variability moderate  Accelerations present   Decelerations none Contractions: NONE   Pertinent Labs/Studies:   Results for orders placed or performed during the hospital encounter of 11/16/19 (from the past 24 hour(s))    CBC     Status: Abnormal   Collection Time: 11/16/19  7:13 AM  Result Value Ref Range   WBC 12.5 (H) 4.0 - 10.5 K/uL   RBC 3.05 (L) 3.87 - 5.11 MIL/uL   Hemoglobin 9.6 (L) 12.0 - 15.0 g/dL   HCT 44.8 (L) 18.5 - 63.1 %   MCV 89.8 80.0 - 100.0 fL   MCH 31.5 26.0 - 34.0 pg   MCHC 35.0 30.0 - 36.0 g/dL   RDW 49.7 02.6 - 37.8 %   Platelets 165 150 - 400 K/uL   nRBC 0.0 0.0 - 0.2 %  Comprehensive metabolic panel     Status: Abnormal   Collection Time: 11/16/19  7:13 AM  Result Value Ref Range   Sodium 140 135 - 145 mmol/L   Potassium 2.7 (LL) 3.5 - 5.1 mmol/L   Chloride 104 98 - 111 mmol/L   CO2 22 22 - 32 mmol/L   Glucose, Bld 103 (H) 70 - 99 mg/dL   BUN 5 (L) 6 - 20 mg/dL   Creatinine, Ser 5.88 0.44 - 1.00 mg/dL   Calcium 8.5 (L) 8.9 - 10.3 mg/dL   Total Protein 5.7 (L) 6.5 - 8.1 g/dL   Albumin 3.1 (L) 3.5 - 5.0 g/dL   AST 24 15 - 41 U/L   ALT 21 0 - 44 U/L   Alkaline Phosphatase 46 38 - 126 U/L   Total Bilirubin 1.0 0.3 - 1.2 mg/dL   GFR calc non Af Amer >60 >60 mL/min   GFR calc Af Amer >60 >60 mL/min   Anion gap 14 5 - 15  Protein / creatinine ratio, urine     Status: Abnormal   Collection Time: 11/16/19  7:38 AM  Result Value Ref Range   Creatinine, Urine 239.87 mg/dL   Total Protein, Urine 44 mg/dL   Protein Creatinine Ratio 0.18 (H) 0.00 - 0.15 mg/mg[Cre]  Urine rapid drug screen (hosp performed)     Status: Abnormal   Collection Time: 11/16/19  7:38 AM  Result Value Ref Range   Opiates NONE DETECTED NONE DETECTED   Cocaine NONE DETECTED NONE DETECTED   Benzodiazepines POSITIVE (A) NONE DETECTED   Amphetamines NONE DETECTED NONE DETECTED   Tetrahydrocannabinol POSITIVE (A) NONE DETECTED   Barbiturates NONE DETECTED NONE DETECTED  Urinalysis, Routine w reflex microscopic  Status: Abnormal   Collection Time: 11/16/19  7:38 AM  Result Value Ref Range   Color, Urine YELLOW YELLOW   APPearance CLEAR CLEAR   Specific Gravity, Urine 1.017 1.005 - 1.030   pH 6.0  5.0 - 8.0   Glucose, UA 50 (A) NEGATIVE mg/dL   Hgb urine dipstick NEGATIVE NEGATIVE   Bilirubin Urine NEGATIVE NEGATIVE   Ketones, ur 80 (A) NEGATIVE mg/dL   Protein, ur 326 (A) NEGATIVE mg/dL   Nitrite NEGATIVE NEGATIVE   Leukocytes,Ua NEGATIVE NEGATIVE   RBC / HPF 0-5 0 - 5 RBC/hpf   WBC, UA 0-5 0 - 5 WBC/hpf   Bacteria, UA FEW (A) NONE SEEN   Squamous Epithelial / LPF 0-5 0 - 5   Mucus PRESENT   Lactic acid, plasma     Status: None   Collection Time: 11/16/19  8:36 AM  Result Value Ref Range   Lactic Acid, Venous 1.0 0.5 - 1.9 mmol/L  Type and screen [facility]     Status: None (Preliminary result)   Collection Time: 11/16/19  8:36 AM  Result Value Ref Range   ABO/RH(D) PENDING    Antibody Screen PENDING    Sample Expiration      11/19/2019,2359 Performed at [facility], [address], [place], [place] [address]     Assessment : Leslie Yates is a 29 y.o. G1P0000 at [redacted]w[redacted]d being admitted for questionable history of seizure at home. Pt's fiancee reported she saw pt shaking and hands "locked up". Pt has history of possible seizure during pregnancy, was seen by Neurology with negative MRI and EEG at that time. Also has extensive history of hyperemesis, felt to be secondary to cannabis use in pregnancy. Plan for admission for repletion of hypokalemia and observation.   While in MAU, patient had another witnessed episode, where she was shaking, with hands locked in open position, pt unable to answer briefly, but was blinking eyes and completely responsive by about a minute. She is currently on magnesium and did not get any additional medication.    Plan:  Questionable seizure - BP normal, labs normal which makes eclampsia less likely - will admit to [facility] - will get Neurology consult  FWB - cont monitoring for now   K. Therese Sarah, M.D. Attending [facility] [employer] (Faculty Practice)  11/16/2019, 11:22 AM

## 2019-11-16 NOTE — Consult Note (Addendum)
Neurology Consultation  Reason for Consult: Seizure-like activity Referring Physician: Conan Bowens, MD  CC: 2 seizure-like events  History is obtained from: Fianc and also hospital staff  HPI: Leslie Yates is a 29 y.o. female with history of sickle cell trait, ovarian cyst, obesity and asthma.  Currently patient is 34 weeks and 4 days.  Per fianc she was at home when she had a 5-minute seizure-like activity.  This was described as bilateral arms held out and fingers clenched with arms held out.  Eyes were open.  Patient was nonresponsive to voice.  EMS was called.  As patient was placed on the stretcher it took approximately 1 minute for her to come back to her normal baseline.  Patient was brought to [facility] and placed in room.  While in the room fianc states that she had gotten up to go to the bathroom.  While she was on the toilet patient had another seizure like episode which was not as long and also returned to baseline immediately.  Patient does not recall either event.  There was no bowel or bladder incontinence during this event.  There also was no lacerations or tongue biting.  Currently patient is drowsy however she has received Phenergan and also magnesium.  Currently she has no complaints.  No history of seizure in the family, she does not report any psychosocial stressors, she does not report any head trauma, her fianc states that she is slightly stressed with the pregnancy.  ED course  Relevant labs include -positive for THC, potassium 2.7, BUN less than 5  Chart review patient was seen back on 08/22/2019.  At that time patient had a.  Of unresponsiveness to which EMS reported included shaking all over the body which stopped when they asked her to stop.  She apparently had another seizure that lasted 5 minutes with reported right arm clenched during the time.  Notable bladder bowel incontinence.  At that time EEG was obtained.  The study was within normal limits.   There was no seizure or epileptiform discharges seen throughout the recording.  MRI was also within normal limits.  During the hospital visit given it was a first-time seizure with overlay of stressors the decision was to not start any antiepileptics.   Past Medical History:  Diagnosis Date  . Asthma   . Obesity   . Ovarian cyst   . Sickle cell trait (HCC)    Family History  Problem Relation Age of Onset  . Diabetes Mother   . Hypertension Mother   . Diabetes Father   . Hypertension Father    Social History:   reports that she has been smoking cigarettes. She has a 2.75 pack-year smoking history. She has never used smokeless tobacco. She reports previous alcohol use. She reports previous drug use. Drugs: Benzodiazepines and Marijuana.  Medications  Current Facility-Administered Medications:  .  acetaminophen (TYLENOL) tablet 650 mg, 650 mg, Oral, Q4H PRN, Conan Bowens, MD .  calcium carbonate (TUMS - dosed in mg elemental calcium) chewable tablet 400 mg of elemental calcium, 2 tablet, Oral, Q4H PRN, Conan Bowens, MD .  docusate sodium (COLACE) capsule 100 mg, 100 mg, Oral, Daily, Conan Bowens, MD, Stopped at 11/16/19 1148 .  lactated ringers infusion, , Intravenous, Continuous, Nugent, Odie Sera, NP, Last Rate: 75 mL/hr at 11/16/19 0750, New Bag at 11/16/19 0750 .  magnesium sulfate 40 grams in SWI 1000 mL OB infusion, 2 g/hr, Intravenous, Continuous, Nugent, Odie Sera,  NP, Last Rate: 50 mL/hr at 11/16/19 0752, 2 g/hr at 11/16/19 0752 .  potassium chloride 10 mEq in 100 mL IVPB, 10 mEq, Intravenous, Q1 Hr x 2, Rasch, Jennifer I, NP, Last Rate: 100 mL/hr at 11/16/19 1152, 10 mEq at 11/16/19 1152 .  prenatal multivitamin tablet 1 tablet, 1 tablet, Oral, Q1200, Sloan Leiter, MD, Stopped at 11/16/19 1148  ROS:   General ROS: Positive for -  weight loss Psychological ROS: negative for - behavioral disorder, hallucinations, memory difficulties, mood swings or suicidal  ideation Ophthalmic ROS: negative for - blurry vision, double vision, eye pain or loss of vision ENT ROS: negative for - epistaxis, nasal discharge, oral lesions, sore throat, tinnitus or vertigo Allergy and Immunology ROS: negative for - hives or itchy/watery eyes Hematological and Lymphatic ROS: negative for - bleeding problems, bruising or swollen lymph nodes Endocrine ROS: negative for - galactorrhea, hair pattern changes, polydipsia/polyuria or temperature intolerance Respiratory ROS: negative for - cough, hemoptysis, shortness of breath or wheezing Cardiovascular ROS: negative for - chest pain, dyspnea on exertion, edema or irregular heartbeat Gastrointestinal ROS: Positive for -nausea/vomiting Genito-Urinary ROS: negative for - dysuria, hematuria, incontinence or urinary frequency/urgency Musculoskeletal ROS: negative for - joint swelling or muscular weakness Neurological ROS: as noted in HPI Dermatological ROS: negative for rash and skin lesion changes  Exam: Current vital signs: BP (!) 149/85 (BP Location: Right Arm)   Pulse 68   Resp 17   LMP 02/28/2019 (Approximate) Comment: neg preg test 10/27/2018  SpO2 92%  Vital signs in last 24 hours: Pulse Rate:  [61-92] 68 (05/24 1155) Resp:  [17-20] 17 (05/24 1155) BP: (98-149)/(59-88) 149/85 (05/24 1155) SpO2:  [92 %-100 %] 92 % (05/24 1155)   Constitutional: Appears well-developed and well-nourished.  Psych: Affect appropriate to situation Eyes: No scleral injection HENT: No OP obstrucion Head: Normocephalic.  Cardiovascular: Normal rate and regular rhythm.  Respiratory: Effort normal, non-labored breathing GI: Soft.  No distension. There is no tenderness.  Skin: WDI  Neuro: Mental Status: Patient is awake, alert, oriented to person, place, month, year, and situation.  She has no dysarthria, aphasia, speech had no difficulties with naming, repeating, comprehension.  Patient was able to follow all commands with no  difficulties. Cranial Nerves: II: Visual Fields are full.  III,IV, VI: EOMI without ptosis or diploplia. Pupils equal, round and reactive to light V: Facial sensation is symmetric to temperature VII: Facial movement is symmetric.  VIII: hearing is intact to voice X: Palat elevates symmetrically XI: Shoulder shrug is symmetric. XII: tongue is midline without atrophy or fasciculations.  Motor: Tone is normal. Bulk is normal. 5/5 strength was present in all four extremities.  Sensory: Sensation is symmetric to light touch and temperature in the arms and legs. Deep Tendon Reflexes: 2+ and symmetric in the biceps and patellae.  Plantars: Toes are downgoing bilaterally.  Cerebellar: FNF and HKS are intact bilaterally  Labs I have reviewed labs in epic and the results pertinent to this consultation are:   CBC    Component Value Date/Time   WBC 12.5 (H) 11/16/2019 0713   RBC 3.05 (L) 11/16/2019 0713   HGB 9.6 (L) 11/16/2019 0713   HGB 10.8 (L) 10/02/2019 0819   HCT 27.4 (L) 11/16/2019 0713   HCT 30.8 (L) 10/02/2019 0819   PLT 165 11/16/2019 0713   PLT 195 10/02/2019 0819   MCV 89.8 11/16/2019 0713   MCV 92 10/02/2019 0819   MCH 31.5 11/16/2019 0713   MCHC 35.0  11/16/2019 0713   RDW 11.9 11/16/2019 0713   RDW 12.1 10/02/2019 0819   LYMPHSABS 1.1 08/21/2019 0430   LYMPHSABS 1.4 06/12/2019 1029   MONOABS 0.9 08/21/2019 0430   EOSABS 0.0 08/21/2019 0430   EOSABS 0.1 06/12/2019 1029   BASOSABS 0.0 08/21/2019 0430   BASOSABS 0.0 06/12/2019 1029    CMP     Component Value Date/Time   NA 140 11/16/2019 0713   NA 144 08/19/2018 1624   K 2.7 (LL) 11/16/2019 0713   CL 104 11/16/2019 0713   CO2 22 11/16/2019 0713   GLUCOSE 103 (H) 11/16/2019 0713   BUN 5 (L) 11/16/2019 0713   BUN 8 08/19/2018 1624   CREATININE 0.71 11/16/2019 0713   CALCIUM 8.5 (L) 11/16/2019 0713   PROT 5.7 (L) 11/16/2019 0713   PROT 5.6 (L) 08/19/2018 1624   ALBUMIN 3.1 (L) 11/16/2019 0713   ALBUMIN  3.8 (L) 08/19/2018 1624   AST 24 11/16/2019 0713   ALT 21 11/16/2019 0713   ALKPHOS 46 11/16/2019 0713   BILITOT 1.0 11/16/2019 0713   BILITOT 0.3 08/19/2018 1624   GFRNONAA >60 11/16/2019 0713   GFRAA >60 11/16/2019 0713     Felicie Morn PA-C [facility] [phone]  M-F  (9:00 am- 5:00 PM)  11/16/2019, 12:21 PM    Assessment:  This is a 29 year old female who is [redacted] weeks pregnant presenting to the hospital with seizure-like activity at home and then second seizure-like activity while in the hospital.  Both events did not have any significant postictal state in fact the second event had no postictal phase.  Patient has recently been seen in the hospital for similar issues with negative EEG and negative MRI.  At this point differential includes seizure versus psychogenic nonepileptic seizures.  Impression: Spells concerning for seizures  Recommendations: -Seizure precautions -LTM -We will hold off on antiepileptic medication pending reading of long-term EEG.  Addendum. At approximately 330 I was called to bedside as patient had just had another episode.  Apparently she was throwing up and then suddenly started clenching her hands and unresponsive.  Nurse at bedside caused significant discomfort on her finger and patient screamed out and came out of seizure episode.  Unfortunately at that time patient had not yet been hooked up to EEG.  We will continue to obtain LTM to further evaluate seizure episodes.      NEUROHOSPITALIST ADDENDUM Performed a face to face diagnostic evaluation.   I have reviewed the contents of history and physical exam as documented by PA/ARNP/Resident and agree with above documentation.  I have discussed and formulated the above plan as documented. Edits to the note have been made as needed.  29 year old female with sickle cell trait, hyperemesis gravidarum presents to the emergency department after having weakness shaking spells lasting for  a few minutes into 2 times.  Per description, she has whole body shaking however at times is awake and blinks during these episodes. She was evaluated in February-at that time had an MRI brain which was normal and EEG which was also within normal limits.  Description of spell sounds more likely to be nonepileptic event.  Given that patient has had 2 spells, as well as a further spell while in the hospital-plan obtaining 24-hour EEG as there is a high likelihood of being able to capture spell.   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

## 2019-11-16 NOTE — MAU Note (Signed)
..  CRITICAL VALUE STICKER  CRITICAL VALUE: K+ 2.7  RECEIVER (on-site recipient of call): Alexx Gagliardo, RN  DATE & TIME NOTIFIED:   MESSENGER (representative from lab):  MD NOTIFIED: Venia Carbon, NP  TIME OF NOTIFICATION: 0902  RESPONSE: Orders received

## 2019-11-16 NOTE — MAU Note (Signed)
Unable to get adequate information from patient due to her being asleep and in a post ictal state.

## 2019-11-16 NOTE — MAU Note (Signed)
Pt reports to MAU via EMS. EMS states upon arrival to the patients residences the patient was c/o [place] [facility] states she was very lethargic so when they went to go get the stretcher and came back the patient had fallen out of bed and had a seizure. Patient was given 4grams of MAG on the truck as well as 5mg  of midazolam. Pt has been very lethargic with as well.

## 2019-11-17 ENCOUNTER — Observation Stay (HOSPITAL_BASED_OUTPATIENT_CLINIC_OR_DEPARTMENT_OTHER): Payer: Medicaid Other

## 2019-11-17 DIAGNOSIS — O139 Gestational [pregnancy-induced] hypertension without significant proteinuria, unspecified trimester: Secondary | ICD-10-CM | POA: Diagnosis not present

## 2019-11-17 DIAGNOSIS — Z3A34 34 weeks gestation of pregnancy: Secondary | ICD-10-CM

## 2019-11-17 DIAGNOSIS — O2693 Pregnancy related conditions, unspecified, third trimester: Secondary | ICD-10-CM

## 2019-11-17 DIAGNOSIS — O133 Gestational [pregnancy-induced] hypertension without significant proteinuria, third trimester: Secondary | ICD-10-CM | POA: Diagnosis not present

## 2019-11-17 DIAGNOSIS — R569 Unspecified convulsions: Secondary | ICD-10-CM | POA: Diagnosis not present

## 2019-11-17 DIAGNOSIS — O99353 Diseases of the nervous system complicating pregnancy, third trimester: Secondary | ICD-10-CM | POA: Diagnosis not present

## 2019-11-17 HISTORY — DX: Gestational (pregnancy-induced) hypertension without significant proteinuria, unspecified trimester: O13.9

## 2019-11-17 LAB — COMPREHENSIVE METABOLIC PANEL
ALT: 55 U/L — ABNORMAL HIGH (ref 0–44)
AST: 57 U/L — ABNORMAL HIGH (ref 15–41)
Albumin: 3.3 g/dL — ABNORMAL LOW (ref 3.5–5.0)
Alkaline Phosphatase: 57 U/L (ref 38–126)
Anion gap: 11 (ref 5–15)
BUN: <5 mg/dL — ABNORMAL LOW (ref 6–20)
CO2: 21 mmol/L — ABNORMAL LOW (ref 22–32)
Calcium: 8.5 mg/dL — ABNORMAL LOW (ref 8.9–10.3)
Chloride: 106 mmol/L (ref 98–111)
Creatinine, Ser: 0.72 mg/dL (ref 0.44–1.00)
GFR calc Af Amer: >60 mL/min (ref >60)
GFR calc non Af Amer: >60 mL/min (ref >60)
Glucose, Bld: 109 mg/dL — ABNORMAL HIGH (ref 70–99)
Potassium: 3.6 mmol/L (ref 3.5–5.1)
Sodium: 138 mmol/L (ref 135–145)
Total Bilirubin: 1.2 mg/dL (ref 0.3–1.2)
Total Protein: 6.2 g/dL — ABNORMAL LOW (ref 6.5–8.1)

## 2019-11-17 LAB — BASIC METABOLIC PANEL
Anion gap: 6 (ref 5–15)
BUN: <5 mg/dL — ABNORMAL LOW (ref 6–20)
CO2: 22 mmol/L (ref 22–32)
Calcium: 7.5 mg/dL — ABNORMAL LOW (ref 8.9–10.3)
Chloride: 108 mmol/L (ref 98–111)
Creatinine, Ser: 0.62 mg/dL (ref 0.44–1.00)
GFR calc Af Amer: >60 mL/min (ref >60)
GFR calc non Af Amer: >60 mL/min (ref >60)
Glucose, Bld: 94 mg/dL (ref 70–99)
Potassium: 3.2 mmol/L — ABNORMAL LOW (ref 3.5–5.1)
Sodium: 136 mmol/L (ref 135–145)

## 2019-11-17 LAB — CBC
HCT: 32.7 % — ABNORMAL LOW (ref 36.0–46.0)
Hemoglobin: 11.6 g/dL — ABNORMAL LOW (ref 12.0–15.0)
MCH: 31.9 pg (ref 26.0–34.0)
MCHC: 35.5 g/dL (ref 30.0–36.0)
MCV: 89.8 fL (ref 80.0–100.0)
Platelets: 212 10*3/uL (ref 150–400)
RBC: 3.64 MIL/uL — ABNORMAL LOW (ref 3.87–5.11)
RDW: 11.8 % (ref 11.5–15.5)
WBC: 10.4 10*3/uL (ref 4.0–10.5)
nRBC: 0 % (ref 0.0–0.2)

## 2019-11-17 LAB — PROLACTIN: Prolactin: 163 ng/mL — ABNORMAL HIGH (ref 4.8–23.3)

## 2019-11-17 MED ORDER — POTASSIUM CHLORIDE CRYS ER 20 MEQ PO TBCR: 40.0000 meq | EXTENDED_RELEASE_TABLET | Freq: Two times a day (BID) | ORAL | Status: DC

## 2019-11-17 NOTE — Progress Notes (Signed)
FACULTY PRACTICE ANTEPARTUM PROGRESS NOTE  Leslie Yates is a 29 y.o. G1P0000 at [redacted]w[redacted]d who is admitted for hyperemesis with questionable seizure episodes.  Estimated Date of Delivery: 12/24/19 Fetal presentation is unsure.  Length of Stay:  0 Days. Admitted 11/16/2019  Subjective:  Patient reports she has not felt fetal movement yet today but also just woke up. She denies uterine contractions, denies bleeding and leaking of fluid per vagina. States she has not had any further episodes this am, will allow the RN to monitor baby now.  Vitals:  Blood pressure (!) 152/87, pulse 75, temperature 98 F (36.7 C), temperature source Oral, resp. rate 20, last menstrual period 02/28/2019, SpO2 100 %. Physical Examination: CONSTITUTIONAL: Well-developed, well-nourished female in no acute distress.  HENT:  Normocephalic, atraumatic, External right and left ear normal. Oropharynx is clear and moist EYES: Conjunctivae and EOM are normal. Pupils are equal, round, and reactive to light. No scleral icterus.  NECK: Normal range of motion, supple, no masses. SKIN: Skin is warm and dry. No rash noted. Not diaphoretic. No erythema. No pallor. NEUROLGIC: Alert and oriented to person, place, and time. Normal reflexes, muscle tone coordination. No cranial nerve deficit noted. PSYCHIATRIC: Normal mood and affect. Normal behavior. Normal judgment and thought content. CARDIOVASCULAR: Normal heart rate noted RESPIRATORY: Effort normal, no problems with respiration noted MUSCULOSKELETAL: Normal range of motion. No edema and no tenderness. ABDOMEN: Soft, nontender, nondistended, gravid. CERVIX: dferred  Fetal monitoring: refusing fetal monitoring  Results for orders placed or performed during the hospital encounter of 11/16/19 (from the past 48 hour(s))  CBC     Status: Abnormal   Collection Time: 11/16/19  7:13 AM  Result Value Ref Range   WBC 12.5 (H) 4.0 - 10.5 K/uL   RBC 3.05 (L) 3.87 - 5.11 MIL/uL   Hemoglobin  9.6 (L) 12.0 - 15.0 g/dL   HCT 53.6 (L) 64.4 - 03.4 %   MCV 89.8 80.0 - 100.0 fL   MCH 31.5 26.0 - 34.0 pg   MCHC 35.0 30.0 - 36.0 g/dL   RDW 74.2 59.5 - 63.8 %   Platelets 165 150 - 400 K/uL   nRBC 0.0 0.0 - 0.2 %    Comment: Performed at [facility], [address] [place], [place] [address]  Comprehensive metabolic panel     Status: Abnormal   Collection Time: 11/16/19  7:13 AM  Result Value Ref Range   Sodium 140 135 - 145 mmol/L   Potassium 2.7 (LL) 3.5 - 5.1 mmol/L    Comment: CRITICAL RESULT CALLED TO, READ BACK BY AND VERIFIED WITH: A.Rema Fendt 3295 11/16/19 CLARK,S    Chloride 104 98 - 111 mmol/L   CO2 22 22 - 32 mmol/L   Glucose, Bld 103 (H) 70 - 99 mg/dL    Comment: Glucose reference range applies only to samples taken after fasting for at least 8 hours.   BUN 5 (L) 6 - 20 mg/dL   Creatinine, Ser 1.88 0.44 - 1.00 mg/dL   Calcium 8.5 (L) 8.9 - 10.3 mg/dL   Total Protein 5.7 (L) 6.5 - 8.1 g/dL   Albumin 3.1 (L) 3.5 - 5.0 g/dL   AST 24 15 - 41 U/L   ALT 21 0 - 44 U/L   Alkaline Phosphatase 46 38 - 126 U/L   Total Bilirubin 1.0 0.3 - 1.2 mg/dL   GFR calc non Af Amer >60 >60 mL/min   GFR calc Af Amer >60 >60 mL/min   Anion  gap 14 5 - 15    Comment: Performed at [facility], [place] [address], [place], [place] [address]  Protein / creatinine ratio, urine     Status: Abnormal   Collection Time: 11/16/19  7:38 AM  Result Value Ref Range   Creatinine, Urine 239.87 mg/dL   Total Protein, Urine 44 mg/dL    Comment: NO NORMAL RANGE ESTABLISHED FOR THIS TEST   Protein Creatinine Ratio 0.18 (H) 0.00 - 0.15 mg/mg[Cre]    Comment: Performed at [facility] [address] [place], [place] [address]  Urine rapid drug screen (hosp performed)     Status: Abnormal   Collection Time: 11/16/19  7:38 AM  Result Value Ref Range   Opiates NONE DETECTED NONE DETECTED   Cocaine NONE DETECTED NONE DETECTED   Benzodiazepines POSITIVE (A) NONE DETECTED    Amphetamines NONE DETECTED NONE DETECTED   Tetrahydrocannabinol POSITIVE (A) NONE DETECTED   Barbiturates NONE DETECTED NONE DETECTED    Comment: (NOTE) DRUG SCREEN FOR MEDICAL PURPOSES ONLY.  IF CONFIRMATION IS NEEDED FOR ANY PURPOSE, NOTIFY LAB WITHIN 5 DAYS. LOWEST DETECTABLE LIMITS FOR URINE DRUG SCREEN Drug Class                     Cutoff (ng/mL) Amphetamine and metabolites    1000 Barbiturate and metabolites    200 Benzodiazepine                 580 Tricyclics and metabolites     300 Opiates and metabolites        300 Cocaine and metabolites        300 THC                            50 Performed at [facility], [place] [address], [place], [place] [address]   Urinalysis, Routine w reflex microscopic     Status: Abnormal   Collection Time: 11/16/19  7:38 AM  Result Value Ref Range   Color, Urine YELLOW YELLOW   APPearance CLEAR CLEAR   Specific Gravity, Urine 1.017 1.005 - 1.030   pH 6.0 5.0 - 8.0   Glucose, UA 50 (A) NEGATIVE mg/dL   Hgb urine dipstick NEGATIVE NEGATIVE   Bilirubin Urine NEGATIVE NEGATIVE   Ketones, ur 80 (A) NEGATIVE mg/dL   Protein, ur 100 (A) NEGATIVE mg/dL   Nitrite NEGATIVE NEGATIVE   Leukocytes,Ua NEGATIVE NEGATIVE   RBC / HPF 0-5 0 - 5 RBC/hpf   WBC, UA 0-5 0 - 5 WBC/hpf   Bacteria, UA FEW (A) NONE SEEN   Squamous Epithelial / LPF 0-5 0 - 5   Mucus PRESENT     Comment: Performed at [facility], [address], [place], [place] [address]  Prolactin     Status: Abnormal   Collection Time: 11/16/19  8:36 AM  Result Value Ref Range   Prolactin 163.0 (H) 4.8 - 23.3 ng/mL    Comment: (NOTE) Performed At: [facility] [address] [place], [place] [id number] Rush Farmer MD FX:[id number]   Lactic acid, plasma     Status: None   Collection Time: 11/16/19  8:36 AM  Result Value Ref Range   Lactic Acid, Venous 1.0 0.5 - 1.9 mmol/L    Comment: Performed at [facility], [place] [address] [place], [place] [address]   Type and screen [place]  Status: None   Collection Time: 11/16/19  8:36 AM  Result Value Ref Range   ABO/RH(D) O POS    Antibody Screen NEG    Sample Expiration      11/19/2019,2359 Performed at [facility], [address], [place], [place] [address]   ABO/Rh     Status: None   Collection Time: 11/16/19  8:36 AM  Result Value Ref Range   ABO/RH(D)      O POS Performed at [facility], [address], [place], [place] [address]   SARS CORONAVIRUS 2 (TAT 6-24 HRS) Nasopharyngeal Nasopharyngeal Swab     Status: None   Collection Time: 11/16/19 12:35 PM   Specimen: Nasopharyngeal Swab  Result Value Ref Range   SARS Coronavirus 2 NEGATIVE NEGATIVE    Comment: (NOTE) SARS-CoV-2 target nucleic acids are NOT DETECTED. The SARS-CoV-2 RNA is generally detectable in upper and lower respiratory specimens during the acute phase of infection. Negative results do not preclude SARS-CoV-2 infection, do not rule out co-infections with other pathogens, and should not be used as the sole basis for treatment or other patient management decisions. Negative results must be combined with clinical observations, patient history, and epidemiological information. The expected result is Negative. Fact Sheet for Patients: HairSlick.no Fact Sheet for Healthcare Providers: quierodirigir.com This test is not yet approved or cleared by the [place] FDA and  has been authorized for detection and/or diagnosis of SARS-CoV-2 by FDA under an Emergency Use Authorization (EUA). This EUA will remain  in effect (meaning this test can be used) for the duration of the COVID-19 declaration under Section 56 4(b)(1) of the Act, 21 U.S.C. section 360bbb-3(b)(1), unless the authorization is terminated or revoked sooner. Performed at [facility], [address] [place], [place] [address]   Basic metabolic panel      Status: Abnormal   Collection Time: 11/16/19  7:48 PM  Result Value Ref Range   Sodium 138 135 - 145 mmol/L   Potassium 3.3 (L) 3.5 - 5.1 mmol/L   Chloride 105 98 - 111 mmol/L   CO2 21 (L) 22 - 32 mmol/L   Glucose, Bld 106 (H) 70 - 99 mg/dL    Comment: Glucose reference range applies only to samples taken after fasting for at least 8 hours.   BUN <5 (L) 6 - 20 mg/dL   Creatinine, Ser 1.49 0.44 - 1.00 mg/dL   Calcium 7.7 (L) 8.9 - 10.3 mg/dL   GFR calc non Af Amer >60 >60 mL/min   GFR calc Af Amer >60 >60 mL/min   Anion gap 12 5 - 15    Comment: Performed at [facility], [address] [place], [place] [address]  Basic metabolic panel     Status: Abnormal   Collection Time: 11/17/19  4:52 AM  Result Value Ref Range   Sodium 136 135 - 145 mmol/L   Potassium 3.2 (L) 3.5 - 5.1 mmol/L   Chloride 108 98 - 111 mmol/L   CO2 22 22 - 32 mmol/L   Glucose, Bld 94 70 - 99 mg/dL    Comment: Glucose reference range applies only to samples taken after fasting for at least 8 hours.   BUN <5 (L) 6 - 20 mg/dL   Creatinine, Ser 7.85 0.44 - 1.00 mg/dL   Calcium 7.5 (L) 8.9 - 10.3 mg/dL   GFR calc non Af Amer >60 >60 mL/min   GFR calc Af Amer >60 >60 mL/min  Anion gap 6 5 - 15    Comment: Performed at [facility], [address], [place], [place] [address]    I have reviewed the patient's current medications.  ASSESSMENT: Active Problems:   Seizure (HCC)   PLAN: Cont to replete potassium 24 hr EEG per neurology Cont anti-emetics as needed    Appreciate Neurology input   Continue routine antenatal care.   Baldemar Lenis, M.D. [facility]ending [facility] [employer] (Faculty Practice)  11/17/2019 9:40 AM

## 2019-11-17 NOTE — Care Plan (Signed)
Induction Assessment Scheduling Form: Fax to [facility]:  [phone] Route to [facility] Labor Delivery   Leslie Yates                                                                                   DOB:  [birth year]                                                            MRN:  [id number]  Phone:  Home Phone [phone]  Mobile [phone]    Provider:  [facility] (Faculty Practice)  Admission Date/Time:  12/03/19 GP:  G1P0000     Gestational age on admission:  37w                                                Estimated Date of Delivery: 12/24/19  Dating Criteria: [place] at 11 weeks  There were no vitals filed for this visit.  GBS:   HIV:  Non Reactive (04/09 0819)  Reason for induction:  Gestational hypertension Scheduling Provider Signature:  Conan Bowens, MD         Method of induction(proposed):  Cytotec   Scheduling Provider Signature:  Conan Bowens, MD                                            Today's Date:  11/17/2019

## 2019-11-17 NOTE — Progress Notes (Signed)
Pt walking in halls with partner.  Monitoring offered by RN.  Pt states that she discussed with her partner and she would like to go home to have a bath.  MD called and is on the way to the bedside to discuss POC.  Pt states she is going to walk.  Fall risk and lack of order discussed, pt states she is going for a walk.  Wheelchair given.

## 2019-11-17 NOTE — Progress Notes (Signed)
MD at [facility] to discuss further POC and recommendation for continued monitoring of BP and FHR.

## 2019-11-17 NOTE — Discharge Summary (Signed)
Antenatal Physician Discharge Summary   PATIENT LEFT AGAINST MEDICAL ADVICE  Patient ID: Leslie Yates MRN: [id number] DOB/AGE: [birth year] 29 y.o.  Admit date: 11/16/2019 Discharge date: 11/17/2019  Admission Diagnoses: possible seizure  Discharge Diagnoses:  Active Problems:   Seizure [facility])   Gestational hypertension   PATIENT LEFT AGAINST MEDICAL ADVICE  Prenatal Procedures: NST and ultrasound  Hospital Course:  This is a 29 y.o. G1P0000 with IUP at [redacted]w[redacted]d admitted for hyperemesis and questionable seizure episode. She had several witnessed seizures in the hospital and was seen by neurology. 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 Patient seen by psychiatry and encouraged to attend talk therapy.  While in house, patient noted to have mild range blood pressures and was diagnosed with new onset gestational hypertension. She had repeat lab work with increased LFTs. I recommended repeat AST/ALT in am and patient declined. She is leaving AMA. I reviewed the risks with her and her fiance, risk of significant fetal and/or maternal morbidity including fetal or maternal death. She verbalizes understanding and desires to leave. She signed AMA paperwork.  She did agree to come to office tomorrow for BP check and repeat lab work. BPP 8/8 and NST reactive today.  She will be follow up as outpatient.   Discharge Exam: Temp:  [97.8 F (36.6 C)-98.5 F (36.9 C)] 98.4 F (36.9 C) (05/25 1529) Pulse Rate:  [66-84] 82 (05/25 1529) Resp:  [18-20] 18 (05/25 1529) BP: (131-153)/(66-87) 144/75 (05/25 1529) SpO2:  [100 %] 100 % (05/25 1529) Physical Examination: CONSTITUTIONAL: Well-developed, well-nourished female in no acute distress.  HENT:  Normocephalic, atraumatic, External right and left ear normal. Oropharynx is clear and moist EYES: Conjunctivae and EOM are normal. Pupils are equal, round, and reactive to light. No scleral icterus.   NECK: Normal range of motion, supple, no masses SKIN: Skin is warm and dry. No rash noted. Not diaphoretic. No erythema. No pallor. NEUROLGIC: Alert and oriented to person, place, and time. Normal reflexes, muscle tone coordination. No cranial nerve deficit noted. PSYCHIATRIC: Normal mood and affect. Normal behavior. Normal judgment and thought content. CARDIOVASCULAR: Normal heart rate noted RESPIRATORY: Effort normal, no problems with respiration noted MUSCULOSKELETAL: Normal range of motion. No edema and no tenderness. 2+ distal pulses. ABDOMEN: Soft, nontender, nondistended, gravid. CERVIX:   deferred    Significant Diagnostic Studies:  Results for orders placed or performed during the hospital encounter of 11/16/19 (from the past 168 hour(s))  CBC   Collection Time: 11/16/19  7:13 AM  Result Value Ref Range   WBC 12.5 (H) 4.0 - 10.5 K/uL   RBC 3.05 (L) 3.87 - 5.11 MIL/uL   Hemoglobin 9.6 (L) 12.0 - 15.0 g/dL   HCT 69.4 (L) 85.4 - 62.7 %   MCV 89.8 80.0 - 100.0 fL   MCH 31.5 26.0 - 34.0 pg   MCHC 35.0 30.0 - 36.0 g/dL   RDW 03.5 00.9 - 38.1 %   Platelets 165 150 - 400 K/uL   nRBC 0.0 0.0 - 0.2 %  Comprehensive metabolic panel   Collection Time: 11/16/19  7:13 AM  Result Value Ref Range   Sodium 140 135 - 145 mmol/L   Potassium 2.7 (LL) 3.5 - 5.1 mmol/L   Chloride 104 98 - 111 mmol/L   CO2 22 22 - 32 mmol/L   Glucose, Bld 103 (H) 70 - 99 mg/dL   BUN 5 (L) 6 - 20 mg/dL   Creatinine,  Ser 0.71 0.44 - 1.00 mg/dL   Calcium 8.5 (L) 8.9 - 10.3 mg/dL   Total Protein 5.7 (L) 6.5 - 8.1 g/dL   Albumin 3.1 (L) 3.5 - 5.0 g/dL   AST 24 15 - 41 U/L   ALT 21 0 - 44 U/L   Alkaline Phosphatase 46 38 - 126 U/L   Total Bilirubin 1.0 0.3 - 1.2 mg/dL   GFR calc non Af Amer >60 >60 mL/min   GFR calc Af Amer >60 >60 mL/min   Anion gap 14 5 - 15  Protein / creatinine ratio, urine   Collection Time: 11/16/19  7:38 AM  Result Value Ref Range   Creatinine, Urine 239.87 mg/dL   Total  Protein, Urine 44 mg/dL   Protein Creatinine Ratio 0.18 (H) 0.00 - 0.15 mg/mg[Cre]  Urine rapid drug screen (hosp performed)   Collection Time: 11/16/19  7:38 AM  Result Value Ref Range   Opiates NONE DETECTED NONE DETECTED   Cocaine NONE DETECTED NONE DETECTED   Benzodiazepines POSITIVE (A) NONE DETECTED   Amphetamines NONE DETECTED NONE DETECTED   Tetrahydrocannabinol POSITIVE (A) NONE DETECTED   Barbiturates NONE DETECTED NONE DETECTED  Urinalysis, Routine w reflex microscopic   Collection Time: 11/16/19  7:38 AM  Result Value Ref Range   Color, Urine YELLOW YELLOW   APPearance CLEAR CLEAR   Specific Gravity, Urine 1.017 1.005 - 1.030   pH 6.0 5.0 - 8.0   Glucose, UA 50 (A) NEGATIVE mg/dL   Hgb urine dipstick NEGATIVE NEGATIVE   Bilirubin Urine NEGATIVE NEGATIVE   Ketones, ur 80 (A) NEGATIVE mg/dL   Protein, ur 314 (A) NEGATIVE mg/dL   Nitrite NEGATIVE NEGATIVE   Leukocytes,Ua NEGATIVE NEGATIVE   RBC / HPF 0-5 0 - 5 RBC/hpf   WBC, UA 0-5 0 - 5 WBC/hpf   Bacteria, UA FEW (A) NONE SEEN   Squamous Epithelial / LPF 0-5 0 - 5   Mucus PRESENT   Prolactin   Collection Time: 11/16/19  8:36 AM  Result Value Ref Range   Prolactin 163.0 (H) 4.8 - 23.3 ng/mL  Lactic acid, plasma   Collection Time: 11/16/19  8:36 AM  Result Value Ref Range   Lactic Acid, Venous 1.0 0.5 - 1.9 mmol/L  Type and screen [facility]   Collection Time: 11/16/19  8:36 AM  Result Value Ref Range   ABO/RH(D) O POS    Antibody Screen NEG    Sample Expiration      11/19/2019,2359 Performed at [facility], [address], [place], [place] [address]   ABO/Rh   Collection Time: 11/16/19  8:36 AM  Result Value Ref Range   ABO/RH(D)      O POS Performed at [facility], [address], [place], [place] [address]   SARS CORONAVIRUS 2 (TAT 6-24 HRS) Nasopharyngeal Nasopharyngeal Swab   Collection Time: 11/16/19 12:35 PM   Specimen: Nasopharyngeal Swab  Result Value Ref Range    SARS Coronavirus 2 NEGATIVE NEGATIVE  Basic metabolic panel   Collection Time: 11/16/19  7:48 PM  Result Value Ref Range   Sodium 138 135 - 145 mmol/L   Potassium 3.3 (L) 3.5 - 5.1 mmol/L   Chloride 105 98 - 111 mmol/L   CO2 21 (L) 22 - 32 mmol/L   Glucose, Bld 106 (H) 70 - 99 mg/dL   BUN <5 (L) 6 - 20 mg/dL   Creatinine, Ser 8.50 0.44 - 1.00 mg/dL   Calcium  7.7 (L) 8.9 - 10.3 mg/dL   GFR calc non Af Amer >60 >60 mL/min   GFR calc Af Amer >60 >60 mL/min   Anion gap 12 5 - 15  Basic metabolic panel   Collection Time: 11/17/19  4:52 AM  Result Value Ref Range   Sodium 136 135 - 145 mmol/L   Potassium 3.2 (L) 3.5 - 5.1 mmol/L   Chloride 108 98 - 111 mmol/L   CO2 22 22 - 32 mmol/L   Glucose, Bld 94 70 - 99 mg/dL   BUN <5 (L) 6 - 20 mg/dL   Creatinine, Ser 1.610.62 0.44 - 1.00 mg/dL   Calcium 7.5 (L) 8.9 - 10.3 mg/dL   GFR calc non Af Amer >60 >60 mL/min   GFR calc Af Amer >60 >60 mL/min   Anion gap 6 5 - 15  Comprehensive metabolic panel   Collection Time: 11/17/19  1:57 PM  Result Value Ref Range   Sodium 138 135 - 145 mmol/L   Potassium 3.6 3.5 - 5.1 mmol/L   Chloride 106 98 - 111 mmol/L   CO2 21 (L) 22 - 32 mmol/L   Glucose, Bld 109 (H) 70 - 99 mg/dL   BUN <5 (L) 6 - 20 mg/dL   Creatinine, Ser 0.960.72 0.44 - 1.00 mg/dL   Calcium 8.5 (L) 8.9 - 10.3 mg/dL   Total Protein 6.2 (L) 6.5 - 8.1 g/dL   Albumin 3.3 (L) 3.5 - 5.0 g/dL   AST 57 (H) 15 - 41 U/L   ALT 55 (H) 0 - 44 U/L   Alkaline Phosphatase 57 38 - 126 U/L   Total Bilirubin 1.2 0.3 - 1.2 mg/dL   GFR calc non Af Amer >60 >60 mL/min   GFR calc Af Amer >60 >60 mL/min   Anion gap 11 5 - 15  CBC   Collection Time: 11/17/19  1:57 PM  Result Value Ref Range   WBC 10.4 4.0 - 10.5 K/uL   RBC 3.64 (L) 3.87 - 5.11 MIL/uL   Hemoglobin 11.6 (L) 12.0 - 15.0 g/dL   HCT 04.532.7 (L) 40.936.0 - 81.146.0 %   MCV 89.8 80.0 - 100.0 fL   MCH 31.9 26.0 - 34.0 pg   MCHC 35.5 30.0 - 36.0 g/dL   RDW 91.411.8 78.211.5 - 95.615.5 %   Platelets 212 150 - 400  K/uL   nRBC 0.0 0.0 - 0.2 %  Results for orders placed or performed during the hospital encounter of 11/15/19 (from the past 168 hour(s))  Comprehensive metabolic panel   Collection Time: 11/15/19  1:11 PM  Result Value Ref Range   Sodium 139 135 - 145 mmol/L   Potassium 3.2 (L) 3.5 - 5.1 mmol/L   Chloride 107 98 - 111 mmol/L   CO2 21 (L) 22 - 32 mmol/L   Glucose, Bld 120 (H) 70 - 99 mg/dL   BUN 5 (L) 6 - 20 mg/dL   Creatinine, Ser 2.130.81 0.44 - 1.00 mg/dL   Calcium 8.6 (L) 8.9 - 10.3 mg/dL   Total Protein 5.9 (L) 6.5 - 8.1 g/dL   Albumin 3.1 (L) 3.5 - 5.0 g/dL   AST 20 15 - 41 U/L   ALT 17 0 - 44 U/L   Alkaline Phosphatase 47 38 - 126 U/L   Total Bilirubin 0.9 0.3 - 1.2 mg/dL   GFR calc non Af Amer >60 >60 mL/min   GFR calc Af Amer >60 >60 mL/min   Anion gap 11 5 - 15  Urinalysis, Routine w reflex microscopic   Collection Time: 11/15/19  1:45 PM  Result Value Ref Range   Color, Urine AMBER (A) YELLOW   APPearance HAZY (A) CLEAR   Specific Gravity, Urine 1.020 1.005 - 1.030   pH 6.0 5.0 - 8.0   Glucose, UA 50 (A) NEGATIVE mg/dL   Hgb urine dipstick NEGATIVE NEGATIVE   Bilirubin Urine NEGATIVE NEGATIVE   Ketones, ur 20 (A) NEGATIVE mg/dL   Protein, ur 948 (A) NEGATIVE mg/dL   Nitrite NEGATIVE NEGATIVE   Leukocytes,Ua NEGATIVE NEGATIVE   RBC / HPF 0-5 0 - 5 RBC/hpf   WBC, UA 6-10 0 - 5 WBC/hpf   Bacteria, UA RARE (A) NONE SEEN   Squamous Epithelial / LPF 6-10 0 - 5   Mucus PRESENT   Results for orders placed or performed during the hospital encounter of 11/14/19 (from the past 168 hour(s))  Urinalysis, Routine w reflex microscopic   Collection Time: 11/14/19  7:53 AM  Result Value Ref Range   Color, Urine YELLOW YELLOW   APPearance CLEAR CLEAR   Specific Gravity, Urine 1.011 1.005 - 1.030   pH 7.0 5.0 - 8.0   Glucose, UA >=500 (A) NEGATIVE mg/dL   Hgb urine dipstick NEGATIVE NEGATIVE   Bilirubin Urine NEGATIVE NEGATIVE   Ketones, ur 20 (A) NEGATIVE mg/dL   Protein,  ur 30 (A) NEGATIVE mg/dL   Nitrite NEGATIVE NEGATIVE   Leukocytes,Ua NEGATIVE NEGATIVE   RBC / HPF 0-5 0 - 5 RBC/hpf   WBC, UA 0-5 0 - 5 WBC/hpf   Bacteria, UA NONE SEEN NONE SEEN   Squamous Epithelial / LPF 0-5 0 - 5   Mucus PRESENT   CBC   Collection Time: 11/14/19  7:53 AM  Result Value Ref Range   WBC 8.7 4.0 - 10.5 K/uL   RBC 3.58 (L) 3.87 - 5.11 MIL/uL   Hemoglobin 11.1 (L) 12.0 - 15.0 g/dL   HCT 54.6 (L) 27.0 - 35.0 %   MCV 88.5 80.0 - 100.0 fL   MCH 31.0 26.0 - 34.0 pg   MCHC 35.0 30.0 - 36.0 g/dL   RDW 09.3 81.8 - 29.9 %   Platelets 199 150 - 400 K/uL   nRBC 0.0 0.0 - 0.2 %  Comprehensive metabolic panel   Collection Time: 11/14/19  7:53 AM  Result Value Ref Range   Sodium 139 135 - 145 mmol/L   Potassium 3.0 (L) 3.5 - 5.1 mmol/L   Chloride 108 98 - 111 mmol/L   CO2 20 (L) 22 - 32 mmol/L   Glucose, Bld 112 (H) 70 - 99 mg/dL   BUN 6 6 - 20 mg/dL   Creatinine, Ser 3.71 0.44 - 1.00 mg/dL   Calcium 9.2 8.9 - 69.6 mg/dL   Total Protein 6.7 6.5 - 8.1 g/dL   Albumin 3.4 (L) 3.5 - 5.0 g/dL   AST 15 15 - 41 U/L   ALT 12 0 - 44 U/L   Alkaline Phosphatase 53 38 - 126 U/L   Total Bilirubin 0.8 0.3 - 1.2 mg/dL   GFR calc non Af Amer >60 >60 mL/min   GFR calc Af Amer >60 >60 mL/min   Anion gap 11 5 - 15  Rapid urine drug screen (hospital performed)   Collection Time: 11/14/19  7:53 AM  Result Value Ref Range   Opiates NONE DETECTED NONE DETECTED   Cocaine NONE DETECTED NONE DETECTED   Benzodiazepines NONE DETECTED NONE DETECTED   Amphetamines NONE DETECTED NONE  DETECTED   Tetrahydrocannabinol POSITIVE (A) NONE DETECTED   Barbiturates NONE DETECTED NONE DETECTED    Discharge Condition: Stable  Disposition:  There are no questions and answers to display.               Signed: Feliz Beam, M.D. [facility] [employer] (Faculty Practice)  11/17/2019, 5:13 PM

## 2019-11-17 NOTE — Progress Notes (Signed)
MD recommending overnight stay for continued monitoring of labs and fetal heart rate.  Pt declines and wishes to sign AMA papers.  Risk of leaving discussed when AMA papers were signed including worsening labs indicative of Pre-E and harm to mother or fetus.  Patient verbalizes understanding and AMA papers signed.

## 2019-11-17 NOTE — Consult Note (Signed)
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 Earlene Plateravis [facility] [phone] Location of Provider: [facility]  Patient Identification: Leslie Yates MRN:  [id number] Principal Diagnosis: <principal problem not specified> Diagnosis:  Active Problems:   Seizure (HCC)   Gestational hypertension   Total Time spent with patient: 45 minutes  Subjective: "I came in because I am vomiting and I cannot keep anything down." Leslie Yates is a 29 y.o. female patient.  Patient assessed by nurse practitioner.  Patient alert and oriented, answers appropriately. Patient denies suicidal ideations.  Patient reports a history of 2 suicide attempts while in high school.  Patient states of "I was bullied in high school related to my size, I am a big girl."  Patient denies homicidal ideations.  Patient denies auditory and visual hallucinations.  Patient denies symptoms of paranoia. Patient reports adequate sleep, patient reports appetite is "not good at all." Patient reports she currently lives with her fianc in [place].  Patient denies access to weapons.  Patient is currently not employed.  Patient denies current substance use.  Patient reports last use of alcohol approximately 5 months ago, last use of marijuana approximately 1 month ago. Patient gives verbal consent to speak with her Pearletha Forgefianc, Tyisha Batts phone number [phone]. Spoke with patient's fianc, [employer]isha Batts.  Patient's fianc denies concern for patient safety.  Patient's fianc states "I think the most stressful part for her right now is her family, her mother wants her to do errands every day."  Patient's fianc denies patient has access to weapons.  Patient's fianc denies any suicide attempts previously.    Past Psychiatric History: THC use  Risk to Self:  Denies Risk to Others:  Denies Prior Inpatient  Therapy:  None reported Prior Outpatient Therapy:  currently sees outpatient PMHNP at [facility] monthly  Past Medical History:  Past Medical History:  Diagnosis Date  . Asthma   . Obesity   . Ovarian cyst   . Sickle cell trait [facility](HCC)     Past Surgical History:  Procedure Laterality Date  . INCISION AND DRAINAGE ABSCESS     Family History:  Family History  Problem Relation Age of Onset  . Diabetes Mother   . Hypertension Mother   . Diabetes Father   . Hypertension Father    Family Psychiatric  History: Sister-substance use disorder Social History:  Social History   Substance and Sexual Activity  Alcohol Use Not Currently   Comment: occ     Social History   Substance and Sexual Activity  Drug Use Not Currently  . Types: Benzodiazepines, Marijuana   Comment: States she never fill prescription, last time marijuana use yesterday    Social History   Socioeconomic History  . Marital status: Single    Spouse name: Not on file  . Number of children: Not on file  . Years of education: Not on file  . Highest education level: Some college, no degree  Occupational History  . Not on file  Tobacco Use  . Smoking status: Current Every Day Smoker    Packs/day: 0.25    Years: 11.00    Pack years: 2.75    Types: Cigarettes  . Smokeless tobacco: Never Used  Substance and Sexual Activity  . Alcohol use: Not Currently    Comment: occ  . Drug use: Not Currently    Types: Benzodiazepines, Marijuana    Comment: States  she never fill prescription, last time marijuana use yesterday  . Sexual activity: Yes    Birth control/protection: None  Other Topics Concern  . Not on file  Social History Narrative   ** Merged History Encounter **       ** Merged History Encounter ** Patient is having trouble with financial issues.   Social Determinants of Health   Financial Resource Strain: High Risk  . Difficulty of Paying Living Expenses: Hard  Food Insecurity: Food Insecurity  Present  . Worried About Programme researcher, broadcasting/film/video in the Last Year: Sometimes true  . Ran Out of Food in the Last Year: Sometimes true  Transportation Needs: No Transportation Needs  . Lack of Transportation (Medical): No  . Lack of Transportation (Non-Medical): No  Physical Activity: Inactive  . Days of Exercise per Week: 0 days  . Minutes of Exercise per Session: 0 min  Stress: No Stress Concern Present  . Feeling of Stress : Not at all  Social Connections: Moderately Isolated  . Frequency of Communication with Friends and Family: More than three times a week  . Frequency of Social Gatherings with Friends and Family: More than three times a week  . Attends Religious Services: Never  . Active Member of Clubs or Organizations: No  . Attends Banker Meetings: Never  . Marital Status: Never married   Additional Social History:    Allergies:   Allergies  Allergen Reactions  . Penicillins Rash    Childhood rash Has patient had a PCN reaction causing immediate rash, facial/tongue/throat swelling, SOB or lightheadedness with hypotension: no Has patient had a PCN reaction causing severe rash involving mucus membranes or skin necrosis:no Has patient had a PCN reaction that required hospitalization no Has patient had a PCN reaction occurring within the last 10 years: no If all of the above answers are "NO", then may proceed with Cephalosporin use.     Labs:  Results for orders placed or performed during the hospital encounter of 11/16/19 (from the past 48 hour(s))  CBC     Status: Abnormal   Collection Time: 11/16/19  7:13 AM  Result Value Ref Range   WBC 12.5 (H) 4.0 - 10.5 K/uL   RBC 3.05 (L) 3.87 - 5.11 MIL/uL   Hemoglobin 9.6 (L) 12.0 - 15.0 g/dL   HCT 95.6 (L) 38.7 - 56.4 %   MCV 89.8 80.0 - 100.0 fL   MCH 31.5 26.0 - 34.0 pg   MCHC 35.0 30.0 - 36.0 g/dL   RDW 33.2 95.1 - 88.4 %   Platelets 165 150 - 400 K/uL   nRBC 0.0 0.0 - 0.2 %    Comment: Performed at [facility], [address], [place], [place] [address]  Comprehensive metabolic panel     Status: Abnormal   Collection Time: 11/16/19  7:13 AM  Result Value Ref Range   Sodium 140 135 - 145 mmol/L   Potassium 2.7 (LL) 3.5 - 5.1 mmol/L    Comment: CRITICAL RESULT CALLED TO, READ BACK BY AND VERIFIED WITH: A.Rema Fendt 3016 11/16/19 CLARK,S    Chloride 104 98 - 111 mmol/L   CO2 22 22 - 32 mmol/L   Glucose, Bld 103 (H) 70 - 99 mg/dL    Comment: Glucose reference range applies only to samples taken after fasting for at least 8 hours.   BUN 5 (L) 6 - 20 mg/dL   Creatinine, Ser 0.10 0.44 - 1.00 mg/dL   Calcium 8.5 (L)  8.9 - 10.3 mg/dL   Total Protein 5.7 (L) 6.5 - 8.1 g/dL   Albumin 3.1 (L) 3.5 - 5.0 g/dL   AST 24 15 - 41 U/L   ALT 21 0 - 44 U/L   Alkaline Phosphatase 46 38 - 126 U/L   Total Bilirubin 1.0 0.3 - 1.2 mg/dL   GFR calc non Af Amer >60 >60 mL/min   GFR calc Af Amer >60 >60 mL/min   Anion gap 14 5 - 15    Comment: Performed at [facility], [address] [place], [place] [address]  Protein / creatinine ratio, urine     Status: Abnormal   Collection Time: 11/16/19  7:38 AM  Result Value Ref Range   Creatinine, Urine 239.87 mg/dL   Total Protein, Urine 44 mg/dL    Comment: NO NORMAL RANGE ESTABLISHED FOR THIS TEST   Protein Creatinine Ratio 0.18 (H) 0.00 - 0.15 mg/mg[Cre]    Comment: Performed at [facility], [address] [place], [place] [address]  Urine rapid drug screen (hosp performed)     Status: Abnormal   Collection Time: 11/16/19  7:38 AM  Result Value Ref Range   Opiates NONE DETECTED NONE DETECTED   Cocaine NONE DETECTED NONE DETECTED   Benzodiazepines POSITIVE (A) NONE DETECTED   Amphetamines NONE DETECTED NONE DETECTED   Tetrahydrocannabinol POSITIVE (A) NONE DETECTED   Barbiturates NONE DETECTED NONE DETECTED    Comment: (NOTE) DRUG SCREEN FOR MEDICAL PURPOSES ONLY.  IF CONFIRMATION IS NEEDED FOR ANY PURPOSE, NOTIFY LAB WITHIN 5  DAYS. LOWEST DETECTABLE LIMITS FOR URINE DRUG SCREEN Drug Class                     Cutoff (ng/mL) Amphetamine and metabolites    1000 Barbiturate and metabolites    200 Benzodiazepine                 200 Tricyclics and metabolites     300 Opiates and metabolites        300 Cocaine and metabolites        300 THC                            50 Performed at [facility], [address] [place], [place] [address]   Urinalysis, Routine w reflex microscopic     Status: Abnormal   Collection Time: 11/16/19  7:38 AM  Result Value Ref Range   Color, Urine YELLOW YELLOW   APPearance CLEAR CLEAR   Specific Gravity, Urine 1.017 1.005 - 1.030   pH 6.0 5.0 - 8.0   Glucose, UA 50 (A) NEGATIVE mg/dL   Hgb urine dipstick NEGATIVE NEGATIVE   Bilirubin Urine NEGATIVE NEGATIVE   Ketones, ur 80 (A) NEGATIVE mg/dL   Protein, ur 259 (A) NEGATIVE mg/dL   Nitrite NEGATIVE NEGATIVE   Leukocytes,Ua NEGATIVE NEGATIVE   RBC / HPF 0-5 0 - 5 RBC/hpf   WBC, UA 0-5 0 - 5 WBC/hpf   Bacteria, UA FEW (A) NONE SEEN   Squamous Epithelial / LPF 0-5 0 - 5   Mucus PRESENT     Comment: Performed at [facility], [address] [place], [place] [address]  Prolactin     Status: Abnormal   Collection Time: 11/16/19  8:36 AM  Result Value Ref Range   Prolactin 163.0 (H) 4.8 - 23.3 ng/mL    Comment: (NOTE)  Performed At: [facility] [address] [place], [place] [id number] Jolene Schimke MD IR:[id number]   Lactic acid, plasma     Status: None   Collection Time: 11/16/19  8:36 AM  Result Value Ref Range   Lactic Acid, Venous 1.0 0.5 - 1.9 mmol/L    Comment: Performed at [facility], [address] [place], [place] [address]  Type and screen [facility]     Status: None   Collection Time: 11/16/19  8:36 AM  Result Value Ref Range   ABO/RH(D) O POS    Antibody Screen NEG    Sample Expiration      11/19/2019,2359 Performed at [facility], [address], [place], [place] [address]   ABO/Rh     Status: None   Collection Time: 11/16/19  8:36 AM  Result Value Ref Range   ABO/RH(D)      O POS Performed at [facility], [address] [place], [place] [address]   SARS CORONAVIRUS 2 (TAT 6-24 HRS) Nasopharyngeal Nasopharyngeal Swab     Status: None   Collection Time: 11/16/19 12:35 PM   Specimen: Nasopharyngeal Swab  Result Value Ref Range   SARS Coronavirus 2 NEGATIVE NEGATIVE    Comment: (NOTE) SARS-CoV-2 target nucleic acids are NOT DETECTED. The SARS-CoV-2 RNA is generally detectable in upper and lower respiratory specimens during the acute phase of infection. Negative results do not preclude SARS-CoV-2 infection, do not rule out co-infections with other pathogens, and should not be used as the sole basis for treatment or other patient management decisions. Negative results must be combined with clinical observations, patient history, and epidemiological information. The expected result is Negative. Fact Sheet for Patients: HairSlick.no Fact Sheet for Healthcare Providers: quierodirigir.com This test is not yet approved or cleared by the [place] FDA and  has been authorized for detection and/or diagnosis of SARS-CoV-2 by FDA under an Emergency Use Authorization (EUA). This EUA will remain  in effect (meaning this test can be used) for the duration of the COVID-19 declaration under Section 56 4(b)(1) of the Act, 21 U.S.C. section 360bbb-3(b)(1), unless the authorization is terminated or revoked sooner. Performed at [facility], [address] [place], [place] [address]   Basic metabolic panel     Status: Abnormal   Collection Time: 11/16/19  7:48 PM  Result Value Ref Range   Sodium 138 135 - 145 mmol/L   Potassium 3.3 (L) 3.5 - 5.1 mmol/L   Chloride 105 98 - 111 mmol/L   CO2 21 (L) 22 - 32 mmol/L   Glucose, Bld 106 (H) 70 - 99 mg/dL    Comment: Glucose  reference range applies only to samples taken after fasting for at least 8 hours.   BUN <5 (L) 6 - 20 mg/dL   Creatinine, Ser 8.10 0.44 - 1.00 mg/dL   Calcium 7.7 (L) 8.9 - 10.3 mg/dL   GFR calc non Af Amer >60 >60 mL/min   GFR calc Af Amer >60 >60 mL/min   Anion gap 12 5 - 15    Comment: Performed at [facility], [address] [place], [place] [address]  Basic metabolic panel     Status: Abnormal   Collection Time: 11/17/19  4:52 AM  Result Value Ref Range   Sodium 136 135 - 145 mmol/L   Potassium 3.2 (L) 3.5 - 5.1 mmol/L   Chloride 108 98 - 111 mmol/L   CO2 22  22 - 32 mmol/L   Glucose, Bld 94 70 - 99 mg/dL    Comment: Glucose reference range applies only to samples taken after fasting for at least 8 hours.   BUN <5 (L) 6 - 20 mg/dL   Creatinine, Ser 9.14 0.44 - 1.00 mg/dL   Calcium 7.5 (L) 8.9 - 10.3 mg/dL   GFR calc non Af Amer >60 >60 mL/min   GFR calc Af Amer >60 >60 mL/min   Anion gap 6 5 - 15    Comment: Performed at [facility], [address], [place], [place] [address]    Medications:  Current Facility-Administered Medications  Medication Dose Route Frequency Provider Last Rate Last Admin  . acetaminophen (TYLENOL) tablet 650 mg  650 mg Oral Q4H PRN Conan Bowens, MD      . calcium carbonate (TUMS - dosed in mg elemental calcium) chewable tablet 400 mg of elemental calcium  2 tablet Oral Q4H PRN Conan Bowens, MD      . docusate sodium (COLACE) capsule 100 mg  100 mg Oral Daily Conan Bowens, MD   Stopped at 11/16/19 1148  . lactated ringers infusion   Intravenous Continuous Nugent, Odie Sera, NP   Stopped at 11/17/19 0801  . ondansetron (ZOFRAN) injection 4 mg  4 mg Intravenous Q6H [name], MD   4 mg at 11/17/19 0847  . potassium chloride SA (KLOR-CON) CR tablet 40 mEq  40 mEq Oral BID Conan Bowens, MD      . prenatal multivitamin tablet 1 tablet  1 tablet Oral Q1200 Conan Bowens, MD   Stopped at 11/16/19 1148  . promethazine (PHENERGAN)  injection 25 mg  25 mg Intravenous Q6H PRN Conan Bowens, MD   25 mg at 11/17/19 6213    Musculoskeletal: Strength & Muscle Tone: within normal limits Gait & Station: normal Patient leans: N/A  Psychiatric Specialty Exam: Physical Exam  Nursing note and vitals reviewed. Constitutional: She is oriented to person, place, and time. She appears well-developed.  HENT:  Head: Normocephalic.  Cardiovascular: Normal rate.  Respiratory: Effort normal.  Musculoskeletal:     Cervical back: Normal range of motion.  Neurological: She is alert and oriented to person, place, and time.  Psychiatric: Her speech is normal and behavior is normal. Judgment and thought content normal. Cognition and memory are normal. She exhibits a depressed mood.    Review of Systems  Constitutional: Negative.   HENT: Negative.   Eyes: Negative.   Respiratory: Negative.   Cardiovascular: Negative.   Gastrointestinal: Negative.   Genitourinary: Negative.   Musculoskeletal: Negative.   Skin: Negative.   Neurological: Negative.     Blood pressure (!) 143/66, pulse 75, temperature 98.5 F (36.9 C), temperature source Oral, resp. rate 18, last menstrual period 02/28/2019, SpO2 100 %.There is no height or weight on file to calculate BMI.  General Appearance: Casual and Fairly Groomed  Eye Contact:  Good  Speech:  Clear and Coherent and Normal Rate  Volume:  Normal  Mood:  Depressed  Affect:  Congruent and Depressed  Thought Process:  Coherent, Goal Directed and Descriptions of Associations: Intact  Orientation:  Full (Time, Place, and Person)  Thought Content:  WDL and Logical  Suicidal Thoughts:  No  Homicidal Thoughts:  No  Memory:  Immediate;   Good Recent;   Good Remote;   Good  Judgement:  Fair  Insight:  Fair  Psychomotor Activity:  Normal  Concentration:  Concentration:  Good and Attention Span: Good  Recall:  Good  Fund of Knowledge:  Good  Language:  Good  Akathisia:  No  Handed:  Right  AIMS  (if indicated):     Assets:  Communication Skills Desire for Improvement Financial Resources/Insurance Housing Intimacy Leisure Time Physical Health Resilience Social Support  ADL's:  Intact  Cognition:  WNL  Sleep:        Treatment Plan Summary: Patient discussed Dr. Dwyane Dee.  Patient is a 29 year old female admitted with nausea and vomiting.  Patient reports currently seeing once monthly by psych mental health nurse practitioner at [facility] prenatal care visits.  Patient endorses plan to increase talk therapy frequency.Plan follow up with outpatient psychiatry and talk therapy.    Recommendations: Follow-up with outpatient psychiatry and talk therapy.   Disposition: No evidence of imminent risk to self or others at present.   Patient does not meet criteria for psychiatric inpatient admission. Supportive therapy provided about ongoing stressors.  This service was provided via telemedicine using a 2-way, interactive audio and video technology.  Names of all persons participating in this telemedicine service and their role in this encounter. Name: Brynlyn Dade Role: Patient  Name: Verl Blalock- via telephone Role: Patient's fiance  Name: Letitia Libra Role: FNP  Name: Dr. Dwyane Dee Role: Psychiatrist    Emmaline Kluver, FNP 11/17/2019 2:08 PM

## 2019-11-17 NOTE — Procedures (Addendum)
Patient Name: Leslie Yates  MRN: [id number]  Epilepsy Attending: Charlsie Quest  Referring Physician/Provider: Dr. Georgiana Spinner Aroor Duration: 11/16/2019 1453 to 11/17/2019 1615  Patient history: 29 year old female who is [redacted] weeks pregnant presenting to hospital with seizure-like activity.  EEG to evaluate for seizures.  Level of alertness: Awake, asleep  AEDs during EEG study: None  Technical aspects: This EEG study was done with scalp electrodes positioned according to the 10-20 International system of electrode placement. Electrical activity was acquired at a sampling rate of 500Hz  and reviewed with a high frequency filter of 70Hz  and a low frequency filter of 1Hz . EEG data were recorded continuously and digitally stored.   Description: The posterior dominant rhythm consists of 9-10 Hz activity of moderate voltage (25-35 uV) seen predominantly in posterior head regions, symmetric and reactive to eye opening and eye closing.  Sleep was characterized by vertex waves, sleep spindles (12 to 14 Hz), maximal frontocentral region.  Hyperventilation and photic stimulation were not performed.     Event button was pressed on 11/17/2019 at 0150.  On video patient was noted to be lying in the bed, hyperventilating and had nonrhythmic, whole-body twitching movements.  Concomitant EEG before during and after the episode showed normal posterior dominant rhythm without any EEG change to suggest seizure.  IMPRESSION: This study is within normal limits. No seizures or epileptiform discharges were seen throughout the recording.  Event button was pressed on 11/17/2019 at 0 150 as described above without concomitant EEG change.  This was a nonepileptic event.   Imaya Duffy 

## 2019-11-17 NOTE — Progress Notes (Signed)
NEUROLOGY PROGRESS NOTE   Subjective: Patient continues to have hyperemesis gravidarum.  States that she has not had any further events.  Exam: Vitals:   11/17/19 0501 11/17/19 0815  BP: 131/74 (!) 152/87  Pulse: 66 75  Resp: 18 20  Temp: 98 F (36.7 C) 98 F (36.7 C)  SpO2: 100% 100%    Neuro:  Mental Status: Alert, oriented, thought content appropriate.  Speech fluent without evidence of aphasia.  Able to follow 3 step commands without difficulty. Cranial Nerves: II:  Visual fields grossly normal,  III,IV, VI: ptosis not present, extra-ocular motions intact bilaterally pupils equal, round, reactive to light and accommodation V,VII: smile symmetric, facial light touch sensation normal bilaterally VIII: hearing normal bilaterally IX,X: Palate rises midline XI: bilateral shoulder shrug XII: midline tongue extension Motor: Right : Upper extremity   5/5    Left:     Upper extremity   5/5  Lower extremity   5/5     Lower extremity   5/5 Tone and bulk:normal tone throughout; no atrophy noted Sensory: Pinprick and light touch intact throughout, bilaterally   Medications:  Scheduled: . docusate sodium  100 mg Oral Daily  . ondansetron (ZOFRAN) IV  4 mg Intravenous Q6H  . potassium chloride  40 mEq Oral BID  . prenatal multivitamin  1 tablet Oral Q1200   Continuous: . lactated ringers Stopped (11/17/19 0801)   NWG:[id number], calcium carbonate, promethazine  Pertinent Labs/Diagnostics: -Potassium 3.2 -Calcium 7.5  Overnight EEG with video  Result Date: 11/17/2019  IMPRESSION: This study is within normal limits. No seizures or epileptiform discharges were seen throughout the recording. Event button was pressed on 11/17/2019 at 0 150 as described above without concomitant EEG change.  This was a nonepileptic event. Priyanka Lahoma Rocker PA-C [facility] [phone]  Assessment:  29 year old female with sickle cell trait, hyperemesis  gravidarum presenting to the ED after having weakness and shaking spells.  Patient continued to have multiple shaking spells while in the hospital.  Prior to being hooked up to EEG patient did have 1 shaking spells which she was nonresponsive until noxious stimuli was given to which she quickly responded with screaming.  Patient was hooked up for LTM overnight had 1 event button at 1:50 in the morning on 08/20/2019 which did not correlate with epileptiform activity.    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  Patient took the information very well.  She also admits she has been under a lot of stress lately.  Recommendations: -No recommendation to start antiepileptic medications -At this time neurology will sign off please call with any further questions    11/17/2019, 11:42 AM

## 2019-11-17 NOTE — Progress Notes (Signed)
Pt called out to speak to nurse.  Pt was in the bathroom with EEG leads removed per pt, she states she had just spoken to Neuro PA and is being discharged from neuro care.  Pt misunderstood this as meaning she would be discharged home.  When asked what patient would like to do her partner expressed that she would like to stay because she still isn't eating or drinking.  Normal hyperemesis care and length of stay discussed with both pt and partner, including the need to make sure pt is able to eat and dink adequately before going home.  Pt states she has not vomited since given IV medication at 0800.   During discussion pt was up and moving around the room with no difficulty.  She requested to go outside.  Pt told that MD would need to clear her and that and order would need to be placed but after that she can have wheelchair rides per order.  Pt verbalizes understanding.  Plan discussed to give IV medication every 6 hours and to encouraged eating soon after medication given.  MD contacted, POC discussed.

## 2019-11-17 NOTE — Progress Notes (Signed)
Pt is emotionally exhausted from continued vomiting despite interventions and states that she has not been able to keep any food or liquid down for 6 days.  Patient continues to be agitated with vomiting.  She appears to be emotionally distressed, crying and verbalizing frustration with being hungry but not being able to eat.  She is concerned that not eating will hurt her baby.  Pt reassured that vomiting in pregnancy does not normally cause significant distress to the baby, although the mental and emotional toll on mothers can be significant.  Pts frustrations validated and pt reassured that we will try multiple interventions to help her get relief.  Expectationa of  stopping the vomiting with possible continued nausea as a realistic goal discussed.   Pt continues to be agitated and emotional.     Pt states that RN in MAU mentioned a feeding tube and pt is requesting one be placed at this time.  Pt educated on spectrum of interventions for hyperemesis and how a feeding tube would not be appropriate while she is still vomiting.  Expectations for amount of relief she might get from medications as well as interventions moving forward including IV fluid, Iv nutrition, and medication discussed.  Pt verbalizes understanding.  At this time pt is declining IV fluid.  Pt given Phenergan and zofran IV but has removed scope patch.    Pt is reporting decreased fetal movement and states that she has felt the baby move "two or three times" since yesterday, which is less than normal.  Fetal movement in relation to fetal wellbeing and the concern for fetal distress in relation to significantly decreased fetal movement discussed.  Pt given education on doppler heart rate monitoring, intermittent NST, and continuous monitoring and the recommendation of continuous monitoring with decreased fetal movement.   Concern for poor outcomes related to decreased fetal movement discussed.  Alternative methods of securing monitors  discussed with patient to help reduce frustration related to monitor placement. Pt and support person verbalize understanding.  Pt declines monitoring at this time however agrees to monitoring when she wakes up from a nap.  Concerns for fetal distress discussed again, pt again declines monitoring other than doppler.  Plan established to monitor FHR when pt wakes from nap.

## 2019-11-17 NOTE — Progress Notes (Signed)
Pt reports eating toast and being able to keep it down.

## 2019-11-17 NOTE — Progress Notes (Signed)
Faculty Note  Down to see patient at RN request, pt strongly desiring discharge home. Has been cleared by neurology who feel these are stress induced episodes.  Pt reports she is feeling well, keeping down juice and broth with minimal nausea. Would like to go home as there are remedies at home (hot bath) that she can try that she cannot do here. Has not tried solid food yet. Denies other complaints.   I reviewed with patient that neurology feels these are stress induced episodes, offered and recommended psychiatry consultation to discuss her stressors and she is agreeable. I also reviewed that she needs to eat something and keep it down prior to discharge as she cannot subsist on juice alone, however with her hyperemesis, we do not expect her to have no nausea. She is agreeable to trying some toast as well. I also reviewed that she meets criteria for gestational hypertension as well and recommend BPP today as well as induction of labor @ 37 weeks. Emphasized importance of fetal monitoring. She is agreeable to this as well. Will also obtain lab work.   Reviewed that if fetal monitoring is reassuring and she is able to keep toast down, will discharge her to home. She is agreeable to this plan.    Baldemar Lenis, M.D. Attending [facility] [employer] [facility])

## 2019-11-17 NOTE — Progress Notes (Signed)
Patient refused continuous monitoring. Fetal dopplers obtained with HR of 149. Dr. Debroah Loop notified and made aware.

## 2019-11-18 ENCOUNTER — Inpatient Hospital Stay (HOSPITAL_COMMUNITY)
Admission: AD | Admit: 2019-11-18 | Discharge: 2019-11-23 | DRG: 807 | Disposition: A | Discharge status: Home / Self Care | Payer: Medicaid Other | Attending: Obstetrics and Gynecology | Admitting: Obstetrics and Gynecology

## 2019-11-18 ENCOUNTER — Telehealth: Payer: Self-pay | Admitting: General Practice

## 2019-11-18 ENCOUNTER — Ambulatory Visit (INDEPENDENT_AMBULATORY_CARE_PROVIDER_SITE_OTHER): Payer: Medicaid Other | Admitting: *Deleted

## 2019-11-18 ENCOUNTER — Encounter (HOSPITAL_COMMUNITY): Payer: Self-pay | Admitting: Obstetrics and Gynecology

## 2019-11-18 ENCOUNTER — Other Ambulatory Visit: Payer: Self-pay

## 2019-11-18 VITALS — BP 123/79 | HR 79 | Temp 98.0°F | Ht 67.0 in | Wt 247.2 lb

## 2019-11-18 DIAGNOSIS — R748 Abnormal levels of other serum enzymes: Secondary | ICD-10-CM | POA: Diagnosis present

## 2019-11-18 DIAGNOSIS — O99334 Smoking (tobacco) complicating childbirth: Secondary | ICD-10-CM | POA: Diagnosis present

## 2019-11-18 DIAGNOSIS — O139 Gestational [pregnancy-induced] hypertension without significant proteinuria, unspecified trimester: Secondary | ICD-10-CM

## 2019-11-18 DIAGNOSIS — O1424 HELLP syndrome, complicating childbirth: Principal | ICD-10-CM | POA: Diagnosis present

## 2019-11-18 DIAGNOSIS — Z3A34 34 weeks gestation of pregnancy: Secondary | ICD-10-CM

## 2019-11-18 DIAGNOSIS — O9902 Anemia complicating childbirth: Secondary | ICD-10-CM | POA: Diagnosis present

## 2019-11-18 DIAGNOSIS — O99210 Obesity complicating pregnancy, unspecified trimester: Secondary | ICD-10-CM

## 2019-11-18 DIAGNOSIS — D573 Sickle-cell trait: Secondary | ICD-10-CM | POA: Diagnosis present

## 2019-11-18 DIAGNOSIS — O0990 Supervision of high risk pregnancy, unspecified, unspecified trimester: Secondary | ICD-10-CM

## 2019-11-18 DIAGNOSIS — O133 Gestational [pregnancy-induced] hypertension without significant proteinuria, third trimester: Secondary | ICD-10-CM | POA: Diagnosis present

## 2019-11-18 DIAGNOSIS — Z880 Allergy status to penicillin: Secondary | ICD-10-CM

## 2019-11-18 DIAGNOSIS — F17210 Nicotine dependence, cigarettes, uncomplicated: Secondary | ICD-10-CM | POA: Diagnosis present

## 2019-11-18 LAB — CBC
HCT: 30.8 % — ABNORMAL LOW (ref 36.0–46.0)
Hematocrit: 33.2 % — ABNORMAL LOW (ref 34.0–46.6)
Hemoglobin: 11.3 g/dL (ref 11.1–15.9)
Hemoglobin: 10.8 g/dL — ABNORMAL LOW (ref 12.0–15.0)
MCH: 30.8 pg (ref 26.6–33.0)
MCH: 31.3 pg (ref 26.0–34.0)
MCHC: 34 g/dL (ref 31.5–35.7)
MCHC: 35.1 g/dL (ref 30.0–36.0)
MCV: 91 fL (ref 79–97)
MCV: 89.3 fL (ref 80.0–100.0)
Platelets: 205 10*3/uL (ref 150–450)
Platelets: 192 10*3/uL (ref 150–400)
RBC: 3.67 x10E6/uL — ABNORMAL LOW (ref 3.77–5.28)
RBC: 3.45 MIL/uL — ABNORMAL LOW (ref 3.87–5.11)
RDW: 12.9 % (ref 11.7–15.4)
RDW: 11.8 % (ref 11.5–15.5)
WBC: 10.2 10*3/uL (ref 3.4–10.8)
WBC: 10.9 10*3/uL — ABNORMAL HIGH (ref 4.0–10.5)
nRBC: 0 % (ref 0.0–0.2)

## 2019-11-18 LAB — PROTEIN / CREATININE RATIO, URINE
Creatinine, Urine: 130.07 mg/dL
Protein Creatinine Ratio: 0.15 mg/mg{Cre} (ref 0.00–0.15)
Total Protein, Urine: 19 mg/dL

## 2019-11-18 LAB — COMPREHENSIVE METABOLIC PANEL
ALT: 76 IU/L — ABNORMAL HIGH (ref 0–32)
ALT: 76 U/L — ABNORMAL HIGH (ref 0–44)
AST: 65 IU/L — ABNORMAL HIGH (ref 0–40)
AST: 63 U/L — ABNORMAL HIGH (ref 15–41)
Albumin/Globulin Ratio: 1.8 (ref 1.2–2.2)
Albumin: 3.8 g/dL — ABNORMAL LOW (ref 3.9–5.0)
Albumin: 2.9 g/dL — ABNORMAL LOW (ref 3.5–5.0)
Alkaline Phosphatase: 61 IU/L (ref 48–121)
Alkaline Phosphatase: 50 U/L (ref 38–126)
Anion gap: 10 (ref 5–15)
BUN/Creatinine Ratio: 4 — ABNORMAL LOW (ref 9–23)
BUN: 3 mg/dL — ABNORMAL LOW (ref 6–20)
BUN: <5 mg/dL — ABNORMAL LOW (ref 6–20)
Bilirubin Total: 1 mg/dL (ref 0.0–1.2)
CO2: 23 mmol/L (ref 20–29)
CO2: 20 mmol/L — ABNORMAL LOW (ref 22–32)
Calcium: 8.7 mg/dL (ref 8.7–10.2)
Calcium: 8.1 mg/dL — ABNORMAL LOW (ref 8.9–10.3)
Chloride: 103 mmol/L (ref 96–106)
Chloride: 104 mmol/L (ref 98–111)
Creatinine, Ser: 0.72 mg/dL (ref 0.57–1.00)
Creatinine, Ser: 0.63 mg/dL (ref 0.44–1.00)
GFR calc Af Amer: 132 mL/min/{1.73_m2} (ref >59)
GFR calc Af Amer: >60 mL/min (ref >60)
GFR calc non Af Amer: 114 mL/min/{1.73_m2} (ref >59)
GFR calc non Af Amer: >60 mL/min (ref >60)
Globulin, Total: 2.1 g/dL (ref 1.5–4.5)
Glucose, Bld: 88 mg/dL (ref 70–99)
Glucose: 87 mg/dL (ref 65–99)
Potassium: 3.5 mmol/L (ref 3.5–5.2)
Potassium: 2.9 mmol/L — ABNORMAL LOW (ref 3.5–5.1)
Sodium: 136 mmol/L (ref 134–144)
Sodium: 134 mmol/L — ABNORMAL LOW (ref 135–145)
Total Bilirubin: 1.2 mg/dL (ref 0.3–1.2)
Total Protein: 5.9 g/dL — ABNORMAL LOW (ref 6.0–8.5)
Total Protein: 5.6 g/dL — ABNORMAL LOW (ref 6.5–8.1)

## 2019-11-18 LAB — TYPE AND SCREEN
ABO/RH(D): O POS
ABO/RH(D): O POS
Antibody Screen: NEGATIVE
Antibody Screen: NEGATIVE

## 2019-11-18 LAB — WET PREP, GENITAL
Clue Cells Wet Prep HPF POC: NONE SEEN
Sperm: NONE SEEN
Trich, Wet Prep: NONE SEEN
Yeast Wet Prep HPF POC: NONE SEEN

## 2019-11-18 MED ORDER — PROMETHAZINE HCL 25 MG/ML IJ SOLN
25.0000 mg | Freq: Four times a day (QID) | INTRAMUSCULAR | Status: DC | PRN
  Administered 2019-11-18 – 2019-11-20 (×2): 25 mg via INTRAVENOUS
  Filled 2019-11-18 (×2): qty 1

## 2019-11-18 MED ORDER — CALCIUM CARBONATE ANTACID 500 MG PO CHEW: 2.0000 | CHEWABLE_TABLET | ORAL | Status: DC | PRN

## 2019-11-18 MED ORDER — PRENATAL MULTIVITAMIN CH
1.0000 | ORAL_TABLET | Freq: Every day | ORAL | Status: DC
  Administered 2019-11-19: 1 via ORAL
  Filled 2019-11-18: qty 1

## 2019-11-18 MED ORDER — METOCLOPRAMIDE HCL 5 MG/ML IJ SOLN
10.0000 mg | Freq: Once | INTRAMUSCULAR | Status: AC
  Administered 2019-11-18: 10 mg via INTRAVENOUS
  Filled 2019-11-18: qty 2

## 2019-11-18 MED ORDER — ZOLPIDEM TARTRATE 5 MG PO TABS: 5.0000 mg | ORAL_TABLET | Freq: Every evening | ORAL | Status: DC | PRN

## 2019-11-18 MED ORDER — ACETAMINOPHEN 325 MG PO TABS: 650.0000 mg | ORAL_TABLET | ORAL | Status: DC | PRN

## 2019-11-18 MED ORDER — TERCONAZOLE 0.4 % VA CREA: 1.0000 | TOPICAL_CREAM | Freq: Every day | VAGINAL | 0 refills | Status: DC

## 2019-11-18 MED ORDER — DOCUSATE SODIUM 100 MG PO CAPS
100.0000 mg | ORAL_CAPSULE | Freq: Every day | ORAL | Status: DC
  Administered 2019-11-19: 100 mg via ORAL
  Filled 2019-11-18: qty 1

## 2019-11-18 MED ORDER — PANTOPRAZOLE SODIUM 40 MG IV SOLR
40.0000 mg | Freq: Once | INTRAVENOUS | Status: AC
  Administered 2019-11-18: 40 mg via INTRAVENOUS
  Filled 2019-11-18: qty 40

## 2019-11-18 NOTE — MAU Note (Signed)
Pt refused fetal heart rate monitoring after returning from bathroom.   Provider Sabas Sous, CNM made aware

## 2019-11-18 NOTE — MAU Provider Note (Signed)
History     CSN: [id number]  Arrival date and time: 11/18/19 1355   First Provider Initiated Contact with Patient 11/18/19 1458      Chief Complaint  Patient presents with  . Hypertension   Leslie Yates is a 29 y.o. G1P0 at [redacted]w[redacted]d who receives care at [facility].  She presents today for Hypertension.  She was seen in the office today for repeat labs and reported no improvement in symptoms since last hospital admission.  Patient endorses fetal movement and denies contractions.  She reports vaginal discharge that "looks like salt crystals."  However, patient denies itching, burning, or odor.       OB History    Gravida  1   Para  0   Term  0   Preterm  0   AB  0   Living        SAB  0   TAB  0   Ectopic  0   Multiple      Live Births              Past Medical History:  Diagnosis Date  . Asthma   . Obesity   . Ovarian cyst   . Sickle cell trait [facility])     Past Surgical History:  Procedure Laterality Date  . INCISION AND DRAINAGE ABSCESS      Family History  Problem Relation Age of Onset  . Diabetes Mother   . Hypertension Mother   . Diabetes Father   . Hypertension Father     Social History   Tobacco Use  . Smoking status: Current Every Day Smoker    Packs/day: 0.25    Years: 11.00    Pack years: 2.75    Types: Cigarettes  . Smokeless tobacco: Never Used  Substance Use Topics  . Alcohol use: Not Currently    Comment: occ  . Drug use: Not Currently    Types: Benzodiazepines, Marijuana    Comment: States she never fill prescription, last time marijuana use yesterday    Allergies:  Allergies  Allergen Reactions  . Penicillins Rash    Childhood rash Has patient had a PCN reaction causing immediate rash, facial/tongue/throat swelling, SOB or lightheadedness with hypotension: no Has patient had a PCN reaction causing severe rash involving mucus membranes or skin necrosis:no Has patient had a PCN reaction that required  hospitalization no Has patient had a PCN reaction occurring within the last 10 years: no If all of the above answers are "NO", then may proceed with Cephalosporin use.     Medications Prior to Admission  Medication Sig Dispense Refill Last Dose  . acetaminophen (TYLENOL) 325 MG tablet Take 650 mg by mouth every 6 (six) hours as needed for mild pain or headache.   Past Month at Unknown time  . aspirin 81 MG chewable tablet Chew 1 tablet (81 mg total) by mouth daily. 60 tablet 1 Past Week at Unknown time  . Blood Pressure Monitoring (BLOOD PRESSURE MONITOR AUTOMAT) DEVI 1 Device by Does not apply route daily. Automatic blood pressure cuff regular/x-large size cuff. To monitor blood pressure regularly at home. ICD-10 code: O09.90. 1 each 0 11/18/2019 at Unknown time  . hydrocortisone-pramoxine (PROCTOFOAM-HC) rectal foam Place 1 applicator rectally 2 (two) times daily. 10 g 6 Past Month at Unknown time  . ondansetron (ZOFRAN ODT) 4 MG disintegrating tablet Take 1-2 tablets (4-8 mg total) by mouth every 6 (six) hours as needed for nausea or vomiting. For nausea and vomiting  not relieved by Phenergan 30 tablet 2 11/18/2019 at Unknown time  . potassium chloride SA (KLOR-CON) 20 MEQ tablet Take 1 tablet (20 mEq total) by mouth 2 (two) times daily. 4 tablet 0 Past Week at Unknown time  . Prenatal Vit-DSS-Fe Fum-FA (PRENATAL 19) 29-1 MG TABS Take 1 tablet by mouth daily. 30 tablet 12 Past Week at Unknown time  . promethazine (PHENERGAN) 25 MG tablet Take 1 tablet (25 mg total) by mouth every 6 (six) hours as needed for nausea or vomiting. 30 tablet 0 11/17/2019 at Unknown time  . scopolamine (TRANSDERM-SCOP, 1.5 MG,) 1 MG/3DAYS Place 1 patch (1.5 mg total) onto the skin every 3 (three) days. 10 patch 12 Past Week at Unknown time  . Misc. Devices (GOJJI WEIGHT SCALE) MISC 1 each by Does not apply route daily as needed. Patient to weight daily as needed. ICD-10 code: O09.90 1 each 0     Review of Systems   Genitourinary: Positive for vaginal discharge. Negative for difficulty urinating, dysuria and vaginal pain.   Physical Exam   Blood pressure 137/73, pulse 82, temperature 98.3 F (36.8 C), temperature source Oral, resp. rate 16, last menstrual period 02/28/2019, SpO2 100 %.  Physical Exam  Constitutional: She is oriented to person, place, and time. She appears well-developed and well-nourished. No distress.  HENT:  Head: Normocephalic and atraumatic.  Eyes: Conjunctivae are normal.  Cardiovascular: Normal rate.  Respiratory: Effort normal.  Genitourinary: Cervix exhibits no discharge and no friability.    Vaginal discharge present.     No vaginal bleeding.  No bleeding in the vagina.    Genitourinary Comments: Speculum Exam: -Normal External Genitalia: Non tender, no apparent discharge at introitus.  -Vaginal Vault: Pink mucosa with good rugae. Moderate amt white curdy discharge -wet prep collected -Cervix:Pink, no lesions, cysts, or polyps.  Appears closed. No active bleeding from os-GC/CT collected -Bimanual Exam:  Closed    Musculoskeletal:     Cervical back: Normal range of motion.  Neurological: She is alert and oriented to person, place, and time.  Skin: Skin is warm and dry.  Psychiatric: She has a normal mood and affect. Her behavior is normal.    Fetal Assessment 135 bpm, Mod Var, -Decels, +Accels Toco: Irritability with Mild Ctx  MAU Course  No results found for this or any previous visit (from the past 24 hour(s)). [place] MFM FETAL BPP WO NON STRESS  Result Date: 11/17/2019 ----------------------------------------------------------------------  OBSTETRICS REPORT                       (Signed Final 11/17/2019 03:51 pm) ---------------------------------------------------------------------- Patient Info  ID #:       [id number]                          D.O.B.:  04-14-1991 (28 yrs)  Name:       Leslie Yates                   Visit Date: 11/17/2019 03:19 pm  ---------------------------------------------------------------------- Performed By  Attending:        Ma Rings MD         Ref. Address:     [address]  Performed By:     Marcellina Millin          Secondary Phy.:   [facility]                    [facility]                                                             Care  Referred By:      Ivory Broad DAVIS          [facility] ---------------------------------------------------------------------- Orders  #  Description                           Code        Ordered By  1  [place] MFM FETAL BPP WO NON               76819.01    KELLY DAVIS     STRESS ----------------------------------------------------------------------  #  Order #                     Accession #                Episode #  1  [id number]                   [id number]                 [id number] ---------------------------------------------------------------------- Indications  Hypertension - Gestational                     O13.9  Obesity complicating pregnancy, third          O99.213  trimester  [redacted] weeks gestation of pregnancy                Z3A.34  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[facility], [place]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)             Z14.8  Tobacco use complicating pregnancy, third      O99.333  trimester (1/2 PPD)  Ovarian cyst complicating pregnancy            O34.80  Substance abuse affecting pregnancy,           O99.320 F19.10  antepartum (THC)  Hyperemesis gravidarum                         O21.0  Medical complication of pregnancy (seizures    O26.90  induced by stress- new condition) ---------------------------------------------------------------------- Fetal Evaluation  Num Of Fetuses:         1  Fetal Heart Rate(bpm):  127  Cardiac Activity:       Observed  Presentation:           Cephalic  Placenta:  Anterior  Amniotic Fluid  AFI FV:      Within normal limits  AFI Sum(cm)      %Tile       Largest Pocket(cm)  12.3            37          4.4  RUQ(cm)       RLQ(cm)       LUQ(cm)        LLQ(cm)  4.4           2.3           1.8            3.8 ---------------------------------------------------------------------- Biophysical Evaluation  Amniotic F.V:   Within normal limits       F. Tone:        Observed  F. Movement:    Observed                   Score:          8/8  F. Breathing:   Observed ---------------------------------------------------------------------- OB History  Gravidity:    1         Term:   0        Prem:   0        SAB:   0  TOP:          0       Ectopic:  0        Living: 0 ---------------------------------------------------------------------- Gestational Age  LMP:           36w 0d        Date:  03/10/19                 EDD:   12/15/19  Best:          34w 5d     Det. By:  Marcella Dubs         EDD:   12/24/19                                      (06/09/19) ---------------------------------------------------------------------- Anatomy  Stomach:               Appears normal, left   Bladder:                Appears normal                         sided ---------------------------------------------------------------------- Comments  This patient was hospitalized due to a history of chronic  hypertension with persistent nausea/vomiting and seizures.  She has been cleared by neurology.  A biophysical profile performed today was 8 out of 8.  There was normal amniotic fluid noted on today's ultrasound  exam. ----------------------------------------------------------------------                   Ma Rings, MD Electronically Signed Final Report   11/17/2019 03:51 pm ----------------------------------------------------------------------   MDM PE Labs: PIH EFM  Assessment and Plan  29 year old G1P0  SIUP at 34.6weeks Cat I FT Repeat Labs  -POC reviewed -Exam performed and findings discussed. -Informed that discharge is suspicious for yeast. -Will send treatment for  Terazol cream to pharmacy on file. -Will await lab results.  -Patient refuses continued fetal monitoring, but NST reactive.    Cherre Robins MSN, CNM 11/18/2019, 2:59 PM  Reassessment (4:28 PM) -LFTs return elevated -Dr. Earlene Plater states she will contact MFM for coordination of care.  Reassessment (5:21 PM)  -Dr. Earlene Plater states MFM unavailable. Advised: *Admit to OBSCU *Repeat PIH Labs in AM *Admit orders placed -Patient informed of POC and without questions or concerns.   Cherre Robins MSN, CNM Advanced Practice Provider, Center for [employer]

## 2019-11-18 NOTE — Telephone Encounter (Signed)
-----   Message from [name], CNM sent at 11/15/2019  7:54 PM EDT ----- Regarding: Patient needs phenergan infusions Hi Maciah Feeback! This patient was seen twice in the MAU over the weekend for nausea and vomiting. I think she would benefit from phenergan infusions; she has medicines but they seem to be wearing off. I placed the outpatient order; do you mind [facility] to get [facility]? The number is [phone].  Thank you so much, let me know if you have any questions.  Samara Deist

## 2019-11-18 NOTE — MAU Note (Signed)
Pt left room.   Gerrit Heck, CNM made aware

## 2019-11-18 NOTE — MAU Note (Signed)
Pt reports being seen in the office today and was told to come in for further evaluation.   Denies vaginal bleeding or LOF.   Pt reports salt crystals coming from her vagina.   Denies LOF.   Reports +FM

## 2019-11-18 NOTE — MAU Note (Signed)
Pt back in room.

## 2019-11-18 NOTE — Progress Notes (Signed)
   Subjective:  Leslie Yates is a 29 y.o. female here for BP check and STAT Pre-eclampsia labs per Dr. Jinny Sanders orders 11/17/19. Patient [facility]/17/19. Patient complaint of abdominal cramping when trying to lay on back.  Hypertension ROS: no TIA's, no chest pain on exertion, no dyspnea on exertion, no swelling of ankles, no orthostatic dizziness or lightheadedness, no orthopnea or paroxysmal nocturnal dyspnea and no palpitations.    Objective:  BP 123/79   Pulse 79   Temp 98 F (36.7 C)   Wt 247 lb 3.2 oz (112.1 kg)   LMP 02/28/2019 (Approximate) Comment: neg preg test 10/27/2018  BMI 33.53 kg/m  FHT: 150 Appearance acyanotic, in no respiratory distress, crying and weakness. General exam BP noted to be well controlled today in office.  Discussed with patient the importance of returning to the hospital for her symptoms.    Assessment:   Blood Pressure well controlled, stable and asymptomatic.  Patient SOB Positive for nausea Positive decrease appetite  Plan:  Orders and follow up as documented in patient record. Very strongly urged to quit smoking to reduce cardiovascular risk. STAT pre-eclampisa labs ordered per Dr. Jinny Sanders orders 11/17/19. Patient stated she will return to MAU once she go home to get her blanket and teddy bear. She was going to have her mom take her back to MAU.  Clovis Pu, RN

## 2019-11-18 NOTE — Progress Notes (Signed)
Ms. Leslie Yates is a 29 y.o. G1P0000 female at [redacted]w[redacted]d gestation presenting to the office for stat PEC labs. She was admitted on 11/16/2019 to [facility] for questionable seizure activity. I spoke with  the patient in the office. She stated that after she spoke to Dr. Earlene Plater about gHTN and the possibility of developing PEC and all the possibilities, she got scared and wanted to go home. She wanted to go home to "say goodbye" to her family. I explained to her that a dx of PEC has not been made, but with the lab results PEC is a possible outcome. I also told her being in the hospital is the BEST place to be if, something adverse did arise. She reports still feeling bad, weak, nauseated, no appetite and unable to eat/drink anything. Stat labs drawn today -- all stat with the exception of P/C ratio. She is considering going back to the hospital for readmission. Dr. Earlene Plater notified via in-basket. MAU provider notified by phone.  BP 123/79   Pulse 79   Temp 98 F (36.7 C)   Wt 247 lb 3.2 oz (112.1 kg)   LMP 02/28/2019 (Approximate)   BMI 33.53 kg/m    FHTs by doppler: 150 bpm  [name], CNM  11/18/2019 1:21 PM

## 2019-11-18 NOTE — Telephone Encounter (Signed)
Pt was here today for labs and blood pressure check.  Offered to [facility] for [facility].  Pt stated that she will be going back to the hospital.  Pt is scheduled to follow up here in office on 11/26/2019 and scheduled for induction on 12/03/2019.

## 2019-11-19 DIAGNOSIS — F17210 Nicotine dependence, cigarettes, uncomplicated: Secondary | ICD-10-CM | POA: Diagnosis present

## 2019-11-19 DIAGNOSIS — Z3A35 35 weeks gestation of pregnancy: Secondary | ICD-10-CM | POA: Diagnosis not present

## 2019-11-19 DIAGNOSIS — O9902 Anemia complicating childbirth: Secondary | ICD-10-CM | POA: Diagnosis present

## 2019-11-19 DIAGNOSIS — R748 Abnormal levels of other serum enzymes: Secondary | ICD-10-CM | POA: Diagnosis present

## 2019-11-19 DIAGNOSIS — O99891 Other specified diseases and conditions complicating pregnancy: Secondary | ICD-10-CM | POA: Diagnosis not present

## 2019-11-19 DIAGNOSIS — Z880 Allergy status to penicillin: Secondary | ICD-10-CM | POA: Diagnosis not present

## 2019-11-19 DIAGNOSIS — O1424 HELLP syndrome, complicating childbirth: Secondary | ICD-10-CM | POA: Diagnosis not present

## 2019-11-19 DIAGNOSIS — D573 Sickle-cell trait: Secondary | ICD-10-CM | POA: Diagnosis present

## 2019-11-19 DIAGNOSIS — Z3A00 Weeks of gestation of pregnancy not specified: Secondary | ICD-10-CM | POA: Diagnosis not present

## 2019-11-19 DIAGNOSIS — O134 Gestational [pregnancy-induced] hypertension without significant proteinuria, complicating childbirth: Secondary | ICD-10-CM | POA: Diagnosis not present

## 2019-11-19 DIAGNOSIS — J45909 Unspecified asthma, uncomplicated: Secondary | ICD-10-CM | POA: Diagnosis not present

## 2019-11-19 DIAGNOSIS — O99334 Smoking (tobacco) complicating childbirth: Secondary | ICD-10-CM | POA: Diagnosis present

## 2019-11-19 DIAGNOSIS — Z3A34 34 weeks gestation of pregnancy: Secondary | ICD-10-CM | POA: Diagnosis not present

## 2019-11-19 DIAGNOSIS — O9952 Diseases of the respiratory system complicating childbirth: Secondary | ICD-10-CM | POA: Diagnosis not present

## 2019-11-19 DIAGNOSIS — O133 Gestational [pregnancy-induced] hypertension without significant proteinuria, third trimester: Secondary | ICD-10-CM | POA: Diagnosis not present

## 2019-11-19 HISTORY — DX: Abnormal levels of other serum enzymes: R74.8

## 2019-11-19 LAB — CBC
HCT: 33.2 % — ABNORMAL LOW (ref 36.0–46.0)
Hemoglobin: 11.6 g/dL — ABNORMAL LOW (ref 12.0–15.0)
MCH: 31.3 pg (ref 26.0–34.0)
MCHC: 34.9 g/dL (ref 30.0–36.0)
MCV: 89.5 fL (ref 80.0–100.0)
Platelets: 218 10*3/uL (ref 150–400)
RBC: 3.71 MIL/uL — ABNORMAL LOW (ref 3.87–5.11)
RDW: 11.9 % (ref 11.5–15.5)
WBC: 11.1 10*3/uL — ABNORMAL HIGH (ref 4.0–10.5)
nRBC: 0 % (ref 0.0–0.2)

## 2019-11-19 LAB — COMPREHENSIVE METABOLIC PANEL
ALT: 110 U/L — ABNORMAL HIGH (ref 0–44)
AST: 89 U/L — ABNORMAL HIGH (ref 15–41)
Albumin: 3.1 g/dL — ABNORMAL LOW (ref 3.5–5.0)
Alkaline Phosphatase: 54 U/L (ref 38–126)
Anion gap: 10 (ref 5–15)
BUN: <5 mg/dL — ABNORMAL LOW (ref 6–20)
CO2: 22 mmol/L (ref 22–32)
Calcium: 8.5 mg/dL — ABNORMAL LOW (ref 8.9–10.3)
Chloride: 104 mmol/L (ref 98–111)
Creatinine, Ser: 0.67 mg/dL (ref 0.44–1.00)
GFR calc Af Amer: >60 mL/min (ref >60)
GFR calc non Af Amer: >60 mL/min (ref >60)
Glucose, Bld: 97 mg/dL (ref 70–99)
Potassium: 3.7 mmol/L (ref 3.5–5.1)
Sodium: 136 mmol/L (ref 135–145)
Total Bilirubin: 1 mg/dL (ref 0.3–1.2)
Total Protein: 6 g/dL — ABNORMAL LOW (ref 6.5–8.1)

## 2019-11-19 LAB — PROTEIN / CREATININE RATIO, URINE
Creatinine, Urine: 224.3 mg/dL
Protein, Ur: 63.6 mg/dL
Protein/Creat Ratio: 284 mg/g creat — ABNORMAL HIGH (ref 0–200)

## 2019-11-19 LAB — GC/CHLAMYDIA PROBE AMP (~~LOC~~) NOT AT ARMC
Chlamydia: NEGATIVE
Comment: NEGATIVE
Comment: NORMAL
Neisseria Gonorrhea: NEGATIVE

## 2019-11-19 LAB — MAGNESIUM: Magnesium: 1.7 mg/dL (ref 1.7–2.4)

## 2019-11-19 MED ORDER — POTASSIUM CHLORIDE CRYS ER 20 MEQ PO TBCR: 40.0000 meq | EXTENDED_RELEASE_TABLET | ORAL | Status: DC

## 2019-11-19 MED ORDER — POTASSIUM CHLORIDE 10 MEQ/100ML IV SOLN
10.0000 meq | INTRAVENOUS | Status: AC
  Administered 2019-11-19 (×5): 10 meq via INTRAVENOUS
  Filled 2019-11-19 (×5): qty 100

## 2019-11-19 MED ORDER — BETAMETHASONE SOD PHOS & ACET 6 (3-3) MG/ML IJ SUSP
12.0000 mg | INTRAMUSCULAR | Status: AC
  Administered 2019-11-19 – 2019-11-20 (×2): 12 mg via INTRAMUSCULAR
  Filled 2019-11-19: qty 5

## 2019-11-19 MED ORDER — SODIUM CHLORIDE 0.9 % IV SOLN: INTRAVENOUS | Status: DC

## 2019-11-19 MED ORDER — POTASSIUM CHLORIDE CRYS ER 20 MEQ PO TBCR: 60.0000 meq | EXTENDED_RELEASE_TABLET | Freq: Once | ORAL | Status: DC

## 2019-11-19 MED ORDER — PANTOPRAZOLE SODIUM 40 MG IV SOLR
40.0000 mg | INTRAVENOUS | Status: DC
  Administered 2019-11-19 – 2019-11-21 (×3): 40 mg via INTRAVENOUS
  Filled 2019-11-19 (×4): qty 40

## 2019-11-19 MED ORDER — SODIUM CHLORIDE 0.9 % IV SOLN: INTRAVENOUS | Status: DC | PRN

## 2019-11-19 MED ORDER — METOCLOPRAMIDE HCL 5 MG/ML IJ SOLN
10.0000 mg | Freq: Three times a day (TID) | INTRAMUSCULAR | Status: DC
  Administered 2019-11-19 – 2019-11-21 (×4): 10 mg via INTRAVENOUS
  Filled 2019-11-19 (×4): qty 2

## 2019-11-19 NOTE — Progress Notes (Signed)
Patient resting. Fetal monitoring refused tonight by patient.

## 2019-11-19 NOTE — Progress Notes (Signed)
Down to see patient. She is packed up and ready to leave AMA.   I reviewed her course with patient and her fiance, concern for progressing pre-eclampsia vs HELLP. Reviewed plan for repeat BTMZ and lab work in am and then make a plan for delivery based on am labs. Reviewed that she could progress very quickly. Patient verbalizes understanding and is agreeable to stay. Requesting to be able to ambulate through hospital, reviewed that we do not want her leaving the unit, however, if she chooses to leave the unit and return, we will not call security. She agrees to monitoring in a little bit. Verbalizes understanding of the plan.   Baldemar Lenis, M.D. Attending [facility] [employer] [facility])

## 2019-11-19 NOTE — H&P (Signed)
Chief Complaint  Patient presents with  . Hypertension   Leslie Yates is a 29 y.o. G1P0 at [redacted]w[redacted]d who receives care at [facility]. She presents today for Hypertension. She was seen in the office today for repeat labs and reported no improvement in symptoms since last hospital admission. Patient endorses fetal movement and denies contractions. She reports vaginal discharge that "looks like salt crystals." However, patient denies itching, burning, or odor.           OB History     Gravida Para Term Preterm AB Living   1 0 0 0 0     SAB TAB Ectopic Multiple Live Births    0 0 0             Past Medical History:  Diagnosis Date  . Asthma   . Obesity   . Ovarian cyst   . Sickle cell trait [facility])         Past Surgical History:  Procedure Laterality Date  . INCISION AND DRAINAGE ABSCESS          Family History  Problem Relation Age of Onset  . Diabetes Mother   . Hypertension Mother   . Diabetes Father   . Hypertension Father    Social History        Tobacco Use  . Smoking status: Current Every Day Smoker    Packs/day: 0.25    Years: 11.00    Pack years: 2.75    Types: Cigarettes  . Smokeless tobacco: Never Used  Substance Use Topics  . Alcohol use: Not Currently    Comment: occ  . Drug use: Not Currently    Types: Benzodiazepines, Marijuana    Comment: States she never fill prescription, last time marijuana use yesterday   Allergies:       Allergies  Allergen Reactions  . Penicillins Rash    Childhood rash  Has patient had a PCN reaction causing immediate rash, facial/tongue/throat swelling, SOB or lightheadedness with hypotension: no  Has patient had a PCN reaction causing severe rash involving mucus membranes or skin necrosis:no  Has patient had a PCN reaction that required hospitalization no  Has patient had a PCN reaction occurring within the last 10 years: no  If all of the above answers are "NO", then may proceed with Cephalosporin use.            Medications Prior to Admission  Medication Sig Dispense Refill Last Dose  . acetaminophen (TYLENOL) 325 MG tablet Take 650 mg by mouth every 6 (six) hours as needed for mild pain or headache.   Past Month at Unknown time  . aspirin 81 MG chewable tablet Chew 1 tablet (81 mg total) by mouth daily. 60 tablet 1 Past Week at Unknown time  . Blood Pressure Monitoring (BLOOD PRESSURE MONITOR AUTOMAT) DEVI 1 Device by Does not apply route daily. Automatic blood pressure cuff regular/x-large size cuff. To monitor blood pressure regularly at home. ICD-10 code: O09.90. 1 each 0 11/18/2019 at Unknown time  . hydrocortisone-pramoxine (PROCTOFOAM-HC) rectal foam Place 1 applicator rectally 2 (two) times daily. 10 g 6 Past Month at Unknown time  . ondansetron (ZOFRAN ODT) 4 MG disintegrating tablet Take 1-2 tablets (4-8 mg total) by mouth every 6 (six) hours as needed for nausea or vomiting. For nausea and vomiting not relieved by Phenergan 30 tablet 2 11/18/2019 at Unknown time  . potassium chloride SA (KLOR-CON) 20 MEQ tablet Take 1 tablet (20 mEq total) by mouth 2 (two)  times daily. 4 tablet 0 Past Week at Unknown time  . Prenatal Vit-DSS-Fe Fum-FA (PRENATAL 19) 29-1 MG TABS Take 1 tablet by mouth daily. 30 tablet 12 Past Week at Unknown time  . promethazine (PHENERGAN) 25 MG tablet Take 1 tablet (25 mg total) by mouth every 6 (six) hours as needed for nausea or vomiting. 30 tablet 0 11/17/2019 at Unknown time  . scopolamine (TRANSDERM-SCOP, 1.5 MG,) 1 MG/3DAYS Place 1 patch (1.5 mg total) onto the skin every 3 (three) days. 10 patch 12 Past Week at Unknown time  . Misc. Devices (GOJJI WEIGHT SCALE) MISC 1 each by Does not apply route daily as needed. Patient to weight daily as needed. ICD-10 code: O09.90 1 each 0    Review of Systems  Genitourinary: Positive for vaginal discharge. Negative for difficulty urinating, dysuria and vaginal pain.   Physical Exam  Blood pressure 137/73, pulse 82, temperature 98.3  F (36.8 C), temperature source Oral, resp. rate 16, last menstrual period 02/28/2019, SpO2 100 %.  Physical Exam  Constitutional: She is oriented to person, place, and time. She appears well-developed and well-nourished. No distress.  HENT:  Head: Normocephalic and atraumatic.  Eyes: Conjunctivae are normal.  Cardiovascular: Normal rate.  Respiratory: Effort normal.  Genitourinary: Cervix exhibits no discharge and no friability. Vaginal discharge present.  No vaginal bleeding.  No bleeding in the vagina. Genitourinary Comments: Speculum Exam: -Normal External Genitalia: Non tender, no apparent discharge at introitus.  -Vaginal Vault: Pink mucosa with good rugae. Moderate amt white curdy discharge -wet prep collected -Cervix:Pink, no lesions, cysts, or polyps. Appears closed. No active bleeding from os-GC/CT collected -Bimanual Exam: Closed  Musculoskeletal:  Cervical back: Normal range of motion.  Neurological: She is alert and oriented to person, place, and time.  Skin: Skin is warm and dry.  Psychiatric: She has a normal mood and affect. Her behavior is normal.   Fetal Assessment  135 bpm, Mod Var, -Decels, +Accels  Toco: Irritability with Mild Ctx  MAU Course   Lab Results Last 24 Hours    [place] MFM FETAL BPP WO NON STRESS  Result Date: 11/17/2019  ---------------------------------------------------------------------- OBSTETRICS REPORT (Signed Final 11/17/2019 03:51 pm) ---------------------------------------------------------------------- Patient Info ID #: [id number] D.O.B.: December 08, 1990 (28 yrs) Name: Leslie Yates Rolph Visit Date: 11/17/2019 03:19 pm ---------------------------------------------------------------------- Performed By Attending: Ma Rings MD Ref. Address: [address] Performed By: Marcellina Millin Secondary Phy.: [facility] RDMS Care Referred By: Ivory Broad DAVIS [facility]  ---------------------------------------------------------------------- Orders # Description Code Ordered By 1 [place] MFM FETAL BPP WO NON 76819.01 KELLY DAVIS STRESS ---------------------------------------------------------------------- # Order # Accession # Episode # 1 [id number] ---------------------------------------------------------------------- Indications Hypertension - Gestational O13.9 Obesity complicating pregnancy, third O99.213 trimester [redacted] weeks gestation of pregnancy Z3A.34 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SMA, [place]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) Z14.8 Tobacco use complicating pregnancy, third O99.333 trimester (1/2 PPD) Ovarian cyst complicating pregnancy O34.80 Substance abuse affecting pregnancy, O99.320 F19.10 antepartum (THC) Hyperemesis gravidarum O21.0 Medical complication of pregnancy (seizures O26.90 induced by stress- new condition) ---------------------------------------------------------------------- Fetal Evaluation Num Of Fetuses: 1 Fetal Heart Rate(bpm): 127 Cardiac Activity: Observed Presentation: Cephalic Placenta: Anterior Amniotic Fluid AFI FV: Within normal limits AFI Sum(cm) %Tile Largest Pocket(cm) 12.3 37 4.4 RUQ(cm) RLQ(cm) LUQ(cm) LLQ(cm) 4.4 2.3 1.8 3.8 ---------------------------------------------------------------------- Biophysical Evaluation Amniotic F.V: Within normal limits F. Tone: Observed F. Movement: Observed Score: 8/8 F. Breathing: Observed ---------------------------------------------------------------------- OB History Gravidity: 1 Term: 0 Prem: 0 SAB: 0 TOP: 0 Ectopic: 0 Living: 0 ----------------------------------------------------------------------  Gestational Age LMP: 36w 0d Date: 03/10/19 EDD: 12/15/19 Best: 34w 5d Det. By: Marcella Dubs EDD: 12/24/19 (06/09/19) ---------------------------------------------------------------------- Anatomy Stomach: Appears normal, left Bladder: Appears normal sided ---------------------------------------------------------------------- Comments  This patient was hospitalized due to a history of chronic hypertension with persistent nausea/vomiting and seizures. She has been cleared by neurology. A biophysical profile performed today was 8 out of 8. There was normal amniotic fluid noted on today's ultrasound exam. ---------------------------------------------------------------------- Ma Rings, MD Electronically Signed Final Report 11/17/2019 03:51 pm ----------------------------------------------------------------------   MDM  PE  Labs: PIH  EFM  Assessment and Plan  29 year old G1P0  SIUP at 34.6weeks  Cat I FT  Repeat Labs  -POC reviewed  -Exam performed and findings discussed.  -Informed that discharge is suspicious for yeast.  -Will send treatment for Terazol cream to pharmacy on file.  -Will await lab results.  -Patient refuses continued fetal monitoring, but NST reactive.  Cherre Robins MSN, CNM  11/18/2019, 2:59 PM  Reassessment (4:28 PM)  -LFTs return elevated  -Dr. Earlene Plater states she will contact MFM for coordination of care.  Reassessment (5:21 PM)  -Dr. Earlene Plater states MFM unavailable. Advised:  *Admit to OBSCU  *Repeat PIH Labs in AM  *Admit orders placed  -Patient informed of POC and without questions or concerns.  Cherre Robins MSN, CNM  Advanced Practice Provider, Center for [facility]    Attestation of Attending Supervision of Advanced Practitioner (CNM/NP/PA): Evaluation and management procedures were performed by the Advanced Practitioner under my supervision and collaboration. I have reviewed the Advanced Practitioner's note and chart, and I agree with the management and plan.  Rockne Coons MD Attending Physician for the [facility] 11/19/2019 7:46 AM

## 2019-11-19 NOTE — Progress Notes (Signed)
Patient ID: LOVEY GARI, female   DOB: 19-Aug-1990, 29 y.o.   MRN: [id number] FACULTY PRACTICE ANTEPARTUM(COMPREHENSIVE) NOTE  Leslie Yates is a 29 y.o. G1P0000 with Estimated Date of Delivery: 12/24/19   By   [redacted]w[redacted]d  who is admitted for evolving hypertensive disorder of pregnancy, now technically GHTN with LFTs nearing criteria for HELLP.    Fetal presentation is cephalic.by sonogram 5/25 Length of Stay:  1  Days  Date of admission:11/18/2019  Subjective: Continues to have N/V Patient reports the fetal movement as active. Patient reports uterine contraction  activity as none. Patient reports  vaginal bleeding as none. Patient describes fluid per vagina as None.  Vitals:  Blood pressure (!) 146/98, pulse 85, temperature 97.9 F (36.6 C), temperature source Oral, resp. rate 18, height 6' (1.829 m), weight 110.5 kg, last menstrual period 02/28/2019, SpO2 99 %. Vitals:   11/18/19 1745 11/18/19 2048 11/19/19 0105 11/19/19 0617  BP:  (!) 152/85 134/90 (!) 146/98  Pulse:  89 89 85  Resp:  18 17 18   Temp:  98.8 F (37.1 C) 98.3 F (36.8 C) 97.9 F (36.6 C)  TempSrc:  Oral Oral Oral  SpO2:  99% 100% 99%  Weight: 110.5 kg     Height: 6' (1.829 m)      Physical Examination:  General appearance - alert, well appearing, and in no distress Abdomen - soft, nontender, nondistended, no masses or organomegaly Fundal Height:  size equals dates Pelvic Exam:  examination not indicated Cervical Exam: Not evaluated. . Extremities: extremities normal, atraumatic, no cyanosis or edema with DTRs 2+ bilaterally Membranes:intact  Fetal Monitoring:  Baseline: 140s bpm, Variability: Good {> 6 bpm), Accelerations: Reactive and Decelerations: Absent   reactive  Labs:  Results for orders placed or performed during the hospital encounter of 11/18/19 (from the past 24 hour(s))  CBC   Collection Time: 11/18/19  3:09 PM  Result Value Ref Range   WBC 10.9 (H) 4.0 - 10.5 K/uL   RBC 3.45 (L) 3.87 - 5.11 MIL/uL    Hemoglobin 10.8 (L) 12.0 - 15.0 g/dL   HCT 95.2 (L) 84.1 - 32.4 %   MCV 89.3 80.0 - 100.0 fL   MCH 31.3 26.0 - 34.0 pg   MCHC 35.1 30.0 - 36.0 g/dL   RDW 40.1 02.7 - 25.3 %   Platelets 192 150 - 400 K/uL   nRBC 0.0 0.0 - 0.2 %  Comprehensive metabolic panel   Collection Time: 11/18/19  3:09 PM  Result Value Ref Range   Sodium 134 (L) 135 - 145 mmol/L   Potassium 2.9 (L) 3.5 - 5.1 mmol/L   Chloride 104 98 - 111 mmol/L   CO2 20 (L) 22 - 32 mmol/L   Glucose, Bld 88 70 - 99 mg/dL   BUN <5 (L) 6 - 20 mg/dL   Creatinine, Ser 6.64 0.44 - 1.00 mg/dL   Calcium 8.1 (L) 8.9 - 10.3 mg/dL   Total Protein 5.6 (L) 6.5 - 8.1 g/dL   Albumin 2.9 (L) 3.5 - 5.0 g/dL   AST 63 (H) 15 - 41 U/L   ALT 76 (H) 0 - 44 U/L   Alkaline Phosphatase 50 38 - 126 U/L   Total Bilirubin 1.2 0.3 - 1.2 mg/dL   GFR calc non Af Amer >60 >60 mL/min   GFR calc Af Amer >60 >60 mL/min   Anion gap 10 5 - 15  Type and screen [facility]   Collection Time: 11/18/19  3:09 PM  Result Value Ref Range   ABO/RH(D) O POS    Antibody Screen NEG    Sample Expiration      11/21/2019,2359 Performed at [facility], [address], [place], [place] [address]   Wet prep, genital   Collection Time: 11/18/19  3:24 PM   Specimen: PATH Cytology Cervicovaginal Ancillary Only  Result Value Ref Range   Yeast Wet Prep HPF POC NONE SEEN NONE SEEN   Trich, Wet Prep NONE SEEN NONE SEEN   Clue Cells Wet Prep HPF POC NONE SEEN NONE SEEN   WBC, Wet Prep HPF POC FEW (A) NONE SEEN   Sperm NONE SEEN   Protein / creatinine ratio, urine   Collection Time: 11/18/19  3:43 PM  Result Value Ref Range   Creatinine, Urine 130.07 mg/dL   Total Protein, Urine 19 mg/dL   Protein Creatinine Ratio 0.15 0.00 - 0.15 mg/mg[Cre]  Results for orders placed or performed in visit on 11/18/19 (from the past 24 hour(s))  CBC   Collection Time: 11/18/19  1:30 PM  Result Value Ref Range   WBC 10.2 3.4 - 10.8 x10E3/uL   RBC 3.67 (L)  3.77 - 5.28 x10E6/uL   Hemoglobin 11.3 11.1 - 15.9 g/dL   Hematocrit 52.8 (L) 41.3 - 46.6 %   MCV 91 79 - 97 fL   MCH 30.8 26.6 - 33.0 pg   MCHC 34.0 31.5 - 35.7 g/dL   RDW 24.4 01.0 - 27.2 %   Platelets 205 150 - 450 x10E3/uL  Comprehensive metabolic panel   Collection Time: 11/18/19  1:30 PM  Result Value Ref Range   Glucose 87 65 - 99 mg/dL   BUN 3 (L) 6 - 20 mg/dL   Creatinine, Ser 5.36 0.57 - 1.00 mg/dL   GFR calc non Af Amer 114 >59 mL/min/1.73   GFR calc Af Amer 132 >59 mL/min/1.73   BUN/Creatinine Ratio 4 (L) 9 - 23   Sodium 136 134 - 144 mmol/L   Potassium 3.5 3.5 - 5.2 mmol/L   Chloride 103 96 - 106 mmol/L   CO2 23 20 - 29 mmol/L   Calcium 8.7 8.7 - 10.2 mg/dL   Total Protein 5.9 (L) 6.0 - 8.5 g/dL   Albumin 3.8 (L) 3.9 - 5.0 g/dL   Globulin, Total 2.1 1.5 - 4.5 g/dL   Albumin/Globulin Ratio 1.8 1.2 - 2.2   Bilirubin Total 1.0 0.0 - 1.2 mg/dL   Alkaline Phosphatase 61 48 - 121 IU/L   AST 65 (H) 0 - 40 IU/L   ALT 76 (H) 0 - 32 IU/L    Imaging Studies:    Overnight EEG with video  Result Date: 11/17/2019 Charlsie Quest, MD     11/18/2019  8:40 AM Patient Name: Leslie Yates MRN: [id number] Epilepsy Attending: Charlsie Quest Referring Physician/Provider: Dr. Georgiana Spinner Aroor Duration: 11/16/2019 1453 to 11/17/2019 1615 Patient history: 29 year old female who is [redacted] weeks pregnant presenting to hospital with seizure-like activity.  EEG to evaluate for seizures. Level of alertness: Awake, asleep AEDs during EEG study: None Technical aspects: This EEG study was done with scalp electrodes positioned according to the 10-20 International system of electrode placement. Electrical activity was acquired at a sampling rate of 500Hz  and reviewed with a high frequency filter of 70Hz  and a low frequency filter of 1Hz . EEG data were recorded continuously and digitally stored. Description: The posterior dominant rhythm consists of 9-10 Hz activity of moderate voltage (25-35 uV) seen  predominantly in posterior head regions, symmetric and reactive to eye opening and eye closing.  Sleep was characterized by vertex waves, sleep spindles (12 to 14 Hz), maximal frontocentral region.  Hyperventilation and photic stimulation were not performed.   Event button was pressed on 11/17/2019 at 0150.  On video patient was noted to be lying in the bed, hyperventilating and had nonrhythmic, whole-body twitching movements.  Concomitant EEG before during and after the episode showed normal posterior dominant rhythm without any EEG change to suggest seizure. IMPRESSION: This study is within normal limits. No seizures or epileptiform discharges were seen throughout the recording. Event button was pressed on 11/17/2019 at 0 150 as described above without concomitant EEG change.  This was a nonepileptic event. Charlsie Quest   [place] MFM FETAL BPP WO NON STRESS  Result Date: 11/17/2019 ----------------------------------------------------------------------  OBSTETRICS REPORT                       (Signed Final 11/17/2019 03:51 pm) ---------------------------------------------------------------------- Patient Info  ID #:       [id number]                          D.O.B.:  [birth year] (28 yrs)  Name:       Leslie Yates                   Visit Date: 11/17/2019 03:19 pm ---------------------------------------------------------------------- Performed By  Attending:        Ma Rings MD         Ref. Address:     [address]                                                             [address]  Performed By:     Marcellina Millin          Secondary Phy.:   [facility] OB [facility]                    RDMS                                                             Care  Referred By:      Ivory Broad DAVIS          [facility] ---------------------------------------------------------------------- Orders  #  Description                           Code         Ordered By  1  [place] MFM FETAL BPP WO NON  16109.60    KELLY DAVIS     STRESS ----------------------------------------------------------------------  #  Order #                     Accession #                Episode #  1  [id number]                   [id number]                 [id number] ---------------------------------------------------------------------- Indications  Hypertension - Gestational                     O13.9  Obesity complicating pregnancy, third          O99.213  trimester  [redacted] weeks gestation of pregnancy                Z3A.34  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SMA, [place]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)             Z14.8  Tobacco use complicating pregnancy, third      O99.333  trimester (1/2 PPD)  Ovarian cyst complicating pregnancy            O34.80  Substance abuse affecting pregnancy,           O99.320 F19.10  antepartum (THC)  Hyperemesis gravidarum                         O21.0  Medical complication of pregnancy (seizures    O26.90  induced by stress- new condition) ---------------------------------------------------------------------- Fetal Evaluation  Num Of Fetuses:         1  Fetal Heart Rate(bpm):  127  Cardiac Activity:       Observed  Presentation:           Cephalic  Placenta:               Anterior  Amniotic Fluid  AFI FV:      Within normal limits  AFI Sum(cm)     %Tile       Largest Pocket(cm)  12.3            37          4.4  RUQ(cm)       RLQ(cm)       LUQ(cm)        LLQ(cm)  4.4           2.3           1.8            3.8 ---------------------------------------------------------------------- Biophysical Evaluation  Amniotic F.V:   Within normal limits       F. Tone:        Observed  F. Movement:    Observed                   Score:          8/8  F. Breathing:   Observed ---------------------------------------------------------------------- OB History  Gravidity:    1         Term:   0        Prem:   0        SAB:   0  TOP:          0       Ectopic:  0        Living: 0  ----------------------------------------------------------------------  Gestational Age  LMP:           36w 0d        Date:  03/10/19                 EDD:   12/15/19  Best:          34w 5d     Det. By:  Marcella Dubs         EDD:   12/24/19                                      (06/09/19) ---------------------------------------------------------------------- Anatomy  Stomach:               Appears normal, left   Bladder:                Appears normal                         sided ---------------------------------------------------------------------- Comments  This patient was hospitalized due to a history of chronic  hypertension with persistent nausea/vomiting and seizures.  She has been cleared by neurology.  A biophysical profile performed today was 8 out of 8.  There was normal amniotic fluid noted on today's ultrasound  exam. ----------------------------------------------------------------------                   Ma Rings, MD Electronically Signed Final Report   11/17/2019 03:51 pm ----------------------------------------------------------------------    Medications:  Scheduled . betamethasone acetate-betamethasone sodium phosphate  12 mg Intramuscular Q24 Hr x 2  . docusate sodium  100 mg Oral Daily  . prenatal multivitamin  1 tablet Oral Q1200   I have reviewed the patient's current medications.  ASSESSMENT: G1P0000 [redacted]w[redacted]d Estimated Date of Delivery: 12/24/19  Gestational Hypertension, technically, but evolving clinical status to pre eclampsia with HELLP, pending ongoing LFT elevation Patient Active Problem List   Diagnosis Date Noted  . Gestational hypertension, third trimester 11/18/2019  . Gestational hypertension 11/17/2019  . Seizure (HCC) 11/16/2019  . Hemorrhoids during pregnancy in third trimester 10/29/2019  . Hyperemesis gravidarum 08/22/2019  . 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 Carrier of disease 07/01/2019  . Sickle cell trait  (HCC)   . Obesity in pregnancy, antepartum 06/12/2019  . Lump of breast 06/12/2019  . Supervision of high risk pregnancy, antepartum 06/01/2019  . Tobacco use 10/28/2018  . Cyclical vomiting with nausea 10/27/2018  . Asthma 10/27/2018  . Complex cyst of right ovary 05/10/2017    PLAN: >labs to be drawn this am once potassium has finished >Pt did not receive betamethasone last night, ordered for this am >twice daily fetal surveillance with NST  Pt aware may be moving toward IOL if LFTs continue to trend upward Slight headache this am, no visual changes  Liela Rylee H Para Cossey 11/19/2019,7:35 AM

## 2019-11-20 ENCOUNTER — Encounter (HOSPITAL_COMMUNITY): Payer: Self-pay | Admitting: Anesthesiology

## 2019-11-20 ENCOUNTER — Encounter (HOSPITAL_COMMUNITY): Payer: Self-pay | Admitting: Obstetrics and Gynecology

## 2019-11-20 ENCOUNTER — Encounter: Payer: Medicaid Other | Attending: Obstetrics

## 2019-11-20 DIAGNOSIS — O99891 Other specified diseases and conditions complicating pregnancy: Secondary | ICD-10-CM

## 2019-11-20 DIAGNOSIS — R748 Abnormal levels of other serum enzymes: Secondary | ICD-10-CM

## 2019-11-20 DIAGNOSIS — O133 Gestational [pregnancy-induced] hypertension without significant proteinuria, third trimester: Secondary | ICD-10-CM

## 2019-11-20 LAB — COMPREHENSIVE METABOLIC PANEL
ALT: 158 U/L — ABNORMAL HIGH (ref 0–44)
ALT: 163 U/L — ABNORMAL HIGH (ref 0–44)
AST: 118 U/L — ABNORMAL HIGH (ref 15–41)
AST: 108 U/L — ABNORMAL HIGH (ref 15–41)
Albumin: 3.3 g/dL — ABNORMAL LOW (ref 3.5–5.0)
Albumin: 3.2 g/dL — ABNORMAL LOW (ref 3.5–5.0)
Alkaline Phosphatase: 60 U/L (ref 38–126)
Alkaline Phosphatase: 54 U/L (ref 38–126)
Anion gap: 10 (ref 5–15)
Anion gap: 9 (ref 5–15)
BUN: <5 mg/dL — ABNORMAL LOW (ref 6–20)
BUN: <5 mg/dL — ABNORMAL LOW (ref 6–20)
CO2: 21 mmol/L — ABNORMAL LOW (ref 22–32)
CO2: 20 mmol/L — ABNORMAL LOW (ref 22–32)
Calcium: 8.9 mg/dL (ref 8.9–10.3)
Calcium: 8 mg/dL — ABNORMAL LOW (ref 8.9–10.3)
Chloride: 106 mmol/L (ref 98–111)
Chloride: 107 mmol/L (ref 98–111)
Creatinine, Ser: 0.73 mg/dL (ref 0.44–1.00)
Creatinine, Ser: 0.61 mg/dL (ref 0.44–1.00)
GFR calc Af Amer: >60 mL/min (ref >60)
GFR calc Af Amer: >60 mL/min (ref >60)
GFR calc non Af Amer: >60 mL/min (ref >60)
GFR calc non Af Amer: >60 mL/min (ref >60)
Glucose, Bld: 121 mg/dL — ABNORMAL HIGH (ref 70–99)
Glucose, Bld: 119 mg/dL — ABNORMAL HIGH (ref 70–99)
Potassium: 3.6 mmol/L (ref 3.5–5.1)
Potassium: 4.4 mmol/L (ref 3.5–5.1)
Sodium: 137 mmol/L (ref 135–145)
Sodium: 136 mmol/L (ref 135–145)
Total Bilirubin: 1.2 mg/dL (ref 0.3–1.2)
Total Bilirubin: 0.8 mg/dL (ref 0.3–1.2)
Total Protein: 6.4 g/dL — ABNORMAL LOW (ref 6.5–8.1)
Total Protein: 6.3 g/dL — ABNORMAL LOW (ref 6.5–8.1)

## 2019-11-20 LAB — HEPATITIS PANEL, ACUTE
HCV Ab: NONREACTIVE
Hep A IgM: NONREACTIVE
Hep B C IgM: NONREACTIVE
Hepatitis B Surface Ag: NONREACTIVE

## 2019-11-20 LAB — CBC
HCT: 33.6 % — ABNORMAL LOW (ref 36.0–46.0)
HCT: 32 % — ABNORMAL LOW (ref 36.0–46.0)
Hemoglobin: 11.9 g/dL — ABNORMAL LOW (ref 12.0–15.0)
Hemoglobin: 11.2 g/dL — ABNORMAL LOW (ref 12.0–15.0)
MCH: 31.9 pg (ref 26.0–34.0)
MCH: 31.1 pg (ref 26.0–34.0)
MCHC: 35.4 g/dL (ref 30.0–36.0)
MCHC: 35 g/dL (ref 30.0–36.0)
MCV: 90.1 fL (ref 80.0–100.0)
MCV: 88.9 fL (ref 80.0–100.0)
Platelets: 238 10*3/uL (ref 150–400)
Platelets: 226 10*3/uL (ref 150–400)
RBC: 3.73 MIL/uL — ABNORMAL LOW (ref 3.87–5.11)
RBC: 3.6 MIL/uL — ABNORMAL LOW (ref 3.87–5.11)
RDW: 12.1 % (ref 11.5–15.5)
RDW: 12.1 % (ref 11.5–15.5)
WBC: 17.6 10*3/uL — ABNORMAL HIGH (ref 4.0–10.5)
WBC: 14.1 10*3/uL — ABNORMAL HIGH (ref 4.0–10.5)
nRBC: 0 % (ref 0.0–0.2)
nRBC: 0 % (ref 0.0–0.2)

## 2019-11-20 LAB — FIBRINOGEN
Fibrinogen: 248 mg/dL (ref 210–475)
Fibrinogen: 252 mg/dL (ref 210–475)

## 2019-11-20 LAB — APTT
aPTT: 30 s (ref 24–36)
aPTT: 30 s (ref 24–36)

## 2019-11-20 LAB — PROTIME-INR
INR: 1.2 (ref 0.8–1.2)
INR: 1.2 (ref 0.8–1.2)
Prothrombin Time: 14.4 seconds (ref 11.4–15.2)
Prothrombin Time: 14.4 s (ref 11.4–15.2)

## 2019-11-20 MED ORDER — CEFAZOLIN SODIUM-DEXTROSE 1-4 GM/50ML-% IV SOLN
1.0000 g | Freq: Three times a day (TID) | INTRAVENOUS | Status: DC
  Administered 2019-11-20 – 2019-11-21 (×4): 1 g via INTRAVENOUS
  Filled 2019-11-20 (×6): qty 50

## 2019-11-20 MED ORDER — LACTATED RINGERS IV SOLN
500.0000 mL | INTRAVENOUS | Status: DC | PRN
  Administered 2019-11-21: 500 mL via INTRAVENOUS

## 2019-11-20 MED ORDER — LABETALOL HCL 5 MG/ML IV SOLN: 20.0000 mg | INTRAVENOUS | Status: DC | PRN

## 2019-11-20 MED ORDER — CEFAZOLIN SODIUM-DEXTROSE 2-4 GM/100ML-% IV SOLN
2.0000 g | Freq: Once | INTRAVENOUS | Status: AC
  Administered 2019-11-20: 2 g via INTRAVENOUS
  Filled 2019-11-20: qty 100

## 2019-11-20 MED ORDER — ONDANSETRON HCL 4 MG/2ML IJ SOLN: 4.0000 mg | Freq: Four times a day (QID) | INTRAMUSCULAR | Status: DC | PRN

## 2019-11-20 MED ORDER — MISOPROSTOL 50MCG HALF TABLET
50.0000 ug | ORAL_TABLET | ORAL | Status: DC
  Administered 2019-11-20 – 2019-11-21 (×3): 50 ug via ORAL
  Filled 2019-11-20 (×3): qty 1

## 2019-11-20 MED ORDER — SODIUM CHLORIDE 0.9 % IV SOLN: 25.0000 mg | INTRAVENOUS | Status: DC

## 2019-11-20 MED ORDER — HYDRALAZINE HCL 20 MG/ML IJ SOLN: 10.0000 mg | INTRAMUSCULAR | Status: DC | PRN

## 2019-11-20 MED ORDER — MAGNESIUM SULFATE 40 GM/1000ML IV SOLN
2.0000 g/h | INTRAVENOUS | Status: AC
  Administered 2019-11-20 – 2019-11-22 (×4): 2 g/h via INTRAVENOUS
  Filled 2019-11-20 (×4): qty 1000

## 2019-11-20 MED ORDER — SOD CITRATE-CITRIC ACID 500-334 MG/5ML PO SOLN: 30.0000 mL | ORAL | Status: DC | PRN

## 2019-11-20 MED ORDER — OXYTOCIN BOLUS FROM INFUSION
500.0000 mL | Freq: Once | INTRAVENOUS | Status: AC
  Administered 2019-11-21: 999 mL/h via INTRAVENOUS

## 2019-11-20 MED ORDER — LIDOCAINE HCL (PF) 1 % IJ SOLN: 30.0000 mL | INTRAMUSCULAR | Status: DC | PRN

## 2019-11-20 MED ORDER — LACTATED RINGERS IV SOLN: INTRAVENOUS | Status: DC

## 2019-11-20 MED ORDER — FENTANYL CITRATE (PF) 100 MCG/2ML IJ SOLN
50.0000 ug | INTRAMUSCULAR | Status: DC | PRN
  Administered 2019-11-20 (×2): 100 ug via INTRAVENOUS
  Filled 2019-11-20 (×2): qty 2

## 2019-11-20 MED ORDER — LABETALOL HCL 5 MG/ML IV SOLN: 80.0000 mg | INTRAVENOUS | Status: DC | PRN

## 2019-11-20 MED ORDER — MAGNESIUM SULFATE BOLUS VIA INFUSION
4.0000 g | Freq: Once | INTRAVENOUS | Status: AC
  Administered 2019-11-20: 4 g via INTRAVENOUS
  Filled 2019-11-20: qty 1000

## 2019-11-20 MED ORDER — LABETALOL HCL 5 MG/ML IV SOLN: 40.0000 mg | INTRAVENOUS | Status: DC | PRN

## 2019-11-20 MED ORDER — MISOPROSTOL 50MCG HALF TABLET
ORAL_TABLET | ORAL | Status: AC
  Administered 2019-11-20: 50 ug
  Filled 2019-11-20: qty 1

## 2019-11-20 MED ORDER — OXYTOCIN 40 UNITS IN NORMAL SALINE INFUSION - SIMPLE MED: 2.5000 [IU]/h | INTRAVENOUS | Status: DC

## 2019-11-20 NOTE — Consult Note (Signed)
MFM Note  Leslie Yates is a 29 year old gravida 1 para 0 currently at 35 weeks and 1 day who was seen in consultation due to chronic hypertension with probable superimposed preeclampsia.  The patient was readmitted 2 days ago due to hypertension at which time her liver function tests were noted to be increased.  She is receiving a complete course of antenatal corticosteroids.   Over the past 2 days, her liver function tests have continued to increase.  Her AST yesterday was 89.  It was 118 this morning.  Her ALT was 110 yesterday.  It was 158 this morning.  Her liver function tests were within normal limits when she signed out AGAINST MEDICAL ADVICE 3 days ago.  Her blood pressures have remained in the 120s to 140s over 80s to 90s range.  The patient is currently asymptomatic.    Due to the continued rise in her liver function tests, it is likely that she has developed severe preeclampsia/HELLP syndrome.    At her current gestational age I agree with delivery at this time.  She is receiving magnesium sulfate for maternal seizure prophylaxis.    Both the patient and her partner agree with the plan for delivery.

## 2019-11-20 NOTE — Progress Notes (Signed)
Call to L/D charge. Report given. Pt. To go to room 216

## 2019-11-20 NOTE — Progress Notes (Signed)
Pt transferred to L&D in stable condition. FHR stable 135. Carmelina Dane, RN

## 2019-11-20 NOTE — Progress Notes (Signed)
FACULTY PRACTICE ANTEPARTUM PROGRESS NOTE  Leslie Yates is a 29 y.o. G1P0000 at [redacted]w[redacted]d who is admitted for chronic hypertension with superimposed pre-eclampsia.  Estimated Date of Delivery: 12/24/19 Fetal presentation is cephalic.  Length of Stay:  2 Days. Admitted 11/18/2019  Subjective: Patient feeling well this am, was nauseous this morning but otherwise feeling well. Patient reports normal fetal movement.  She denies uterine contractions, denies bleeding and leaking of fluid per vagina.  Vitals:  Blood pressure 114/68, pulse (!) 193, temperature 98.2 F (36.8 C), temperature source Oral, resp. rate 18, height 6' (1.829 m), weight 110.5 kg, last menstrual period 02/28/2019, SpO2 98 %. Physical Examination: CONSTITUTIONAL: Well-developed, well-nourished female in no acute distress.  HENT:  Normocephalic, atraumatic, External right and left ear normal. Oropharynx is clear and moist EYES: Conjunctivae and EOM are normal. Pupils are equal, round, and reactive to light. No scleral icterus.  NECK: Normal range of motion, supple, no masses. SKIN: Skin is warm and dry. No rash noted. Not diaphoretic. No erythema. No pallor. NEUROLGIC: Alert and oriented to person, place, and time. Normal reflexes, muscle tone coordination. No cranial nerve deficit noted. PSYCHIATRIC: Normal mood and affect. Normal behavior. Normal judgment and thought content. CARDIOVASCULAR: Normal heart rate noted RESPIRATORY: Effort normal, no problems with respiration noted MUSCULOSKELETAL: Normal range of motion. No edema and no tenderness. ABDOMEN: Soft, nontender, nondistended, gravid. CERVIX: deferred  Fetal monitoring: last monitoring yesterday, refused monitoring this am  Results for orders placed or performed during the hospital encounter of 11/18/19 (from the past 48 hour(s))  GC/Chlamydia probe amp ([place])not at [facility]     Status: None   Collection Time: 11/18/19  2:55 PM  Result Value Ref Range   Neisseria  Gonorrhea Negative    Chlamydia Negative    Comment Normal Reference Ranger Chlamydia - Negative    Comment      Normal Reference Range Neisseria Gonorrhea - Negative  CBC     Status: Abnormal   Collection Time: 11/18/19  3:09 PM  Result Value Ref Range   WBC 10.9 (H) 4.0 - 10.5 K/uL   RBC 3.45 (L) 3.87 - 5.11 MIL/uL   Hemoglobin 10.8 (L) 12.0 - 15.0 g/dL   HCT 79.0 (L) 24.0 - 97.3 %   MCV 89.3 80.0 - 100.0 fL   MCH 31.3 26.0 - 34.0 pg   MCHC 35.1 30.0 - 36.0 g/dL   RDW 53.2 99.2 - 42.6 %   Platelets 192 150 - 400 K/uL   nRBC 0.0 0.0 - 0.2 %    Comment: Performed at [facility], [address], [place], [place] [address]  Comprehensive metabolic panel     Status: Abnormal   Collection Time: 11/18/19  3:09 PM  Result Value Ref Range   Sodium 134 (L) 135 - 145 mmol/L   Potassium 2.9 (L) 3.5 - 5.1 mmol/L   Chloride 104 98 - 111 mmol/L   CO2 20 (L) 22 - 32 mmol/L   Glucose, Bld 88 70 - 99 mg/dL    Comment: Glucose reference range applies only to samples taken after fasting for at least 8 hours.   BUN <5 (L) 6 - 20 mg/dL   Creatinine, Ser 6.22 0.44 - 1.00 mg/dL   Calcium 8.1 (L) 8.9 - 10.3 mg/dL   Total Protein 5.6 (L) 6.5 - 8.1 g/dL   Albumin 2.9 (L) 3.5 - 5.0 g/dL   AST 63 (H) 15 - 41 U/L   ALT 76 (H) 0 -  44 U/L   Alkaline Phosphatase 50 38 - 126 U/L   Total Bilirubin 1.2 0.3 - 1.2 mg/dL   GFR calc non Af Amer >60 >60 mL/min   GFR calc Af Amer >60 >60 mL/min   Anion gap 10 5 - 15    Comment: Performed at [facility], [address], [place], [place] [address]  Type and screen [facility]     Status: None   Collection Time: 11/18/19  3:09 PM  Result Value Ref Range   ABO/RH(D) O POS    Antibody Screen NEG    Sample Expiration      11/21/2019,2359 Performed at [facility], [address] [place], [place] [address]   Wet prep, genital     Status: Abnormal   Collection Time: 11/18/19  3:24 PM   Specimen: PATH Cytology Cervicovaginal  Ancillary Only  Result Value Ref Range   Yeast Wet Prep HPF POC NONE SEEN NONE SEEN   Trich, Wet Prep NONE SEEN NONE SEEN   Clue Cells Wet Prep HPF POC NONE SEEN NONE SEEN   WBC, Wet Prep HPF POC FEW (A) NONE SEEN   Sperm NONE SEEN     Comment: Performed at [facility], [address] [place], [place] [address]  Protein / creatinine ratio, urine     Status: None   Collection Time: 11/18/19  3:43 PM  Result Value Ref Range   Creatinine, Urine 130.07 mg/dL   Total Protein, Urine 19 mg/dL    Comment: NO NORMAL RANGE ESTABLISHED FOR THIS TEST   Protein Creatinine Ratio 0.15 0.00 - 0.15 mg/mg[Cre]    Comment: Performed at [facility], [address], [place], [place] [address]  CBC     Status: Abnormal   Collection Time: 11/19/19  9:58 AM  Result Value Ref Range   WBC 11.1 (H) 4.0 - 10.5 K/uL   RBC 3.71 (L) 3.87 - 5.11 MIL/uL   Hemoglobin 11.6 (L) 12.0 - 15.0 g/dL   HCT 41.9 (L) 37.9 - 02.4 %   MCV 89.5 80.0 - 100.0 fL   MCH 31.3 26.0 - 34.0 pg   MCHC 34.9 30.0 - 36.0 g/dL   RDW 09.7 35.3 - 29.9 %   Platelets 218 150 - 400 K/uL   nRBC 0.0 0.0 - 0.2 %    Comment: Performed at [facility], [address], [place], [place] [address]  Comprehensive metabolic panel     Status: Abnormal   Collection Time: 11/19/19  9:58 AM  Result Value Ref Range   Sodium 136 135 - 145 mmol/L   Potassium 3.7 3.5 - 5.1 mmol/L    Comment: NO VISIBLE HEMOLYSIS   Chloride 104 98 - 111 mmol/L   CO2 22 22 - 32 mmol/L   Glucose, Bld 97 70 - 99 mg/dL    Comment: Glucose reference range applies only to samples taken after fasting for at least 8 hours.   BUN <5 (L) 6 - 20 mg/dL   Creatinine, Ser 3.41 0.44 - 1.00 mg/dL   Calcium 8.5 (L) 8.9 - 10.3 mg/dL   Total Protein 6.0 (L) 6.5 - 8.1 g/dL   Albumin 3.1 (L) 3.5 - 5.0 g/dL   AST 89 (H) 15 - 41 U/L   ALT 110 (H) 0 - 44 U/L   Alkaline Phosphatase 54 38 - 126 U/L   Total Bilirubin 1.0 0.3 - 1.2 mg/dL   GFR calc non  Af Amer >60 >60  mL/min   GFR calc Af Amer >60 >60 mL/min   Anion gap 10 5 - 15    Comment: Performed at [facility] [address], [place], [place] [address]  Magnesium     Status: None   Collection Time: 11/19/19  9:58 AM  Result Value Ref Range   Magnesium 1.7 1.7 - 2.4 mg/dL    Comment: Performed at [facility] [address] [place], [place] [address]  Comprehensive metabolic panel     Status: Abnormal   Collection Time: 11/20/19  6:05 AM  Result Value Ref Range   Sodium 137 135 - 145 mmol/L   Potassium 3.6 3.5 - 5.1 mmol/L   Chloride 106 98 - 111 mmol/L   CO2 21 (L) 22 - 32 mmol/L   Glucose, Bld 121 (H) 70 - 99 mg/dL    Comment: Glucose reference range applies only to samples taken after fasting for at least 8 hours.   BUN <5 (L) 6 - 20 mg/dL   Creatinine, Ser 0.73 0.44 - 1.00 mg/dL   Calcium 8.9 8.9 - 10.3 mg/dL   Total Protein 6.4 (L) 6.5 - 8.1 g/dL   Albumin 3.3 (L) 3.5 - 5.0 g/dL   AST 118 (H) 15 - 41 U/L   ALT 158 (H) 0 - 44 U/L   Alkaline Phosphatase 60 38 - 126 U/L   Total Bilirubin 1.2 0.3 - 1.2 mg/dL   GFR calc non Af Amer >60 >60 mL/min   GFR calc Af Amer >60 >60 mL/min   Anion gap 10 5 - 15    Comment: Performed at [facility], [place] [address], [place], [place] [address]  CBC     Status: Abnormal   Collection Time: 11/20/19  6:05 AM  Result Value Ref Range   WBC 17.6 (H) 4.0 - 10.5 K/uL   RBC 3.73 (L) 3.87 - 5.11 MIL/uL   Hemoglobin 11.9 (L) 12.0 - 15.0 g/dL   HCT 33.6 (L) 36.0 - 46.0 %   MCV 90.1 80.0 - 100.0 fL   MCH 31.9 26.0 - 34.0 pg   MCHC 35.4 30.0 - 36.0 g/dL   RDW 12.1 11.5 - 15.5 %   Platelets 238 150 - 400 K/uL   nRBC 0.0 0.0 - 0.2 %    Comment: Performed at [facility], [place] [address] [place], [place] [address]    I have reviewed the patient's current medications.  ASSESSMENT: Active Problems:   Gestational hypertension, third trimester   Elevated liver enzymes   PLAN: - Given increased rise in LFTs, mild  hypertension, will transfer to L&D for induction - Start mag - Cont BTMZ, will be steroid complete 5/29 am - Reviewed risks of prematurity versus HELLP and recommendation for induction, patient and her partner verbalize understanding and are agreeable - discussed with Dr. Patty Sermons, M.D. Attending [facility] [employer] (Faculty Practice)  11/20/2019 8:34 AM

## 2019-11-20 NOTE — Progress Notes (Signed)
Labor Progress Note Leslie Yates is a 29 y.o. G1P0000 at 65w1dtransitioned to labor and delivery for IOL for HELLP syndrome.  S: Overall comfortable. Met patient and discussed plan. Agreeable for foley balloon.   O:  BP 128/83   Pulse 91   Temp 98.2 F (36.8 C) (Oral)   Resp 16   Ht 6' (1.829 m)   Wt 110.5 kg   LMP 02/28/2019 (Approximate) Comment: neg preg test 10/27/2018  SpO2 98%   BMI 33.05 kg/m  EFM: 130, moderate variability, pos accels, no decels, reactive TOCO: none  CVE: Dilation: Fingertip Effacement (%): 50 Station: -3 Presentation: Vertex Exam by:: SJeanann LewandowskyRN   A&P: 29y.o. G1P0000 317w1dere for IOL for HELLP syndrome.  #Labor: s/p cytotec x2. Will give 3rd. Foley bulb placed with speculum and filled with 60 mL water; patient tolerated well. Discussed trending of labs and patient understanding that if labs are stable she will likely be here another 24-48 hours for induction. If labs become unstable at any point, she understands c-section will be recommended.  #HELLP: Repeat labs Q 8 hours. Repeat labs at 1600. AST/ALT rising. Platelets 238>226. Fibrinogen 248>252.  #Preterm: S/P BMZ x2 #Pain: Would like to avoid epidural if possible. Open to IV medications. #FWB: Cat I #GBS Pending. Ancef Q 8 for now.   ChChauncey MannMD 8:50 PM

## 2019-11-20 NOTE — Progress Notes (Signed)
Provider Results for JAALA, BOHLE (MRN [id number]) as of 11/20/2019 07:26  Ref. Range 11/19/2019 09:58 11/20/2019 06:05  AST Latest Ref Range: 15 - 41 U/L 89 (H) 118 (H)  ALT Latest Ref Range: 0 - 44 U/L 110 (H) 158 (H)  made aware of LFT's trending upwards. No new orders given. Will continue to monitor. Carmelina Dane, RN

## 2019-11-20 NOTE — Progress Notes (Signed)
Labor Progress Note Leslie Yates is a 29 y.o. G1P0000 at [redacted]w[redacted]d transitioned to labor and delivery for IOL for HELLP syndrome.  S: Does not feel any contractions yet.  Generally comfortably.   O:  BP 118/68   Pulse 88   Temp 98 F (36.7 C)   Resp 16   Ht 6' (1.829 m)   Wt 110.5 kg   LMP 02/28/2019 (Approximate) Comment: neg preg test 10/27/2018  SpO2 98%   BMI 33.05 kg/m  EFM: 135/moderate var/pos accels, no decels  CVE: Dilation: Fingertip Effacement (%): 50 Station: -3 Presentation: Vertex Exam by:: Albertine Grates RN   A&P: 29 y.o. G1P0000 [redacted]w[redacted]d here for IOL for HELLP syndrome.  #Labor: s/p cytotec x2. Will consider FB at next cervical exam. #HELLP: Repeat labs Q 8 hours. Repeat labs at 1600. AST/ALT rising. Platelets 238. Fibrinogen 248.  Will consider C-section for worsening labs. #Preterm: S/P BMZ x2 #Pain: Would like to avoid epidural if possible. Open to IV medications. #FWB: Cat I #GBS Pending. Ancef Q 8 for now.   Mirian Mo, MD 3:33 PM

## 2019-11-21 ENCOUNTER — Inpatient Hospital Stay (HOSPITAL_COMMUNITY): Payer: Medicaid Other | Admitting: Anesthesiology

## 2019-11-21 DIAGNOSIS — O1424 HELLP syndrome, complicating childbirth: Principal | ICD-10-CM

## 2019-11-21 DIAGNOSIS — Z3A35 35 weeks gestation of pregnancy: Secondary | ICD-10-CM

## 2019-11-21 LAB — COMPREHENSIVE METABOLIC PANEL
ALT: 187 U/L — ABNORMAL HIGH (ref 0–44)
ALT: 201 U/L — ABNORMAL HIGH (ref 0–44)
ALT: 202 U/L — ABNORMAL HIGH (ref 0–44)
AST: 137 U/L — ABNORMAL HIGH (ref 15–41)
AST: 152 U/L — ABNORMAL HIGH (ref 15–41)
AST: 152 U/L — ABNORMAL HIGH (ref 15–41)
Albumin: 3.4 g/dL — ABNORMAL LOW (ref 3.5–5.0)
Albumin: 3.1 g/dL — ABNORMAL LOW (ref 3.5–5.0)
Albumin: 2.8 g/dL — ABNORMAL LOW (ref 3.5–5.0)
Alkaline Phosphatase: 59 U/L (ref 38–126)
Alkaline Phosphatase: 51 U/L (ref 38–126)
Alkaline Phosphatase: 51 U/L (ref 38–126)
Anion gap: 12 (ref 5–15)
Anion gap: 8 (ref 5–15)
Anion gap: 9 (ref 5–15)
BUN: <5 mg/dL — ABNORMAL LOW (ref 6–20)
BUN: <5 mg/dL — ABNORMAL LOW (ref 6–20)
BUN: <5 mg/dL — ABNORMAL LOW (ref 6–20)
CO2: 20 mmol/L — ABNORMAL LOW (ref 22–32)
CO2: 21 mmol/L — ABNORMAL LOW (ref 22–32)
CO2: 25 mmol/L (ref 22–32)
Calcium: 7.9 mg/dL — ABNORMAL LOW (ref 8.9–10.3)
Calcium: 7.3 mg/dL — ABNORMAL LOW (ref 8.9–10.3)
Calcium: 6.8 mg/dL — ABNORMAL LOW (ref 8.9–10.3)
Chloride: 103 mmol/L (ref 98–111)
Chloride: 106 mmol/L (ref 98–111)
Chloride: 105 mmol/L (ref 98–111)
Creatinine, Ser: 0.65 mg/dL (ref 0.44–1.00)
Creatinine, Ser: 0.61 mg/dL (ref 0.44–1.00)
Creatinine, Ser: 0.65 mg/dL (ref 0.44–1.00)
GFR calc Af Amer: >60 mL/min (ref >60)
GFR calc Af Amer: >60 mL/min (ref >60)
GFR calc Af Amer: >60 mL/min (ref >60)
GFR calc non Af Amer: >60 mL/min (ref >60)
GFR calc non Af Amer: >60 mL/min (ref >60)
GFR calc non Af Amer: >60 mL/min (ref >60)
Glucose, Bld: 141 mg/dL — ABNORMAL HIGH (ref 70–99)
Glucose, Bld: 109 mg/dL — ABNORMAL HIGH (ref 70–99)
Glucose, Bld: 103 mg/dL — ABNORMAL HIGH (ref 70–99)
Potassium: 3.7 mmol/L (ref 3.5–5.1)
Potassium: 3.4 mmol/L — ABNORMAL LOW (ref 3.5–5.1)
Potassium: 3.1 mmol/L — ABNORMAL LOW (ref 3.5–5.1)
Sodium: 135 mmol/L (ref 135–145)
Sodium: 135 mmol/L (ref 135–145)
Sodium: 139 mmol/L (ref 135–145)
Total Bilirubin: 0.6 mg/dL (ref 0.3–1.2)
Total Bilirubin: 0.8 mg/dL (ref 0.3–1.2)
Total Bilirubin: 0.6 mg/dL (ref 0.3–1.2)
Total Protein: 6.4 g/dL — ABNORMAL LOW (ref 6.5–8.1)
Total Protein: 5.9 g/dL — ABNORMAL LOW (ref 6.5–8.1)
Total Protein: 5.5 g/dL — ABNORMAL LOW (ref 6.5–8.1)

## 2019-11-21 LAB — CBC
HCT: 30.2 % — ABNORMAL LOW (ref 36.0–46.0)
HCT: 31.1 % — ABNORMAL LOW (ref 36.0–46.0)
HCT: 34.1 % — ABNORMAL LOW (ref 36.0–46.0)
Hemoglobin: 10.5 g/dL — ABNORMAL LOW (ref 12.0–15.0)
Hemoglobin: 11 g/dL — ABNORMAL LOW (ref 12.0–15.0)
Hemoglobin: 12 g/dL (ref 12.0–15.0)
MCH: 31.6 pg (ref 26.0–34.0)
MCH: 31.7 pg (ref 26.0–34.0)
MCH: 31.9 pg (ref 26.0–34.0)
MCHC: 34.8 g/dL (ref 30.0–36.0)
MCHC: 35.2 g/dL (ref 30.0–36.0)
MCHC: 35.4 g/dL (ref 30.0–36.0)
MCV: 89.4 fL (ref 80.0–100.0)
MCV: 90.7 fL (ref 80.0–100.0)
MCV: 91.2 fL (ref 80.0–100.0)
Platelets: 196 10*3/uL (ref 150–400)
Platelets: 220 10*3/uL (ref 150–400)
Platelets: 233 10*3/uL (ref 150–400)
RBC: 3.31 MIL/uL — ABNORMAL LOW (ref 3.87–5.11)
RBC: 3.48 MIL/uL — ABNORMAL LOW (ref 3.87–5.11)
RBC: 3.76 MIL/uL — ABNORMAL LOW (ref 3.87–5.11)
RDW: 12.3 % (ref 11.5–15.5)
RDW: 12.3 % (ref 11.5–15.5)
RDW: 12.4 % (ref 11.5–15.5)
WBC: 14.9 10*3/uL — ABNORMAL HIGH (ref 4.0–10.5)
WBC: 16.9 10*3/uL — ABNORMAL HIGH (ref 4.0–10.5)
WBC: 17.5 10*3/uL — ABNORMAL HIGH (ref 4.0–10.5)
nRBC: 0 % (ref 0.0–0.2)
nRBC: 0 % (ref 0.0–0.2)
nRBC: 0 % (ref 0.0–0.2)

## 2019-11-21 LAB — FIBRINOGEN
Fibrinogen: 268 mg/dL (ref 210–475)
Fibrinogen: 269 mg/dL (ref 210–475)
Fibrinogen: 284 mg/dL (ref 210–475)

## 2019-11-21 LAB — TYPE AND SCREEN
ABO/RH(D): O POS
Antibody Screen: NEGATIVE

## 2019-11-21 LAB — MAGNESIUM
Magnesium: 4.5 mg/dL — ABNORMAL HIGH (ref 1.7–2.4)
Magnesium: 4.9 mg/dL — ABNORMAL HIGH (ref 1.7–2.4)

## 2019-11-21 LAB — PROTIME-INR
INR: 1.1 (ref 0.8–1.2)
Prothrombin Time: 13.9 seconds (ref 11.4–15.2)

## 2019-11-21 LAB — APTT: aPTT: 27 seconds (ref 24–36)

## 2019-11-21 MED ORDER — PHENYLEPHRINE 40 MCG/ML (10ML) SYRINGE FOR IV PUSH (FOR BLOOD PRESSURE SUPPORT)
80.0000 ug | PREFILLED_SYRINGE | INTRAVENOUS | Status: DC | PRN
  Filled 2019-11-21: qty 10

## 2019-11-21 MED ORDER — BUTORPHANOL TARTRATE 1 MG/ML IJ SOLN: 2.0000 mg | Freq: Once | INTRAMUSCULAR | Status: DC

## 2019-11-21 MED ORDER — LACTATED RINGERS IV SOLN
500.0000 mL | Freq: Once | INTRAVENOUS | Status: AC
  Administered 2019-11-21: 500 mL via INTRAVENOUS

## 2019-11-21 MED ORDER — MAGNESIUM SULFATE BOLUS VIA INFUSION
4.0000 g | Freq: Once | INTRAVENOUS | Status: AC
  Administered 2019-11-21: 4 g via INTRAVENOUS
  Filled 2019-11-21: qty 1000

## 2019-11-21 MED ORDER — EPHEDRINE 5 MG/ML INJ: 10.0000 mg | INTRAVENOUS | Status: DC | PRN

## 2019-11-21 MED ORDER — OXYTOCIN 40 UNITS IN NORMAL SALINE INFUSION - SIMPLE MED
1.0000 m[IU]/min | INTRAVENOUS | Status: DC
  Administered 2019-11-21: 2 m[IU]/min via INTRAVENOUS
  Administered 2019-11-21: 4 m[IU]/min via INTRAVENOUS
  Filled 2019-11-21: qty 1000

## 2019-11-21 MED ORDER — LIDOCAINE HCL (PF) 1 % IJ SOLN
INTRAMUSCULAR | Status: DC | PRN
  Administered 2019-11-21: 11 mL via EPIDURAL

## 2019-11-21 MED ORDER — DIPHENHYDRAMINE HCL 50 MG/ML IJ SOLN
12.5000 mg | INTRAMUSCULAR | Status: DC | PRN
  Administered 2019-11-21: 12.5 mg via INTRAVENOUS
  Filled 2019-11-21: qty 1

## 2019-11-21 MED ORDER — POTASSIUM CHLORIDE CRYS ER 20 MEQ PO TBCR
20.0000 meq | EXTENDED_RELEASE_TABLET | ORAL | Status: AC
  Administered 2019-11-21: 20 meq via ORAL
  Filled 2019-11-21 (×2): qty 1

## 2019-11-21 MED ORDER — PROMETHAZINE HCL 25 MG/ML IJ SOLN: 12.5000 mg | Freq: Once | INTRAMUSCULAR | Status: DC

## 2019-11-21 MED ORDER — TRANEXAMIC ACID-NACL 1000-0.7 MG/100ML-% IV SOLN: 1000.0000 mg | INTRAVENOUS | Status: DC

## 2019-11-21 MED ORDER — TERBUTALINE SULFATE 1 MG/ML IJ SOLN: 0.2500 mg | Freq: Once | INTRAMUSCULAR | Status: DC | PRN

## 2019-11-21 MED ORDER — SODIUM CHLORIDE (PF) 0.9 % IJ SOLN
INTRAMUSCULAR | Status: DC | PRN
  Administered 2019-11-21: 12 mL/h via EPIDURAL

## 2019-11-21 MED ORDER — FENTANYL-BUPIVACAINE-NACL 0.5-0.125-0.9 MG/250ML-% EP SOLN
12.0000 mL/h | EPIDURAL | Status: DC | PRN
  Filled 2019-11-21: qty 250

## 2019-11-21 MED ORDER — PHENYLEPHRINE 40 MCG/ML (10ML) SYRINGE FOR IV PUSH (FOR BLOOD PRESSURE SUPPORT): 80.0000 ug | PREFILLED_SYRINGE | INTRAVENOUS | Status: DC | PRN

## 2019-11-21 MED ORDER — POTASSIUM CHLORIDE 10 MEQ/100ML IV SOLN
10.0000 meq | INTRAVENOUS | Status: DC
  Filled 2019-11-21 (×4): qty 100

## 2019-11-21 NOTE — Progress Notes (Signed)
Labor Progress Note Leslie Yates is a 29 y.o. G1P0000 at [redacted]w[redacted]d transitioned to labor and delivery for IOL for HELLP syndrome.  S: Sleeping. Not feeling ctx.    O:  BP 112/61   Pulse 91   Temp 98.2 F (36.8 C) (Oral)   Resp 14   Ht 6' (1.829 m)   Wt 110.5 kg   LMP 02/28/2019 (Approximate) Comment: neg preg test 10/27/2018  SpO2 97%   BMI 33.05 kg/m  EFM: 120, moderate variability, no accels, no decels TOCO: none tracing   CVE: Dilation: 2 Effacement (%): 50 Cervical Position: Posterior Station: -3 Presentation: Vertex Exam by:: [place]   A&P: 29 y.o. G1P0000 [redacted]w[redacted]d here for IOL for HELLP syndrome.  #Labor: s/p cytotec x3, will give 4th. FB out although pulled out too soon by partner and therefore not full benefit. Cervix unchanged and patient not feeling ctx with Cytotec. Will start Pitocin. Anticipate VD; CS as appropriate.  #HELLP: Repeat labs Q 8 hours. AST and ALT trending up but otherwise Fibrinogen, Platelets and Cr have been stable. Cont Mag.  #Preterm: S/P BMZ x2 #Pain: per patient request  #FWB: Cat I #GBS Pending. Ancef Q 8 for now.   [name], MD 5:18 AM

## 2019-11-21 NOTE — Progress Notes (Signed)
Labor Progress Note SRAH AKE is a 29 y.o. G1P0000 at [redacted]w[redacted]d transitioned to labor and delivery for IOL for HELLP syndrome.  S: Awake and comfortable for now. Significant discomfort overnight with the FB which was ultimately removed prematurely.     O:  BP (!) 110/55   Pulse 78   Temp 98.9 F (37.2 C) (Oral)   Resp 18   Ht 6' (1.829 m)   Wt 110.5 kg   LMP 02/28/2019 (Approximate) Comment: neg preg test 10/27/2018  SpO2 94%   BMI 33.05 kg/m  EFM: 130/ minimal-moderate var/pos accels  CVE: Dilation: 2 Effacement (%): 50 Cervical Position: Posterior Station: -3 Presentation: Vertex Exam by:: Fair   A&P: 29 y.o. G1P0000 [redacted]w[redacted]d here for IOL for HELLP syndrome.  #Labor: s/p cytotec x4. FB placed and pulled out prematurely overnight.  Continue Pit. Plan to place epidural and placed FB again if appropriate. #HELLP: Repeat labs Q 8 hours. AST and ALT trending up but otherwise Fibrinogen, Platelets and Cr have been stable. Cont Mag.  #Preterm: S/P BMZ x2 #Pain: per patient request  #FWB: Cat I #GBS Pending. Ancef Q 8 for now.   Mirian Mo, MD 9:35 AM

## 2019-11-21 NOTE — Anesthesia Procedure Notes (Signed)
Epidural Patient location during procedure: OB Start time: 11/21/2019 9:33 AM End time: 11/21/2019 9:45 AM  Staffing Anesthesiologist: Lowella Curb, MD Performed: anesthesiologist   Preanesthetic Checklist Completed: patient identified, IV checked, site marked, risks and benefits discussed, surgical consent, monitors and equipment checked, pre-op evaluation and timeout performed  Epidural Patient position: sitting Prep: ChloraPrep Patient monitoring: heart rate, cardiac monitor, continuous pulse ox and blood pressure Approach: midline Location: L2-L3 Injection technique: LOR saline  Needle:  Needle type: Tuohy  Needle gauge: 17 G Needle length: 9 cm Needle insertion depth: 6 cm Catheter type: closed end flexible Catheter size: 20 Guage Catheter at skin depth: 10 cm Test dose: negative  Assessment Events: blood not aspirated, injection not painful, no injection resistance, no paresthesia and negative IV test  Additional Notes Reason for block:procedure for pain

## 2019-11-21 NOTE — Progress Notes (Signed)
Patient ID: Leslie Yates, female   DOB: [birth year], 29 y.o.   MRN: [id number] Pt doing well, just feels tired. Denies ha, visual changes, ruq/epigastric pain, n/v.  Waiting on labs to return to get epidural, then plan to reinsert foley bulb (pt/partner had pulled out earlier in night b/c pt was so uncomfortable w/ it), so will wait until she is comfortable w/ epidural for exam/FB placement.  VSS Cat 1FHR Irregular uc's Cheral Marker, CNM, [facility] 11/21/2019 8:47 AM

## 2019-11-21 NOTE — Discharge Summary (Signed)
Postpartum Discharge Summary  Date of Service updated 6/7     Patient Name: Leslie Yates DOB: 1991-05-08 MRN: [id number]  Date of admission: 11/18/2019 Delivery date:11/21/2019 Delivering provider: Seabron Spates  Date of discharge: 11/23/2019  Admitting diagnosis: Gestational hypertension, third trimester [O13.3] Elevated liver enzymes [R74.8] Intrauterine pregnancy: [redacted]w[redacted]d    Secondary diagnosis:  Active Problems:   Gestational hypertension, third trimester   Elevated liver enzymes  Additional problems: HELLP syndrome, based on elevated LFT's    Discharge diagnosis: Term Pregnancy Delivered, Gestational Hypertension and superimposed HELLP                                               Post partum procedures:none Augmentation: AROM, Pitocin, Cytotec and IP Foley Complications: None  Hospital course: Induction of Labor With Vaginal Delivery   29y.o. yo G1P0000 at 371w2das admitted to the hospital 11/18/2019 for induction of labor.  Indication for induction: Gestational hypertension and superimposed HELLP .  Patient had an uncomplicated labor course as follows: Cervical ripening with Cytotec and Foley Then augmented with AROM and Pitocin Was stalled at 4.5cm for several hours, did make slight change in effacement, reaching 4.5 cm 90% -1station, then precipitously rapidly progressed to complete dilation and accomplished SVD in 3 pushes. Membrane Rupture Time/Date: 1:45 PM ,11/21/2019   Delivery Method:Vaginal, Spontaneous  Episiotomy:  none Lacerations:  2nd degree;Perineal , superficial with bilateral labial superficial lacerations.  Details of delivery can be found in separate delivery note.  Patient had a routine postpartum course. Patient is discharged home on 5/31 CBC Latest Ref Rng & Units 11/23/2019 11/22/2019 11/21/2019  WBC 4.0 - 10.5 K/uL 13.3(H) 16.0(H) 14.9(H)  Hemoglobin 12.0 - 15.0 g/dL 9.8(L) 9.7(L) 10.5(L)  Hematocrit 36.0 - 46.0 % 28.3(L) 28.1(L) 30.2(L)   Platelets 150 - 400 K/uL 215 192 196   CMP Latest Ref Rng & Units 11/23/2019 11/22/2019 11/21/2019  Glucose 70 - 99 mg/dL 82 93 103(H)  BUN 6 - 20 mg/dL 7 <5(L) <5(L)  Creatinine 0.44 - 1.00 mg/dL 0.68 0.62 0.65  Sodium 135 - 145 mmol/L 137 135 139  Potassium 3.5 - 5.1 mmol/L 3.8 3.4(L) 3.1(L)  Chloride 98 - 111 mmol/L 105 106 105  CO2 22 - 32 mmol/L 23 24 25   Calcium 8.9 - 10.3 mg/dL 7.9(L) 6.9(L) 6.8(L)  Total Protein 6.5 - 8.1 g/dL 5.2(L) 5.0(L) 5.5(L)  Total Bilirubin 0.3 - 1.2 mg/dL 0.5 0.8 0.6  Alkaline Phos 38 - 126 U/L 52 47 51  AST 15 - 41 U/L 50(H) 120(H) 152(H)  ALT 0 - 44 U/L 127(H) 183(H) 202(H)    Newborn Data: Birth date:11/21/2019  Birth time:9:38 PM  Gender:Female  Living status:Living  Apgars:5 ,8  Weight:2296 g   Magnesium Sulfate received: Yes: Seizure prophylaxis BMZ received: Yes Rhophylac:No MMR:No T-DaP:Given prenatally Flu: No Transfusion:No  Physical exam  Vitals:   11/22/19 2055 11/22/19 2200 11/22/19 2340 11/23/19 0432  BP:   128/63 118/66  Pulse:   72 68  Resp: 19 19 19 18   Temp:   97.7 F (36.5 C) 98 F (36.7 C)  TempSrc:   Oral Oral  SpO2:   98% 100%  Weight:      Height:       General: alert, cooperative and no distress Lochia: appropriate Uterine Fundus: firm Incision: N/A DVT Evaluation: No  evidence of DVT seen on physical exam. Negative Homan's sign. No cords or calf tenderness. Labs: Lab Results  Component Value Date   WBC 16.0 (H) 11/22/2019   HGB 9.7 (L) 11/22/2019   HCT 28.1 (L) 11/22/2019   MCV 91.2 11/22/2019   PLT 192 11/22/2019   CMP Latest Ref Rng & Units 11/22/2019  Glucose 70 - 99 mg/dL 93  BUN 6 - 20 mg/dL <5(L)  Creatinine 0.44 - 1.00 mg/dL 0.62  Sodium 135 - 145 mmol/L 135  Potassium 3.5 - 5.1 mmol/L 3.4(L)  Chloride 98 - 111 mmol/L 106  CO2 22 - 32 mmol/L 24  Calcium 8.9 - 10.3 mg/dL 6.9(L)  Total Protein 6.5 - 8.1 g/dL 5.0(L)  Total Bilirubin 0.3 - 1.2 mg/dL 0.8  Alkaline Phos 38 - 126 U/L 47   AST 15 - 41 U/L 120(H)  ALT 0 - 44 U/L 183(H)   Flavia Shipper Score: Edinburgh Postnatal Depression Scale Screening Tool 11/22/2019  I have been able to laugh and see the funny side of things. 1  I have looked forward with enjoyment to things. 0  I have blamed myself unnecessarily when things went wrong. 2  I have been anxious or worried for no good reason. 0  I have felt scared or panicky for no good reason. 0  Things have been getting on top of me. 1  I have been so unhappy that I have had difficulty sleeping. 0  I have felt sad or miserable. 0  I have been so unhappy that I have been crying. 0  The thought of harming myself has occurred to me. 0  Edinburgh Postnatal Depression Scale Total 4      After visit meds:  Allergies as of 11/23/2019       Reactions   Penicillins Rash   Childhood rash Has patient had a PCN reaction causing immediate rash, facial/tongue/throat swelling, SOB or lightheadedness with hypotension: no Has patient had a PCN reaction causing severe rash involving mucus membranes or skin necrosis:no Has patient had a PCN reaction that required hospitalization no Has patient had a PCN reaction occurring within the last 10 years: no If all of the above answers are "NO", then may proceed with Cephalosporin use.        Medication List     STOP taking these medications    aspirin 81 MG chewable tablet   potassium chloride SA 20 MEQ tablet Commonly known as: KLOR-CON       TAKE these medications    acetaminophen 325 MG tablet Commonly known as: TYLENOL Take 650 mg by mouth every 6 (six) hours as needed for mild pain or headache.   benzocaine-Menthol 20-0.5 % Aero Commonly known as: DERMOPLAST Apply 1 application topically as needed for irritation (perineal discomfort).   Blood Pressure Monitor Automat Devi 1 Device by Does not apply route daily. Automatic blood pressure cuff regular/x-large size cuff. To monitor blood pressure regularly at home.  ICD-10 code: O09.90.   Gojji Weight Scale Misc 1 each by Does not apply route daily as needed. Patient to weight daily as needed. ICD-10 code: O09.90   hydrocortisone-pramoxine rectal foam Commonly known as: PROCTOFOAM-HC Place 1 applicator rectally 2 (two) times daily. What changed:   when to take this  reasons to take this   ibuprofen 600 MG tablet Commonly known as: ADVIL Take 1 tablet (600 mg total) by mouth every 6 (six) hours as needed.   ondansetron 4 MG disintegrating tablet Commonly known as: Zofran ODT  Take 1-2 tablets (4-8 mg total) by mouth every 6 (six) hours as needed for nausea or vomiting. For nausea and vomiting not relieved by Phenergan   Prenatal 19 29-1 MG Tabs Take 1 tablet by mouth daily.   promethazine 25 MG tablet Commonly known as: PHENERGAN Take 1 tablet (25 mg total) by mouth every 6 (six) hours as needed for nausea or vomiting.   scopolamine 1 MG/3DAYS Commonly known as: Transderm-Scop (1.5 MG) Place 1 patch (1.5 mg total) onto the skin every 3 (three) days.   terconazole 0.4 % vaginal cream Commonly known as: Terazol 7 Place 1 applicator vaginally at bedtime.        Please schedule this patient for Postpartum visit in: 1 week with the following provider: Any provider In-Person For C/S patients schedule nurse incision check in weeks 2 weeks: no High risk pregnancy complicated by: severe preeclampsia/HELLP Delivery mode:  SVD Anticipated Birth Control:  other/female partner PP Procedures needed: BP check  Schedule Integrated [place] visit: no  Discharge home in stable condition Infant Feeding: Breast Infant Disposition:home with mother Discharge instruction: per After Visit Summary and Postpartum booklet. Activity: Advance as tolerated. Pelvic rest for 6 weeks.  Diet: routine diet Anticipated Birth Control:  Declines Postpartum Appointment: 4 weeks, BP check in 1 week, consider recheck of LFT's.  Future Appointments  Date Time  Provider [place]  11/26/2019 10:50 AM Laury Deep, CNM [facility] None  12/01/2019  9:30 AM [facility] None   Follow up Visit: Follow-up Information     [place]. Go in 1 week(s).   Specialty: Obstetrics and Gynecology Why: blood pressure and lab visit call on thursday if you haven't heard about your appointment for next week Contact information: [place]           Jonnie Kind, MD     11/23/2019 Aletha Halim, MD

## 2019-11-21 NOTE — Anesthesia Preprocedure Evaluation (Signed)
Anesthesia Evaluation  Patient identified by MRN, date of birth, ID band Patient awake    Reviewed: Allergy & Precautions, NPO status , Patient's Chart, lab work & pertinent test results  Airway Mallampati: II  TM Distance: >3 FB Neck ROM: Full    Dental no notable dental hx.    Pulmonary asthma , Current Smoker and Patient abstained from smoking.,    Pulmonary exam normal breath sounds clear to auscultation       Cardiovascular hypertension, Normal cardiovascular exam Rhythm:Regular Rate:Normal     Neuro/Psych Seizures -,  negative psych ROS   GI/Hepatic negative GI ROS, Neg liver ROS,   Endo/Other  negative endocrine ROS  Renal/GU negative Renal ROS  negative genitourinary   Musculoskeletal negative musculoskeletal ROS (+)   Abdominal   Peds negative pediatric ROS (+)  Hematology negative hematology ROS (+)   Anesthesia Other Findings   Reproductive/Obstetrics (+) Pregnancy                             Anesthesia Physical Anesthesia Plan  ASA: II  Anesthesia Plan: Epidural   Post-op Pain Management:    Induction:   PONV Risk Score and Plan:   Airway Management Planned:   Additional Equipment:   Intra-op Plan:   Post-operative Plan:   Informed Consent:   Plan Discussed with:   Anesthesia Plan Comments:         Anesthesia Quick Evaluation

## 2019-11-21 NOTE — Progress Notes (Signed)
Leslie Yates is a 29 y.o. G1P0000 at [redacted]w[redacted]d by  admitted for HELLP syndrome  Subjective: Pt comfortable with epidural  Objective: BP 101/61   Pulse 88   Temp 97.7 F (36.5 C) (Oral)   Resp 18   Ht 6' (1.829 m)   Wt 110.5 kg   LMP 02/28/2019 (Approximate) Comment: neg preg test 10/27/2018  SpO2 99%   BMI 33.05 kg/m  I/O last 3 completed shifts: In: 8548.8 [P.O.:3458; I.V.:4820.6; IV Piggyback:270.2] Out: 3850 [Urine:3650; Emesis/NG output:200] Total I/O In: 1938.1 [P.O.:720; I.V.:1118.1; IV Piggyback:100] Out: 900 [Urine:900]  FHT:  FHR: 130 bpm, variability: moderate,  accelerations:  Present,  decelerations:  Absent UC:   regular, every 3 minutes SVE:   Dilation: 4 Effacement (%): 60, 70 Station: -1 Exam by:: SRussell, RN    arom by Dirk Dress clear fluid at 13:45 Labs: Lab Results  Component Value Date   WBC 17.5 (H) 11/21/2019   HGB 11.0 (L) 11/21/2019   HCT 31.1 (L) 11/21/2019   MCV 89.4 11/21/2019   PLT 220 11/21/2019   CBC Latest Ref Rng & Units 11/21/2019 11/20/2019 11/20/2019  WBC 4.0 - 10.5 K/uL 17.5(H) 16.9(H) 14.1(H)  Hemoglobin 12.0 - 15.0 g/dL 11.0(L) 12.0 11.2(L)  Hematocrit 36.0 - 46.0 % 31.1(L) 34.1(L) 32.0(L)  Platelets 150 - 400 K/uL 220 233 226   CMP Latest Ref Rng & Units 11/21/2019 11/20/2019 11/20/2019  Glucose 70 - 99 mg/dL 503(T) 465(K) 812(X)  BUN 6 - 20 mg/dL <5(T) <7(G) <0(F)  Creatinine 0.44 - 1.00 mg/dL 7.49 4.49 6.75  Sodium 135 - 145 mmol/L 135 135 136  Potassium 3.5 - 5.1 mmol/L 3.4(L) 3.7 4.4  Chloride 98 - 111 mmol/L 106 103 107  CO2 22 - 32 mmol/L 21(L) 20(L) 20(L)  Calcium 8.9 - 10.3 mg/dL 7.3(L) 7.9(L) 8.0(L)  Total Protein 6.5 - 8.1 g/dL 5.9(L) 6.4(L) 6.3(L)  Total Bilirubin 0.3 - 1.2 mg/dL 0.8 0.6 0.8  Alkaline Phos 38 - 126 U/L 51 59 54  AST 15 - 41 U/L 152(H) 137(H) 108(H)  ALT 0 - 44 U/L 201(H) 187(H) 163(H)     Assessment / Plan: Induction of labor due to preeclampsia and HELLP,  progressing well on pitocin  Labor:  Progressing on Pitocin, will continue to increase then AROM and arom done Preeclampsia:  on magnesium sulfate Fetal Wellbeing:  Category I Pain Control:  Epidural I/D:  n/a Anticipated MOD:  NSVD  Tilda Burrow 11/21/2019, 1:52 PM

## 2019-11-21 NOTE — Progress Notes (Signed)
Labor Progress Note Leslie Yates is a 29 y.o. G1P0000 at [redacted]w[redacted]d transitioned to labor and delivery for IOL for HELLP syndrome.  S: Patient crying and screaming and in pain. Foley balloon yanked out by partner. Able to calm patient down and now back in bed after using restroom and agreeable with plan.    O:  BP 128/74   Pulse 87   Temp 98.2 F (36.8 C) (Oral)   Resp 16   Ht 6' (1.829 m)   Wt 110.5 kg   LMP 02/28/2019 (Approximate) Comment: neg preg test 10/27/2018  SpO2 97%   BMI 33.05 kg/m  EFM: 120, minimal to moderate variability, no accels, no decels TOCO: none  CVE: Dilation: 2 Effacement (%): 50 Cervical Position: Posterior Station: -3 Presentation: Vertex Exam by:: [name]   A&P: 29 y.o. G1P0000 [redacted]w[redacted]d here for IOL for HELLP syndrome.  #Labor: s/p cytotec x3, will give 4th. FB out although pulled out too soon by partner and therefore not full benefit. Consider Pitocin with next exam. Anticipate VD; CS as appropriate.  #HELLP: Repeat labs Q 8 hours. CMP pending otherwise labs stable/improved.  #Preterm: S/P BMZ x2 #Pain: per patient request  #FWB: Cat II for occasional minimal variability  #GBS Pending. Ancef Q 8 for now.   [name], MD 1:14 AM

## 2019-11-21 NOTE — Anesthesia Postprocedure Evaluation (Signed)
Anesthesia Post Note  Patient: CHARLAINE UTSEY  Procedure(s) Performed: AN AD HOC LABOR EPIDURAL     Patient location during evaluation: Mother Baby Anesthesia Type: Epidural Level of consciousness: awake and alert Pain management: pain level controlled Vital Signs Assessment: post-procedure vital signs reviewed and stable Respiratory status: spontaneous breathing, nonlabored ventilation and respiratory function stable Cardiovascular status: stable Postop Assessment: no headache, no backache and epidural receding Anesthetic complications: no    Last Vitals:  Vitals:   11/21/19 2231 11/21/19 2246  BP: 129/67 123/62  Pulse: 72 72  Resp: 14 14  Temp:    SpO2:      Last Pain:  Vitals:   11/21/19 2216  TempSrc:   PainSc: 0-No pain   Pain Goal: Patients Stated Pain Goal: 0 (11/21/19 1930)              Epidural/Spinal Function Cutaneous sensation: Able to Wiggle Toes (11/21/19 2216), Patient able to flex knees: Yes (11/21/19 2216), Patient able to lift hips off bed: Yes (11/21/19 2216), Back pain beyond tenderness at insertion site: No (11/21/19 2216), Progressively worsening motor and/or sensory loss: No (11/21/19 2216), Bowel and/or bladder incontinence post epidural: No (11/21/19 2216)  ODDONO,ERNEST

## 2019-11-21 NOTE — Progress Notes (Signed)
Patient ID: SATARA VIRELLA, female   DOB: [birth year], 29 y.o.   MRN: [id number] KAMY POINSETT is a 29 y.o. G1P0000 at [redacted]w[redacted]d admitted for induction of labor due to HELLP.  Subjective: Comfortable w/ epidural, no complaints. Denies ha, visual changes, ruq/epigastric pain, n/v.    Objective: BP (!) 86/39   Pulse 70   Temp 98 F (36.7 C) (Oral)   Resp 18   Ht 6' (1.829 m)   Wt 110.5 kg   LMP 02/28/2019 (Approximate) Comment: neg preg test 10/27/2018  SpO2 97%   BMI 33.05 kg/m  Total I/O In: 2609 [P.O.:1080; I.V.:1429; IV Piggyback:100] Out: 1300 [Urine:1300]  FHT:  FHR: 125 bpm, variability: min-mod,  accelerations:  Present,  decelerations:  Absent UC:   q 2-3 when was tracing well, currently not tracing well, IUPC placed  SVE:  4.5/60/vtx @ -2 w/ caput at -1  Pitocin @ 18 mu/min  Labs: Lab Results  Component Value Date   WBC 17.5 (H) 11/21/2019   HGB 11.0 (L) 11/21/2019   HCT 31.1 (L) 11/21/2019   MCV 89.4 11/21/2019   PLT 220 11/21/2019    Assessment / Plan: IOL d/t HELLP, bp's stable, aysmptomatic, awaiting 4pm blood draw to recheck labs. No change from last SVE when AROM'd @ 1345, pit @ 71mu/min and uc's not tracing well, IUPC inserted w/o difficulty. RN to adjust pit as needed based on MVUs. Plan repeat SVE in 2hrs  Labor: early Fetal Wellbeing:  Category II Pain Control:  Epidural Pre-eclampsia: HELLP I/D:  Ancef for GBS pending w/ PCN allergy Anticipated MOD:  NSVD  Cheral Marker CNM, WHNP-BC 11/21/2019, 4:27 PM

## 2019-11-22 ENCOUNTER — Encounter (HOSPITAL_COMMUNITY): Payer: Self-pay | Admitting: Obstetrics and Gynecology

## 2019-11-22 LAB — COMPREHENSIVE METABOLIC PANEL
ALT: 183 U/L — ABNORMAL HIGH (ref 0–44)
AST: 120 U/L — ABNORMAL HIGH (ref 15–41)
Albumin: 2.4 g/dL — ABNORMAL LOW (ref 3.5–5.0)
Alkaline Phosphatase: 47 U/L (ref 38–126)
Anion gap: 5 (ref 5–15)
BUN: <5 mg/dL — ABNORMAL LOW (ref 6–20)
CO2: 24 mmol/L (ref 22–32)
Calcium: 6.9 mg/dL — ABNORMAL LOW (ref 8.9–10.3)
Chloride: 106 mmol/L (ref 98–111)
Creatinine, Ser: 0.62 mg/dL (ref 0.44–1.00)
GFR calc Af Amer: >60 mL/min (ref >60)
GFR calc non Af Amer: >60 mL/min (ref >60)
Glucose, Bld: 93 mg/dL (ref 70–99)
Potassium: 3.4 mmol/L — ABNORMAL LOW (ref 3.5–5.1)
Sodium: 135 mmol/L (ref 135–145)
Total Bilirubin: 0.8 mg/dL (ref 0.3–1.2)
Total Protein: 5 g/dL — ABNORMAL LOW (ref 6.5–8.1)

## 2019-11-22 LAB — CULTURE, BETA STREP (GROUP B ONLY)

## 2019-11-22 LAB — CBC
HCT: 28.1 % — ABNORMAL LOW (ref 36.0–46.0)
Hemoglobin: 9.7 g/dL — ABNORMAL LOW (ref 12.0–15.0)
MCH: 31.5 pg (ref 26.0–34.0)
MCHC: 34.5 g/dL (ref 30.0–36.0)
MCV: 91.2 fL (ref 80.0–100.0)
Platelets: 192 10*3/uL (ref 150–400)
RBC: 3.08 MIL/uL — ABNORMAL LOW (ref 3.87–5.11)
RDW: 12.4 % (ref 11.5–15.5)
WBC: 16 10*3/uL — ABNORMAL HIGH (ref 4.0–10.5)
nRBC: 0 % (ref 0.0–0.2)

## 2019-11-22 LAB — FIBRINOGEN: Fibrinogen: 284 mg/dL (ref 210–475)

## 2019-11-22 MED ORDER — BENZOCAINE-MENTHOL 20-0.5 % EX AERO
1.0000 "application " | INHALATION_SPRAY | CUTANEOUS | Status: DC | PRN
  Administered 2019-11-22: 1 via TOPICAL
  Filled 2019-11-22 (×2): qty 56

## 2019-11-22 MED ORDER — LACTATED RINGERS IV SOLN: INTRAVENOUS | Status: DC

## 2019-11-22 MED ORDER — ACETAMINOPHEN 325 MG PO TABS
650.0000 mg | ORAL_TABLET | ORAL | Status: DC | PRN
  Administered 2019-11-22 (×3): 650 mg via ORAL
  Filled 2019-11-22 (×3): qty 2

## 2019-11-22 MED ORDER — IBUPROFEN 600 MG PO TABS
600.0000 mg | ORAL_TABLET | Freq: Four times a day (QID) | ORAL | Status: DC
  Administered 2019-11-22 – 2019-11-23 (×5): 600 mg via ORAL
  Filled 2019-11-22 (×5): qty 1

## 2019-11-22 MED ORDER — DIBUCAINE (PERIANAL) 1 % EX OINT: 1.0000 "application " | TOPICAL_OINTMENT | CUTANEOUS | Status: DC | PRN

## 2019-11-22 MED ORDER — WITCH HAZEL-GLYCERIN EX PADS: 1.0000 "application " | MEDICATED_PAD | CUTANEOUS | Status: DC | PRN

## 2019-11-22 MED ORDER — ONDANSETRON HCL 4 MG/2ML IJ SOLN: 4.0000 mg | INTRAMUSCULAR | Status: DC | PRN

## 2019-11-22 MED ORDER — ZOLPIDEM TARTRATE 5 MG PO TABS: 5.0000 mg | ORAL_TABLET | Freq: Every evening | ORAL | Status: DC | PRN

## 2019-11-22 MED ORDER — DIPHENHYDRAMINE HCL 25 MG PO CAPS: 25.0000 mg | ORAL_CAPSULE | Freq: Four times a day (QID) | ORAL | Status: DC | PRN

## 2019-11-22 MED ORDER — COCONUT OIL OIL
1.0000 "application " | TOPICAL_OIL | Status: DC | PRN
  Administered 2019-11-22: 1 via TOPICAL

## 2019-11-22 MED ORDER — SENNOSIDES-DOCUSATE SODIUM 8.6-50 MG PO TABS
2.0000 | ORAL_TABLET | ORAL | Status: DC
  Administered 2019-11-22 (×2): 2 via ORAL
  Filled 2019-11-22 (×2): qty 2

## 2019-11-22 MED ORDER — TETANUS-DIPHTH-ACELL PERTUSSIS 5-2.5-18.5 LF-MCG/0.5 IM SUSP: 0.5000 mL | Freq: Once | INTRAMUSCULAR | Status: DC

## 2019-11-22 MED ORDER — PRENATAL MULTIVITAMIN CH
1.0000 | ORAL_TABLET | Freq: Every day | ORAL | Status: DC
  Administered 2019-11-22: 1 via ORAL
  Filled 2019-11-22: qty 1

## 2019-11-22 MED ORDER — ONDANSETRON HCL 4 MG PO TABS: 4.0000 mg | ORAL_TABLET | ORAL | Status: DC | PRN

## 2019-11-22 MED ORDER — SIMETHICONE 80 MG PO CHEW: 80.0000 mg | CHEWABLE_TABLET | ORAL | Status: DC | PRN

## 2019-11-22 NOTE — Lactation Note (Signed)
This note was copied from a baby's chart. Lactation Consultation Note  Patient Name: Leslie Yates JFHLK'T Date: 11/22/2019 Reason for consult: Initial assessment;Late-preterm 34-36.6wks;Primapara;1st time breastfeeding;Infant < 6lbs  P1 mother whose infant is now 6 hours old.  This is a LPTI at 35+2 weeks with a CGA of 35+3 weeks weighing 5 lbs 1 oz.  Baby was swaddled and asleep when I arrived.  Mother last fed approximately 2 hours ago.  Reviewed the LPTI policy in depth with mother.  Discussed supplementation volumes.  Reminded mother to awaken and feed baby at least every three hours.  She will increase supplementation volumes today as baby desires.  Mother has been set up with the DEBP; reviewed basic information regarding pumping.  Mother currently has 13 mls of EBM at bedside.  Praised her for her efforts and asked her to continue pumping after every breast feeding attempt.  Mother stated that her nipples are too large for baby to latch.  I suggested nuzzling and licking EBM at the breast if she is unable to actually latch and feed at this time.  Mother is aware of feeding cues and informed me that her baby "shows all of the cues."  Offered to assist as needed with latching and feeding.  Mother is feeling well and has been lacking sleep due to wanting to do "everything we tell her to do" for her baby.  I discussed a plan to allow for adequate rest, nutrition and hydration in between feeding sessions.  Mother very receptive and eager to learn.  She has a friend here that will assist her.  Explained how her friend can assist.  Mother has adequate containers for milk supply.  Reviewed milk storage times also.  Mother does not currently have a DEBP for home use but her friend will have a pump for her at discharge.  Explained how this is vital to be sure the pump is ready on day of discharge to help obtain and maintain a good milk supply for baby.  Mother verbalized understanding.  FOB should  arrive soon to complete birth certificate information.Mom made aware of O/P services, breastfeeding support groups, community resources, and our phone # for post-discharge questions.   Mother will call for further questions/concerns.   Maternal Data Formula Feeding for Exclusion: No Has patient been taught Hand Expression?: Yes Does the patient have breastfeeding experience prior to this delivery?: No  Feeding    LATCH Score                   Interventions    Lactation Tools Discussed/Used WIC Program: Yes Pump Review: (Reviewed)   Consult Status Consult Status: Follow-up Date: 11/23/19 Follow-up type: In-patient    Leslie Yates R Leslie Yates 11/22/2019, 10:48 AM

## 2019-11-22 NOTE — Lactation Note (Signed)
This note was copied from a baby's chart. Lactation Consultation Note  Patient Name: Leslie Yates WCBJS'E Date: 11/22/2019 Reason for consult: Follow-up assessment   RN requested LC visit: Mother had a few questions and was desiring a NS.  Upon arriving to mother's room I assessed her concerns.  Per my note from this morning, mother does not want to latch baby to the breast at this time.  I verified that this is still her desire and she acknowledged this.  Informed her about using a NS and the process to using one. I believe she did not completely understand the purpose of a NS.  Mother desires to continue to pump and feed using her EBM.  Reminded her to continue to pump every three hours which she is doing at this time.  She has started to accumulate more volume.  I removed her EBM from the refrigerator and mother is planning to warm the milk in preparation for feeding.  I noticed a curved tip syringe at bedside but mother states she is not using that.  Per our morning discussion I asked mother to use the nipple when supplementing baby.  Mother is using the gold slow flow nipple and informed me that baby is feeding well with these nipples.  I suggested she continue using this nipple.  Suggested she increase the volume as baby desires and to increase by 5 mls at a time so as not to waste her EBM.  Mother verbalized understanding.  I suggested that a lactation consultant will be available when mother desires to latch baby to the breast.     Consult Status Consult Status: Follow-up Date: 11/23/19 Follow-up type: In-patient    Leslie Yates 11/22/2019, 6:26 PM

## 2019-11-22 NOTE — Progress Notes (Signed)
DEBP set up and initiated. Education given on set up, cleaning, and storage of milk. Mother and SO educated on late preterm infant feedings as well as early and late feeding cues. Tried to call LC per pt request, LC not not here until morning. Education on supplementing given.

## 2019-11-22 NOTE — Addendum Note (Signed)
Addendum  created 11/22/19 0804 by Algis Greenhouse, CRNA   Charge Capture section accepted, Clinical Note Signed

## 2019-11-22 NOTE — Progress Notes (Signed)
Post Partum Day 1 on Mag x 24 for HELLP syndrome with very stable course thru labor Subjective: no complaints, up ad lib, voiding, tolerating PO and + flatus breast feeding Pt was upset last night over only having one support person in postpartum after visiting hours but accepted our explanations Objective: Blood pressure 113/67, pulse 80, temperature 97.8 F (36.6 C), temperature source Oral, resp. rate 18, height 6' (1.829 m), weight 110.5 kg, last menstrual period 02/28/2019, SpO2 100 %, unknown if currently breastfeeding.  Physical Exam:  General: alert, cooperative and no distress Lochia: appropriate Uterine Fundus: firm Incision: healing well, no dehiscence DVT Evaluation: No evidence of DVT seen on physical exam.  Recent Labs    11/21/19 1728 11/22/19 0637  HGB 10.5* 9.7*  HCT 30.2* 28.1*  cmet pending .  Fibrinogen stable.  Assessment/Plan: Plan for discharge tomorrow or tuesday Complete mag later today, 10 pm   LOS: 4 days   Tilda Burrow 11/22/2019, 8:20 AM

## 2019-11-22 NOTE — Progress Notes (Signed)
Pt wanted to walk FOB out to unit door. RN asked tech to push pt in the wheelchair. Misunderstanding/ miscommunication occurred. Tech pushed pt to the door, pt went to security desk and then requested to go to [employer]. Tech informed pt that she could mobile order and we could pick it up. Pt then said "I can walk to [employer] informed pt that she in on medicine and cannot. Tech then pushed pt to [employer]. RN came to panera and took pt back to her room on [address],

## 2019-11-22 NOTE — Progress Notes (Signed)
Called to room by staffing regarding the visitation.  Pt is a new admission to the unit.  I explained to the patient that her care area has changed and so has the number of visitors who can remain overnight.  She reports "and exception was made 3 days ago, so why can't an exception be made now."  I explained that I was not here when an exception was made and that I would not be making one tonight.  She expressed understanding but stated that she would like to file a complaint.  I provided information on how to do so upon discharge.

## 2019-11-22 NOTE — Anesthesia Postprocedure Evaluation (Signed)
Anesthesia Post Note  Patient: Leslie Yates  Procedure(s) Performed: AN AD HOC LABOR EPIDURAL     Patient location during evaluation: Mother Baby Anesthesia Type: Epidural Level of consciousness: awake Pain management: satisfactory to patient Vital Signs Assessment: post-procedure vital signs reviewed and stable Respiratory status: spontaneous breathing Cardiovascular status: stable Anesthetic complications: no    Last Vitals:  Vitals:   11/22/19 0600 11/22/19 0700  BP:    Pulse:    Resp: 17 17  Temp:    SpO2:      Last Pain:  Vitals:   11/22/19 0600  TempSrc:   PainSc: Asleep   Pain Goal: Patients Stated Pain Goal: 3 (11/22/19 0504)                 Cephus Shelling

## 2019-11-22 NOTE — Plan of Care (Signed)
  Problem: Education: Goal: Knowledge of General Education information will improve Description: Including pain rating scale, medication(s)/side effects and non-pharmacologic comfort measures Outcome: Progressing   Problem: Coping: Goal: Level of anxiety will decrease Outcome: Progressing   Problem: Pain Managment: Goal: General experience of comfort will improve Outcome: Progressing   Problem: Education: Goal: Knowledge of condition will improve Outcome: Progressing   Problem: Activity: Goal: Will verbalize the importance of balancing activity with adequate rest periods Outcome: Progressing   Problem: Coping: Goal: Ability to identify and utilize available resources and services will improve Outcome: Progressing   Pt states feeling overwhelmed with the process of recovery, however states has adequate support and coping mechanisms.

## 2019-11-22 NOTE — Progress Notes (Signed)
CSW acknowledged consult and attempted to meet with MOB. However, CSW was informed by RN MOB will be on magnesium until late tonight.  CSW will meet with MOB at a later time.  Benita D. Dortha Kern, MSW, [facility] Clinical Social Worker [phone]

## 2019-11-23 LAB — COMPREHENSIVE METABOLIC PANEL
ALT: 127 U/L — ABNORMAL HIGH (ref 0–44)
AST: 50 U/L — ABNORMAL HIGH (ref 15–41)
Albumin: 2.6 g/dL — ABNORMAL LOW (ref 3.5–5.0)
Alkaline Phosphatase: 52 U/L (ref 38–126)
Anion gap: 9 (ref 5–15)
BUN: 7 mg/dL (ref 6–20)
CO2: 23 mmol/L (ref 22–32)
Calcium: 7.9 mg/dL — ABNORMAL LOW (ref 8.9–10.3)
Chloride: 105 mmol/L (ref 98–111)
Creatinine, Ser: 0.68 mg/dL (ref 0.44–1.00)
GFR calc Af Amer: >60 mL/min (ref >60)
GFR calc non Af Amer: >60 mL/min (ref >60)
Glucose, Bld: 82 mg/dL (ref 70–99)
Potassium: 3.8 mmol/L (ref 3.5–5.1)
Sodium: 137 mmol/L (ref 135–145)
Total Bilirubin: 0.5 mg/dL (ref 0.3–1.2)
Total Protein: 5.2 g/dL — ABNORMAL LOW (ref 6.5–8.1)

## 2019-11-23 LAB — CBC
HCT: 28.3 % — ABNORMAL LOW (ref 36.0–46.0)
Hemoglobin: 9.8 g/dL — ABNORMAL LOW (ref 12.0–15.0)
MCH: 31.5 pg (ref 26.0–34.0)
MCHC: 34.6 g/dL (ref 30.0–36.0)
MCV: 91 fL (ref 80.0–100.0)
Platelets: 215 10*3/uL (ref 150–400)
RBC: 3.11 MIL/uL — ABNORMAL LOW (ref 3.87–5.11)
RDW: 12.5 % (ref 11.5–15.5)
WBC: 13.3 10*3/uL — ABNORMAL HIGH (ref 4.0–10.5)
nRBC: 0 % (ref 0.0–0.2)

## 2019-11-23 MED ORDER — BENZOCAINE-MENTHOL 20-0.5 % EX AERO: 1.0000 "application " | INHALATION_SPRAY | CUTANEOUS | Status: DC | PRN

## 2019-11-23 MED ORDER — IBUPROFEN 600 MG PO TABS: 600.0000 mg | ORAL_TABLET | Freq: Four times a day (QID) | ORAL | 0 refills | Status: DC | PRN

## 2019-11-23 NOTE — Discharge Instructions (Signed)
Hypertension During Pregnancy Hypertension is also called high blood pressure. High blood pressure means that the force of your blood moving in your body is too strong. It can cause problems for you and your baby. Different types of high blood pressure can happen during pregnancy. The types are:  High blood pressure before you got pregnant. This is called chronic hypertension.  This can continue during your pregnancy. Your doctor will want to keep checking your blood pressure. You may need medicine to keep your blood pressure under control while you are pregnant. You will need follow-up visits after you have your baby.  High blood pressure that goes up during pregnancy when it was normal before. This is called gestational hypertension. It will usually get better after you have your baby, but your doctor will need to watch your blood pressure to make sure that it is getting better.  Very high blood pressure during pregnancy. This is called preeclampsia. Very high blood pressure is an emergency that needs to be checked and treated right away.  You may develop very high blood pressure after giving birth. This is called postpartum preeclampsia. This usually occurs within 48 hours after childbirth but may occur up to 6 weeks after giving birth. This is rare. How does this affect me? If you have high blood pressure during pregnancy, you have a higher chance of developing high blood pressure:  As you get older.  If you get pregnant again. In some cases, high blood pressure during pregnancy can cause:  Stroke.  Heart attack.  Damage to the kidneys, lungs, or liver.  Preeclampsia.  Jerky movements you cannot control (convulsions or seizures).  Problems with the placenta.   What can I do to lower my risk?   Keep a healthy weight.  Eat a healthy diet.  Follow what your doctor tells you about treating any medical problems that you had before becoming pregnant. It is very important to go to  all of your doctor visits. Your doctor will check your blood pressure and make sure that your pregnancy is progressing as it should. Treatment should start early if a problem is found.   Follow these instructions at home:  Take your blood pressure 1-2 times per day. Call the office if your blood pressure is 155 or higher for the top number or 105 or higher for the bottom number.    Eating and drinking   Drink enough fluid to keep your pee (urine) pale yellow.  Avoid caffeine. Lifestyle  Do not use any products that contain nicotine or tobacco, such as cigarettes, e-cigarettes, and chewing tobacco. If you need help quitting, ask your doctor.  Do not use alcohol or drugs.  Avoid stress.  Rest and get plenty of sleep.  Regular exercise can help. Ask your doctor what kinds of exercise are best for you. General instructions  Take over-the-counter and prescription medicines only as told by your doctor.  Keep all prenatal and follow-up visits as told by your doctor. This is important. Contact a doctor if:  You have symptoms that your doctor told you to watch for, such as: ? Headaches. ? Nausea. ? Vomiting. ? Belly (abdominal) pain. ? Dizziness. ? Light-headedness. Get help right away if:  You have: ? Very bad belly pain that does not get better with treatment. ? A very bad headache that does not get better. ? Vomiting that does not get better. ? Sudden, fast weight gain. ? Sudden swelling in your hands, ankles, or face. ?   Blood in your pee. ? Blurry vision. ? Double vision. ? Shortness of breath. ? Chest pain. ? Weakness on one side of your body. ? Trouble talking. Summary  High blood pressure is also called hypertension.  High blood pressure means that the force of your blood moving in your body is too strong.  High blood pressure can cause problems for you and your baby.  Keep all follow-up visits as told by your doctor. This is important. This information is  not intended to replace advice given to you by your health care provider. Make sure you discuss any questions you have with your health care provider. Document Released: 07/14/2010 Document Revised: 10/02/2018 Document Reviewed: 07/08/2018 Elsevier Patient Education  2020 Elsevier Inc.    Vaginal Delivery, Care After Refer to this sheet in the next few weeks. These discharge instructions provide you with information on caring for yourself after delivery. Your caregiver may also give you specific instructions. Your treatment has been planned according to the most current medical practices available, but problems sometimes occur. Call your caregiver if you have any problems or questions after you go home. HOME CARE INSTRUCTIONS 1. Take over-the-counter or prescription medicines only as directed by your caregiver or pharmacist. 2. Do not drink alcohol, especially if you are breastfeeding or taking medicine to relieve pain. 3. Do not smoke tobacco. 4. Continue to use good perineal care. Good perineal care includes: 1. Wiping your perineum from back to front 2. Keeping your perineum clean. 3. You can do sitz baths twice a day, to help keep this area clean 5. Do not use tampons, douche or have sex until your caregiver says it is okay. 6. Shower only and avoid sitting in submerged water, aside from sitz baths 7. Wear a well-fitting bra that provides breast support. 8. Eat healthy foods. 9. Drink enough fluids to keep your urine clear or pale yellow. 10. Eat high-fiber foods such as whole grain cereals and breads, brown rice, beans, and fresh fruits and vegetables every day. These foods may help prevent or relieve constipation. 11. Avoid constipation with high fiber foods or medications, such as miralax or metamucil 12. Follow your caregiver's recommendations regarding resumption of activities such as climbing stairs, driving, lifting, exercising, or traveling. 13. Talk to your caregiver about  resuming sexual activities. Resumption of sexual activities is dependent upon your risk of infection, your rate of healing, and your comfort and desire to resume sexual activity. 14. Try to have someone help you with your household activities and your newborn for at least a few days after you leave the hospital. 15. Rest as much as possible. Try to rest or take a nap when your newborn is sleeping. 16. Increase your activities gradually. 17. Keep all of your scheduled postpartum appointments. It is very important to keep your scheduled follow-up appointments. At these appointments, your caregiver will be checking to make sure that you are healing physically and emotionally. SEEK MEDICAL CARE IF:   You are passing large clots from your vagina. Save any clots to show your caregiver.  You have a foul smelling discharge from your vagina.  You have trouble urinating.  You are urinating frequently.  You have pain when you urinate.  You have a change in your bowel movements.  You have increasing redness, pain, or swelling near your vaginal incision (episiotomy) or vaginal tear.  You have pus draining from your episiotomy or vaginal tear.  Your episiotomy or vaginal tear is separating.  You have painful,   hard, or reddened breasts.  You have a severe headache.  You have blurred vision or see spots.  You feel sad or depressed.  You have thoughts of hurting yourself or your newborn.  You have questions about your care, the care of your newborn, or medicines.  You are dizzy or light-headed.  You have a rash.  You have nausea or vomiting.  You were breastfeeding and have not had a menstrual period within 12 weeks after you stopped breastfeeding.  You are not breastfeeding and have not had a menstrual period by the 12th week after delivery.  You have a fever. SEEK IMMEDIATE MEDICAL CARE IF:   You have persistent pain.  You have chest pain.  You have shortness of breath.  You  faint.  You have leg pain.  You have stomach pain.  Your vaginal bleeding saturates two or more sanitary pads in 1 hour. MAKE SURE YOU:   Understand these instructions.  Will watch your condition.  Will get help right away if you are not doing well or get worse. Document Released: 06/08/2000 Document Revised: 10/26/2013 Document Reviewed: 02/06/2012 ExitCare Patient Information 2015 [employer]. This information is not intended to replace advice given to you by your health care provider. Make sure you discuss any questions you have with your health care provider.  Sitz Bath A sitz bath is a warm water bath taken in the sitting position. The water covers only the hips and butt (buttocks). We recommend using one that fits in the toilet, to help with ease of use and cleanliness. It may be used for either healing or cleaning purposes. Sitz baths are also used to relieve pain, itching, or muscle tightening (spasms). The water may contain medicine. Moist heat will help you heal and relax.  HOME CARE  Take 3 to 4 sitz baths a day. 18. Fill the bathtub half-full with warm water. 19. Sit in the water and open the drain a little. 20. Turn on the warm water to keep the tub half-full. Keep the water running constantly. 21. Soak in the water for 15 to 20 minutes. 22. After the sitz bath, pat the affected area dry. GET HELP RIGHT AWAY IF: You get worse instead of better. Stop the sitz baths if you get worse. MAKE SURE YOU:  Understand these instructions.  Will watch your condition.  Will get help right away if you are not doing well or get worse. Document Released: 07/19/2004 Document Revised: 03/05/2012 Document Reviewed: 10/09/2010 ExitCare Patient Information 2015 [employer]. This information is not intended to replace advice given to you by your health care provider. Make sure you discuss any questions you have with your health care provider.    

## 2019-11-23 NOTE — Progress Notes (Signed)
Post Partum Day 2 s/p IOL for HELLP syndrome, has completed Mag Sulfate x 24 hr. Morning labs pending.  Subjective: no complaints, up ad lib, voiding and tolerating PO  Breast feeding,    Objective: Blood pressure 118/66, pulse 68, temperature 98 F (36.7 C), temperature source Oral, resp. rate 18, height 6' (1.829 m), weight 110.5 kg, last menstrual period 02/28/2019, SpO2 100 %, unknown if currently breastfeeding.  Physical Exam:  General: alert, cooperative, appears stated age and mildly obese Lochia: appropriate Uterine Fundus: firm Incision:  DVT Evaluation: No evidence of DVT seen on physical exam. No cords or calf tenderness.  Recent Labs    11/21/19 1728 11/22/19 0637  HGB 10.5* 9.7*  HCT 30.2* 28.1*   Awaiting morning labs. CBC Latest Ref Rng & Units 11/23/2019 11/22/2019 11/21/2019  WBC 4.0 - 10.5 K/uL 13.3(H) 16.0(H) 14.9(H)  Hemoglobin 12.0 - 15.0 g/dL 7.7(A) 1.2(I) 10.5(L)  Hematocrit 36.0 - 46.0 % 28.3(L) 28.1(L) 30.2(L)  Platelets 150 - 400 K/uL 215 192 196   CMP Latest Ref Rng & Units 11/23/2019 11/22/2019 11/21/2019  Glucose 70 - 99 mg/dL 82 93 786(V)  BUN 6 - 20 mg/dL 7 <6(H) <2(C)  Creatinine 0.44 - 1.00 mg/dL 9.47 0.96 2.83  Sodium 135 - 145 mmol/L 137 135 139  Potassium 3.5 - 5.1 mmol/L 3.8 3.4(L) 3.1(L)  Chloride 98 - 111 mmol/L 105 106 105  CO2 22 - 32 mmol/L 23 24 25   Calcium 8.9 - 10.3 mg/dL 7.9(L) 6.9(L) 6.8(L)  Total Protein 6.5 - 8.1 g/dL 5.2(L) 5.0(L) 5.5(L)  Total Bilirubin 0.3 - 1.2 mg/dL 0.5 0.8 0.6  Alkaline Phos 38 - 126 U/L 52 47 51  AST 15 - 41 U/L 50(H) 120(H) 152(H)  ALT 0 - 44 U/L 127(H) 183(H) 202(H)    Assessment/Plan: Discharge home   LOS: 5 days   11/23/2019, 7:42 AM

## 2019-11-23 NOTE — Progress Notes (Signed)
Discharge teaching complete with pt. Signs and symptoms of pre-e reviewed and handout given. Meds discussed. Baby made baby pt.

## 2019-11-23 NOTE — Lactation Note (Signed)
This note was copied from a baby's chart. Lactation Consultation Note  Patient Name: Leslie Yates [id number] Date: 11/23/2019 Reason for consult: Follow-up assessment;Late-preterm 34-36.6wks;Infant < 6lbs   Mom states infant latched earlier but it was painful.  Previous LC mentioned to mom about trying a NS with future BF.  Infant cueing in bassinet.  Infant placed at breast but only latched to nipple.  Mom is tender around nipple area.    LC reviewed hand expression.  Mom has plentiful drops come out with one compression.  LC encouraged mom to do this prior to and after each BF and pumping session.  NS size 24 placed and mom and dad observed application.  Curved tip syringe used to prefill with mom's EBM.  Mom has large pendulous breasts; rolled cloth used to better support breast.  Mom latched infant in football hold.  Infant latched to breast at first non-nutritive sucking observed then rhythmic jaw movement noted and swallows heard one after another.  No visible shield during feeding.  Parents educated on massage and compression of breast during feeding to stimulate infant and keep infant awake.  Infant fed at the breast for 20 minutes, mom was taught how to break latch.  Infant was then finger feed with curved tip syringe EBM from a recent pump (16ml).  LC did this while family observed.    Then dad bottle fed infant [id number].  LC reviewed paced bottle feeding, LPTI feeding guidelines and importance of supplementing infant after each breastfeed, DEBP use and importance of continuing to pump.  Parents were reminded to limit feedings to 30 minutes (bf and supplementation) and to not go longer than 3 hours without feeding.    Mom is aware of lactation phone line, OP services, support group offerings, and was encouraged to call to set up an OP LC appt.    Mom has WIC but states she does have a pump at home.  LC reminded her to take her pump parts/tubing home with her.    All questions  answered.    Maternal Data Has patient been taught Hand Expression?: Yes  Feeding Feeding Type: Breast Fed Nipple Type: Slow - flow  LATCH Score Latch: Repeated attempts needed to sustain latch, nipple held in mouth throughout feeding, stimulation needed to elicit sucking reflex.  Audible Swallowing: A few with stimulation  Type of Nipple: Everted at rest and after stimulation  Comfort (Breast/Nipple): Soft / non-tender  Hold (Positioning): Assistance needed to correctly position infant at breast and maintain latch.  LATCH Score: 7  Interventions Interventions: Breast feeding basics reviewed;Assisted with latch;Skin to skin;Breast massage;Hand express;Expressed milk;Support pillows;Adjust position  Lactation Tools Discussed/Used Tools: Nipple Shields(curved tip syringe) Nipple shield size: 24 WIC Program: Yes   Consult Status Consult Status: Follow-up Date: 11/24/19 Follow-up type: In-patient    Leslie Yates [facility] 11/23/2019, 8:41 AM

## 2019-11-23 NOTE — Clinical Social Work Maternal (Addendum)
CLINICAL SOCIAL WORK MATERNAL/CHILD NOTE  Patient Details  Name: Leslie Yates MRN: [id number] Date of Birth: [birth year]  Date:  11/23/2019  Clinical Social Worker Initiating Note:  Laurey Arrow Date/Time: Initiated:  11/23/19/1150     Child's Name:  Leonarda Salon   Biological Parents:  Mother, Father(FOB is [name] (11/01/1983))   Need for Interpreter:  None   Reason for Referral:  Current Substance Use/Substance Use During Pregnancy    Address:  [address] [place] [address]    Phone number:  [phone] (home)     Additional phone number: FOB's telephone number is [phone]  Household Members/Support Persons (HM/SP):       HM/SP Name Relationship DOB or Age  HM/SP -1        HM/SP -2        HM/SP -3        HM/SP -4        HM/SP -5        HM/SP -6        HM/SP -7        HM/SP -8          Natural Supports (not living in the home):      Professional Supports: None   Employment: Ship broker   Type of Work:     Education:  Attending college   Homebound arranged:    Museum/gallery curator Resources:  [employer]   Other Resources:  [employer], Physicist, medical    Cultural/Religious Considerations Which May Impact Care:  Per [employer] face sheet MOB is [employer].   Strengths:  Ability to meet basic needs , Home prepared for child (Peds list provided to MOB.)   Psychotropic Medications:         Pediatrician:       Pediatrician List:   [place]      Pediatrician Fax Number:    Risk Factors/Current Problems:  Substance Use (hx of Marijuana use.)   Cognitive State:  Able to Concentrate , Alert , Insightful , Linear Thinking , Goal Oriented    Mood/Affect:  Interested , Comfortable , Relaxed    CSW Assessment: CSW met with MOB in room 115 to complete an assessment for a consult for hx of THC use in pregnancy.  MOB was polite and was receptive with  meeting with CSW.  When CSW arrived, MOB was bonding with infant as evident by MOB holding infant and engaging in infant massages.  FOB was also present and with MOB's permission CSW asked FOB to leave in order to meet with MOB in private.  CSW inquired about MOB's substance use, and MOB reported utilizing marijuana during pregnancy to assist with decreasing MOB's nausea and increasing her appetite. MOB reported last use of a marijuana was about 1 month ago. CSW informed MOB of the hospital's drug screen policy. MOB was made aware of the 2 drug screenings for the infant.  MOB was understanding and did not have any questions or concerns.  CSW shared with MOB that the infant's UDS is negative and CSW will continue to monitor infant's CDS and will make a report to [place] is warranted. CSW offered MOB resources and referrals for substance interventions and MOB declined. MOB also denied having any CPS hx. MOB did not have any questions about the hospital's policy and reported having all essential items for  infant including a new car seat and 2 safe sleeping areas.  CSW Plan/Description:  No Further Intervention Required/No Barriers to Discharge, Sudden Infant Death Syndrome (SIDS) Education, Perinatal Mood and Anxiety Disorder (PMADs) Education, Other Patient/Family Education, [place], Other Information/Referral to [employer], CSW Will Continue to Monitor Umbilical Cord Tissue Drug Screen Results and Make Report if Warranted   Laurey Arrow, MSW, LCSW Clinical Social Work [phone]  Dimple Nanas, [place] 11/23/2019, 12:01 PM

## 2019-11-24 ENCOUNTER — Ambulatory Visit: Payer: Self-pay

## 2019-11-24 ENCOUNTER — Other Ambulatory Visit: Payer: Self-pay

## 2019-11-24 NOTE — Lactation Note (Signed)
This note was copied from a baby's chart. Lactation Consultation Note  Patient Name: Girl Hena Ewalt [id number]'[id number] Date: 11/24/2019 Reason for consult: Follow-up assessment;Infant < 6lbs    Baby 61 hours old.  < 5 lbs.  [redacted]w[redacted]d CGA. Mother states she is bottle feeding for now but knows to call for OP appt when ready to work on latching. Mother states her Pediatrician recommended alternating between 22 cal formula and breastmilk bottle feeds. She is only pumping 4 times per day and is expressing approx 100 ml per session. Suggest pumping a minimum of 8 times per day. Mother has DEBP at home but suggest she contact [facility] for quality DEBP since she will be pumping frequently.  LC faxed WIC pump referral with note. Discussed engorgement care. Reviewed volume guidelines increasing per day of life and as baby desires. Recommend feeding on demand but at least q 3 hours.    Maternal Data    Feeding Feeding Type: Bottle Fed - Breast Milk  LATCH Score                   Interventions Interventions: DEBP  Lactation Tools Discussed/Used     Consult Status Consult Status: Complete Date: 11/24/19    [name] [facility] 11/24/2019, 10:48 AM

## 2019-11-25 LAB — SURGICAL PATHOLOGY

## 2019-11-26 ENCOUNTER — Encounter: Payer: Medicaid Other | Admitting: Obstetrics and Gynecology

## 2019-12-01 ENCOUNTER — Ambulatory Visit: Payer: Medicaid Other

## 2019-12-01 ENCOUNTER — Other Ambulatory Visit: Payer: Self-pay

## 2019-12-01 ENCOUNTER — Ambulatory Visit (INDEPENDENT_AMBULATORY_CARE_PROVIDER_SITE_OTHER): Payer: Medicaid Other | Admitting: *Deleted

## 2019-12-01 ENCOUNTER — Other Ambulatory Visit (HOSPITAL_COMMUNITY): Admit: 2019-12-01 | Payer: Medicaid Other

## 2019-12-01 VITALS — BP 121/76 | HR 83 | Temp 97.9°F | Ht 73.0 in | Wt 238.0 lb

## 2019-12-01 DIAGNOSIS — O165 Unspecified maternal hypertension, complicating the puerperium: Secondary | ICD-10-CM | POA: Diagnosis not present

## 2019-12-01 NOTE — Progress Notes (Signed)
° °  Subjective:  Leslie Yates is a 29 y.o. female here for BP check. Patient s/p vaginal delivery 11/21/19 at [redacted]w[redacted]d gestation for gestation hypertension, elevated liver enzymes (HELLP syndrome).  Hypertension ROS: patient does not perform home BP monitoring, no TIA's, possible neurological symptoms Headache, with relief from Ibuprofen and/or Tylenol, no chest pain on exertion, no dyspnea on exertion, no swelling of ankles, no orthostatic dizziness or lightheadedness, no orthopnea or paroxysmal nocturnal dyspnea, noting orthopnea and no palpitations.    Objective:  BP 121/76 (BP Location: Right Arm, Patient Position: Sitting, Cuff Size: Large)    Pulse 83    Temp 97.9 F (36.6 C) (Oral)    Ht 6\' 1"  (1.854 m)    Wt 238 lb (108 kg)    LMP 02/28/2019 (Approximate) Comment: neg preg test 10/27/2018   Breastfeeding Yes    BMI 31.40 kg/m   Appearance alert, well appearing, and in no distress, oriented to person, place, and time and normal appearing weight. General exam BP noted to be well controlled today in office.    Assessment:   Blood Pressure well controlled and stable.   Plan:  Current treatment plan is effective, no change in therapy.. CBC, CMP, Protein/Creatine completed.  12/27/2018, RN

## 2019-12-02 ENCOUNTER — Telehealth: Payer: Self-pay | Admitting: *Deleted

## 2019-12-02 DIAGNOSIS — O9081 Anemia of the puerperium: Secondary | ICD-10-CM

## 2019-12-02 LAB — COMPREHENSIVE METABOLIC PANEL
ALT: 17 IU/L (ref 0–32)
AST: 13 IU/L (ref 0–40)
Albumin/Globulin Ratio: 1.5 (ref 1.2–2.2)
Albumin: 3.6 g/dL — ABNORMAL LOW (ref 3.9–5.0)
Alkaline Phosphatase: 45 IU/L — ABNORMAL LOW (ref 48–121)
BUN/Creatinine Ratio: 9 (ref 9–23)
BUN: 7 mg/dL (ref 6–20)
Bilirubin Total: 0.4 mg/dL (ref 0.0–1.2)
CO2: 23 mmol/L (ref 20–29)
Calcium: 8.9 mg/dL (ref 8.7–10.2)
Chloride: 108 mmol/L — ABNORMAL HIGH (ref 96–106)
Creatinine, Ser: 0.77 mg/dL (ref 0.57–1.00)
GFR calc Af Amer: 122 mL/min/{1.73_m2} (ref >59)
GFR calc non Af Amer: 105 mL/min/{1.73_m2} (ref >59)
Globulin, Total: 2.4 g/dL (ref 1.5–4.5)
Glucose: 89 mg/dL (ref 65–99)
Potassium: 4 mmol/L (ref 3.5–5.2)
Sodium: 142 mmol/L (ref 134–144)
Total Protein: 6 g/dL (ref 6.0–8.5)

## 2019-12-02 LAB — CBC
Hematocrit: 28.1 % — ABNORMAL LOW (ref 34.0–46.6)
Hemoglobin: 9.8 g/dL — ABNORMAL LOW (ref 11.1–15.9)
MCH: 31.8 pg (ref 26.6–33.0)
MCHC: 34.9 g/dL (ref 31.5–35.7)
MCV: 91 fL (ref 79–97)
Platelets: 295 10*3/uL (ref 150–450)
RBC: 3.08 x10E6/uL — ABNORMAL LOW (ref 3.77–5.28)
RDW: 13.1 % (ref 11.7–15.4)
WBC: 6.1 10*3/uL (ref 3.4–10.8)

## 2019-12-02 LAB — PROTEIN / CREATININE RATIO, URINE
Creatinine, Urine: 100.5 mg/dL
Protein, Ur: 19.4 mg/dL
Protein/Creat Ratio: 193 mg/g creat (ref 0–200)

## 2019-12-02 MED ORDER — ASCORBIC ACID 500 MG PO TABS: 500.0000 mg | ORAL_TABLET | ORAL | 2 refills | Status: DC

## 2019-12-02 MED ORDER — IRON 325 (65 FE) MG PO TABS: 1.0000 | ORAL_TABLET | ORAL | 2 refills | Status: DC

## 2019-12-02 NOTE — Telephone Encounter (Signed)
-----   Message from [place], [place] sent at 12/02/2019  9:52 AM EDT ----- Please Rx FeSO4 325 mg BID every other day with Vitamin C 500 mg BID every other day.

## 2019-12-03 ENCOUNTER — Inpatient Hospital Stay (HOSPITAL_COMMUNITY): Payer: Medicaid Other

## 2019-12-03 ENCOUNTER — Inpatient Hospital Stay (HOSPITAL_COMMUNITY)
Admission: AD | Admit: 2019-12-03 | Payer: Medicaid Other | Source: Home / Self Care | Admitting: Obstetrics and Gynecology

## 2019-12-08 ENCOUNTER — Other Ambulatory Visit: Payer: Self-pay | Admitting: *Deleted

## 2019-12-08 NOTE — Patient Outreach (Signed)
[facility]) Care Management  12/08/2019  Leslie Yates 1990-08-31 [id number]   Nurse Call Center Call-RESOLVED  RN responded to a call reporting a severe headache post delivery of a baby 15 days ago. Pt advised to go to the ED and call 911 however pt did not pursue this advise from the nurse line. RN referred to follow up on outcome.  RN spoke with pt today and inquired further on pt's current status. Pt states she did not go to the ED and did not take the  Recommended medication (Ibuprofen) because she "does not like taking medications". RN explained the importance of prevention measures post delivery and why it is importance to follow through with any severe symptoms that may put of at risk for acute problems from occuring. Educated pt on risk involved if she does not take action with her acute symptoms. Encouraged the pt to take the recommended medication however is she knows she is not going to take the medication to alert her providers who may have other options.  Pt explained further that she evenually took the medications around midnight and her symptoms "went away". Pt currently denies any ongoing headaches with no slurred speech or symptoms of disorientation as reported earlier by her fiancee. Pt states she is "doing fine" and all issues have resolved.   RN strongly encouraged pt on actions to take if symptoms occur post delivery. Pt aware to initial take the recommended medications with any onset of discomfort if note severe await a response on the effectiveness of the medication. If no relief or symptoms get worse to call her provider for additional interventions however if severe as experienced earlier seek medical attention immediately by calling 911 as this maybe life threatening. Pt verbalized an understanding as she reports back on the process with teach-back method in communicating this with the pt.    Inquired on any other issues that need attention at this time. Pt  very grateful and indicates no needs. Note pt indicated she is no longer with Dr. Meredeth Ide for her primary provider. Pt is with [facility] since her pregnancy.  Plan: Will closed with no additional needs at this time.   Elliot Cousin, RN Care Management Coordinator [facility] Main Office [phone]

## 2019-12-24 ENCOUNTER — Other Ambulatory Visit (HOSPITAL_COMMUNITY)
Admission: RE | Admit: 2019-12-24 | Discharge: 2019-12-24 | Disposition: A | Discharge status: Home / Self Care | Payer: Medicaid Other | Source: Ambulatory Visit | Attending: Obstetrics and Gynecology | Admitting: Obstetrics and Gynecology

## 2019-12-24 ENCOUNTER — Other Ambulatory Visit: Payer: Self-pay

## 2019-12-24 ENCOUNTER — Encounter: Payer: Self-pay | Admitting: Obstetrics and Gynecology

## 2019-12-24 ENCOUNTER — Ambulatory Visit (INDEPENDENT_AMBULATORY_CARE_PROVIDER_SITE_OTHER): Payer: Medicaid Other | Admitting: Obstetrics and Gynecology

## 2019-12-24 DIAGNOSIS — F53 Postpartum depression: Secondary | ICD-10-CM

## 2019-12-24 DIAGNOSIS — Z124 Encounter for screening for malignant neoplasm of cervix: Secondary | ICD-10-CM | POA: Insufficient documentation

## 2019-12-24 DIAGNOSIS — O99345 Other mental disorders complicating the puerperium: Secondary | ICD-10-CM

## 2019-12-24 DIAGNOSIS — Z30011 Encounter for initial prescription of contraceptive pills: Secondary | ICD-10-CM

## 2019-12-24 MED ORDER — NORGESTIM-ETH ESTRAD TRIPHASIC 0.18/0.215/0.25 MG-25 MCG PO TABS: 1.0000 | ORAL_TABLET | Freq: Every day | ORAL | 12 refills | Status: DC
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
## 2019-12-24 NOTE — Patient Instructions (Signed)
Postpartum Baby Blues The postpartum period begins right after the birth of a baby. During this time, there is often a lot of joy and excitement. It is also a time of many changes in the life of the parents. No matter how many times a mother gives birth, each child brings new challenges to the family, including different ways of relating to one another. It is common to have feelings of excitement along with confusing changes in moods, emotions, and thoughts. You may feel happy one minute and sad or stressed the next. These feelings of sadness usually happen in the period right after you have your baby, and they go away within a week or two. This is called the "baby blues." What are the causes? There is no known cause of baby blues. It is likely caused by a combination of factors. However, changes in hormone levels after childbirth are believed to trigger some of the symptoms. Other factors that can play a role in these mood changes include:  Lack of sleep.  Stressful life events, such as poverty, caring for a loved one, or death of a loved one.  Genetics. What are the signs or symptoms? Symptoms of this condition include:  Brief changes in mood, such as going from extreme happiness to sadness.  Decreased concentration.  Difficulty sleeping.  Crying spells and tearfulness.  Loss of appetite.  Irritability.  Anxiety. If the symptoms of baby blues last for more than 2 weeks or become more severe, you may have postpartum depression. How is this diagnosed? This condition is diagnosed based on an evaluation of your symptoms. There are no medical or lab tests that lead to a diagnosis, but there are various questionnaires that a health care provider may use to identify women with the baby blues or postpartum depression. How is this treated? Treatment is not needed for this condition. The baby blues usually go away on their own in 1-2 weeks. Social support is often all that is needed. You will  be encouraged to get adequate sleep and rest. Follow these instructions at home: Lifestyle      Get as much rest as you can. Take a nap when the baby sleeps.  Exercise regularly as told by your health care provider. Some women find yoga and walking to be helpful.  Eat a balanced and nourishing diet. This includes plenty of fruits and vegetables, whole grains, and lean proteins.  Do little things that you enjoy. Have a cup of tea, take a bubble bath, read your favorite magazine, or listen to your favorite music.  Avoid alcohol.  Ask for help with household chores, cooking, grocery shopping, or running errands. Do not try to do everything yourself. Consider hiring a postpartum doula to help. This is a professional who specializes in providing support to new mothers.  Try not to make any major life changes during pregnancy or right after giving birth. This can add stress. General instructions  Talk to people close to you about how you are feeling. Get support from your partner, family members, friends, or other new moms. You may want to join a support group.  Find ways to cope with stress. This may include: ? Writing your thoughts and feelings in a journal. ? Spending time outside. ? Spending time with people who make you laugh.  Try to stay positive in how you think. Think about the things you are grateful for.  Take over-the-counter and prescription medicines only as told by your health care provider.    Let your health care provider know if you have any concerns.  Keep all postpartum visits as told by your health care provider. This is important. Contact a health care provider if:  Your baby blues do not go away after 2 weeks. Get help right away if:  You have thoughts of taking your own life (suicidal thoughts).  You think you may harm the baby or other people.  You see or hear things that are not there (hallucinations). Summary  After giving birth, you may feel happy  one minute and sad or stressed the next. Feelings of sadness that happen right after the baby is born and go away after a week or two are called the "baby blues."  You can manage the baby blues by getting enough rest, eating a healthy diet, exercising, spending time with supportive people, and finding ways to cope with stress.  If feelings of sadness and stress last longer than 2 weeks or get in the way of caring for your baby, talk to your health care provider. This may mean you have postpartum depression. This information is not intended to replace advice given to you by your health care provider. Make sure you discuss any questions you have with your health care provider. Document Revised: 10/03/2018 Document Reviewed: 08/07/2016 Elsevier Patient Education  2020 [employer]. Oral Contraception Use Oral contraceptive pills (OCPs) are medicines that you take to prevent pregnancy. OCPs work by:  Preventing the ovaries from releasing eggs.  Thickening mucus in the lower part of the uterus (cervix), which prevents sperm from entering the uterus.  Thinning the lining of the uterus (endometrium), which prevents a fertilized egg from attaching to the endometrium. OCPs are highly effective when taken exactly as prescribed. However, OCPs do not prevent sexually transmitted infections (STIs). Safe sex practices, such as using condoms while on an OCP, can help prevent STIs. Before taking OCPs, you may have a physical exam, blood test, and Pap test. A Pap test involves taking a sample of cells from your cervix to check for cancer. Discuss with your health care provider the possible side effects of the OCP you may be prescribed. When you start an OCP, be aware that it can take 2-3 months for your body to adjust to changes in hormone levels. How to take oral contraceptive pills Follow instructions from your health care provider about how to start taking your first cycle of OCPs. Your health care provider  may recommend that you:  Start the pill on day 1 of your menstrual period. If you start at this time, you will not need any backup form of birth control (contraception), such as condoms.  Start the pill on the first Sunday after your menstrual period or on the day you get your prescription. In these cases, you will need to use backup contraception for the first week.  Start the pill at any time of your cycle. ? If you take the pill within 5 days of the start of your period, you will not need a backup form of contraception. ? If you start at any other time of your menstrual cycle, you will need to use another form of contraception for 7 days. If your OCP is the type called a minipill, it will protect you from pregnancy after taking it for 2 days (48 hours), and you can stop using backup contraception after that time. After you have started taking OCPs:  If you forget to take 1 pill, take it as soon as you  remember. Take the next pill at the regular time.  If you miss 2 or more pills, call your health care provider. Different pills have different instructions for missed doses. Use backup birth control until your next menstrual period starts.  If you use a 28-day pack that contains inactive pills and you miss 1 of the last 7 pills (pills with no hormones), throw away the rest of the non-hormone pills and start a new pill pack. No matter which day you start the OCP, you will always start a new pack on that same day of the week. Have an extra pack of OCPs and a backup contraceptive method available in case you miss some pills or lose your OCP pack. Follow these instructions at home:  Do not use any products that contain nicotine or tobacco, such as cigarettes and e-cigarettes. If you need help quitting, ask your health care provider.  Always use a condom to protect against STIs. OCPs do not protect against STIs.  Use a calendar to mark the days of your menstrual period.  Read the information and  directions that came with your OCP. Talk to your health care provider if you have questions. Contact a health care provider if:  You develop nausea and vomiting.  You have abnormal vaginal discharge or bleeding.  You develop a rash.  You miss your menstrual period. Depending on the type of OCP you are taking, this may be a sign of pregnancy. Ask your health care provider for more information.  You are losing your hair.  You need treatment for mood swings or depression.  You get dizzy when taking the OCP.  You develop acne after taking the OCP.  You become pregnant or think you may be pregnant.  You have diarrhea, constipation, and abdominal pain or cramps.  You miss 2 or more pills. Get help right away if:  You develop chest pain.  You develop shortness of breath.  You have an uncontrolled or severe headache.  You develop numbness or slurred speech.  You develop visual or speech problems.  You develop pain, redness, and swelling in your legs.  You develop weakness or numbness in your arms or legs. Summary  Oral contraceptive pills (OCPs) are medicines that you take to prevent pregnancy.  OCPs do not prevent sexually transmitted infections (STIs). Always use a condom to protect against STIs.  When you start an OCP, be aware that it can take 2-3 months for your body to adjust to changes in hormone levels.  Read all the information and directions that come with your OCP. This information is not intended to replace advice given to you by your health care provider. Make sure you discuss any questions you have with your health care provider. Document Revised: 10/03/2018 Document Reviewed: 07/23/2016 Elsevier Patient Education  2020 [employer]. Oral Contraception Information Oral contraceptive pills (OCPs) are medicines taken to prevent pregnancy. OCPs are taken by mouth, and they work by:  Preventing the ovaries from releasing eggs.  Thickening mucus in the lower  part of the uterus (cervix), which prevents sperm from entering the uterus.  Thinning the lining of the uterus (endometrium), which prevents a fertilized egg from attaching to the endometrium. OCPs are highly effective when taken exactly as prescribed. However, OCPs do not prevent STIs (sexually transmitted infections). Safe sex practices, such as using condoms while on an OCP, can help prevent STIs. Before starting OCPs Before you start taking OCPs, you may have a physical exam, blood test, and  Pap test. However, you are not required to have a pelvic exam in order to be prescribed OCPs. Your health care provider will make sure you are a good candidate for oral contraception. OCPs are not a good option for certain women, including women who smoke and are older than 35 years, and women with a medical history of high blood pressure, deep vein thrombosis, pulmonary embolism, stroke, cardiovascular disease, or peripheral vascular disease. Discuss with your health care provider the possible side effects of the OCP you may be prescribed. When you start an OCP, be aware that it can take 2-3 months for your body to adjust to changes in hormone levels. Follow instructions from your health care provider about how to start taking your first cycle of OCPs. Depending on when you start the pill, you may need to use a backup form of birth control, such as condoms, during the first week. Make sure you know what steps to take if you ever forget to take the pill. Types of oral contraception  The most common types of birth control pills contain the hormones estrogen and progestin (synthetic progesterone) or progestin only. The combination pill This type of pill contains estrogen and progestin hormones. Combination pills often come in packs of 21, 28, or 91 pills. For each pack, the last 7 pills may not contain hormones, which means you may stop taking the pills for 7 days. Menstrual bleeding occurs during the week that you  do not take the pills or that you take the pills with no hormones in them. The minipill This type of pill contains the progestin hormone only. It comes in packs of 28 pills. All 28 pills contain the hormone. You take the pill every day. It is very important to take the pill at the same time each day. Advantages of oral contraceptive pills  Provides reliable and continuous contraception if taken as instructed.  May treat or decrease symptoms of: ? Menstrual period cramps. ? Irregular menstrual cycle or bleeding. ? Heavy menstrual flow. ? Abnormal uterine bleeding. ? Acne, depending on the type of pill. ? Polycystic ovarian syndrome. ? Endometriosis. ? Iron deficiency anemia. ? Premenstrual symptoms, including premenstrual dysphoric disorder.  May reduce the risk of endometrial and ovarian cancer.  Can be used as emergency contraception.  Prevents mislocated (ectopic) pregnancies and infections of the fallopian tubes. Things that can make oral contraceptive pills less effective OCPs can be less effective if:  You forget to take the pill at the same time every day. This is especially important when taking the minipill.  You have a stomach or intestinal disease that reduces your body's ability to absorb the pill.  You take OCPs with other medicines that make OCPs less effective, such as antibiotics, certain HIV medicines, and some seizure medicines.  You take expired OCPs.  You forget to restart the pill on day 7, if using the packs of 21 pills. Risks associated with oral contraceptive pills Oral contraceptive pills can sometimes cause side effects, such as:  Headache.  Depression.  Trouble sleeping.  Nausea and vomiting.  Breast tenderness.  Irregular bleeding or spotting during the first several months.  Bloating or fluid retention.  Increase in blood pressure. Combination pills are also associated with a small increase in the risk of:  Blood clots.  Heart  attack.  Stroke. Summary  Oral contraceptive pills are medicines taken by mouth to prevent pregnancy. They are highly effective when taken exactly as prescribed.  The most common types  of birth control pills contain the hormones estrogen and progestin (synthetic progesterone) or progestin only.  Before you start taking the pill, you may have a physical exam, blood test, and Pap test. Your health care provider will make sure you are a good candidate for oral contraception.  The combination pill may come in a 21-day pack, a 28-day pack, or a 91-day pack. The minipill contains the progesterone hormone only and comes in packs of 28 pills.  Oral contraceptive pills can sometimes cause side effects, such as headache, nausea, breast tenderness, or irregular bleeding. This information is not intended to replace advice given to you by your health care provider. Make sure you discuss any questions you have with your health care provider. Document Revised: 05/24/2017 Document Reviewed: 09/04/2016 Elsevier Patient Education  2020 [employer].

## 2019-12-25 LAB — CYTOLOGY - PAP: Diagnosis: NEGATIVE

## 2020-01-06 ENCOUNTER — Ambulatory Visit (INDEPENDENT_AMBULATORY_CARE_PROVIDER_SITE_OTHER): Payer: Medicaid Other | Admitting: Licensed Clinical Social Worker

## 2020-01-06 ENCOUNTER — Other Ambulatory Visit: Payer: Self-pay

## 2020-01-06 DIAGNOSIS — F4322 Adjustment disorder with anxiety: Secondary | ICD-10-CM | POA: Diagnosis not present
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
## 2020-01-12 ENCOUNTER — Ambulatory Visit (INDEPENDENT_AMBULATORY_CARE_PROVIDER_SITE_OTHER): Payer: Medicaid Other | Admitting: Primary Care

## 2020-01-12 ENCOUNTER — Other Ambulatory Visit (HOSPITAL_COMMUNITY)
Admission: RE | Admit: 2020-01-12 | Discharge: 2020-01-12 | Disposition: A | Discharge status: Home / Self Care | Payer: Medicaid Other | Source: Ambulatory Visit | Attending: Primary Care | Admitting: Primary Care

## 2020-01-12 ENCOUNTER — Other Ambulatory Visit: Payer: Self-pay

## 2020-01-12 ENCOUNTER — Encounter (INDEPENDENT_AMBULATORY_CARE_PROVIDER_SITE_OTHER): Payer: Self-pay | Admitting: Primary Care

## 2020-01-12 VITALS — BP 130/82 | HR 59 | Ht 71.1 in | Wt 245.0 lb

## 2020-01-12 DIAGNOSIS — R030 Elevated blood-pressure reading, without diagnosis of hypertension: Secondary | ICD-10-CM | POA: Diagnosis not present

## 2020-01-12 DIAGNOSIS — N898 Other specified noninflammatory disorders of vagina: Secondary | ICD-10-CM | POA: Insufficient documentation

## 2020-01-12 DIAGNOSIS — D649 Anemia, unspecified: Secondary | ICD-10-CM

## 2020-01-12 DIAGNOSIS — Z7689 Persons encountering health services in other specified circumstances: Secondary | ICD-10-CM
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
## 2020-01-12 NOTE — Patient Instructions (Signed)
MANAGEMENT OF CHRONIC CONSTIPATION  Drink fluids in the recommended amount everyday. Recommend amount is 8 cups of water daily. Do not replace water with Gatorade or Powerade as these should only be used when you are dehydrated.  Eat lots of high fiber foods-fruits, veggies, bran and whole grain instead of white bread Be active everyday. Inactivity makes constipation worse. Add psyllium daily (Metamucil) which comes in capsules now. Start very low dose and work up to recommended dose on bottle daily. Stay away from Milk of Magnesia or any magnesium containing laxative, unless you need it to clear things out rarely. It is an addictive laxative and your gut will become dependent on it. If that is not working, I would start Miralax, which you can buy in generic 17 gms daily. It's a powder and not an "addictive laxative". Take it every day and titrate the dose up or down to get the daily Bm. We will consider the use of other pharmacological treatments should the above recommendations prove to be unsuccessful.   

## 2020-01-13 ENCOUNTER — Other Ambulatory Visit (INDEPENDENT_AMBULATORY_CARE_PROVIDER_SITE_OTHER): Payer: Self-pay | Admitting: Primary Care

## 2020-01-13 DIAGNOSIS — B9689 Other specified bacterial agents as the cause of diseases classified elsewhere: Secondary | ICD-10-CM

## 2020-01-13 LAB — CERVICOVAGINAL ANCILLARY ONLY
Bacterial Vaginitis (gardnerella): POSITIVE — AB
Candida Glabrata: NEGATIVE
Candida Vaginitis: NEGATIVE
Chlamydia: NEGATIVE
Comment: NEGATIVE
Comment: NEGATIVE
Comment: NEGATIVE
Comment: NEGATIVE
Comment: NEGATIVE
Comment: NORMAL
Neisseria Gonorrhea: NEGATIVE
Trichomonas: NEGATIVE

## 2020-01-13 MED ORDER — METRONIDAZOLE 500 MG PO TABS: 500.0000 mg | ORAL_TABLET | Freq: Two times a day (BID) | ORAL | 0 refills | Status: DC

## 2020-01-16 ENCOUNTER — Encounter (INDEPENDENT_AMBULATORY_CARE_PROVIDER_SITE_OTHER): Payer: Self-pay | Admitting: Primary Care

## 2020-01-27 ENCOUNTER — Ambulatory Visit (INDEPENDENT_AMBULATORY_CARE_PROVIDER_SITE_OTHER): Payer: Medicaid Other | Admitting: Licensed Clinical Social Worker

## 2020-01-27 ENCOUNTER — Other Ambulatory Visit: Payer: Self-pay

## 2020-01-27 DIAGNOSIS — F4322 Adjustment disorder with anxiety: Secondary | ICD-10-CM
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
## 2020-02-10 ENCOUNTER — Other Ambulatory Visit: Payer: Self-pay

## 2020-02-10 ENCOUNTER — Ambulatory Visit (HOSPITAL_COMMUNITY)
Admission: EM | Admit: 2020-02-10 | Discharge: 2020-02-10 | Disposition: A | Discharge status: Home / Self Care | Payer: Medicaid Other | Attending: Internal Medicine | Admitting: Internal Medicine

## 2020-02-10 DIAGNOSIS — Z20822 Contact with and (suspected) exposure to covid-19: Secondary | ICD-10-CM

## 2020-02-10 NOTE — Discharge Instructions (Signed)
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
## 2020-02-11 LAB — SARS CORONAVIRUS 2 (TAT 6-24 HRS): SARS Coronavirus 2: NEGATIVE

## 2020-04-01 ENCOUNTER — Telehealth: Payer: Self-pay | Admitting: Primary Care

## 2020-04-01 NOTE — Telephone Encounter (Signed)
Copied from CRM [id number]. Topic: General - Other >> Apr 01, 2020  9:57 AM Leslie Yates wrote: Reason for CRM: Pt stated she was diagnosed with bacterial vaginitis in July but she never got the medication that was prescribed. Pt stated she went to the pharmacy but she was told they could not fill the prescription. Pt requests call back

## 2020-04-04 NOTE — Telephone Encounter (Signed)
Left message asking patient to return call to [facility] at [phone].  

## 2020-04-06 NOTE — Telephone Encounter (Signed)
Left message asking patient to return call to [facility] at [phone].

## 2020-04-08 ENCOUNTER — Other Ambulatory Visit (HOSPITAL_COMMUNITY)
Admission: RE | Admit: 2020-04-08 | Discharge: 2020-04-08 | Disposition: A | Discharge status: Home / Self Care | Payer: Medicaid Other | Source: Ambulatory Visit | Attending: Primary Care | Admitting: Primary Care

## 2020-04-08 ENCOUNTER — Ambulatory Visit (INDEPENDENT_AMBULATORY_CARE_PROVIDER_SITE_OTHER): Payer: Medicaid Other | Admitting: Primary Care

## 2020-04-08 ENCOUNTER — Encounter (INDEPENDENT_AMBULATORY_CARE_PROVIDER_SITE_OTHER): Payer: Self-pay | Admitting: Primary Care

## 2020-04-08 ENCOUNTER — Other Ambulatory Visit: Payer: Self-pay

## 2020-04-08 VITALS — BP 101/65 | HR 59 | Temp 97.3°F | Ht 73.0 in | Wt 254.2 lb

## 2020-04-08 DIAGNOSIS — N912 Amenorrhea, unspecified: Secondary | ICD-10-CM

## 2020-04-08 DIAGNOSIS — M79602 Pain in left arm: Secondary | ICD-10-CM

## 2020-04-08 DIAGNOSIS — N898 Other specified noninflammatory disorders of vagina: Secondary | ICD-10-CM | POA: Diagnosis not present

## 2020-04-08 DIAGNOSIS — Z0130 Encounter for examination of blood pressure without abnormal findings: Secondary | ICD-10-CM | POA: Diagnosis not present

## 2020-04-08 LAB — POCT URINE PREGNANCY: Preg Test, Ur: NEGATIVE

## 2020-04-08 NOTE — Telephone Encounter (Signed)
Patient came to the office yesterday denied refill appointment given seen today. Tx per labs

## 2020-04-08 NOTE — Patient Instructions (Signed)

## 2020-04-08 NOTE — Progress Notes (Signed)
Established Patient Office Visit  Subjective:  Patient ID: Leslie Yates, female    DOB: 04/27/1991  Age: 29 y.o. MRN: [id number]  CC:  Chief Complaint  Patient presents with   bacterial vaginitis    Blood Pressure Check    HPI Leslie Yates is a 29 year old female that presented yesterday requesting to send in medication for BV which Leslie Yates never pick up in July. Refused Leslie Yates was given an appointment today Leslie Yates presents for Bp check and vaginal discharge.   Past Medical History:  Diagnosis Date   Asthma    Obesity    Ovarian cyst    Sickle cell trait (HCC)     Past Surgical History:  Procedure Laterality Date   INCISION AND DRAINAGE ABSCESS      Family History  Problem Relation Age of Onset   Diabetes Mother    Hypertension Mother    Diabetes Father    Hypertension Father       Outpatient Medications Prior to Visit  Medication Sig Dispense Refill   ascorbic acid (VITAMIN C) 500 MG tablet Take 1 tablet (500 mg total) by mouth every other day. Take with Iron tablet. 30 tablet 2   ondansetron (ZOFRAN ODT) 4 MG disintegrating tablet Take 1-2 tablets (4-8 mg total) by mouth every 6 (six) hours as needed for nausea or vomiting. For nausea and vomiting not relieved by Phenergan 30 tablet 2   promethazine (PHENERGAN) 25 MG tablet Take 1 tablet (25 mg total) by mouth every 6 (six) hours as needed for nausea or vomiting. 30 tablet 0   scopolamine (TRANSDERM-SCOP, 1.5 MG,) 1 MG/3DAYS Place 1 patch (1.5 mg total) onto the skin every 3 (three) days. 10 patch 12   acetaminophen (TYLENOL) 325 MG tablet Take 650 mg by mouth every 6 (six) hours as needed for mild pain or headache.     ASPIRIN 81 PO Take by mouth.     Blood Pressure Monitoring (BLOOD PRESSURE MONITOR AUTOMAT) DEVI 1 Device by Does not apply route daily. Automatic blood pressure cuff regular/x-large size cuff. To monitor blood pressure regularly at home. ICD-10 code: O09.90. 1 each 0   Ferrous Sulfate  (IRON) 325 (65 Fe) MG TABS Take 1 tablet (325 mg total) by mouth every other day. Take with Vitamin C tablet. (Patient not taking: Reported on 04/08/2020) 30 tablet 2   hydrocortisone-pramoxine (PROCTOFOAM-HC) rectal foam Place 1 applicator rectally 2 (two) times daily. 10 g 6   benzocaine-Menthol (DERMOPLAST) 20-0.5 % AERO Apply 1 application topically as needed for irritation (perineal discomfort).     ibuprofen (ADVIL) 600 MG tablet Take 1 tablet (600 mg total) by mouth every 6 (six) hours as needed. 30 tablet 0   metroNIDAZOLE (FLAGYL) 500 MG tablet Take 1 tablet (500 mg total) by mouth 2 (two) times daily. 14 tablet 0   Misc. Devices (GOJJI WEIGHT SCALE) MISC 1 each by Does not apply route daily as needed. Patient to weight daily as needed. ICD-10 code: O53.90 (Patient not taking: Reported on 01/12/2020) 1 each 0   Norgestimate-Ethinyl Estradiol Triphasic 0.18/0.215/0.25 MG-25 MCG tab Take 1 tablet by mouth daily. (Patient not taking: Reported on 01/12/2020) 30 tablet 12   Prenatal Vit-DSS-Fe Fum-FA (PRENATAL 19) 29-1 MG TABS Take 1 tablet by mouth daily. 30 tablet 12   terconazole (TERAZOL 7) 0.4 % vaginal cream Place 1 applicator vaginally at bedtime. (Patient not taking: Reported on 12/01/2019) 45 g 0   No facility-administered medications prior to visit.  Allergies  Allergen Reactions   Penicillins Rash    Childhood rash Has patient had a PCN reaction causing immediate rash, facial/tongue/throat swelling, SOB or lightheadedness with hypotension: no Has patient had a PCN reaction causing severe rash involving mucus membranes or skin necrosis:no Has patient had a PCN reaction that required hospitalization no Has patient had a PCN reaction occurring within the last 10 years: no If all of the above answers are "NO", then may proceed with Cephalosporin use.     ROS Review of Systems  Musculoskeletal: Positive for back pain.       Carrying 4 month baby and baby carriage Left  arm pain weakness    All other systems reviewed and are negative.     Objective:    Physical Exam Vitals reviewed.  Constitutional:      Appearance: Leslie Yates is obese.  HENT:     Head: Normocephalic.     Ears:     Comments: Bilateral blood in ear canal uses Q tips (STOP)     Nose: Nose normal.  Eyes:     Extraocular Movements: Extraocular movements intact.  Cardiovascular:     Rate and Rhythm: Normal rate and regular rhythm.  Pulmonary:     Effort: Pulmonary effort is normal.     Breath sounds: Normal breath sounds.  Abdominal:     General: Bowel sounds are normal.  Musculoskeletal:     Cervical back: Normal range of motion and neck supple.     Comments: Unable to lift left arm > 30 degrees with out using the other arm for assistance  Skin:    General: Skin is warm and dry.  Neurological:     Mental Status: Leslie Yates is alert and oriented to person, place, and time.  Psychiatric:        Mood and Affect: Mood normal.        Behavior: Behavior normal.        Thought Content: Thought content normal.        Judgment: Judgment normal.     BP 101/65 (BP Location: Left Arm, Patient Position: Sitting, Cuff Size: Large)    Pulse (!) 59    Temp (!) 97.3 F (36.3 C) (Temporal)    Ht 6\' 1"  (1.854 m)    Wt 254 lb 3.2 oz (115.3 kg)    LMP 02/28/2020 (Approximate)    SpO2 98%    Breastfeeding No    BMI 33.54 kg/m  Wt Readings from Last 3 Encounters:  04/08/20 254 lb 3.2 oz (115.3 kg)  01/12/20 245 lb (111.1 kg)  12/24/19 233 lb (105.7 kg)     Health Maintenance Due  Topic Date Due   COVID-19 Vaccine (1) Never done    There are no preventive care reminders to display for this patient.  No results found for: TSH Lab Results  Component Value Date   WBC 6.1 12/01/2019   HGB 9.8 (L) 12/01/2019   HCT 28.1 (L) 12/01/2019   MCV 91 12/01/2019   PLT 295 12/01/2019   Lab Results  Component Value Date   NA 142 12/01/2019   K 4.0 12/01/2019   CO2 23 12/01/2019   GLUCOSE 89 12/01/2019    BUN 7 12/01/2019   CREATININE 0.77 12/01/2019   BILITOT 0.4 12/01/2019   ALKPHOS 45 (L) 12/01/2019   AST 13 12/01/2019   ALT 17 12/01/2019   PROT 6.0 12/01/2019   ALBUMIN 3.6 (L) 12/01/2019   CALCIUM 8.9 12/01/2019   ANIONGAP 9 11/23/2019  No results found for: CHOL No results found for: HDL No results found for: LDLCALC No results found for: TRIG No results found for: [facility] Lab Results  Component Value Date   HGBA1C 5.0 06/12/2019      Assessment & Plan:  Kolbie was seen today for bacterial vaginitis  and blood pressure check.  Diagnoses and all orders for this visit:  Vaginal discharge -     Cervicovaginal ancillary only  Amenorrhea -     POCT urine pregnancy( negative)  Blood pressure check Bp is unremarkable counseled on sodium intake and healthy snacking.  Left arm pain Unable to lift left arm > 30 degrees with out using the other arm for assistance Refer to orthopedist   No orders of the defined types were placed in this encounter.   Follow-up: Return in about 2 weeks (around 04/22/2020) for follow up on  possible folliculitis .    [name], NP

## 2020-04-12 ENCOUNTER — Encounter (INDEPENDENT_AMBULATORY_CARE_PROVIDER_SITE_OTHER): Payer: Self-pay | Admitting: Primary Care

## 2020-04-12 ENCOUNTER — Other Ambulatory Visit (INDEPENDENT_AMBULATORY_CARE_PROVIDER_SITE_OTHER): Payer: Self-pay | Admitting: Primary Care

## 2020-04-12 DIAGNOSIS — B9689 Other specified bacterial agents as the cause of diseases classified elsewhere: Secondary | ICD-10-CM

## 2020-04-12 LAB — CERVICOVAGINAL ANCILLARY ONLY
Bacterial Vaginitis (gardnerella): POSITIVE — AB
Candida Glabrata: NEGATIVE
Candida Vaginitis: NEGATIVE
Chlamydia: NEGATIVE
Comment: NEGATIVE
Comment: NEGATIVE
Comment: NEGATIVE
Comment: NEGATIVE
Comment: NEGATIVE
Comment: NORMAL
Neisseria Gonorrhea: NEGATIVE
Trichomonas: NEGATIVE

## 2020-04-12 MED ORDER — METRONIDAZOLE 500 MG PO TABS: 500.0000 mg | ORAL_TABLET | Freq: Two times a day (BID) | ORAL | 0 refills | Status: DC

## 2020-04-13 ENCOUNTER — Ambulatory Visit (INDEPENDENT_AMBULATORY_CARE_PROVIDER_SITE_OTHER): Payer: Medicaid Other | Admitting: Primary Care

## 2020-04-22 ENCOUNTER — Ambulatory Visit (INDEPENDENT_AMBULATORY_CARE_PROVIDER_SITE_OTHER): Payer: Medicaid Other | Admitting: Primary Care

## 2020-04-22 ENCOUNTER — Other Ambulatory Visit: Payer: Self-pay

## 2020-04-22 ENCOUNTER — Encounter (INDEPENDENT_AMBULATORY_CARE_PROVIDER_SITE_OTHER): Payer: Self-pay | Admitting: Primary Care

## 2020-04-22 VITALS — BP 114/76 | HR 63 | Temp 97.3°F | Ht 73.0 in | Wt 255.2 lb

## 2020-04-22 DIAGNOSIS — N8300 Follicular cyst of ovary, unspecified side: Secondary | ICD-10-CM

## 2020-04-22 DIAGNOSIS — J45909 Unspecified asthma, uncomplicated: Secondary | ICD-10-CM

## 2020-04-22 DIAGNOSIS — R52 Pain, unspecified: Secondary | ICD-10-CM | POA: Diagnosis not present

## 2020-04-22 DIAGNOSIS — N83291 Other ovarian cyst, right side: Secondary | ICD-10-CM | POA: Diagnosis not present

## 2020-04-22 MED ORDER — IBUPROFEN 600 MG PO TABS: 600.0000 mg | ORAL_TABLET | Freq: Three times a day (TID) | ORAL | 1 refills | Status: DC | PRN

## 2020-04-22 MED ORDER — DOXYCYCLINE HYCLATE 100 MG PO TABS: 100.0000 mg | ORAL_TABLET | Freq: Two times a day (BID) | ORAL | 0 refills | Status: DC

## 2020-04-22 MED ORDER — ALBUTEROL SULFATE HFA 108 (90 BASE) MCG/ACT IN AERS: 2.0000 | INHALATION_SPRAY | Freq: Four times a day (QID) | RESPIRATORY_TRACT | 1 refills | Status: AC | PRN

## 2020-04-22 MED ORDER — FLOVENT HFA 110 MCG/ACT IN AERO: 2.0000 | INHALATION_SPRAY | Freq: Two times a day (BID) | RESPIRATORY_TRACT | 12 refills | Status: DC

## 2020-04-22 NOTE — Progress Notes (Signed)
Established Patient Office Visit  Subjective:  Patient ID: Leslie Yates, female    DOB: Nov 10, 1990  Age: 29 y.o. MRN: [id number]  CC:  Chief Complaint  Patient presents with  . folliculitis    HPI Ms. Leslie Yates presents for concerns with pubic hair bumps.  Past Medical History:  Diagnosis Date  . Asthma   . Obesity   . Ovarian cyst   . Sickle cell trait [facility])     Past Surgical History:  Procedure Laterality Date  . INCISION AND DRAINAGE ABSCESS      Family History  Problem Relation Age of Onset  . Diabetes Mother   . Hypertension Mother   . Diabetes Father   . Hypertension Father       Outpatient Medications Prior to Visit  Medication Sig Dispense Refill  . acetaminophen (TYLENOL) 325 MG tablet Take 650 mg by mouth every 6 (six) hours as needed for mild pain or headache.    . ASPIRIN 81 PO Take by mouth.    . Blood Pressure Monitoring (BLOOD PRESSURE MONITOR AUTOMAT) DEVI 1 Device by Does not apply route daily. Automatic blood pressure cuff regular/x-large size cuff. To monitor blood pressure regularly at home. ICD-10 code: O09.90. 1 each 0  . hydrocortisone-pramoxine (PROCTOFOAM-HC) rectal foam Place 1 applicator rectally 2 (two) times daily. 10 g 6  . scopolamine (TRANSDERM-SCOP, 1.5 MG,) 1 MG/3DAYS Place 1 patch (1.5 mg total) onto the skin every 3 (three) days. 10 patch 12  . ascorbic acid (VITAMIN C) 500 MG tablet Take 1 tablet (500 mg total) by mouth every other day. Take with Iron tablet. 30 tablet 2  . Ferrous Sulfate (IRON) 325 (65 Fe) MG TABS Take 1 tablet (325 mg total) by mouth every other day. Take with Vitamin C tablet. (Patient not taking: Reported on 04/08/2020) 30 tablet 2  . metroNIDAZOLE (FLAGYL) 500 MG tablet Take 1 tablet (500 mg total) by mouth 2 (two) times daily. 14 tablet 0  . ondansetron (ZOFRAN ODT) 4 MG disintegrating tablet Take 1-2 tablets (4-8 mg total) by mouth every 6 (six) hours as needed for nausea or vomiting. For nausea and  vomiting not relieved by Phenergan 30 tablet 2  . promethazine (PHENERGAN) 25 MG tablet Take 1 tablet (25 mg total) by mouth every 6 (six) hours as needed for nausea or vomiting. 30 tablet 0   No facility-administered medications prior to visit.    Allergies  Allergen Reactions  . Penicillins Rash    Childhood rash Has patient had a PCN reaction causing immediate rash, facial/tongue/throat swelling, SOB or lightheadedness with hypotension: no Has patient had a PCN reaction causing severe rash involving mucus membranes or skin necrosis:no Has patient had a PCN reaction that required hospitalization no Has patient had a PCN reaction occurring within the last 10 years: no If all of the above answers are "NO", then may proceed with Cephalosporin use.     ROS Review of Systems  Skin: Positive for rash.  All other systems reviewed and are negative.     Objective:    Physical Exam CONSTITUTIONAL: Well-developed, well-nourished obese female in no acute distress.  HENT:  Normocephalic, atraumatic, External right and left ear normal. EYES: Conjunctivae and EOM are normal. Pupils are equal, round, and reactive to light. No scleral icterus.  NECK: Normal range of motion, supple, no masses.  Normal thyroid.  SKIN: Skin is warm and dry. No rash noted. Not diaphoretic. No erythema. No pallor. NEUROLGIC: Alert and  oriented to person, place, and time. Normal reflexes, muscle tone coordination. PSYCHIATRIC: Normal mood and affect. Normal behavior. Normal judgment and thought content. CARDIOVASCULAR: Normal heart rate noted, regular rhythm RESPIRATORY: wheezing LL ant RUL  ABDOMEN: Soft, normal bowel sounds, no distention noted.  No tenderness, rebound or guarding.  external genitalia; palpable masses MUSCULOSKELETAL: Normal range of motion. No tenderness.  No cyanosis, clubbing, or edema.  2+ distal pulses.  There are no preventive care reminders to display for this patient.  No results  found for: TSH Lab Results  Component Value Date   WBC 6.1 12/01/2019   HGB 9.8 (L) 12/01/2019   HCT 28.1 (L) 12/01/2019   MCV 91 12/01/2019   PLT 295 12/01/2019   Lab Results  Component Value Date   NA 142 12/01/2019   K 4.0 12/01/2019   CO2 23 12/01/2019   GLUCOSE 89 12/01/2019   BUN 7 12/01/2019   CREATININE 0.77 12/01/2019   BILITOT 0.4 12/01/2019   ALKPHOS 45 (L) 12/01/2019   AST 13 12/01/2019   ALT 17 12/01/2019   PROT 6.0 12/01/2019   ALBUMIN 3.6 (L) 12/01/2019   CALCIUM 8.9 12/01/2019   ANIONGAP 9 11/23/2019   No results found for: CHOL No results found for: HDL No results found for: LDLCALC No results found for: TRIG No results found for: [facility] Lab Results  Component Value Date   HGBA1C 5.0 06/12/2019      Assessment & Plan:  Leslie Yates was seen today for folliculitis.  Diagnoses and all orders for this visit:  Uncomplicated asthma, unspecified asthma severity, unspecified whether persistent -     albuterol (VENTOLIN HFA) 108 (90 Base) MCG/ACT inhaler; Inhale 2 puffs into the lungs every 6 (six) hours as needed for wheezing or shortness of breath. -     fluticasone (FLOVENT HFA) 110 MCG/ACT inhaler; Inhale 2 puffs into the lungs in the morning and at bedtime.  Complex cyst of right ovary -     Ambulatory referral to Gynecology  Pain -     ibuprofen (ADVIL) 600 MG tablet; Take 1 tablet (600 mg total) by mouth every 8 (eight) hours as needed.  Follicle cyst -     doxycycline (VIBRA-TABS) 100 MG tablet; Take 1 tablet (100 mg total) by mouth 2 (two) times daily.    Meds ordered this encounter  Medications  . albuterol (VENTOLIN HFA) 108 (90 Base) MCG/ACT inhaler    Sig: Inhale 2 puffs into the lungs every 6 (six) hours as needed for wheezing or shortness of breath.    Dispense:  18 g    Refill:  1  . fluticasone (FLOVENT HFA) 110 MCG/ACT inhaler    Sig: Inhale 2 puffs into the lungs in the morning and at bedtime.    Dispense:  12 g    Refill:  12   . ibuprofen (ADVIL) 600 MG tablet    Sig: Take 1 tablet (600 mg total) by mouth every 8 (eight) hours as needed.    Dispense:  90 tablet    Refill:  1  . doxycycline (VIBRA-TABS) 100 MG tablet    Sig: Take 1 tablet (100 mg total) by mouth 2 (two) times daily.    Dispense:  20 tablet    Refill:  0    Follow-up: Return if symptoms worsen or fail to improve.    [name], NP

## 2020-04-22 NOTE — Patient Instructions (Signed)

## 2020-05-06 ENCOUNTER — Ambulatory Visit (INDEPENDENT_AMBULATORY_CARE_PROVIDER_SITE_OTHER): Payer: Medicaid Other | Admitting: Primary Care

## 2020-05-06 ENCOUNTER — Other Ambulatory Visit: Payer: Self-pay

## 2020-05-06 ENCOUNTER — Encounter (INDEPENDENT_AMBULATORY_CARE_PROVIDER_SITE_OTHER): Payer: Self-pay | Admitting: Primary Care

## 2020-05-06 VITALS — BP 122/70 | HR 64 | Temp 97.5°F | Ht 73.0 in | Wt 258.2 lb

## 2020-05-06 DIAGNOSIS — N8300 Follicular cyst of ovary, unspecified side: Secondary | ICD-10-CM

## 2020-05-06 DIAGNOSIS — H6123 Impacted cerumen, bilateral: Secondary | ICD-10-CM

## 2020-05-06 NOTE — Progress Notes (Addendum)
Established Patient Office Visit  Subjective:  Patient ID: Leslie Yates, female    DOB: 07-26-1990  Age: 29 y.o. MRN: [id number]  CC:  Chief Complaint  Patient presents with  . treatment follow up    HPI Ms. CHARDONAY SCRITCHFIELD is a 29 year old female who presents for Follicle cyst previously placed on doxy. As long as she was taking abt's no flare up. When completed course of abt's cyst flare ups returned.  Past Medical History:  Diagnosis Date  . Asthma   . Obesity   . Ovarian cyst   . Sickle cell trait [facility])     Past Surgical History:  Procedure Laterality Date  . INCISION AND DRAINAGE ABSCESS      Family History  Problem Relation Age of Onset  . Diabetes Mother   . Hypertension Mother   . Diabetes Father   . Hypertension Father    Outpatient Medications Prior to Visit  Medication Sig Dispense Refill  . acetaminophen (TYLENOL) 325 MG tablet Take 650 mg by mouth every 6 (six) hours as needed for mild pain or headache.    . albuterol (VENTOLIN HFA) 108 (90 Base) MCG/ACT inhaler Inhale 2 puffs into the lungs every 6 (six) hours as needed for wheezing or shortness of breath. 18 g 1  . ASPIRIN 81 PO Take by mouth.    . Blood Pressure Monitoring (BLOOD PRESSURE MONITOR AUTOMAT) DEVI 1 Device by Does not apply route daily. Automatic blood pressure cuff regular/x-large size cuff. To monitor blood pressure regularly at home. ICD-10 code: O09.90. 1 each 0  . fluticasone (FLOVENT HFA) 110 MCG/ACT inhaler Inhale 2 puffs into the lungs in the morning and at bedtime. 12 g 12  . hydrocortisone-pramoxine (PROCTOFOAM-HC) rectal foam Place 1 applicator rectally 2 (two) times daily. 10 g 6  . ibuprofen (ADVIL) 600 MG tablet Take 1 tablet (600 mg total) by mouth every 8 (eight) hours as needed. 90 tablet 1  . scopolamine (TRANSDERM-SCOP, 1.5 MG,) 1 MG/3DAYS Place 1 patch (1.5 mg total) onto the skin every 3 (three) days. 10 patch 12  . doxycycline (VIBRA-TABS) 100 MG tablet Take 1 tablet (100  mg total) by mouth 2 (two) times daily. 20 tablet 0   No facility-administered medications prior to visit.    Allergies  Allergen Reactions  . Penicillins Rash    Childhood rash Has patient had a PCN reaction causing immediate rash, facial/tongue/throat swelling, SOB or lightheadedness with hypotension: no Has patient had a PCN reaction causing severe rash involving mucus membranes or skin necrosis:no Has patient had a PCN reaction that required hospitalization no Has patient had a PCN reaction occurring within the last 10 years: no If all of the above answers are "NO", then may proceed with Cephalosporin use.     ROS Review of Systems  Skin:       Follicle cyst  All other systems reviewed and are negative.     Objective:    Physical Exam Vitals reviewed.  Constitutional:      Appearance: She is obese.  HENT:     Right Ear: There is impacted cerumen.     Left Ear: There is impacted cerumen.     Nose: Nose normal.  Cardiovascular:     Rate and Rhythm: Normal rate and regular rhythm.  Pulmonary:     Effort: Pulmonary effort is normal.     Breath sounds: Normal breath sounds.  Abdominal:     General: Bowel sounds are normal.  There is distension.     Palpations: Abdomen is soft.  Musculoskeletal:        General: Normal range of motion.     Cervical back: Normal range of motion and neck supple.  Skin:    General: Skin is warm and dry.  Neurological:     Mental Status: She is alert and oriented to person, place, and time.  Psychiatric:        Mood and Affect: Mood normal.        Behavior: Behavior normal.        Thought Content: Thought content normal.        Judgment: Judgment normal.     BP 122/70 (BP Location: Right Arm, Patient Position: Sitting, Cuff Size: Normal)   Pulse 64   Temp (!) 97.5 F (36.4 C) (Temporal)   Ht 6\' 1"  (1.854 m)   Wt 258 lb 3.2 oz (117.1 kg)   SpO2 97%   Breastfeeding No   BMI 34.07 kg/m  Wt Readings from Last 3 Encounters:   05/06/20 258 lb 3.2 oz (117.1 kg)  04/22/20 255 lb 3.2 oz (115.8 kg)  04/08/20 254 lb 3.2 oz (115.3 kg)     Health Maintenance Due  Topic Date Due  . COVID-19 Vaccine (1) Never done    There are no preventive care reminders to display for this patient.  No results found for: TSH Lab Results  Component Value Date   WBC 6.1 12/01/2019   HGB 9.8 (L) 12/01/2019   HCT 28.1 (L) 12/01/2019   MCV 91 12/01/2019   PLT 295 12/01/2019   Lab Results  Component Value Date   NA 142 12/01/2019   K 4.0 12/01/2019   CO2 23 12/01/2019   GLUCOSE 89 12/01/2019   BUN 7 12/01/2019   CREATININE 0.77 12/01/2019   BILITOT 0.4 12/01/2019   ALKPHOS 45 (L) 12/01/2019   AST 13 12/01/2019   ALT 17 12/01/2019   PROT 6.0 12/01/2019   ALBUMIN 3.6 (L) 12/01/2019   CALCIUM 8.9 12/01/2019   ANIONGAP 9 11/23/2019   No results found for: CHOL No results found for: HDL No results found for: LDLCALC No results found for: TRIG No results found for: [facility] Lab Results  Component Value Date   HGBA1C 5.0 06/12/2019      Assessment & Plan:  Enslee was seen today for treatment follow up.  Diagnoses and all orders for this visit:  Follicle cyst -     Ambulatory referral to Dermatology  Bilateral impacted cerumen Return for ear irrigation   No orders of the defined types were placed in this encounter.   Follow-up: Return in 1 year (on 05/06/2021) for ear irrigation bilateral .    13/05/2021, NP

## 2020-05-06 NOTE — Patient Instructions (Signed)

## 2020-05-18 ENCOUNTER — Ambulatory Visit (INDEPENDENT_AMBULATORY_CARE_PROVIDER_SITE_OTHER): Payer: Medicaid Other | Admitting: Primary Care

## 2020-05-22 IMAGING — CT CT ABDOMEN AND PELVIS WITH CONTRAST
2 of 4 series · 16 of 46 positions shown, 18 images · IV contrast (OMNIPAQUE)
Comparison: CT scan dated 09/27/2016 and MRI of the pelvis dated
05/24/2017

CLINICAL DATA: Abdominal pain and nausea and vomiting.

EXAM:
CT ABDOMEN AND PELVIS WITH CONTRAST
TECHNIQUE: Multidetector CT imaging of the abdomen and pelvis was performed
using the standard protocol following bolus administration of
intravenous contrast.
CONTRAST:  100mL OMNIPAQUE IOHEXOL 300 MG/ML  SOLN

[Series 2: axial st · axial · 0.82mm/px · z∈[+1278,+1694]mm · 13 of 95 slices shown, 15 images]
[im 6/95  soft-tissue]
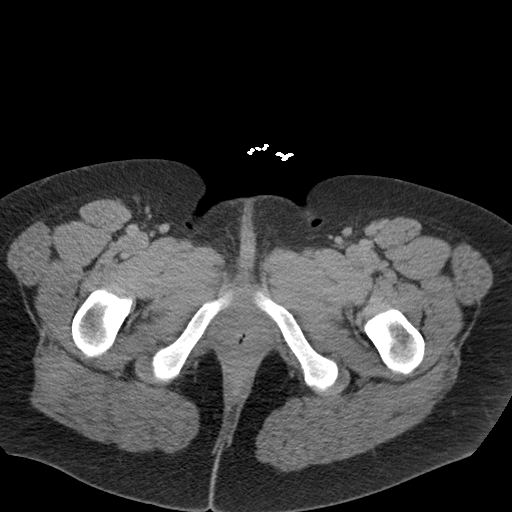
[im 6/95  bone]
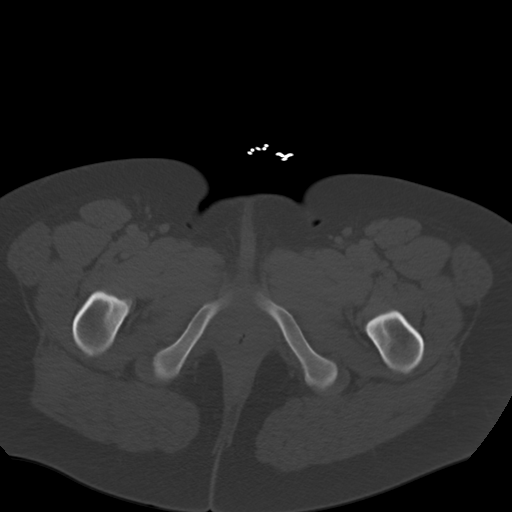
[im 11/95  soft-tissue]
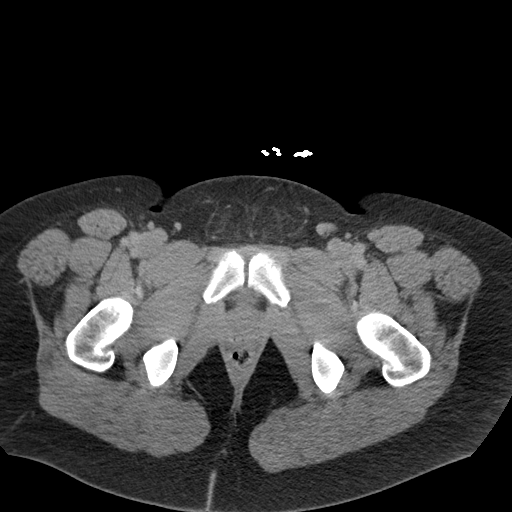
[im 21/95  soft-tissue]
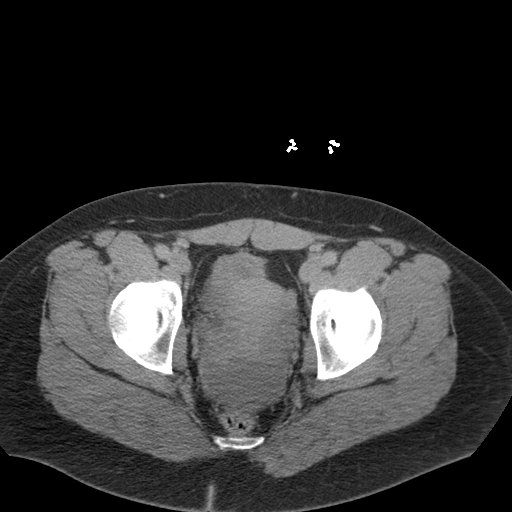
[im 27/95  soft-tissue]
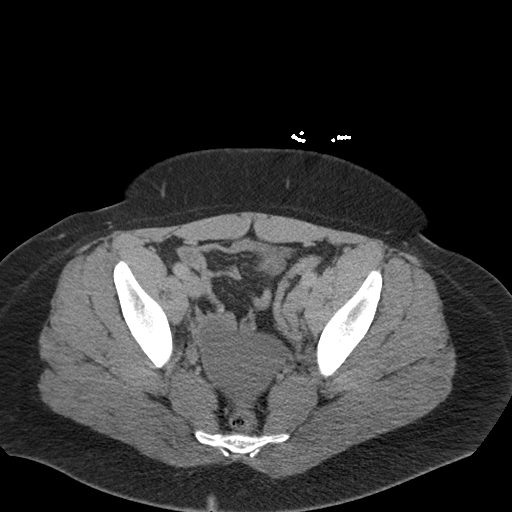
[im 32/95  soft-tissue]
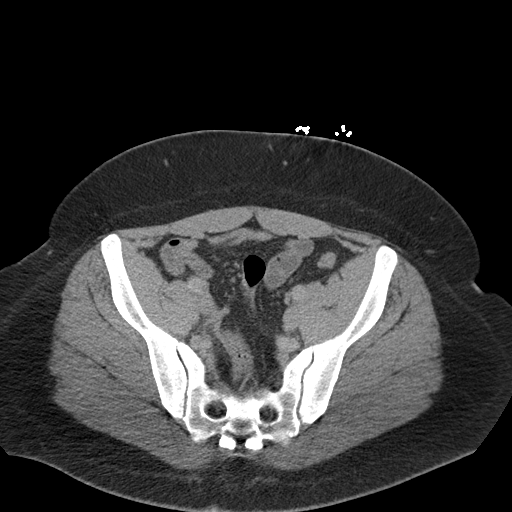
[im 42/95  soft-tissue]
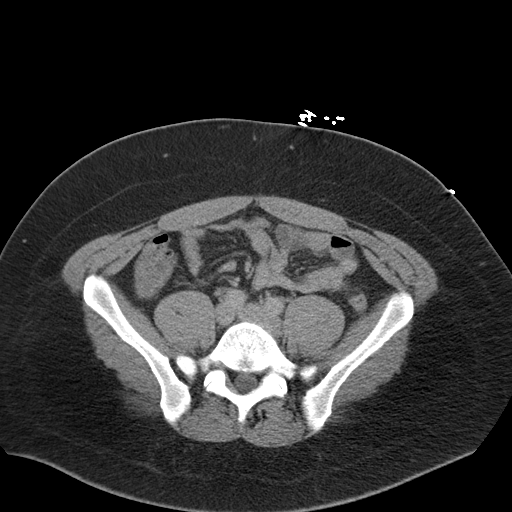
[im 48/95  soft-tissue]
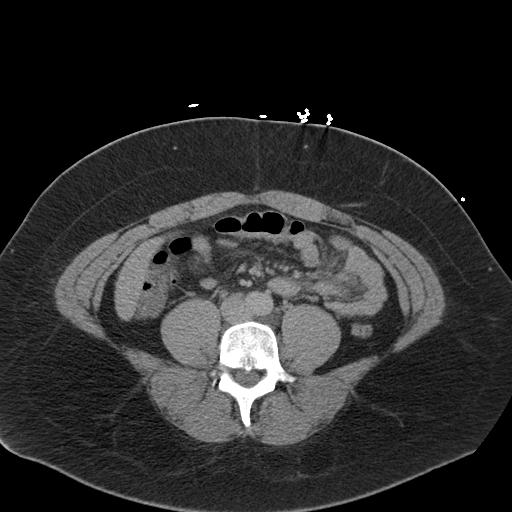
[im 53/95  soft-tissue]
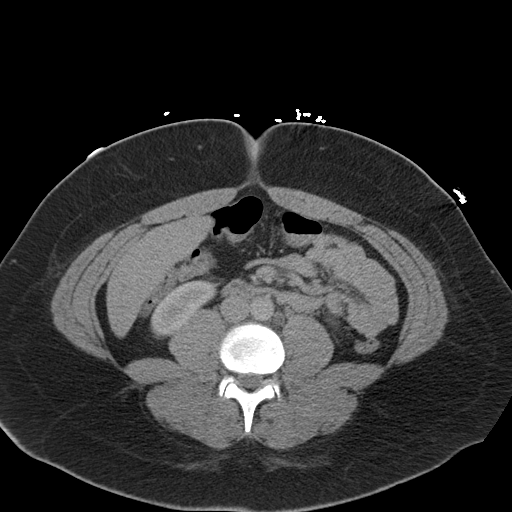
[im 63/95  soft-tissue]
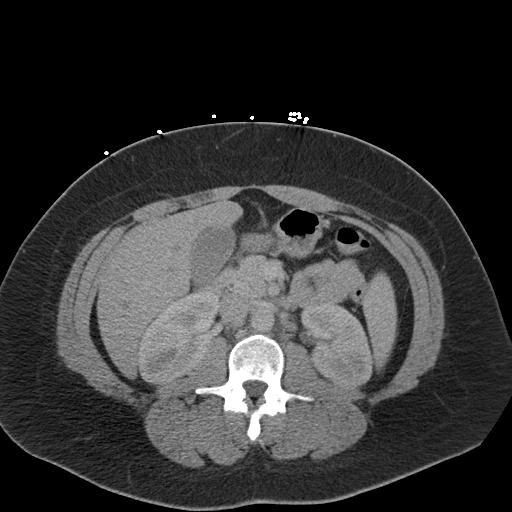
[im 63/95  bone]
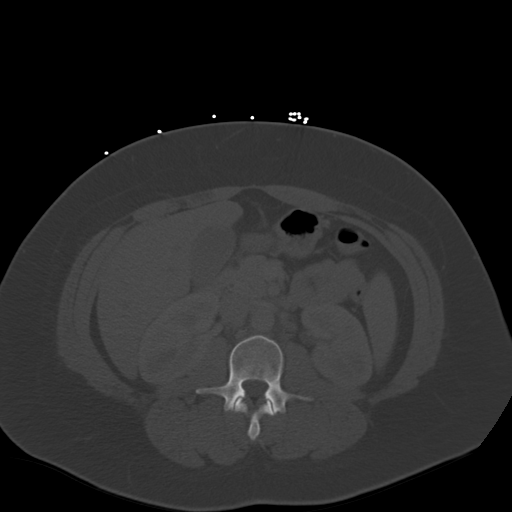
[im 68/95  soft-tissue]
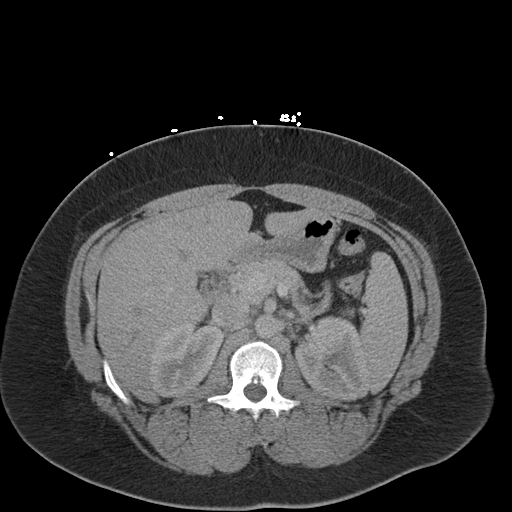
[im 74/95  soft-tissue]
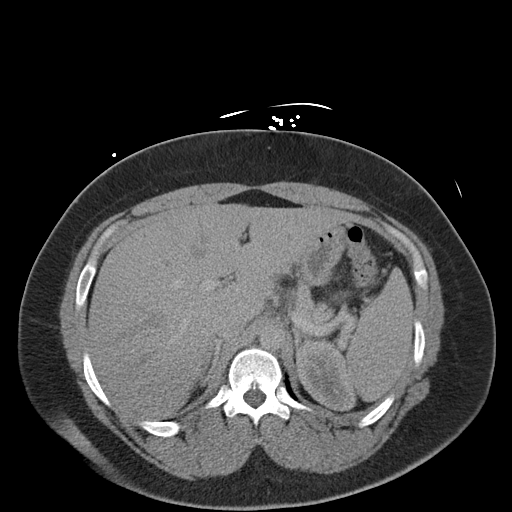
[im 84/95  soft-tissue]
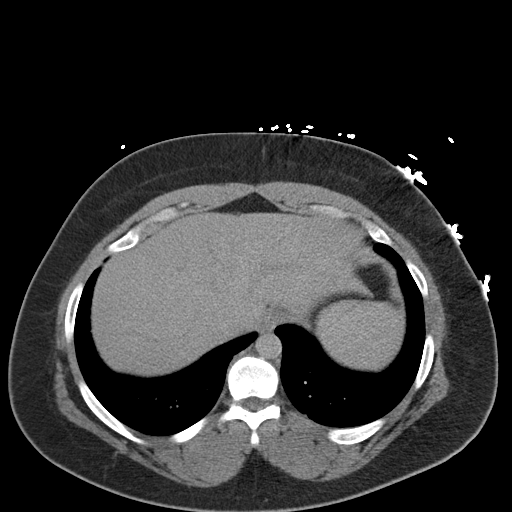
[im 89/95  soft-tissue]
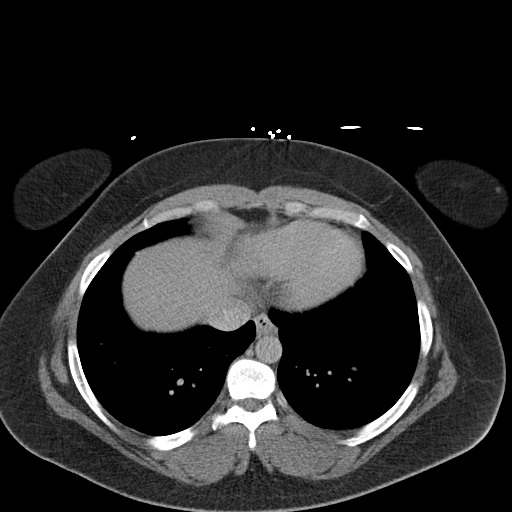

[Series 4: coronal st · coronal · 0.99mm/px · 3 of 122 slices shown]
[im 41/122  soft-tissue]
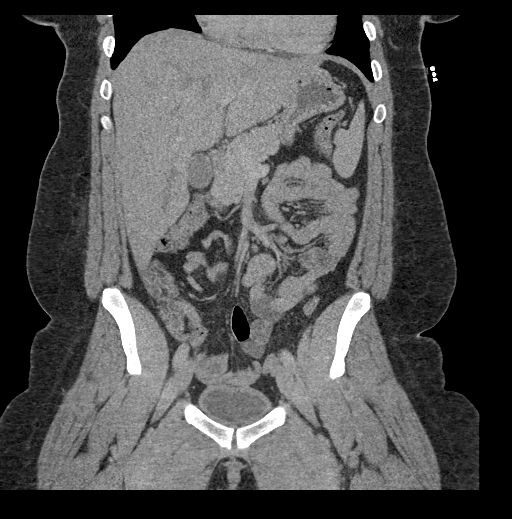
[im 54/122  soft-tissue]
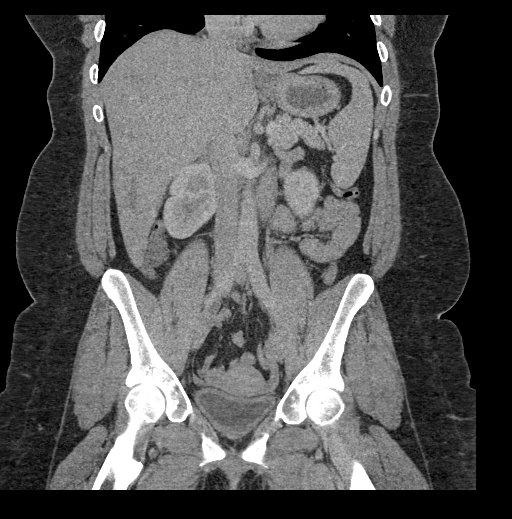
[im 68/122  soft-tissue]
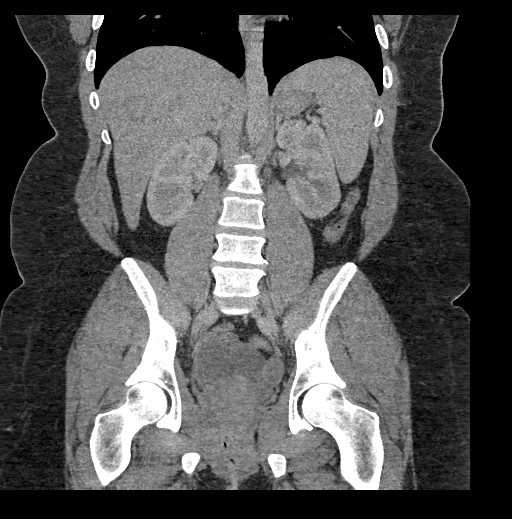

[16 of 46 positions shown; findings below may reference images not displayed]

FINDINGS: Lower chest: Negative.

Hepatobiliary: The liver appears normal. No dilated bile ducts.
There is a appearance of a small amount of edema in the anterior
aspect of the wall of the gallbladder versus a small amount of
pericholecystic fluid. However, this is unchanged since the prior
study 09/27/2016.

Pancreas: Unremarkable. No pancreatic ductal dilatation or
surrounding inflammatory changes.

Spleen: Normal in size without focal abnormality.

Adrenals/Urinary Tract: Adrenal glands are unremarkable. Kidneys are
normal, without renal calculi, focal lesion, or hydronephrosis.
Bladder is unremarkable.

Stomach/Bowel: Stomach is within normal limits. Appendix appears
normal. No evidence of bowel wall thickening, distention, or
inflammatory changes. No evidence of diverticulitis.

Vascular/Lymphatic: No significant vascular findings are present. No
enlarged abdominal or pelvic lymph nodes.

Reproductive: The uterus and left ovary appear normal. There is a 9
cm cyst lying in the pelvic cul-de-sac. This may originate from the
right ovary. This appears slightly less prominent than on the prior
pelvic MRI.

Other: No abdominal wall hernia or abnormality. No abdominopelvic
ascites. Abdominal wall hernia.

Musculoskeletal: No acute or significant osseous findings.
IMPRESSION: 1. No acute abnormalities of the abdomen or pelvis.
2. Chronic simple appearing cyst arising from the right ovary.
3. Slight chronic fluid in the gallbladder wall, nonspecific. This
is unchanged since [DATE].

## 2020-05-23 ENCOUNTER — Encounter (INDEPENDENT_AMBULATORY_CARE_PROVIDER_SITE_OTHER): Payer: Self-pay | Admitting: Primary Care

## 2020-05-23 ENCOUNTER — Ambulatory Visit (INDEPENDENT_AMBULATORY_CARE_PROVIDER_SITE_OTHER): Payer: Medicaid Other | Admitting: *Deleted

## 2020-05-23 ENCOUNTER — Ambulatory Visit: Payer: Medicaid Other

## 2020-05-23 ENCOUNTER — Other Ambulatory Visit: Payer: Self-pay

## 2020-05-23 VITALS — BP 123/76 | HR 69 | Temp 97.7°F | Ht 73.0 in | Wt 257.6 lb

## 2020-05-23 DIAGNOSIS — Z3A00 Weeks of gestation of pregnancy not specified: Secondary | ICD-10-CM | POA: Diagnosis not present

## 2020-05-23 DIAGNOSIS — Z3201 Encounter for pregnancy test, result positive: Secondary | ICD-10-CM | POA: Diagnosis not present

## 2020-05-23 DIAGNOSIS — O0990 Supervision of high risk pregnancy, unspecified, unspecified trimester: Secondary | ICD-10-CM | POA: Insufficient documentation

## 2020-05-23 LAB — POCT URINE PREGNANCY: Preg Test, Ur: POSITIVE — AB

## 2020-05-23 MED ORDER — PRENATAL 27-1 MG PO TABS: 1.0000 | ORAL_TABLET | Freq: Every day | ORAL | 12 refills | Status: DC

## 2020-05-23 NOTE — Progress Notes (Signed)
   Location: [facility] Patient: Leslie Yates MRN: [id number] Provider: Clovis Pu, RN  PRENATAL INTAKE SUMMARY  Ms. Frier presents today New OB Nurse Interview.  OB History    Gravida  2   Para  1   Term  0   Preterm  1   AB  0   Living  1     SAB  0   TAB  0   Ectopic  0   Multiple  0   Live Births  1          I have reviewed the patient's medical, obstetrical, social, and family histories, medications, and available lab results.  SUBJECTIVE She has no unusual complaints  OBJECTIVE Initial Nurse interview for history/labs (New OB)  EDD: 01/13/2021 GA: [redacted]w[redacted]d by LMP [id number] FHT: not assessed due to early dating  GENERAL APPEARANCE: alert, well appearing, in no apparent distress, oriented to person, place and time   ASSESSMENT Positive Pregnancy test Normal pregnancy  PLAN Prenatal care:  [facility] Lab to be completed at next visit with [name], CNM 06/30/2020 Ultrasound ordered to confirm dating/viability Rx PNV sent to pharmacy  Follow Up Instructions:   I discussed the assessment and treatment plan with the patient. The patient was provided an opportunity to ask questions and all were answered. The patient agreed with the plan and demonstrated an understanding of the instructions.   The patient was advised to call back or seek an in-person evaluation if the symptoms worsen or if the condition fails to improve as anticipated.  I provided 20 minutes of  face-to-face time during this encounter.  Clovis Pu, RN

## 2020-05-29 ENCOUNTER — Inpatient Hospital Stay (HOSPITAL_COMMUNITY): Payer: Medicaid Other

## 2020-05-29 ENCOUNTER — Encounter (HOSPITAL_COMMUNITY): Payer: Self-pay | Admitting: Obstetrics and Gynecology

## 2020-05-29 ENCOUNTER — Other Ambulatory Visit: Payer: Self-pay

## 2020-05-29 ENCOUNTER — Ambulatory Visit (HOSPITAL_COMMUNITY)
Admission: EM | Admit: 2020-05-29 | Discharge: 2020-05-29 | Disposition: A | Discharge status: Other Acute Inpatient Hospital | Payer: Medicaid Other

## 2020-05-29 ENCOUNTER — Inpatient Hospital Stay (HOSPITAL_COMMUNITY)
Admission: AD | Admit: 2020-05-29 | Discharge: 2020-05-29 | Disposition: A | Discharge status: Home / Self Care | Payer: Medicaid Other | Attending: Obstetrics and Gynecology | Admitting: Obstetrics and Gynecology

## 2020-05-29 DIAGNOSIS — B373 Candidiasis of vulva and vagina: Secondary | ICD-10-CM | POA: Diagnosis not present

## 2020-05-29 DIAGNOSIS — O99331 Smoking (tobacco) complicating pregnancy, first trimester: Secondary | ICD-10-CM | POA: Diagnosis not present

## 2020-05-29 DIAGNOSIS — R109 Unspecified abdominal pain: Secondary | ICD-10-CM | POA: Insufficient documentation

## 2020-05-29 DIAGNOSIS — O468X1 Other antepartum hemorrhage, first trimester: Secondary | ICD-10-CM | POA: Diagnosis not present

## 2020-05-29 DIAGNOSIS — O99891 Other specified diseases and conditions complicating pregnancy: Secondary | ICD-10-CM

## 2020-05-29 DIAGNOSIS — F17210 Nicotine dependence, cigarettes, uncomplicated: Secondary | ICD-10-CM | POA: Insufficient documentation

## 2020-05-29 DIAGNOSIS — Z79899 Other long term (current) drug therapy: Secondary | ICD-10-CM | POA: Diagnosis not present

## 2020-05-29 DIAGNOSIS — B3731 Acute candidiasis of vulva and vagina: Secondary | ICD-10-CM

## 2020-05-29 DIAGNOSIS — O0990 Supervision of high risk pregnancy, unspecified, unspecified trimester: Secondary | ICD-10-CM

## 2020-05-29 DIAGNOSIS — Z3A01 Less than 8 weeks gestation of pregnancy: Secondary | ICD-10-CM | POA: Diagnosis not present

## 2020-05-29 DIAGNOSIS — Z3491 Encounter for supervision of normal pregnancy, unspecified, first trimester: Secondary | ICD-10-CM

## 2020-05-29 DIAGNOSIS — O26891 Other specified pregnancy related conditions, first trimester: Secondary | ICD-10-CM | POA: Insufficient documentation

## 2020-05-29 DIAGNOSIS — O418X10 Other specified disorders of amniotic fluid and membranes, first trimester, not applicable or unspecified: Secondary | ICD-10-CM

## 2020-05-29 DIAGNOSIS — O208 Other hemorrhage in early pregnancy: Secondary | ICD-10-CM | POA: Insufficient documentation

## 2020-05-29 DIAGNOSIS — O98811 Other maternal infectious and parasitic diseases complicating pregnancy, first trimester: Secondary | ICD-10-CM | POA: Insufficient documentation

## 2020-05-29 DIAGNOSIS — O26899 Other specified pregnancy related conditions, unspecified trimester: Secondary | ICD-10-CM

## 2020-05-29 LAB — CBC
HCT: 33.2 % — ABNORMAL LOW (ref 36.0–46.0)
Hemoglobin: 12 g/dL (ref 12.0–15.0)
MCH: 31.3 pg (ref 26.0–34.0)
MCHC: 36.1 g/dL — ABNORMAL HIGH (ref 30.0–36.0)
MCV: 86.7 fL (ref 80.0–100.0)
Platelets: 193 10*3/uL (ref 150–400)
RBC: 3.83 MIL/uL — ABNORMAL LOW (ref 3.87–5.11)
RDW: 12.2 % (ref 11.5–15.5)
WBC: 7 10*3/uL (ref 4.0–10.5)
nRBC: 0 % (ref 0.0–0.2)

## 2020-05-29 LAB — URINALYSIS, ROUTINE W REFLEX MICROSCOPIC
Bilirubin Urine: NEGATIVE
Glucose, UA: NEGATIVE mg/dL
Hgb urine dipstick: NEGATIVE
Ketones, ur: NEGATIVE mg/dL
Leukocytes,Ua: NEGATIVE
Nitrite: NEGATIVE
Protein, ur: NEGATIVE mg/dL
Specific Gravity, Urine: 1.012 (ref 1.005–1.030)
pH: 6 (ref 5.0–8.0)

## 2020-05-29 LAB — WET PREP, GENITAL
Clue Cells Wet Prep HPF POC: NONE SEEN
Sperm: NONE SEEN
Trich, Wet Prep: NONE SEEN

## 2020-05-29 LAB — HCG, QUANTITATIVE, PREGNANCY: hCG, Beta Chain, Quant, S: 45213 m[IU]/mL — ABNORMAL HIGH (ref <5)

## 2020-05-29 MED ORDER — TERCONAZOLE 0.4 % VA CREA: 1.0000 | TOPICAL_CREAM | Freq: Every day | VAGINAL | 0 refills | Status: DC

## 2020-05-29 NOTE — MAU Note (Signed)
Leslie Yates is a 29 y.o. at [redacted]w[redacted]d here in MAU reporting: started having sharp abdominal pain yesterday. States she is not having pain at this time but she did have some pain earlier. States pain made her cry and shake. No bleeding or discharge.  Onset of complaint: yesterday  Pain score: 0/10  Vitals:   05/29/20 1408  BP: 117/62  Pulse: 87  Resp: 16  Temp: 98.6 F (37 C)  SpO2: 100%     Lab orders placed from triage: UA

## 2020-05-29 NOTE — MAU Provider Note (Signed)
History     CSN: [id number]  Arrival date and time: 05/29/20 1339   First Provider Initiated Contact with Patient 05/29/20 1432      Chief Complaint  Patient presents with  . Abdominal Pain   HPI Leslie Yates is a 29 y.o. G2P0101 at 3179w2d who presents with abdominal pain. She states since last night, she has had sharp shooting abdominal pain that "brought me to my needs." She states the pain has since improved but she was worried about how bad it was and wanted to come in. She denies any vaginal discharge or bleeding.   OB History    Gravida  2   Para  1   Term  0   Preterm  1   AB  0   Living  1     SAB  0   TAB  0   Ectopic  0   Multiple  0   Live Births  1           Past Medical History:  Diagnosis Date  . Asthma   . Obesity   . Ovarian cyst   . Sickle cell trait (HCC)   . Supervision of high risk pregnancy, antepartum 06/01/2019    Nursing Staff Provider Office Location  [facility] Dating   Language  English Anatomy US  Normal  Flu Vaccine   Genetic Screen  NIPS:    AFP: screen negative   TDaP vaccine    Hgb A1C or  GTT Early  Third trimester  Rhogam     LAB RESULTS  Feeding Plan Breast Blood Type O/Positive/-- (12/18 1029)  Contraception Pills Antibody Negative (12/18 1029) Circumcision If boy, yes Rubella 1.74 (12/18 102    Past Surgical History:  Procedure Laterality Date  . INCISION AND DRAINAGE ABSCESS      Family History  Problem Relation Age of Onset  . Diabetes Mother   . Hypertension Mother   . Diabetes Father   . Hypertension Father     Social History   Tobacco Use  . Smoking status: Current Every Day Smoker    Packs/day: 0.50    Years: 11.00    Pack years: 5.50    Types: Cigarettes  . Smokeless tobacco: Never Used  Vaping Use  . Vaping Use: Never used  Substance Use Topics  . Alcohol use: Not Currently    Comment: occ  . Drug use: Not Currently    Types: Benzodiazepines, Marijuana    Comment: States she never fill  prescription, last time marijuana use yesterday    Allergies:  Allergies  Allergen Reactions  . Penicillins Rash    Childhood rash Has patient had a PCN reaction causing immediate rash, facial/tongue/throat swelling, SOB or lightheadedness with hypotension: no Has patient had a PCN reaction causing severe rash involving mucus membranes or skin necrosis:no Has patient had a PCN reaction that required hospitalization no Has patient had a PCN reaction occurring within the last 10 years: no If all of the above answers are "NO", then may proceed with Cephalosporin use.     Medications Prior to Admission  Medication Sig Dispense Refill Last Dose  . acetaminophen (TYLENOL) 325 MG tablet Take 650 mg by mouth every 6 (six) hours as needed for mild pain or headache. (Patient not taking: Reported on 05/23/2020)     . albuterol (VENTOLIN HFA) 108 (90 Base) MCG/ACT inhaler Inhale 2 puffs into the lungs every 6 (six) hours as needed for wheezing or shortness of breath.  18 g 1   . ASPIRIN 81 PO Take by mouth. (Patient not taking: Reported on 05/23/2020)     . Blood Pressure Monitoring (BLOOD PRESSURE MONITOR AUTOMAT) DEVI 1 Device by Does not apply route daily. Automatic blood pressure cuff regular/x-large size cuff. To monitor blood pressure regularly at home. ICD-10 code: O09.90. 1 each 0   . fluticasone (FLOVENT HFA) 110 MCG/ACT inhaler Inhale 2 puffs into the lungs in the morning and at bedtime. 12 g 12   . hydrocortisone-pramoxine (PROCTOFOAM-HC) rectal foam Place 1 applicator rectally 2 (two) times daily. (Patient not taking: Reported on 05/23/2020) 10 g 6   . ibuprofen (ADVIL) 600 MG tablet Take 1 tablet (600 mg total) by mouth every 8 (eight) hours as needed. (Patient not taking: Reported on 05/23/2020) 90 tablet 1   . Prenatal 27-1 MG TABS Take 1 tablet by mouth daily. 30 tablet 12   . scopolamine (TRANSDERM-SCOP, 1.5 MG,) 1 MG/3DAYS Place 1 patch (1.5 mg total) onto the skin every 3 (three)  days. (Patient not taking: Reported on 05/23/2020) 10 patch 12     Review of Systems  Constitutional: Negative.  Negative for fatigue and fever.  HENT: Negative.   Respiratory: Negative.  Negative for shortness of breath.   Cardiovascular: Negative.  Negative for chest pain.  Gastrointestinal: Positive for abdominal pain. Negative for constipation, diarrhea, nausea and vomiting.  Genitourinary: Negative.  Negative for dysuria, vaginal bleeding and vaginal discharge.  Neurological: Negative.  Negative for dizziness and headaches.   Physical Exam   Blood pressure 117/62, pulse 87, temperature 98.6 F (37 C), temperature source Oral, resp. rate 16, height 6\' 1"  (1.854 m), weight 117.9 kg, last menstrual period 04/08/2020, SpO2 100 %, not currently breastfeeding.  Physical Exam Vitals and nursing note reviewed.  Constitutional:      General: She is not in acute distress.    Appearance: She is well-developed.  HENT:     Head: Normocephalic.  Eyes:     Pupils: Pupils are equal, round, and reactive to light.  Cardiovascular:     Rate and Rhythm: Normal rate and regular rhythm.     Heart sounds: Normal heart sounds.  Pulmonary:     Effort: Pulmonary effort is normal. No respiratory distress.     Breath sounds: Normal breath sounds.  Abdominal:     General: Bowel sounds are normal. There is no distension.     Palpations: Abdomen is soft.     Tenderness: There is no abdominal tenderness.  Skin:    General: Skin is warm and dry.  Neurological:     Mental Status: She is alert and oriented to person, place, and time.  Psychiatric:        Behavior: Behavior normal.        Thought Content: Thought content normal.        Judgment: Judgment normal.     MAU Course  Procedures Results for orders placed or performed during the hospital encounter of 05/29/20 (from the past 24 hour(s))  CBC     Status: Abnormal   Collection Time: 05/29/20  2:26 PM  Result Value Ref Range   WBC 7.0 4.0  - 10.5 K/uL   RBC 3.83 (L) 3.87 - 5.11 MIL/uL   Hemoglobin 12.0 12.0 - 15.0 g/dL   HCT 14/05/21 (L) 36 - 46 %   MCV 86.7 80.0 - 100.0 fL   MCH 31.3 26.0 - 34.0 pg   MCHC 36.1 (H) 30.0 - 36.0 g/dL  RDW 12.2 11.5 - 15.5 %   Platelets 193 150 - 400 K/uL   nRBC 0.0 0.0 - 0.2 %  hCG, quantitative, pregnancy     Status: Abnormal   Collection Time: 05/29/20  2:26 PM  Result Value Ref Range   hCG, Beta Chain, Quant, S 45,213 (H) <5 mIU/mL  Urinalysis, Routine w reflex microscopic Urine, Clean Catch     Status: None   Collection Time: 05/29/20  2:31 PM  Result Value Ref Range   Color, Urine YELLOW YELLOW   APPearance CLEAR CLEAR   Specific Gravity, Urine 1.012 1.005 - 1.030   pH 6.0 5.0 - 8.0   Glucose, UA NEGATIVE NEGATIVE mg/dL   Hgb urine dipstick NEGATIVE NEGATIVE   Bilirubin Urine NEGATIVE NEGATIVE   Ketones, ur NEGATIVE NEGATIVE mg/dL   Protein, ur NEGATIVE NEGATIVE mg/dL   Nitrite NEGATIVE NEGATIVE   Leukocytes,Ua NEGATIVE NEGATIVE  Wet prep, genital     Status: Abnormal   Collection Time: 05/29/20  2:45 PM   Specimen: PATH Cytology Cervicovaginal Ancillary Only  Result Value Ref Range   Yeast Wet Prep HPF POC PRESENT (A) NONE SEEN   Trich, Wet Prep NONE SEEN NONE SEEN   Clue Cells Wet Prep HPF POC NONE SEEN NONE SEEN   WBC, Wet Prep HPF POC MANY (A) NONE SEEN   Sperm NONE SEEN      US OB LESS THAN 14 WEEKS WITH OB TRANSVAGINAL  Result Date: 05/29/2020 CLINICAL DATA:  Abdominal pain EXAM: OBSTETRIC <14 WK [place] AND TRANSVAGINAL OB US TECHNIQUE: Both transabdominal and transvaginal ultrasound examinations were performed for complete evaluation of the gestation as well as the maternal uterus, adnexal regions, and pelvic cul-de-sac. Transvaginal technique was performed to assess early pregnancy. COMPARISON:  05/07/2017, 10/28/2018. FINDINGS: Intrauterine gestational sac: Single Yolk sac:  Visualized. Embryo:  Visualized. Cardiac Activity: Visualized. Heart Rate: 116 bpm CRL: 4.8 mm   6  w   1 d                  [place] EDC: 01/21/2021 Subchorionic hemorrhage: There is a small subchorionic hemorrhage along the right superior aspect of the gestational sac, better seen on the transabdominal images. Maternal uterus/adnexae: Left ovary measures 2.8 x 1.4 x 2.3 cm with normal follicles. Normal right ovarian tissue is not identified. There is a large unilocular cyst in the right adnexa measuring 13.1 x 12.2 x 6.7 cm. This has been seen on prior studies dating to 2018. IMPRESSION: 1. Single live intrauterine pregnancy as above estimated age 20 weeks and 1 day. 2. Trace subchorionic hemorrhage. 3. Large simple right adnexal cyst, which is not appreciably different from multiple prior studies dating to 2018. Electronically Signed   By: Sharlet Salina M.D.   On: 05/29/2020 15:41    MDM UA, UPT CBC, HCG ABO/Rh- O Pos Wet prep and gc/chlamydia US OB Comp Less 14 weeks with Transvaginal   Assessment and Plan   1. Normal intrauterine pregnancy on prenatal ultrasound in first trimester   2. Abdominal pain affecting pregnancy   3. Supervision of high risk pregnancy, antepartum   4. Candidiasis of vagina   5. [redacted] weeks gestation of pregnancy   6. Subchorionic hemorrhage of placenta in first trimester, single or unspecified fetus    -Discharge home in stable condition -Rx for terazol sent to patient's pharmacy -First trimester precautions discussed -Patient advised to follow-up with OB as scheduled for prenatal care -Patient may return to MAU as needed or if  her condition were to change or worsen   Rolm Bookbinder CNM 05/29/2020, 3:51 PM

## 2020-05-29 NOTE — Discharge Instructions (Signed)
Safe Medications in Pregnancy  ° °Acne: °Benzoyl Peroxide °Salicylic Acid ° °Backache/Headache: °Tylenol: 2 regular strength every 4 hours OR °             2 Extra strength every 6 hours ° °Colds/Coughs/Allergies: °Benadryl (alcohol free) 25 mg every 6 hours as needed °Breath right strips °Claritin °Cepacol throat lozenges °Chloraseptic throat spray °Cold-Eeze- up to three times per day °Cough drops, alcohol free °Flonase (by prescription only) °Guaifenesin °Mucinex °Robitussin DM (plain only, alcohol free) °Saline nasal spray/drops °Sudafed (pseudoephedrine) & Actifed ** use only after [redacted] weeks gestation and if you do not have high blood pressure °Tylenol °Vicks Vaporub °Zinc lozenges °Zyrtec  ° °Constipation: °Colace °Ducolax suppositories °Fleet enema °Glycerin suppositories °Metamucil °Milk of magnesia °Miralax °Senokot °Smooth move tea ° °Diarrhea: °Kaopectate °Imodium A-D ° °*NO pepto Bismol ° °Hemorrhoids: °Anusol °Anusol HC °Preparation H °Tucks ° °Indigestion: °Tums °Maalox °Mylanta °Zantac  °Pepcid ° °Insomnia: °Benadryl (alcohol free) 25mg every 6 hours as needed °Tylenol PM °Unisom, no Gelcaps ° °Leg Cramps: °Tums °MagGel ° °Nausea/Vomiting:  °Bonine °Dramamine °Emetrol °Ginger extract °Sea bands °Meclizine  °Nausea medication to take during pregnancy:  °Unisom (doxylamine succinate 25 mg tablets) Take one tablet daily at bedtime. If symptoms are not adequately controlled, the dose can be increased to a maximum recommended dose of two tablets daily (1/2 tablet in the morning, 1/2 tablet mid-afternoon and one at bedtime). °Vitamin B6 100mg tablets. Take one tablet twice a day (up to 200 mg per day). ° °Skin Rashes: °Aveeno products °Benadryl cream or 25mg every 6 hours as needed °Calamine Lotion °1% cortisone cream ° °Yeast infection: °Gyne-lotrimin 7 °Monistat 7 ° ° °**If taking multiple medications, please check labels to avoid duplicating the same active ingredients °**take medication as directed on  the label °** Do not exceed 4000 mg of tylenol in 24 hours °**Do not take medications that contain aspirin or ibuprofen ° ° ° ° °First Trimester of Pregnancy °The first trimester of pregnancy is from week 1 until the end of week 13 (months 1 through 3). A week after a sperm fertilizes an egg, the egg will implant on the wall of the uterus. This embryo will begin to develop into a baby. Genes from you and your partner will form the baby. The female genes will determine whether the baby will be a boy or a girl. At 6-8 weeks, the eyes and face will be formed, and the heartbeat can be seen on ultrasound. At the end of 12 weeks, all the baby's organs will be formed. °Now that you are pregnant, you will want to do everything you can to have a healthy baby. Two of the most important things are to get good prenatal care and to follow your health care provider's instructions. Prenatal care is all the medical care you receive before the baby's birth. This care will help prevent, find, and treat any problems during the pregnancy and childbirth. °Body changes during your first trimester °Your body goes through many changes during pregnancy. The changes vary from woman to woman. °· You may gain or lose a couple of pounds at first. °· You may feel sick to your stomach (nauseous) and you may throw up (vomit). If the vomiting is uncontrollable, call your health care provider. °· You may tire easily. °· You may develop headaches that can be relieved by medicines. All medicines should be approved by your health care provider. °· You may urinate more often. Painful urination may mean you have   a bladder infection. °· You may develop heartburn as a result of your pregnancy. °· You may develop constipation because certain hormones are causing the muscles that push stool through your intestines to slow down. °· You may develop hemorrhoids or swollen veins (varicose veins). °· Your breasts may begin to grow larger and become tender. Your  nipples may stick out more, and the tissue that surrounds them (areola) may become darker. °· Your gums may bleed and may be sensitive to brushing and flossing. °· Dark spots or blotches (chloasma, mask of pregnancy) may develop on your face. This will likely fade after the baby is born. °· Your menstrual periods will stop. °· You may have a loss of appetite. °· You may develop cravings for certain kinds of food. °· You may have changes in your emotions from day to day, such as being excited to be pregnant or being concerned that something may go wrong with the pregnancy and baby. °· You may have more vivid and strange dreams. °· You may have changes in your hair. These can include thickening of your hair, rapid growth, and changes in texture. Some women also have hair loss during or after pregnancy, or hair that feels dry or thin. Your hair will most likely return to normal after your baby is born. °What to expect at prenatal visits °During a routine prenatal visit: °· You will be weighed to make sure you and the baby are growing normally. °· Your blood pressure will be taken. °· Your abdomen will be measured to track your baby's growth. °· The fetal heartbeat will be listened to between weeks 10 and 14 of your pregnancy. °· Test results from any previous visits will be discussed. °Your health care provider may ask you: °· How you are feeling. °· If you are feeling the baby move. °· If you have had any abnormal symptoms, such as leaking fluid, bleeding, severe headaches, or abdominal cramping. °· If you are using any tobacco products, including cigarettes, chewing tobacco, and electronic cigarettes. °· If you have any questions. °Other tests that may be performed during your first trimester include: °· Blood tests to find your blood type and to check for the presence of any previous infections. The tests will also be used to check for low iron levels (anemia) and protein on red blood cells (Rh antibodies).  Depending on your risk factors, or if you previously had diabetes during pregnancy, you may have tests to check for high blood sugar that affects pregnant women (gestational diabetes). °· Urine tests to check for infections, diabetes, or protein in the urine. °· An ultrasound to confirm the proper growth and development of the baby. °· Fetal screens for spinal cord problems (spina bifida) and Down syndrome. °· HIV (human immunodeficiency virus) testing. Routine prenatal testing includes screening for HIV, unless you choose not to have this test. °· You may need other tests to make sure you and the baby are doing well. °Follow these instructions at home: °Medicines °· Follow your health care provider's instructions regarding medicine use. Specific medicines may be either safe or unsafe to take during pregnancy. °· Take a prenatal vitamin that contains at least 600 micrograms (mcg) of folic acid. °· If you develop constipation, try taking a stool softener if your health care provider approves. °Eating and drinking ° °· Eat a balanced diet that includes fresh fruits and vegetables, whole grains, good sources of protein such as meat, eggs, or tofu, and low-fat dairy. Your health   care provider will help you determine the amount of weight gain that is right for you.  Avoid raw meat and uncooked cheese. These carry germs that can cause birth defects in the baby.  Eating four or five small meals rather than three large meals a day may help relieve nausea and vomiting. If you start to feel nauseous, eating a few soda crackers can be helpful. Drinking liquids between meals, instead of during meals, also seems to help ease nausea and vomiting.  Limit foods that are high in fat and processed sugars, such as fried and sweet foods.  To prevent constipation: ? Eat foods that are high in fiber, such as fresh fruits and vegetables, whole grains, and beans. ? Drink enough fluid to keep your urine clear or pale  yellow. Activity  Exercise only as directed by your health care provider. Most women can continue their usual exercise routine during pregnancy. Try to exercise for 30 minutes at least 5 days a week. Exercising will help you: ? Control your weight. ? Stay in shape. ? Be prepared for labor and delivery.  Experiencing pain or cramping in the lower abdomen or lower back is a good sign that you should stop exercising. Check with your health care provider before continuing with normal exercises.  Try to avoid standing for long periods of time. Move your legs often if you must stand in one place for a long time.  Avoid heavy lifting.  Wear low-heeled shoes and practice good posture.  You may continue to have sex unless your health care provider tells you not to. Relieving pain and discomfort  Wear a good support bra to relieve breast tenderness.  Take warm sitz baths to soothe any pain or discomfort caused by hemorrhoids. Use hemorrhoid cream if your health care provider approves.  Rest with your legs elevated if you have leg cramps or low back pain.  If you develop varicose veins in your legs, wear support hose. Elevate your feet for 15 minutes, 3-4 times a day. Limit salt in your diet. Prenatal care  Schedule your prenatal visits by the twelfth week of pregnancy. They are usually scheduled monthly at first, then more often in the last 2 months before delivery.  Write down your questions. Take them to your prenatal visits.  Keep all your prenatal visits as told by your health care provider. This is important. Safety  Wear your seat belt at all times when driving.  Make a list of emergency phone numbers, including numbers for family, friends, the hospital, and police and fire departments. General instructions  Ask your health care provider for a referral to a local prenatal education class. Begin classes no later than the beginning of month 6 of your pregnancy.  Ask for help if  you have counseling or nutritional needs during pregnancy. Your health care provider can offer advice or refer you to specialists for help with various needs.  Do not use hot tubs, steam rooms, or saunas.  Do not douche or use tampons or scented sanitary pads.  Do not cross your legs for long periods of time.  Avoid cat litter boxes and soil used by cats. These carry germs that can cause birth defects in the baby and possibly loss of the fetus by miscarriage or stillbirth.  Avoid all smoking, herbs, alcohol, and medicines not prescribed by your health care provider. Chemicals in these products affect the formation and growth of the baby.  Do not use any products that contain nicotine  or tobacco, such as cigarettes and e-cigarettes. If you need help quitting, ask your health care provider. You may receive counseling support and other resources to help you quit. °· Schedule a dentist appointment. At home, brush your teeth with a soft toothbrush and be gentle when you floss. °Contact a health care provider if: °· You have dizziness. °· You have mild pelvic cramps, pelvic pressure, or nagging pain in the abdominal area. °· You have persistent nausea, vomiting, or diarrhea. °· You have a bad smelling vaginal discharge. °· You have pain when you urinate. °· You notice increased swelling in your face, hands, legs, or ankles. °· You are exposed to fifth disease or chickenpox. °· You are exposed to German measles (rubella) and have never had it. °Get help right away if: °· You have a fever. °· You are leaking fluid from your vagina. °· You have spotting or bleeding from your vagina. °· You have severe abdominal cramping or pain. °· You have rapid weight gain or loss. °· You vomit blood or material that looks like coffee grounds. °· You develop a severe headache. °· You have shortness of breath. °· You have any kind of trauma, such as from a fall or a car accident. °Summary °· The first trimester of pregnancy is  from week 1 until the end of week 13 (months 1 through 3). °· Your body goes through many changes during pregnancy. The changes vary from woman to woman. °· You will have routine prenatal visits. During those visits, your health care provider will examine you, discuss any test results you may have, and talk with you about how you are feeling. °This information is not intended to replace advice given to you by your health care provider. Make sure you discuss any questions you have with your health care provider. °Document Revised: 05/24/2017 Document Reviewed: 05/23/2016 °Elsevier Patient Education © 2020 [employer] ° °

## 2020-05-30 LAB — GC/CHLAMYDIA PROBE AMP (~~LOC~~) NOT AT ARMC
Chlamydia: NEGATIVE
Comment: NEGATIVE
Comment: NORMAL
Neisseria Gonorrhea: NEGATIVE

## 2020-06-02 ENCOUNTER — Ambulatory Visit: Payer: Medicaid Other

## 2020-06-07 ENCOUNTER — Ambulatory Visit (INDEPENDENT_AMBULATORY_CARE_PROVIDER_SITE_OTHER): Payer: Medicaid Other | Admitting: Primary Care

## 2020-06-09 ENCOUNTER — Ambulatory Visit (INDEPENDENT_AMBULATORY_CARE_PROVIDER_SITE_OTHER): Payer: Medicaid Other | Admitting: Primary Care

## 2020-06-13 ENCOUNTER — Other Ambulatory Visit: Payer: Self-pay

## 2020-06-13 ENCOUNTER — Inpatient Hospital Stay (HOSPITAL_COMMUNITY)
Admission: AD | Admit: 2020-06-13 | Discharge: 2020-06-13 | Disposition: A | Discharge status: Home / Self Care | Payer: Medicaid Other | Attending: Obstetrics & Gynecology | Admitting: Obstetrics & Gynecology

## 2020-06-13 DIAGNOSIS — W109XXA Fall (on) (from) unspecified stairs and steps, initial encounter: Secondary | ICD-10-CM | POA: Insufficient documentation

## 2020-06-13 DIAGNOSIS — O468X1 Other antepartum hemorrhage, first trimester: Secondary | ICD-10-CM | POA: Diagnosis not present

## 2020-06-13 DIAGNOSIS — O99891 Other specified diseases and conditions complicating pregnancy: Secondary | ICD-10-CM

## 2020-06-13 DIAGNOSIS — Z7951 Long term (current) use of inhaled steroids: Secondary | ICD-10-CM | POA: Insufficient documentation

## 2020-06-13 DIAGNOSIS — J45909 Unspecified asthma, uncomplicated: Secondary | ICD-10-CM | POA: Diagnosis not present

## 2020-06-13 DIAGNOSIS — O99511 Diseases of the respiratory system complicating pregnancy, first trimester: Secondary | ICD-10-CM | POA: Diagnosis not present

## 2020-06-13 DIAGNOSIS — Z79899 Other long term (current) drug therapy: Secondary | ICD-10-CM | POA: Insufficient documentation

## 2020-06-13 DIAGNOSIS — F17210 Nicotine dependence, cigarettes, uncomplicated: Secondary | ICD-10-CM | POA: Insufficient documentation

## 2020-06-13 DIAGNOSIS — O208 Other hemorrhage in early pregnancy: Secondary | ICD-10-CM | POA: Diagnosis present

## 2020-06-13 DIAGNOSIS — O99331 Smoking (tobacco) complicating pregnancy, first trimester: Secondary | ICD-10-CM | POA: Diagnosis not present

## 2020-06-13 DIAGNOSIS — W19XXXA Unspecified fall, initial encounter: Secondary | ICD-10-CM

## 2020-06-13 DIAGNOSIS — O418X10 Other specified disorders of amniotic fluid and membranes, first trimester, not applicable or unspecified: Secondary | ICD-10-CM

## 2020-06-13 DIAGNOSIS — Z3A09 9 weeks gestation of pregnancy: Secondary | ICD-10-CM | POA: Diagnosis not present

## 2020-06-13 DIAGNOSIS — O0991 Supervision of high risk pregnancy, unspecified, first trimester: Secondary | ICD-10-CM | POA: Diagnosis not present

## 2020-06-13 DIAGNOSIS — O0990 Supervision of high risk pregnancy, unspecified, unspecified trimester: Secondary | ICD-10-CM

## 2020-06-13 LAB — URINALYSIS, ROUTINE W REFLEX MICROSCOPIC
Bacteria, UA: NONE SEEN
Bilirubin Urine: NEGATIVE
Glucose, UA: NEGATIVE mg/dL
Ketones, ur: NEGATIVE mg/dL
Leukocytes,Ua: NEGATIVE
Nitrite: NEGATIVE
Protein, ur: 100 mg/dL — AB
Specific Gravity, Urine: 1.016 (ref 1.005–1.030)
pH: 6 (ref 5.0–8.0)
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
## 2020-06-25 NOTE — L&D Delivery Note (Signed)
Delivery Note Leslie Yates is a 30 y.o. G2P0101 at [redacted]w[redacted]d admitted for elective IOL.   GBS Status: Negative/-- (07/06 1517) Maximum Maternal Temperature: 98.2  Labor course: Initial SVE: 5/60/-3. Augmentation with: AROM, Pitocin, and OP Foley. She then progressed to complete.  ROM: 2h 1m with clear fluid  Birth: At 2103 a viable female was delivered via spontaneous vaginal delivery (Presentation:cephalic;ROA). Nuchal cord present: No.  Shoulders and body delivered in usual fashion. Infant placed directly on mom's abdomen for bonding/skin-to-skin, baby dried and stimulated. Cord clamped x 2 after 1 minute and cut by family member. Cord blood collected. The placenta separated spontaneously and delivered via gentle cord traction.  Pitocin infused rapidly IV per protocol. Fundus firm with massage.  Placenta inspected and appears to be intact with a 3 VC.  Placenta/Cord with the following complications: none. Cord pH: n/a Sponge and instrument count were correct x2.  Intrapartum complications:  None Anesthesia:  epidural Episiotomy: none Lacerations: left labial, hemostatic, not repaired Suture Repair:  n/a EBL (mL): approximately  Infant: APGAR (1 MIN): 8  APGAR (5 MINS): 9  Infant weight: pending   Mom to postpartum.  Baby to Couplet care / Skin to Skin. Placenta to L&D   Plans to Breastfeed Contraception: Nexplanon Circumcision: N/A  Note sent to [facility]: [facility] for pp visit.  Brand Males, MSN, CNM 01/16/2021 9:21 PM

## 2020-06-30 ENCOUNTER — Encounter: Payer: Medicaid Other | Admitting: Obstetrics and Gynecology

## 2020-06-30 DIAGNOSIS — L989 Disorder of the skin and subcutaneous tissue, unspecified: Secondary | ICD-10-CM | POA: Diagnosis not present

## 2020-06-30 DIAGNOSIS — L819 Disorder of pigmentation, unspecified: Secondary | ICD-10-CM | POA: Diagnosis not present

## 2020-07-05 ENCOUNTER — Encounter (HOSPITAL_COMMUNITY): Payer: Self-pay | Admitting: Family Medicine

## 2020-07-05 ENCOUNTER — Inpatient Hospital Stay (HOSPITAL_COMMUNITY)
Admission: AD | Admit: 2020-07-05 | Discharge: 2020-07-05 | Disposition: A | Discharge status: Home / Self Care | Payer: Medicaid Other | Attending: Family Medicine | Admitting: Family Medicine

## 2020-07-05 ENCOUNTER — Inpatient Hospital Stay (HOSPITAL_COMMUNITY): Payer: Medicaid Other

## 2020-07-05 ENCOUNTER — Other Ambulatory Visit: Payer: Self-pay

## 2020-07-05 DIAGNOSIS — K5900 Constipation, unspecified: Secondary | ICD-10-CM

## 2020-07-05 DIAGNOSIS — O26893 Other specified pregnancy related conditions, third trimester: Secondary | ICD-10-CM | POA: Diagnosis not present

## 2020-07-05 DIAGNOSIS — R52 Pain, unspecified: Secondary | ICD-10-CM | POA: Diagnosis not present

## 2020-07-05 DIAGNOSIS — O99611 Diseases of the digestive system complicating pregnancy, first trimester: Secondary | ICD-10-CM | POA: Diagnosis not present

## 2020-07-05 DIAGNOSIS — O210 Mild hyperemesis gravidarum: Secondary | ICD-10-CM | POA: Diagnosis not present

## 2020-07-05 DIAGNOSIS — O99331 Smoking (tobacco) complicating pregnancy, first trimester: Secondary | ICD-10-CM | POA: Insufficient documentation

## 2020-07-05 DIAGNOSIS — F17210 Nicotine dependence, cigarettes, uncomplicated: Secondary | ICD-10-CM | POA: Diagnosis not present

## 2020-07-05 DIAGNOSIS — O99891 Other specified diseases and conditions complicating pregnancy: Secondary | ICD-10-CM

## 2020-07-05 DIAGNOSIS — Z3A12 12 weeks gestation of pregnancy: Secondary | ICD-10-CM | POA: Diagnosis not present

## 2020-07-05 DIAGNOSIS — Z3A11 11 weeks gestation of pregnancy: Secondary | ICD-10-CM | POA: Diagnosis not present

## 2020-07-05 DIAGNOSIS — O219 Vomiting of pregnancy, unspecified: Secondary | ICD-10-CM

## 2020-07-05 DIAGNOSIS — R161 Splenomegaly, not elsewhere classified: Secondary | ICD-10-CM | POA: Insufficient documentation

## 2020-07-05 DIAGNOSIS — E876 Hypokalemia: Secondary | ICD-10-CM | POA: Insufficient documentation

## 2020-07-05 DIAGNOSIS — R109 Unspecified abdominal pain: Secondary | ICD-10-CM | POA: Diagnosis not present

## 2020-07-05 DIAGNOSIS — O26891 Other specified pregnancy related conditions, first trimester: Secondary | ICD-10-CM | POA: Diagnosis not present

## 2020-07-05 DIAGNOSIS — R1084 Generalized abdominal pain: Secondary | ICD-10-CM | POA: Diagnosis not present

## 2020-07-05 DIAGNOSIS — O0990 Supervision of high risk pregnancy, unspecified, unspecified trimester: Secondary | ICD-10-CM

## 2020-07-05 DIAGNOSIS — R609 Edema, unspecified: Secondary | ICD-10-CM | POA: Diagnosis not present

## 2020-07-05 LAB — CBC WITH DIFFERENTIAL/PLATELET
Abs Immature Granulocytes: 0.02 10*3/uL (ref 0.00–0.07)
Basophils Absolute: 0 10*3/uL (ref 0.0–0.1)
Basophils Relative: 0 %
Eosinophils Absolute: 0 10*3/uL (ref 0.0–0.5)
Eosinophils Relative: 0 %
HCT: 32.6 % — ABNORMAL LOW (ref 36.0–46.0)
Hemoglobin: 11.6 g/dL — ABNORMAL LOW (ref 12.0–15.0)
Immature Granulocytes: 0 %
Lymphocytes Relative: 7 %
Lymphs Abs: 0.4 10*3/uL — ABNORMAL LOW (ref 0.7–4.0)
MCH: 31.2 pg (ref 26.0–34.0)
MCHC: 35.6 g/dL (ref 30.0–36.0)
MCV: 87.6 fL (ref 80.0–100.0)
Monocytes Absolute: 0.5 10*3/uL (ref 0.1–1.0)
Monocytes Relative: 9 %
Neutro Abs: 4.8 10*3/uL (ref 1.7–7.7)
Neutrophils Relative %: 84 %
Platelets: 154 10*3/uL (ref 150–400)
RBC: 3.72 MIL/uL — ABNORMAL LOW (ref 3.87–5.11)
RDW: 11.9 % (ref 11.5–15.5)
WBC: 5.7 10*3/uL (ref 4.0–10.5)
nRBC: 0 % (ref 0.0–0.2)

## 2020-07-05 LAB — URINALYSIS, ROUTINE W REFLEX MICROSCOPIC
Bilirubin Urine: NEGATIVE
Glucose, UA: NEGATIVE mg/dL
Hgb urine dipstick: NEGATIVE
Ketones, ur: 5 mg/dL — AB
Nitrite: NEGATIVE
Protein, ur: NEGATIVE mg/dL
Specific Gravity, Urine: 1.008 (ref 1.005–1.030)
pH: 8 (ref 5.0–8.0)

## 2020-07-05 LAB — COMPREHENSIVE METABOLIC PANEL
ALT: 12 U/L (ref 0–44)
AST: 15 U/L (ref 15–41)
Albumin: 3.5 g/dL (ref 3.5–5.0)
Alkaline Phosphatase: 27 U/L — ABNORMAL LOW (ref 38–126)
Anion gap: 10 (ref 5–15)
BUN: 5 mg/dL — ABNORMAL LOW (ref 6–20)
CO2: 19 mmol/L — ABNORMAL LOW (ref 22–32)
Calcium: 8.6 mg/dL — ABNORMAL LOW (ref 8.9–10.3)
Chloride: 102 mmol/L (ref 98–111)
Creatinine, Ser: 0.73 mg/dL (ref 0.44–1.00)
GFR, Estimated: >60 mL/min (ref >60)
Glucose, Bld: 95 mg/dL (ref 70–99)
Potassium: 3.1 mmol/L — ABNORMAL LOW (ref 3.5–5.1)
Sodium: 131 mmol/L — ABNORMAL LOW (ref 135–145)
Total Bilirubin: 0.7 mg/dL (ref 0.3–1.2)
Total Protein: 6.1 g/dL — ABNORMAL LOW (ref 6.5–8.1)

## 2020-07-05 LAB — LIPASE, BLOOD: Lipase: 24 U/L (ref 11–51)

## 2020-07-05 MED ORDER — ONDANSETRON 4 MG PO TBDP
8.0000 mg | ORAL_TABLET | Freq: Once | ORAL | Status: AC
  Administered 2020-07-05: 8 mg via ORAL
  Filled 2020-07-05: qty 2

## 2020-07-05 MED ORDER — MORPHINE SULFATE (PF) 4 MG/ML IV SOLN
4.0000 mg | Freq: Once | INTRAVENOUS | Status: AC
  Administered 2020-07-05: 4 mg via INTRAMUSCULAR
  Filled 2020-07-05: qty 1

## 2020-07-05 NOTE — MAU Note (Addendum)
Presents EMS with c/o mid upper abdominal pain that began yesterday.  States did Fleets enema yesterday @ approx 1700 d/t constipation, states was straining to have BM and heard a pop in upper abdomen & has pain ever since.No results from enema.  Reports pain has progressively worsened, took Tylenol last @ 0700 this morning.

## 2020-07-05 NOTE — Discharge Instructions (Signed)
Constipation, Adult Constipation is when a person has fewer than three bowel movements in a week, has difficulty having a bowel movement, or has stools (feces) that are dry, hard, or larger than normal. Constipation may be caused by an underlying condition. It may become worse with age if a person takes certain medicines and does not take in enough fluids. Follow these instructions at home: Eating and drinking  Eat foods that have a lot of fiber, such as beans, whole grains, and fresh fruits and vegetables.  Limit foods that are low in fiber and high in fat and processed sugars, such as fried or sweet foods. These include french fries, hamburgers, cookies, candies, and soda.  Drink enough fluid to keep your urine pale yellow.   General instructions  Exercise regularly or as told by your health care provider. Try to do 150 minutes of moderate exercise each week.  Use the bathroom when you have the urge to go. Do not hold it in.  Take over-the-counter and prescription medicines only as told by your health care provider. This includes any fiber supplements.  During bowel movements: ? Practice deep breathing while relaxing the lower abdomen. ? Practice pelvic floor relaxation.  Watch your condition for any changes. Let your health care provider know about them.  Keep all follow-up visits as told by your health care provider. This is important. Contact a health care provider if:  You have pain that gets worse.  You have a fever.  You do not have a bowel movement after 4 days.  You vomit.  You are not hungry or you lose weight.  You are bleeding from the opening between the buttocks (anus).  You have thin, pencil-like stools. Get help right away if:  You have a fever and your symptoms suddenly get worse.  You leak stool or have blood in your stool.  Your abdomen is bloated.  You have severe pain in your abdomen.  You feel dizzy or you faint. Summary  Constipation is  when a person has fewer than three bowel movements in a week, has difficulty having a bowel movement, or has stools (feces) that are dry, hard, or larger than normal.  Eat foods that have a lot of fiber, such as beans, whole grains, and fresh fruits and vegetables.  Drink enough fluid to keep your urine pale yellow.  Take over-the-counter and prescription medicines only as told by your health care provider. This includes any fiber supplements. This information is not intended to replace advice given to you by your health care provider. Make sure you discuss any questions you have with your health care provider. Document Revised: 04/29/2019 Document Reviewed: 04/29/2019 Elsevier Patient Education  2021 [employer] Enlarged Spleen  An enlarged spleen (splenomegaly) is when the spleen is larger than normal. The spleen is an organ that is located in the upper left area of the abdomen, just under the ribs. The spleen is like a storage unit for red blood cells, and it also works to filter and clean the blood. It destroys cells that are damaged or worn out. The spleen is also important for fighting disease. This condition is usually noticed when the spleen is almost twice its normal size. An enlarged spleen is usually a sign of another health problem. What are the causes? This condition may be caused by:  Mononucleosis and other viral infections.  Infection with certain bacteria or parasites.  Liver failure (cirrhosis) and other liver diseases.  Blood diseases, such as hemolytic  anemia.  Blood cancers, such as leukemia or Hodgkin's disease. Other causes include:  Tumors and fluid-filled sacs (cysts).  Metabolic disorders, such as Gaucher disease or Niemann-Pick disease.  Pressure or blood clots in the veins of the spleen.  Connective tissue disorders, such as lupus or rheumatoid arthritis. What are the signs or symptoms? Symptoms of this condition include:  Pain in the upper left  part of the abdomen. The pain may spread to the left shoulder or get worse when you take a breath.  Feeling full without eating or after eating only a small amount.  Feeling tired.  Chronic infections.  Bleeding or bruising easily. In some cases, there are no symptoms. How is this diagnosed? This condition is diagnosed based on:  A physical exam. Your health care provider will feel the left upper part of your abdomen.  Tests, such as: ? Blood tests to check red and white blood cells and other proteins and enzymes. ? A biopsy. During a biopsy, a tissue sample of the liver or bone marrow may be removed and looked at under a microscope. A biopsy may be done if there is concern that the liver or bone marrow is causing the enlarged spleen. ? An abdominal ultrasound. ? A CT scan. ? An MRI. How is this treated? Treatment for this condition depends on the cause. Treatment aims to:  Manage the conditions that cause enlargement of the spleen.  Reduce the size of the spleen. Treatment may include:  Medicines to treat infection or disease.  Radiation therapy.  Blood transfusions. If these treatments do not help or if the cause cannot be found, surgery to remove the spleen (splenectomy) may be recommended. After surgery, you may need to have vaccinations or take antibiotics to prevent infections. Follow these instructions at home: Medicines  Take over-the-counter and prescription medicines only as told by your health care provider.  If you were prescribed an antibiotic medicine, take it as told by your health care provider. Do not stop taking the antibiotic even if you start to feel better.  Talk with your health care provider about whether you need vaccinations to help prevent infections. This may be needed if treatment included surgery to remove the spleen. Not having a spleen makes certain infections more dangerous because it weakens the body's disease-fighting system (immune  system). General instructions  Follow instructions from your health care provider about limiting your activities. To avoid injury or a ruptured spleen, make sure you: ? Avoid contact sports. ? Wear a seat belt in the car.  Keep all follow-up visits as told by your health care provider. This is important. Contact a health care provider if:  Your symptoms do not improve as expected.  You have a fever or chills.  You feel generally ill.  You have increased pain when you take in a breath. Get help right away if you:  Experience an injury or impact to the spleen area.  Have abdominal pain that becomes severe.  Feel dizzy or you faint.  Feel very weak.  Have cold and clammy skin.  Have sweating for no reason.  Have chest pain or difficulty breathing. Summary  An enlarged spleen (splenomegaly) is when the spleen is larger than normal.  An enlarged spleen is usually a sign of another health problem.  This condition is treated with medicines, radiation therapy, blood transfusions, vaccines, or surgery. This information is not intended to replace advice given to you by your health care provider. Make sure you discuss  any questions you have with your health care provider. Document Revised: 07/10/2018 Document Reviewed: 04/29/2018 Elsevier Patient Education  2021 [employer].

## 2020-07-05 NOTE — MAU Provider Note (Signed)
History     CSN: [id number]  Arrival date and time: 07/05/20 1607   Event Date/Time   First Provider Initiated Contact with Patient 07/05/20 1656      Chief Complaint  Patient presents with  . Abdominal Pain   Leslie Cockingkeiba J Dosh is a 30 y.o. G2P0101 at 663w4d who [facility].  She presents today for Abdominal Pain.  Patient states she has been "constipated and backed up for 5 days."  She reports she gave herself an enema yesterday at 5pm.  She states she was sitting on the toilet and was "straining trying to use the bathroom" and "felt something pop."  Patient states she has been experiencing pain since.  She describes the pain as "sharp stabbing pains" that is constant and is aggravated by sitting up, but improved with laying down. Patient states she has taken tylenol with no relief of symptoms; last dose 7am. Patient reports vaginal discharge, but states she has been unable to fill her prescription for yeast treatment d/t cost.     OB History    Gravida  2   Para  1   Term  0   Preterm  1   AB  0   Living  1     SAB  0   IAB  0   Ectopic  0   Multiple  0   Live Births  1           Past Medical History:  Diagnosis Date  . Asthma   . Obesity   . Ovarian cyst   . Sickle cell trait (HCC)   . Supervision of high risk pregnancy, antepartum 06/01/2019    Nursing Staff Provider Office Location  [facility] Dating   Language  English Anatomy US  Normal  Flu Vaccine   Genetic Screen  NIPS:    AFP: screen negative   TDaP vaccine    Hgb A1C or  GTT Early  Third trimester  Rhogam     LAB RESULTS  Feeding Plan Breast Blood Type O/Positive/-- (12/18 1029)  Contraception Pills Antibody Negative (12/18 1029) Circumcision If boy, yes Rubella 1.74 (12/18 102    Past Surgical History:  Procedure Laterality Date  . INCISION AND DRAINAGE ABSCESS      Family History  Problem Relation Age of Onset  . Diabetes Mother   . Hypertension Mother   . Diabetes Father    . Hypertension Father     Social History   Tobacco Use  . Smoking status: Current Every Day Smoker    Packs/day: 1.00    Years: 11.00    Pack years: 11.00    Types: Cigarettes  . Smokeless tobacco: Never Used  Vaping Use  . Vaping Use: Never used  Substance Use Topics  . Alcohol use: Not Currently    Comment: occ  . Drug use: Not Currently    Types: Benzodiazepines, Marijuana    Comment: States she never fill prescription, last time marijuana use yesterday    Allergies:  Allergies  Allergen Reactions  . Penicillins Rash    Childhood rash Has patient had a PCN reaction causing immediate rash, facial/tongue/throat swelling, SOB or lightheadedness with hypotension: no Has patient had a PCN reaction causing severe rash involving mucus membranes or skin necrosis:no Has patient had a PCN reaction that required hospitalization no Has patient had a PCN reaction occurring within the last 10 years: no If all of the above answers are "NO", then may proceed with Cephalosporin  use.     Medications Prior to Admission  Medication Sig Dispense Refill Last Dose  . acetaminophen (TYLENOL) 325 MG tablet Take 650 mg by mouth every 6 (six) hours as needed for mild pain or headache. (Patient not taking: Reported on 05/23/2020)     . albuterol (VENTOLIN HFA) 108 (90 Base) MCG/ACT inhaler Inhale 2 puffs into the lungs every 6 (six) hours as needed for wheezing or shortness of breath. 18 g 1   . ASPIRIN 81 PO Take by mouth. (Patient not taking: Reported on 05/23/2020)     . Blood Pressure Monitoring (BLOOD PRESSURE MONITOR AUTOMAT) DEVI 1 Device by Does not apply route daily. Automatic blood pressure cuff regular/x-large size cuff. To monitor blood pressure regularly at home. ICD-10 code: O09.90. 1 each 0   . fluticasone (FLOVENT HFA) 110 MCG/ACT inhaler Inhale 2 puffs into the lungs in the morning and at bedtime. 12 g 12   . hydrocortisone-pramoxine (PROCTOFOAM-HC) rectal foam Place 1 applicator  rectally 2 (two) times daily. (Patient not taking: Reported on 05/23/2020) 10 g 6   . Prenatal 27-1 MG TABS Take 1 tablet by mouth daily. 30 tablet 12   . scopolamine (TRANSDERM-SCOP, 1.5 MG,) 1 MG/3DAYS Place 1 patch (1.5 mg total) onto the skin every 3 (three) days. (Patient not taking: Reported on 05/23/2020) 10 patch 12   . terconazole (TERAZOL 7) 0.4 % vaginal cream Place 1 applicator vaginally at bedtime. 45 g 0     Review of Systems  Constitutional: Positive for chills. Negative for fever.  Respiratory: Negative for cough and shortness of breath.   Gastrointestinal: Positive for abdominal pain, constipation, nausea and vomiting.  Genitourinary: Positive for vaginal discharge. Negative for difficulty urinating, dysuria, pelvic pain and vaginal bleeding.  Musculoskeletal: Negative for back pain.  Neurological: Positive for headaches. Negative for dizziness and light-headedness.   Physical Exam   Blood pressure 135/67, temperature 99.7 F (37.6 C), temperature source Oral, resp. rate 20, last menstrual period 04/08/2020, SpO2 100 %, not currently breastfeeding.  Physical Exam Vitals reviewed.  Constitutional:      Appearance: Normal appearance. She is well-developed.  HENT:     Head: Normocephalic and atraumatic.  Eyes:     Conjunctiva/sclera: Conjunctivae normal.  Cardiovascular:     Rate and Rhythm: Normal rate and regular rhythm.     Heart sounds: Normal heart sounds.  Pulmonary:     Effort: Pulmonary effort is normal. No respiratory distress.     Breath sounds: Normal breath sounds.  Abdominal:     General: Bowel sounds are normal.     Palpations: Abdomen is soft.     Tenderness: There is abdominal tenderness in the right upper quadrant and left upper quadrant. There is no guarding or rebound.     Comments: Reports pain predominately on right side and is intensified with palpation.  Pain on left side mild with palpation.   Musculoskeletal:     Cervical back: Normal  range of motion.  Skin:    General: Skin is warm and dry.  Neurological:     Mental Status: She is alert and oriented to person, place, and time.  Psychiatric:        Mood and Affect: Mood normal.        Behavior: Behavior normal.        Thought Content: Thought content normal.     MAU Course  Procedures Results for orders placed or performed during the hospital encounter of 07/05/20 (from the past 24  hour(s))  CBC with Differential/Platelet     Status: Abnormal   Collection Time: 07/05/20  4:40 PM  Result Value Ref Range   WBC 5.7 4.0 - 10.5 K/uL   RBC 3.72 (L) 3.87 - 5.11 MIL/uL   Hemoglobin 11.6 (L) 12.0 - 15.0 g/dL   HCT 67.2 (L) 09.4 - 70.9 %   MCV 87.6 80.0 - 100.0 fL   MCH 31.2 26.0 - 34.0 pg   MCHC 35.6 30.0 - 36.0 g/dL   RDW 62.8 36.6 - 29.4 %   Platelets 154 150 - 400 K/uL   nRBC 0.0 0.0 - 0.2 %   Neutrophils Relative % 84 %   Neutro Abs 4.8 1.7 - 7.7 K/uL   Lymphocytes Relative 7 %   Lymphs Abs 0.4 (L) 0.7 - 4.0 K/uL   Monocytes Relative 9 %   Monocytes Absolute 0.5 0.1 - 1.0 K/uL   Eosinophils Relative 0 %   Eosinophils Absolute 0.0 0.0 - 0.5 K/uL   Basophils Relative 0 %   Basophils Absolute 0.0 0.0 - 0.1 K/uL   Immature Granulocytes 0 %   Abs Immature Granulocytes 0.02 0.00 - 0.07 K/uL  Comprehensive metabolic panel     Status: Abnormal   Collection Time: 07/05/20  4:40 PM  Result Value Ref Range   Sodium 131 (L) 135 - 145 mmol/L   Potassium 3.1 (L) 3.5 - 5.1 mmol/L   Chloride 102 98 - 111 mmol/L   CO2 19 (L) 22 - 32 mmol/L   Glucose, Bld 95 70 - 99 mg/dL   BUN 5 (L) 6 - 20 mg/dL   Creatinine, Ser 7.65 0.44 - 1.00 mg/dL   Calcium 8.6 (L) 8.9 - 10.3 mg/dL   Total Protein 6.1 (L) 6.5 - 8.1 g/dL   Albumin 3.5 3.5 - 5.0 g/dL   AST 15 15 - 41 U/L   ALT 12 0 - 44 U/L   Alkaline Phosphatase 27 (L) 38 - 126 U/L   Total Bilirubin 0.7 0.3 - 1.2 mg/dL   GFR, Estimated >46 >50 mL/min   Anion gap 10 5 - 15  Lipase, blood     Status: None   Collection Time:  07/05/20  4:40 PM  Result Value Ref Range   Lipase 24 11 - 51 U/L  Urinalysis, Routine w reflex microscopic     Status: Abnormal   Collection Time: 07/05/20  5:42 PM  Result Value Ref Range   Color, Urine YELLOW YELLOW   APPearance HAZY (A) CLEAR   Specific Gravity, Urine 1.008 1.005 - 1.030   pH 8.0 5.0 - 8.0   Glucose, UA NEGATIVE NEGATIVE mg/dL   Hgb urine dipstick NEGATIVE NEGATIVE   Bilirubin Urine NEGATIVE NEGATIVE   Ketones, ur 5 (A) NEGATIVE mg/dL   Protein, ur NEGATIVE NEGATIVE mg/dL   Nitrite NEGATIVE NEGATIVE   Leukocytes,Ua SMALL (A) NEGATIVE   RBC / HPF 0-5 0 - 5 RBC/hpf   WBC, UA 0-5 0 - 5 WBC/hpf   Bacteria, UA RARE (A) NONE SEEN   Squamous Epithelial / LPF 6-10 0 - 5   Patient informed that the ultrasound is considered a limited OB ultrasound and is not intended to be a complete ultrasound exam.  Patient also informed that the ultrasound is not being completed with the intent of assessing for fetal or placental anomalies or any pelvic abnormalities.  Explained that the purpose of today's ultrasound is to assess for  viability.  Patient acknowledges the purpose of the exam and  the limitations of the study.      MDM Physical Exam Labs: CBC/D, CMP, Lipase Pain Medication Ultrasound-Abdominal and Bedside Assessment and Plan  30 year old G2P0101 SIUP at 11.3 weeks Abdominal Pain Constipation Nausea  -POC Reviewed -Exam performed and findings discussed. -Will give zofran for nausea.  -Patient offered pain medication and accepts. -Will give 4mg  IM morphine. -Labs ordered and collected. -Will send for abdominal and await results.  [place] 07/05/2020, 4:56 PM   Reassessment (7:32 PM) Splenomegaly Mild Hypokalemia  -Results discussed with patient. -Informed that splenomegaly likely incidental finding considering pain on opposite side of complaint.  -Patient reports pain has subsided with morphine dosing. -Discussed bowel regime and will plan to  send scripts tomorrow, during prenatal visit, due to need for prior authorizations.  -Will also send in Kdur at that time as well as Zofran for nausea. -BSUS performed to confirm FHT. -Patient and SO without questions or concerns. -Encouraged to call or return to MAU if symptoms worsen or with the onset of new symptoms. -Discharged to home in stable condition.  09/02/2020 MSN, CNM Advanced Practice Provider, [facility]

## 2020-07-06 ENCOUNTER — Other Ambulatory Visit (HOSPITAL_COMMUNITY)
Admission: RE | Admit: 2020-07-06 | Discharge: 2020-07-06 | Disposition: A | Discharge status: Home / Self Care | Payer: Medicaid Other | Source: Ambulatory Visit | Attending: Obstetrics and Gynecology | Admitting: Obstetrics and Gynecology

## 2020-07-06 ENCOUNTER — Ambulatory Visit (INDEPENDENT_AMBULATORY_CARE_PROVIDER_SITE_OTHER): Payer: Medicaid Other

## 2020-07-06 VITALS — BP 99/62 | HR 70 | Temp 98.0°F | Wt 262.4 lb

## 2020-07-06 DIAGNOSIS — O0990 Supervision of high risk pregnancy, unspecified, unspecified trimester: Secondary | ICD-10-CM

## 2020-07-06 DIAGNOSIS — B373 Candidiasis of vulva and vagina: Secondary | ICD-10-CM

## 2020-07-06 DIAGNOSIS — O219 Vomiting of pregnancy, unspecified: Secondary | ICD-10-CM

## 2020-07-06 DIAGNOSIS — Z3A11 11 weeks gestation of pregnancy: Secondary | ICD-10-CM

## 2020-07-06 DIAGNOSIS — K5900 Constipation, unspecified: Secondary | ICD-10-CM

## 2020-07-06 DIAGNOSIS — Z3481 Encounter for supervision of other normal pregnancy, first trimester: Secondary | ICD-10-CM | POA: Diagnosis not present

## 2020-07-06 DIAGNOSIS — E876 Hypokalemia: Secondary | ICD-10-CM

## 2020-07-06 DIAGNOSIS — O99210 Obesity complicating pregnancy, unspecified trimester: Secondary | ICD-10-CM

## 2020-07-06 DIAGNOSIS — B3731 Acute candidiasis of vulva and vagina: Secondary | ICD-10-CM

## 2020-07-06 MED ORDER — FAMOTIDINE 20 MG PO TABS: 20.0000 mg | ORAL_TABLET | Freq: Every day | ORAL | 2 refills | Status: DC

## 2020-07-06 MED ORDER — POLYETHYLENE GLYCOL 3350 17 G PO PACK: 17.0000 g | PACK | Freq: Every day | ORAL | 0 refills | Status: DC

## 2020-07-06 MED ORDER — ASPIRIN EC 81 MG PO TBEC: 81.0000 mg | DELAYED_RELEASE_TABLET | Freq: Every day | ORAL | 2 refills | Status: DC

## 2020-07-06 MED ORDER — TERCONAZOLE 0.4 % VA CREA: 1.0000 | TOPICAL_CREAM | Freq: Every day | VAGINAL | 0 refills | Status: DC

## 2020-07-06 MED ORDER — DOXYLAMINE-PYRIDOXINE 10-10 MG PO TBEC: 2.0000 | DELAYED_RELEASE_TABLET | Freq: Every day | ORAL | 2 refills | Status: DC

## 2020-07-06 MED ORDER — DOCUSATE SODIUM 100 MG PO CAPS: 100.0000 mg | ORAL_CAPSULE | Freq: Every day | ORAL | 2 refills | Status: DC

## 2020-07-06 MED ORDER — POTASSIUM CHLORIDE ER 20 MEQ PO TBCR: 20.0000 meq | EXTENDED_RELEASE_TABLET | Freq: Every day | ORAL | 1 refills | Status: DC

## 2020-07-06 NOTE — Progress Notes (Signed)
Subjective:   Leslie Yates is a 30 y.o. G2P0101 at [redacted]w[redacted]d by 6 week early ultrasound being seen today for her first obstetrical visit.    Gynecological/Obstetrical History: Her obstetrical history is significant for smoker and HELLP. Patient does intend to breast feed. Pregnancy history fully reviewed. Patient reports constipation.   Sexual Activity and Vaginal Concerns: No pain or discomfort. Denies vaginal concerns or complaints.    Medical History/ROS: Patient reports continued abdominal pain, but not of same intensity as noted yesterday.  Patient with constipation for the past 6 days.     Social History: Patient is current one day pack smoker of tobacco.  She denies current alcohol or drugs but admits to history of marijuana use.  Patient reports the FOB is [name] who is supportive.  Patient reports that she lives with Mother and daughter [name]) and endorses safety at home.  Patient denies DV/A. Patient is currently employed at [employer].  HISTORY: OB History  Gravida Para Term Preterm AB Living  2 1 0 1 0 1  SAB IAB Ectopic Multiple Live Births  0 0 0 0 1    # Outcome Date GA Lbr Len/2nd Weight Sex Delivery Anes PTL Lv  2 Current           1 Preterm 11/21/19 [redacted]w[redacted]d 08:31 / 00:07 5 lb 1 oz (2.296 kg) F Vag-Spont EPI  LIV     Name: TAWNY, RASPBERRY     Apgar1: 5  Apgar5: 8    Last pap smear was done July 2021 and was normal  Past Medical History:  Diagnosis Date  . Asthma   . Obesity   . Ovarian cyst   . Sickle cell trait (HCC)   . Supervision of high risk pregnancy, antepartum 06/01/2019    Nursing Staff Provider Office Location  [facility] Dating   Language  English Anatomy US  Normal  Flu Vaccine   Genetic Screen  NIPS:    AFP: screen negative   TDaP vaccine    Hgb A1C or  GTT Early  Third trimester  Rhogam     LAB RESULTS  Feeding Plan Breast Blood Type O/Positive/-- (12/18 1029)  Contraception Pills Antibody Negative (12/18 1029) Circumcision If boy,  yes Rubella 1.74 (12/18 102   Past Surgical History:  Procedure Laterality Date  . INCISION AND DRAINAGE ABSCESS     Family History  Problem Relation Age of Onset  . Diabetes Mother   . Hypertension Mother   . Diabetes Father   . Hypertension Father    Social History   Tobacco Use  . Smoking status: Current Every Day Smoker    Packs/day: 1.00    Years: 11.00    Pack years: 11.00    Types: Cigarettes  . Smokeless tobacco: Never Used  Vaping Use  . Vaping Use: Never used  Substance Use Topics  . Alcohol use: Not Currently    Comment: occ  . Drug use: Not Currently    Types: Benzodiazepines, Marijuana    Comment: States she never fill prescription, last time marijuana use yesterday   Allergies  Allergen Reactions  . Penicillins Rash    Childhood rash Has patient had a PCN reaction causing immediate rash, facial/tongue/throat swelling, SOB or lightheadedness with hypotension: no Has patient had a PCN reaction causing severe rash involving mucus membranes or skin necrosis:no Has patient had a PCN reaction that required hospitalization no Has patient had a PCN reaction occurring within the last 10 years: no  If all of the above answers are "NO", then may proceed with Cephalosporin use.    Current Outpatient Medications on File Prior to Visit  Medication Sig Dispense Refill  . albuterol (VENTOLIN HFA) 108 (90 Base) MCG/ACT inhaler Inhale 2 puffs into the lungs every 6 (six) hours as needed for wheezing or shortness of breath. 18 g 1  . Blood Pressure Monitoring (BLOOD PRESSURE MONITOR AUTOMAT) DEVI 1 Device by Does not apply route daily. Automatic blood pressure cuff regular/x-large size cuff. To monitor blood pressure regularly at home. ICD-10 code: O09.90. 1 each 0  . fluticasone (FLOVENT HFA) 110 MCG/ACT inhaler Inhale 2 puffs into the lungs in the morning and at bedtime. 12 g 12  . Prenatal 27-1 MG TABS Take 1 tablet by mouth daily. 30 tablet 12  . [DISCONTINUED]  pantoprazole (PROTONIX) 40 MG tablet Take 1 tablet (40 mg total) by mouth daily. 30 tablet 0   No current facility-administered medications on file prior to visit.    Review of Systems Pertinent items noted in HPI and remainder of comprehensive ROS otherwise negative.  Exam   Vitals:   07/06/20 1540  BP: 99/62  Pulse: 70  Temp: 98 F (36.7 C)  Weight: 262 lb 6.4 oz (119 kg)      Physical Exam Constitutional:      Appearance: Normal appearance.  HENT:     Head: Normocephalic and atraumatic.  Eyes:     Conjunctiva/sclera: Conjunctivae normal.  Cardiovascular:     Rate and Rhythm: Normal rate and regular rhythm.  Pulmonary:     Effort: Pulmonary effort is normal.     Breath sounds: Normal breath sounds.  Abdominal:     General: Bowel sounds are normal.  Musculoskeletal:        General: Normal range of motion.     Cervical back: Normal range of motion.  Neurological:     Mental Status: She is alert and oriented to person, place, and time.  Skin:    General: Skin is warm and dry.  Psychiatric:        Mood and Affect: Mood normal.        Behavior: Behavior normal.        Thought Content: Thought content normal.     Assessment:   30 y.o. year old G2P0101 Patient Active Problem List   Diagnosis Date Noted  . Supervision of high risk pregnancy, antepartum 05/23/2020  . Seizure (HCC) 11/16/2019  . Carrier of disease 07/01/2019  . Sickle cell trait (HCC)   . Obesity in pregnancy, antepartum 06/12/2019  . Tobacco use 10/28/2018  . Asthma 10/27/2018  . Complex cyst of right ovary 05/10/2017     Plan:  1. Supervision of high risk pregnancy, antepartum -Congratulations given and patient welcomed back to practice. -Anticipatory guidance for prenatal visits including labs, ultrasounds, and testing; Initial labs drawn. -Genetic Screening discussed, First trimester screen, Quad screen and NIPS: ordered. -Encouraged to complete MyChart Registration for her ability to  review results, send requests, and have questions addressed.  -Discussed estimated due date of January 21, 2021. -Ultrasound discussed; fetal anatomic survey: discussed and plan to schedule at next visit. . -Initiate prenatal vitamins; Rx sent to pharmacy on file.  -Influenza offered and declined. -Encouraged to seek out care at office or emergency room for urgent and/or emergent concerns. -Educated on the nature of [facility] with multiple MDs and other Advanced Practice Providers was explained to patient; also emphasized that residents,  students are part of our team. Informed of her right to refuse care as she deems appropriate.  -No questions or concerns.    2. [redacted] weeks gestation of pregnancy -Reports improvement in abdominal pain as reported at MAU visit.  3. Constipation, unspecified constipation type -Discussed management with medication. Colace/Miralax sent.  -Encouraged to increase fiber in diet and H2O intake.   4. Hypokalemia -Reviewed results from MAU visit. -Limited script for Kdur sent to pharmacy on file.   5. Candidiasis of vagina -Untreated from previous diagnosis -Rx for Terazol 7 sent to pharmacy on file.   6. Nausea and vomiting in pregnancy -Reviewed mgmt of symptoms. -Encouraged small frequent high protein meals throughout the day. -Discussed usage of Diclegis nightly to avoid hyperemesis as noted in previous pregnancy. -Will also send in script for Pepcid for heartburn associated with vomiting. -Script also sent for phenergan 12.5 for breakthrough symptoms.   7. Obesity in pregnancy, antepartum -Discussed initiation of bASA at 12+ weeks for h/o HELLP.    Problem list reviewed and updated. Routine obstetric precautions reviewed.  Orders Placed This Encounter  Procedures  . Culture, OB Urine  . Urine Culture, OB Reflex  . Genetic Screening    No follow-ups on file.     Cherre Robins, CNM 07/06/2020 4:18  PM

## 2020-07-07 ENCOUNTER — Inpatient Hospital Stay (HOSPITAL_COMMUNITY)
Admission: AD | Admit: 2020-07-07 | Discharge: 2020-07-07 | Disposition: A | Discharge status: Home / Self Care | Payer: Medicaid Other | Attending: Obstetrics and Gynecology | Admitting: Obstetrics and Gynecology

## 2020-07-07 ENCOUNTER — Other Ambulatory Visit: Payer: Self-pay

## 2020-07-07 DIAGNOSIS — R0981 Nasal congestion: Secondary | ICD-10-CM | POA: Diagnosis not present

## 2020-07-07 DIAGNOSIS — O99891 Other specified diseases and conditions complicating pregnancy: Secondary | ICD-10-CM | POA: Diagnosis not present

## 2020-07-07 DIAGNOSIS — R439 Unspecified disturbances of smell and taste: Secondary | ICD-10-CM | POA: Diagnosis not present

## 2020-07-07 DIAGNOSIS — R519 Headache, unspecified: Secondary | ICD-10-CM

## 2020-07-07 DIAGNOSIS — O98511 Other viral diseases complicating pregnancy, first trimester: Secondary | ICD-10-CM | POA: Insufficient documentation

## 2020-07-07 DIAGNOSIS — Z3A11 11 weeks gestation of pregnancy: Secondary | ICD-10-CM

## 2020-07-07 DIAGNOSIS — Z3A00 Weeks of gestation of pregnancy not specified: Secondary | ICD-10-CM | POA: Diagnosis not present

## 2020-07-07 DIAGNOSIS — O0990 Supervision of high risk pregnancy, unspecified, unspecified trimester: Secondary | ICD-10-CM

## 2020-07-07 NOTE — MAU Note (Signed)
Pt presents with complaint of headache, and  nasal congestion. Denies abd pain or bleeding.

## 2020-07-07 NOTE — Patient Instructions (Signed)

## 2020-07-07 NOTE — Discharge Instructions (Signed)
Sinusitis, Adult Sinusitis is inflammation of your sinuses. Sinuses are hollow spaces in the bones around your face. Your sinuses are located:  Around your eyes.  In the middle of your forehead.  Behind your nose.  In your cheekbones. Mucus normally drains out of your sinuses. When your nasal tissues become inflamed or swollen, mucus can become trapped or blocked. This allows bacteria, viruses, and fungi to grow, which leads to infection. Most infections of the sinuses are caused by a virus. Sinusitis can develop quickly. It can last for up to 4 weeks (acute) or for more than 12 weeks (chronic). Sinusitis often develops after a cold. What are the causes? This condition is caused by anything that creates swelling in the sinuses or stops mucus from draining. This includes:  Allergies.  Asthma.  Infection from bacteria or viruses.  Deformities or blockages in your nose or sinuses.  Abnormal growths in the nose (nasal polyps).  Pollutants, such as chemicals or irritants in the air.  Infection from fungi (rare). What increases the risk? You are more likely to develop this condition if you:  Have a weak body defense system (immune system).  Do a lot of swimming or diving.  Overuse nasal sprays.  Smoke. What are the signs or symptoms? The main symptoms of this condition are pain and a feeling of pressure around the affected sinuses. Other symptoms include:  Stuffy nose or congestion.  Thick drainage from your nose.  Swelling and warmth over the affected sinuses.  Headache.  Upper toothache.  A cough that may get worse at night.  Extra mucus that collects in the throat or the back of the nose (postnasal drip).  Decreased sense of smell and taste.  Fatigue.  A fever.  Sore throat.  Bad breath. How is this diagnosed? This condition is diagnosed based on:  Your symptoms.  Your medical history.  A physical exam.  Tests to find out if your condition is  acute or chronic. This may include: ? Checking your nose for nasal polyps. ? Viewing your sinuses using a device that has a light (endoscope). ? Testing for allergies or bacteria. ? Imaging tests, such as an MRI or CT scan. In rare cases, a bone biopsy may be done to rule out more serious types of fungal sinus disease. How is this treated? Treatment for sinusitis depends on the cause and whether your condition is chronic or acute.  If caused by a virus, your symptoms should go away on their own within 10 days. You may be given medicines to relieve symptoms. They include: ? Medicines that shrink swollen nasal passages (topical intranasal decongestants). ? Medicines that treat allergies (antihistamines). ? A spray that eases inflammation of the nostrils (topical intranasal corticosteroids). ? Rinses that help get rid of thick mucus in your nose (nasal saline washes).  If caused by bacteria, your health care provider may recommend waiting to see if your symptoms improve. Most bacterial infections will get better without antibiotic medicine. You may be given antibiotics if you have: ? A severe infection. ? A weak immune system.  If caused by narrow nasal passages or nasal polyps, you may need to have surgery. Follow these instructions at home: Medicines  Take, use, or apply over-the-counter and prescription medicines only as told by your health care provider. These may include nasal sprays.  If you were prescribed an antibiotic medicine, take it as told by your health care provider. Do not stop taking the antibiotic even if you start   to feel better. Hydrate and humidify  Drink enough fluid to keep your urine pale yellow. Staying hydrated will help to thin your mucus.  Use a cool mist humidifier to keep the humidity level in your home above 50%.  Inhale steam for 10-15 minutes, 3-4 times a day, or as told by your health care provider. You can do this in the bathroom while a hot shower is  running.  Limit your exposure to cool or dry air.   Rest  Rest as much as possible.  Sleep with your head raised (elevated).  Make sure you get enough sleep each night. General instructions  Apply a warm, moist washcloth to your face 3-4 times a day or as told by your health care provider. This will help with discomfort.  Wash your hands often with soap and water to reduce your exposure to germs. If soap and water are not available, use hand sanitizer.  Do not smoke. Avoid being around people who are smoking (secondhand smoke).  Keep all follow-up visits as told by your health care provider. This is important.   Contact a health care provider if:  You have a fever.  Your symptoms get worse.  Your symptoms do not improve within 10 days. Get help right away if:  You have a severe headache.  You have persistent vomiting.  You have severe pain or swelling around your face or eyes.  You have vision problems.  You develop confusion.  Your neck is stiff.  You have trouble breathing. Summary  Sinusitis is soreness and inflammation of your sinuses. Sinuses are hollow spaces in the bones around your face.  This condition is caused by nasal tissues that become inflamed or swollen. The swelling traps or blocks the flow of mucus. This allows bacteria, viruses, and fungi to grow, which leads to infection.  If you were prescribed an antibiotic medicine, take it as told by your health care provider. Do not stop taking the antibiotic even if you start to feel better.  Keep all follow-up visits as told by your health care provider. This is important. This information is not intended to replace advice given to you by your health care provider. Make sure you discuss any questions you have with your health care provider. Document Revised: 11/11/2017 Document Reviewed: 11/11/2017 Elsevier Patient Education  2021 [employer]  

## 2020-07-07 NOTE — MAU Provider Note (Signed)
None     S Ms. Leslie Yates is a 30 y.o. G2P0101 patient who presents to MAU today with complaint of nasal congestion and loss of taste. She states her symptoms started this morning.  She reports she "does not have covid," but works at [employer].   O BP (!) 110/56 (BP Location: Right Arm)   Pulse 71   Temp 98.8 F (37.1 C)   Resp 17   Ht 6\' 1"  (1.854 m)   Wt 116.1 kg   LMP 04/08/2020 (Exact Date)   SpO2 100%   BMI 33.78 kg/m  Physical Exam Vitals reviewed.  Constitutional:      Appearance: Normal appearance. She is well-developed.  HENT:     Head: Normocephalic and atraumatic.  Eyes:     Conjunctiva/sclera: Conjunctivae normal.  Cardiovascular:     Rate and Rhythm: Normal rate and regular rhythm.     Heart sounds: Normal heart sounds.  Pulmonary:     Effort: Pulmonary effort is normal. No respiratory distress.     Breath sounds: Normal breath sounds.  Abdominal:     General: Bowel sounds are normal.     Palpations: Abdomen is soft.  Musculoskeletal:     Cervical back: Normal range of motion.  Skin:    General: Skin is warm and dry.  Neurological:     Mental Status: She is alert and oriented to person, place, and time.  Psychiatric:        Mood and Affect: Mood normal.        Behavior: Behavior normal.        Thought Content: Thought content normal.     A Medical screening exam complete Nasal Congestion  Loss of Taste  P -Patient informed that symptoms c/w Covid. -Discussed need for testing at outpatient center. -Given list for pregnancy safe medications. -Given note for OOW until Monday.  Instructed to stay out if results are not back.  -Encouraged to call office or send mychart message for non-emergent or urgent concerns. -Discharged to home in stable condition.  Saturday, CNM 07/07/2020 8:17 PM

## 2020-07-08 LAB — URINE CULTURE, OB REFLEX

## 2020-07-08 LAB — CULTURE, OB URINE

## 2020-07-09 MED ORDER — PROMETHAZINE HCL 12.5 MG PO TABS: 12.5000 mg | ORAL_TABLET | Freq: Four times a day (QID) | ORAL | 0 refills | Status: DC | PRN

## 2020-07-13 LAB — URINE CYTOLOGY ANCILLARY ONLY
Chlamydia: NEGATIVE
Comment: NEGATIVE
Comment: NEGATIVE
Comment: NORMAL
Neisseria Gonorrhea: NEGATIVE
Trichomonas: NEGATIVE

## 2020-07-18 ENCOUNTER — Encounter: Payer: Self-pay | Admitting: *Deleted

## 2020-08-01 ENCOUNTER — Other Ambulatory Visit: Payer: Self-pay | Admitting: *Deleted

## 2020-08-01 DIAGNOSIS — O0990 Supervision of high risk pregnancy, unspecified, unspecified trimester: Secondary | ICD-10-CM

## 2020-08-01 MED ORDER — PRENATAL 27-1 MG PO TABS: 1.0000 | ORAL_TABLET | Freq: Every day | ORAL | 12 refills | Status: DC

## 2020-08-04 ENCOUNTER — Telehealth (INDEPENDENT_AMBULATORY_CARE_PROVIDER_SITE_OTHER): Payer: Medicaid Other | Admitting: Obstetrics and Gynecology

## 2020-08-04 ENCOUNTER — Telehealth: Payer: Medicaid Other | Admitting: Obstetrics and Gynecology

## 2020-08-04 VITALS — BP 103/61 | HR 76 | Wt 259.3 lb

## 2020-08-04 DIAGNOSIS — O0990 Supervision of high risk pregnancy, unspecified, unspecified trimester: Secondary | ICD-10-CM

## 2020-08-04 DIAGNOSIS — Z3A15 15 weeks gestation of pregnancy: Secondary | ICD-10-CM | POA: Diagnosis not present

## 2020-08-04 DIAGNOSIS — O09899 Supervision of other high risk pregnancies, unspecified trimester: Secondary | ICD-10-CM

## 2020-08-04 DIAGNOSIS — O210 Mild hyperemesis gravidarum: Secondary | ICD-10-CM

## 2020-08-04 MED ORDER — ONDANSETRON 4 MG PO TBDP: 4.0000 mg | ORAL_TABLET | Freq: Three times a day (TID) | ORAL | 0 refills | Status: DC | PRN

## 2020-08-04 MED ORDER — METOCLOPRAMIDE HCL 10 MG PO TABS: 10.0000 mg | ORAL_TABLET | Freq: Four times a day (QID) | ORAL | 0 refills | Status: DC

## 2020-08-06 ENCOUNTER — Encounter: Payer: Self-pay | Admitting: Obstetrics and Gynecology

## 2020-08-06 NOTE — Progress Notes (Signed)
   MY CHART VIDEO VIRTUAL OBSTETRICS VISIT ENCOUNTER NOTE  I connected with Leslie Yates on 08/04/20 at  9:50 AM EST by My Chart video at home and verified that I am speaking with the correct person using two identifiers. Provider located at [facility] at [place].   I discussed the limitations, risks, security and privacy concerns of performing an evaluation and management service by My Chart video and the availability of in person appointments. I also discussed with the patient that there may be a patient responsible charge related to this service. The patient expressed understanding and agreed to proceed.  Subjective:  Leslie Yates is a 30 y.o. G2P0101 at [redacted]w[redacted]d being followed for ongoing prenatal care.  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  Patient reports nausea, vomiting and sharp pain in navel and rib area x 1 hour last night. Reports fetal movement. Denies any contractions, bleeding or leaking of fluid.   The following portions of the patient's history were reviewed and updated as appropriate: allergies, current medications, past family history, past medical history, past social history, past surgical history and problem list.   Objective:   General:  Alert, oriented and cooperative.   Mental Status: Normal mood and affect perceived. Normal judgment and thought content.  Rest of physical exam deferred due to type of encounter  BP 103/61   Pulse 76   Wt 259 lb 4.8 oz (117.6 kg)   LMP 04/08/2020 (Exact Date)   BMI 34.21 kg/m  **Done by patient's own at home BP cuff and scale  Assessment and Plan:  Pregnancy: G2P0101 at [redacted]w[redacted]d  1. Supervision of high risk pregnancy, antepartum - [place] MFM OB  DETAIL +14 WK; Future  2. Morning sickness - Rx for metoCLOPramide (REGLAN) 10 MG tablet; Take 1 tablet (10 mg total) by mouth every 6 (six) hours.  Dispense: 30 tablet; Refill: 0 - Rx for ondansetron (ZOFRAN ODT) 4 MG disintegrating tablet; Take 1 tablet (4 mg total) by mouth every 8 (eight) hours as needed for nausea or vomiting.  Dispense: 30 tablet; Refill: 0  3. History of preterm delivery, currently pregnant - Needs to start Makena weekly injections at 16 weeks  Preterm labor symptoms and general obstetric precautions including but not limited to vaginal bleeding, contractions, leaking of fluid and fetal movement were reviewed in detail with the patient.  I discussed the assessment and treatment plan with the patient. The patient was provided an opportunity to ask questions and all were answered. The patient agreed with the plan and demonstrated an understanding of the instructions. The patient was advised to call back or seek an in-person office evaluation/go to MAU at [facility] for any urgent or concerning symptoms. Please refer to After Visit Summary for other counseling recommendations.   I provided 10 minutes of non-face-to-face time during this encounter. There was 5 minutes of chart review time spent prior to this encounter. Total time spent = 15 minutes.  Return in about 4 weeks (around 09/01/2020) for Return OB - My Chart video.  No future appointments.  [name], CNM [facility], [facility]

## 2020-08-25 ENCOUNTER — Ambulatory Visit (INDEPENDENT_AMBULATORY_CARE_PROVIDER_SITE_OTHER): Payer: Medicaid Other | Admitting: Obstetrics and Gynecology

## 2020-08-25 ENCOUNTER — Other Ambulatory Visit: Payer: Self-pay

## 2020-08-25 ENCOUNTER — Encounter: Payer: Medicaid Other | Admitting: Obstetrics and Gynecology

## 2020-08-25 VITALS — BP 120/70 | HR 89 | Temp 98.0°F | Wt 263.6 lb

## 2020-08-25 DIAGNOSIS — O0990 Supervision of high risk pregnancy, unspecified, unspecified trimester: Secondary | ICD-10-CM | POA: Diagnosis not present

## 2020-08-25 DIAGNOSIS — Z3A18 18 weeks gestation of pregnancy: Secondary | ICD-10-CM

## 2020-08-25 DIAGNOSIS — O09899 Supervision of other high risk pregnancies, unspecified trimester: Secondary | ICD-10-CM | POA: Diagnosis not present

## 2020-08-25 MED ORDER — HYDROXYPROGESTERONE CAPROATE 275 MG/1.1ML ~~LOC~~ SOAJ
275.0000 mg | Freq: Once | SUBCUTANEOUS | Status: AC
  Administered 2020-08-25: 275 mg via SUBCUTANEOUS

## 2020-08-25 NOTE — Progress Notes (Signed)
   S: Patient in clinic for Makena 275 mg/1.1 mL single use-Auto-injector for history of preterm labor/delivery. Patient is [redacted]w[redacted]d gestation.   O: Need for Makena injection.  A: Makena 275 mg/1.1 ml subcutaneous given left arm. Patient tolerated injection. Injection site unremarkable. No unusual complaints today.  P: Next injection is due 09/01/2020.  Clovis Pu, RN

## 2020-08-27 ENCOUNTER — Encounter: Payer: Self-pay | Admitting: Obstetrics and Gynecology

## 2020-08-27 NOTE — Progress Notes (Signed)
   HIGH-RISK PREGNANCY OFFICE VISIT Patient name: Leslie Yates MRN [id number]  Date of birth: 23-Jun-1991 Chief Complaint:   Routine Prenatal Visit  History of Present Illness:   Leslie Yates is a 30 y.o. G7P0101 female at [redacted]w[redacted]d with an Estimated Date of Delivery: 01/21/21 being seen today for ongoing management of a high-risk pregnancy.  Today she reports no complaints. Starting weekly Makena injections today. Contractions: Not present. Vag. Bleeding: None.  Movement: Absent. denies leaking of fluid. Review of Systems:   Pertinent items are noted in HPI Denies abnormal vaginal discharge w/ itching/odor/irritation, headaches, visual changes, shortness of breath, chest pain, abdominal pain, severe nausea/vomiting, or problems with urination or bowel movements unless otherwise stated above. Pertinent History Reviewed:  Reviewed past medical,surgical, social, obstetrical and family history.  Reviewed problem list, medications and allergies. Physical Assessment:   Vitals:   08/25/20 1452  BP: 120/70  Pulse: 89  Temp: 98 F (36.7 C)  Weight: 263 lb 9.6 oz (119.6 kg)  Body mass index is 34.78 kg/m.        Physical Examination:   General appearance: Well appearing, and in no distress  Mental status: Alert, oriented to person, place, and time  Skin: Warm & dry  Cardiovascular: Normal heart rate noted  Respiratory: Normal respiratory effort, no distress  Abdomen: Soft, gravid, nontender  Pelvic: Cervical exam deferred         Extremities: Edema: None  Fetal Status: Fetal Heart Rate (bpm): 160   Movement: Absent    No results found for this or any previous visit (from the past 24 hour(s)).  Assessment & Plan:  1) High-risk pregnancy G2P0101 at [redacted]w[redacted]d with an Estimated Date of Delivery: 01/21/21   2) Supervision of high risk pregnancy, antepartum  - Plan: HYDROXYprogesterone caproate (Makena) autoinjector 275 mg  3) History of preterm delivery, currently pregnant  - Plan:  HYDROXYprogesterone caproate (Makena) autoinjector 275 mg  4) [redacted] weeks gestation of pregnancy    Meds:  Meds ordered this encounter  Medications  . HYDROXYprogesterone caproate (Makena) autoinjector 275 mg   Labs/procedures today: Makena injection  Plan:  Continue routine obstetrical care   Reviewed: Preterm labor symptoms and general obstetric precautions including but not limited to vaginal bleeding, contractions, leaking of fluid and fetal movement were reviewed in detail with the patient.  All questions were answered. Has home bp cuff. Check bp weekly, let us know if >140/90.   Follow-up: Return in about 1 week (around 09/01/2020) for Makena injections.  No orders of the defined types were placed in this encounter.  [name] MSN, CNM 08/25/2020

## 2020-08-31 ENCOUNTER — Other Ambulatory Visit: Payer: Self-pay

## 2020-08-31 ENCOUNTER — Encounter (HOSPITAL_COMMUNITY): Payer: Self-pay | Admitting: Obstetrics & Gynecology

## 2020-08-31 ENCOUNTER — Ambulatory Visit (INDEPENDENT_AMBULATORY_CARE_PROVIDER_SITE_OTHER): Payer: Medicaid Other | Admitting: *Deleted

## 2020-08-31 ENCOUNTER — Encounter: Payer: Medicaid Other | Admitting: Obstetrics and Gynecology

## 2020-08-31 ENCOUNTER — Inpatient Hospital Stay (HOSPITAL_COMMUNITY)
Admission: AD | Admit: 2020-08-31 | Discharge: 2020-08-31 | Disposition: A | Discharge status: Home / Self Care | Payer: Medicaid Other | Attending: Obstetrics & Gynecology | Admitting: Obstetrics & Gynecology

## 2020-08-31 VITALS — BP 112/71 | HR 67 | Temp 97.8°F | Wt 260.8 lb

## 2020-08-31 DIAGNOSIS — Z3A19 19 weeks gestation of pregnancy: Secondary | ICD-10-CM | POA: Insufficient documentation

## 2020-08-31 DIAGNOSIS — O09212 Supervision of pregnancy with history of pre-term labor, second trimester: Secondary | ICD-10-CM | POA: Diagnosis not present

## 2020-08-31 DIAGNOSIS — O0992 Supervision of high risk pregnancy, unspecified, second trimester: Secondary | ICD-10-CM | POA: Diagnosis not present

## 2020-08-31 DIAGNOSIS — O99612 Diseases of the digestive system complicating pregnancy, second trimester: Secondary | ICD-10-CM | POA: Diagnosis not present

## 2020-08-31 DIAGNOSIS — Z880 Allergy status to penicillin: Secondary | ICD-10-CM | POA: Diagnosis not present

## 2020-08-31 DIAGNOSIS — O09899 Supervision of other high risk pregnancies, unspecified trimester: Secondary | ICD-10-CM

## 2020-08-31 DIAGNOSIS — O0990 Supervision of high risk pregnancy, unspecified, unspecified trimester: Secondary | ICD-10-CM

## 2020-08-31 DIAGNOSIS — F17210 Nicotine dependence, cigarettes, uncomplicated: Secondary | ICD-10-CM | POA: Diagnosis not present

## 2020-08-31 DIAGNOSIS — O210 Mild hyperemesis gravidarum: Secondary | ICD-10-CM | POA: Diagnosis present

## 2020-08-31 DIAGNOSIS — K529 Noninfective gastroenteritis and colitis, unspecified: Secondary | ICD-10-CM | POA: Insufficient documentation

## 2020-08-31 DIAGNOSIS — O99332 Smoking (tobacco) complicating pregnancy, second trimester: Secondary | ICD-10-CM | POA: Diagnosis not present

## 2020-08-31 LAB — URINALYSIS, ROUTINE W REFLEX MICROSCOPIC
Bilirubin Urine: NEGATIVE
Glucose, UA: NEGATIVE mg/dL
Hgb urine dipstick: NEGATIVE
Ketones, ur: NEGATIVE mg/dL
Leukocytes,Ua: NEGATIVE
Nitrite: NEGATIVE
Protein, ur: NEGATIVE mg/dL
Specific Gravity, Urine: 1.013 (ref 1.005–1.030)
pH: 7 (ref 5.0–8.0)

## 2020-08-31 LAB — WET PREP, GENITAL
Clue Cells Wet Prep HPF POC: NONE SEEN
Sperm: NONE SEEN
Trich, Wet Prep: NONE SEEN
Yeast Wet Prep HPF POC: NONE SEEN

## 2020-08-31 MED ORDER — PROMETHAZINE HCL 25 MG/ML IJ SOLN
25.0000 mg | Freq: Once | INTRAVENOUS | Status: AC
  Administered 2020-08-31: 25 mg via INTRAVENOUS
  Filled 2020-08-31: qty 1

## 2020-08-31 MED ORDER — M.V.I. ADULT IV INJ
Freq: Once | INTRAVENOUS | Status: AC
  Filled 2020-08-31: qty 1000

## 2020-08-31 MED ORDER — FAMOTIDINE IN NACL 20-0.9 MG/50ML-% IV SOLN
20.0000 mg | Freq: Once | INTRAVENOUS | Status: AC
  Administered 2020-08-31: 20 mg via INTRAVENOUS
  Filled 2020-08-31: qty 50

## 2020-08-31 MED ORDER — HYDROXYPROGESTERONE CAPROATE 275 MG/1.1ML ~~LOC~~ SOAJ
275.0000 mg | Freq: Once | SUBCUTANEOUS | Status: AC
  Administered 2020-08-31: 275 mg via SUBCUTANEOUS

## 2020-08-31 NOTE — MAU Provider Note (Signed)
History     CSN: [id number]  Arrival date and time: 08/31/20 1649   Event Date/Time   First Provider Initiated Contact with Patient 08/31/20 1753      Chief Complaint  Patient presents with  . Nausea  . Emesis   Ms. Leslie Yates is a 30 y.o. year old G60P0101 female at [redacted]w[redacted]d weeks gestation who presents to MAU reporting she has had N/V since eating at [facility] on Monday night. She reports "feeling really bad with abdominal cramping about 3-45 minutes after eating the food Monday night. She reports going to work on Tuesday, but having a hard time while working. She states, she was not able to go to work today. She reports being "on hands and knees vomiting all day. Not able to keep anything down all day." She also complains of diarrhea since this morning. She also reports having increased swelling and pain in groin area. She has seen a dermatologist at [facility] for hidradenitis in groin area; taking Clindamycin. The FOB, [name], is present and contributing to the history taking.   OB History    Gravida  2   Para  1   Term  0   Preterm  1   AB  0   Living  1     SAB  0   IAB  0   Ectopic  0   Multiple  0   Live Births  1           Past Medical History:  Diagnosis Date  . Asthma   . Cyclical vomiting with nausea 10/27/2018   ED visits for vomiting and diarrhea since 2009-13 At least 4-5 references witnessing patient inducing vomiting by sticking fingers down throat Has had difficulty obtaining meds through the years GI consult made but never went  . Elevated liver enzymes 11/19/2019  . Gestational hypertension 11/17/2019   IOL @ 37 weeks  . Hyperemesis gravidarum 08/22/2019  . Lump of breast 06/12/2019  . Obesity   . Ovarian cyst   . Sickle cell trait (HCC)   . 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 Supervision of high risk pregnancy, antepartum 06/01/2019    Nursing Staff Provider Office  Location  [facility] Dating   Language  English Anatomy US  Normal  Flu Vaccine   Genetic Screen  NIPS:    AFP: screen negative   TDaP vaccine    Hgb A1C or  GTT Early  Third trimester  Rhogam     LAB RESULTS  Feeding Plan Breast Blood Type O/Positive/-- (12/18 1029)  Contraception Pills Antibody Negative (12/18 1029) Circumcision If boy, yes Rubella 1.74 (12/18 102    Past Surgical History:  Procedure Laterality Date  . INCISION AND DRAINAGE ABSCESS      Family History  Problem Relation Age of Onset  . Diabetes Mother   . Hypertension Mother   . Diabetes Father   . Hypertension Father     Social History   Tobacco Use  . Smoking status: Current Every Day Smoker    Packs/day: 1.00    Years: 11.00    Pack years: 11.00    Types: Cigarettes  . Smokeless tobacco: Never Used  Vaping Use  . Vaping Use: Never used  Substance Use Topics  . Alcohol use: Not Currently    Comment: occ  . Drug use: Not Currently    Types: Benzodiazepines, Marijuana    Comment: States she  never fill prescription, last time marijuana use yesterday    Allergies:  Allergies  Allergen Reactions  . Penicillins Rash    Childhood rash Has patient had a PCN reaction causing immediate rash, facial/tongue/throat swelling, SOB or lightheadedness with hypotension: no Has patient had a PCN reaction causing severe rash involving mucus membranes or skin necrosis:no Has patient had a PCN reaction that required hospitalization no Has patient had a PCN reaction occurring within the last 10 years: no If all of the above answers are "NO", then may proceed with Cephalosporin use.     Medications Prior to Admission  Medication Sig Dispense Refill Last Dose  . aspirin EC 81 MG tablet Take 1 tablet (81 mg total) by mouth daily. 60 tablet 2 08/30/2020 at Unknown time  . docusate sodium (COLACE) 100 MG capsule Take 1 capsule (100 mg total) by mouth daily. 30 capsule 2 08/30/2020 at Unknown time  . Doxylamine-Pyridoxine  10-10 MG TBEC Take 2 tablets by mouth at bedtime. 60 tablet 2 08/30/2020 at Unknown time  . famotidine (PEPCID) 20 MG tablet Take 1 tablet (20 mg total) by mouth daily. 60 tablet 2 08/30/2020 at Unknown time  . ondansetron (ZOFRAN ODT) 4 MG disintegrating tablet Take 1 tablet (4 mg total) by mouth every 8 (eight) hours as needed for nausea or vomiting. 30 tablet 0 Past Week at Unknown time  . polyethylene glycol (MIRALAX) 17 g packet Take 17 g by mouth daily. Until bowel movement successful. 14 each 0 08/30/2020 at Unknown time  . Prenatal 27-1 MG TABS Take 1 tablet by mouth daily. 30 tablet 12 08/30/2020 at Unknown time  . promethazine (PHENERGAN) 12.5 MG tablet Take 1 tablet (12.5 mg total) by mouth every 6 (six) hours as needed for nausea or vomiting. 30 tablet 0 Past Week at Unknown time  . albuterol (VENTOLIN HFA) 108 (90 Base) MCG/ACT inhaler Inhale 2 puffs into the lungs every 6 (six) hours as needed for wheezing or shortness of breath. 18 g 1 More than a month at Unknown time  . Blood Pressure Monitoring (BLOOD PRESSURE MONITOR AUTOMAT) DEVI 1 Device by Does not apply route daily. Automatic blood pressure cuff regular/x-large size cuff. To monitor blood pressure regularly at home. ICD-10 code: O09.90. 1 each 0   . fluticasone (FLOVENT HFA) 110 MCG/ACT inhaler Inhale 2 puffs into the lungs in the morning and at bedtime. 12 g 12   . metoCLOPramide (REGLAN) 10 MG tablet Take 1 tablet (10 mg total) by mouth every 6 (six) hours. 30 tablet 0   . potassium chloride 20 MEQ TBCR Take 20 mEq by mouth daily. 14 tablet 1   . terconazole (TERAZOL 7) 0.4 % vaginal cream Place 1 applicator vaginally at bedtime. Use for seven days 45 g 0     Review of Systems  Constitutional: Positive for appetite change.  HENT: Negative.   Eyes: Negative.   Respiratory: Negative.   Cardiovascular: Negative.   Gastrointestinal: Positive for diarrhea, nausea and vomiting.  Endocrine: Negative.   Genitourinary:       Groin  pain, swelling of hidradenitis suppurativa - currently taking Clindamycin  Musculoskeletal: Negative.   Skin: Negative.   Allergic/Immunologic: Negative.   Neurological: Negative.   Hematological: Negative.   Psychiatric/Behavioral: Negative.    Physical Exam   Blood pressure (!) 117/57, pulse 75, temperature 98.2 F (36.8 C), temperature source Oral, resp. rate 19, height 6\' 1"  (1.854 m), weight 118.3 kg, last menstrual period 04/08/2020, SpO2 100 %, not currently  breastfeeding.  Physical Exam Vitals and nursing note reviewed.  Constitutional:      Appearance: Normal appearance. She is obese.  Abdominal:     Palpations: Abdomen is soft.  Genitourinary:    General: Normal vulva.     Comments: 5 cm x 5 cm firm, painful to the touch, round area around hidradenitis suppurativa lesion; wet prep and GC/CT swabs collected by blind swab by RN Musculoskeletal:        General: Normal range of motion.  Skin:    General: Skin is warm and dry.  Neurological:     Mental Status: She is alert and oriented to person, place, and time.  Psychiatric:        Mood and Affect: Mood normal.        Behavior: Behavior normal.        Thought Content: Thought content normal.        Judgment: Judgment normal.     MAU Course  Procedures  MDM IVFs: Phenergan 25 mg in LR 1000 ml @ 999 ml/hr; followed by MVI in LR 1000 ml @ 999 ml/hr -- No nausea/vomiting PO Challenge -- patient tolerated well  Reassessment @ 1945: Patient eating broccoli and cheese soup (2nd bowl per patient) and bread from [employer] while Phenergan IVF infusing - no nausea or vomiting   Results for orders placed or performed during the hospital encounter of 08/31/20 (from the past 24 hour(s))  Urinalysis, Routine w reflex microscopic Urine, Clean Catch     Status: None   Collection Time: 08/31/20  5:54 PM  Result Value Ref Range   Color, Urine YELLOW YELLOW   APPearance CLEAR CLEAR   Specific Gravity, Urine 1.013 1.005 - 1.030   pH  7.0 5.0 - 8.0   Glucose, UA NEGATIVE NEGATIVE mg/dL   Hgb urine dipstick NEGATIVE NEGATIVE   Bilirubin Urine NEGATIVE NEGATIVE   Ketones, ur NEGATIVE NEGATIVE mg/dL   Protein, ur NEGATIVE NEGATIVE mg/dL   Nitrite NEGATIVE NEGATIVE   Leukocytes,Ua NEGATIVE NEGATIVE  Wet prep, genital     Status: Abnormal   Collection Time: 08/31/20  6:30 PM   Specimen: PATH Cytology Cervicovaginal Ancillary Only  Result Value Ref Range   Yeast Wet Prep HPF POC NONE SEEN NONE SEEN   Trich, Wet Prep NONE SEEN NONE SEEN   Clue Cells Wet Prep HPF POC NONE SEEN NONE SEEN   WBC, Wet Prep HPF POC MANY (A) NONE SEEN   Sperm NONE SEEN     Assessment and Plan  Gastroenteritis - Plan: Discharge patient - Continue using Zofran at home - Drink plenty of fluids throughout the day - Slowly advance diet - Ok to go to work tomorrow - Advised to [employer] for an appt to look at her groin area - Keep scheduled appts with [facility] - Patient verbalized an understanding of the plan of care and agrees.     [name], CNM 08/31/2020, 5:53 PM

## 2020-08-31 NOTE — MAU Note (Addendum)
Presents with c/o possible food poisoning.  Ate [employer] on Monday and within 30-45 minutes later began having abdominal cramping.  Reports been having N/V and diarrhea since this morning.  States also having nosebleeds each time emesis, only right nare. Also states has skin condition in "private" area and left groin is swollen and sore to touch.  States having vaginal itching and odor too. Denies VB or LOF.

## 2020-08-31 NOTE — Progress Notes (Signed)
    S: Patient in clinic for Makena 275 mg/1.1 mL single use-Auto-injector for history of preterm labor/delivery. Patient is 100w4d gestation.   O: Need for Makena injection.  A: Makena 275 mg/1.1 ml subcutaneous given right arm. Patient tolerated injection. Injection site unremarkable. No unusual complaints today.  P: Next injection is due 09/08/2020.  Clovis Pu, RN

## 2020-08-31 NOTE — Discharge Instructions (Signed)
Take Zofran as previously prescribed

## 2020-09-01 DIAGNOSIS — L732 Hidradenitis suppurativa: Secondary | ICD-10-CM | POA: Diagnosis not present

## 2020-09-01 LAB — GC/CHLAMYDIA PROBE AMP (~~LOC~~) NOT AT ARMC
Chlamydia: NEGATIVE
Comment: NEGATIVE
Comment: NORMAL
Neisseria Gonorrhea: NEGATIVE

## 2020-09-08 ENCOUNTER — Ambulatory Visit: Payer: Medicaid Other | Attending: Maternal & Fetal Medicine

## 2020-09-08 ENCOUNTER — Ambulatory Visit (INDEPENDENT_AMBULATORY_CARE_PROVIDER_SITE_OTHER): Payer: Medicaid Other | Admitting: *Deleted

## 2020-09-08 ENCOUNTER — Other Ambulatory Visit: Payer: Self-pay

## 2020-09-08 ENCOUNTER — Encounter: Payer: Self-pay | Admitting: *Deleted

## 2020-09-08 ENCOUNTER — Other Ambulatory Visit: Payer: Self-pay | Admitting: *Deleted

## 2020-09-08 ENCOUNTER — Ambulatory Visit: Payer: Medicaid Other | Admitting: *Deleted

## 2020-09-08 VITALS — BP 131/68 | HR 67 | Temp 98.3°F | Wt 260.4 lb

## 2020-09-08 DIAGNOSIS — O09212 Supervision of pregnancy with history of pre-term labor, second trimester: Secondary | ICD-10-CM

## 2020-09-08 DIAGNOSIS — O0992 Supervision of high risk pregnancy, unspecified, second trimester: Secondary | ICD-10-CM

## 2020-09-08 DIAGNOSIS — O0990 Supervision of high risk pregnancy, unspecified, unspecified trimester: Secondary | ICD-10-CM

## 2020-09-08 DIAGNOSIS — Q210 Ventricular septal defect: Secondary | ICD-10-CM

## 2020-09-08 DIAGNOSIS — O09899 Supervision of other high risk pregnancies, unspecified trimester: Secondary | ICD-10-CM

## 2020-09-08 DIAGNOSIS — Z3A20 20 weeks gestation of pregnancy: Secondary | ICD-10-CM

## 2020-09-08 MED ORDER — HYDROXYPROGESTERONE CAPROATE 275 MG/1.1ML ~~LOC~~ SOAJ
275.0000 mg | SUBCUTANEOUS | Status: DC
  Administered 2020-09-08 – 2020-09-15 (×2): 275 mg via SUBCUTANEOUS

## 2020-09-08 NOTE — Progress Notes (Signed)
     S: Patient in clinic for Makena 275 mg/1.1 mL single use-Auto-injector for history of preterm labor/delivery. Patient is [redacted]w[redacted]d gestation.   O: Need for Makena injection.  A: Makena 275 mg/1.1 ml subcutaneous given left arm. Patient tolerated injection. Injection site unremarkable. No unusual complaints today.  P: Next injection is due 09/22/20.  Clovis Pu, RN

## 2020-09-12 ENCOUNTER — Encounter: Payer: Self-pay | Admitting: *Deleted

## 2020-09-15 ENCOUNTER — Ambulatory Visit (INDEPENDENT_AMBULATORY_CARE_PROVIDER_SITE_OTHER): Payer: Medicaid Other | Admitting: *Deleted

## 2020-09-15 ENCOUNTER — Other Ambulatory Visit: Payer: Self-pay

## 2020-09-15 VITALS — BP 108/67 | HR 75 | Temp 98.2°F | Ht 70.0 in | Wt 262.2 lb

## 2020-09-15 DIAGNOSIS — Z8751 Personal history of pre-term labor: Secondary | ICD-10-CM

## 2020-09-15 DIAGNOSIS — O09899 Supervision of other high risk pregnancies, unspecified trimester: Secondary | ICD-10-CM | POA: Diagnosis not present

## 2020-09-15 DIAGNOSIS — R3989 Other symptoms and signs involving the genitourinary system: Secondary | ICD-10-CM

## 2020-09-15 LAB — POCT URINALYSIS DIPSTICK (MANUAL)
Leukocytes, UA: NEGATIVE
Nitrite, UA: NEGATIVE
Poct Bilirubin: NEGATIVE
Poct Blood: NEGATIVE
Poct Glucose: NORMAL mg/dL
Poct Ketones: NEGATIVE
Poct Urobilinogen: 1 mg/dL — AB
Spec Grav, UA: 1.02 (ref 1.010–1.025)
pH, UA: 7 (ref 5.0–8.0)

## 2020-09-15 NOTE — Progress Notes (Signed)
   S: Patient in clinic for Makena 275 mg/1.1 mL single use-Auto-injector for history of preterm labor/delivery. Patient is [redacted]w[redacted]d gestation.  Patient also complains of dark color urine for 3-4 days. Denies dysuria, frequency, urgency. Patient reported she has taken 3 enemas in the past three days with some relief.   O: Need for Makena injection. Appears well, in no apparent distress.  Vital signs are normal. Urine dipstick shows positive for protein.    A: Makena 275 mg/1.1 ml subcutaneous given right arm. Patient tolerated injection. Injection site unremarkable. No unusual complaints today.  P: Next injection is due 09/22/2020. Treatment per orders.  Call or return to clinic prn if these symptoms worsen or fail to improve as anticipated. Miralax samples given to help with bowel movement.    Clovis Pu, RN

## 2020-09-16 ENCOUNTER — Inpatient Hospital Stay (HOSPITAL_COMMUNITY): Payer: Medicaid Other

## 2020-09-16 ENCOUNTER — Other Ambulatory Visit: Payer: Self-pay

## 2020-09-16 ENCOUNTER — Inpatient Hospital Stay (HOSPITAL_COMMUNITY)
Admission: AD | Admit: 2020-09-16 | Discharge: 2020-09-16 | Disposition: A | Discharge status: Home / Self Care | Payer: Medicaid Other | Attending: Obstetrics and Gynecology | Admitting: Obstetrics and Gynecology

## 2020-09-16 ENCOUNTER — Encounter (HOSPITAL_COMMUNITY): Payer: Self-pay

## 2020-09-16 ENCOUNTER — Emergency Department (HOSPITAL_COMMUNITY): Payer: Medicaid Other

## 2020-09-16 DIAGNOSIS — O99512 Diseases of the respiratory system complicating pregnancy, second trimester: Secondary | ICD-10-CM | POA: Diagnosis not present

## 2020-09-16 DIAGNOSIS — O2242 Hemorrhoids in pregnancy, second trimester: Secondary | ICD-10-CM | POA: Diagnosis not present

## 2020-09-16 DIAGNOSIS — N83201 Unspecified ovarian cyst, right side: Secondary | ICD-10-CM

## 2020-09-16 DIAGNOSIS — R1032 Left lower quadrant pain: Secondary | ICD-10-CM | POA: Insufficient documentation

## 2020-09-16 DIAGNOSIS — O3482 Maternal care for other abnormalities of pelvic organs, second trimester: Secondary | ICD-10-CM | POA: Diagnosis not present

## 2020-09-16 DIAGNOSIS — Z3A21 21 weeks gestation of pregnancy: Secondary | ICD-10-CM | POA: Diagnosis not present

## 2020-09-16 DIAGNOSIS — O09292 Supervision of pregnancy with other poor reproductive or obstetric history, second trimester: Secondary | ICD-10-CM

## 2020-09-16 DIAGNOSIS — I959 Hypotension, unspecified: Secondary | ICD-10-CM | POA: Diagnosis not present

## 2020-09-16 DIAGNOSIS — O99612 Diseases of the digestive system complicating pregnancy, second trimester: Secondary | ICD-10-CM

## 2020-09-16 DIAGNOSIS — K649 Unspecified hemorrhoids: Secondary | ICD-10-CM | POA: Diagnosis not present

## 2020-09-16 DIAGNOSIS — I10 Essential (primary) hypertension: Secondary | ICD-10-CM | POA: Diagnosis not present

## 2020-09-16 DIAGNOSIS — J45909 Unspecified asthma, uncomplicated: Secondary | ICD-10-CM | POA: Diagnosis not present

## 2020-09-16 DIAGNOSIS — F17210 Nicotine dependence, cigarettes, uncomplicated: Secondary | ICD-10-CM | POA: Diagnosis not present

## 2020-09-16 DIAGNOSIS — R110 Nausea: Secondary | ICD-10-CM | POA: Diagnosis not present

## 2020-09-16 DIAGNOSIS — O26892 Other specified pregnancy related conditions, second trimester: Secondary | ICD-10-CM | POA: Diagnosis not present

## 2020-09-16 DIAGNOSIS — K5900 Constipation, unspecified: Secondary | ICD-10-CM

## 2020-09-16 DIAGNOSIS — O09892 Supervision of other high risk pregnancies, second trimester: Secondary | ICD-10-CM

## 2020-09-16 DIAGNOSIS — Z7982 Long term (current) use of aspirin: Secondary | ICD-10-CM | POA: Insufficient documentation

## 2020-09-16 DIAGNOSIS — Z79899 Other long term (current) drug therapy: Secondary | ICD-10-CM | POA: Diagnosis not present

## 2020-09-16 DIAGNOSIS — N83209 Unspecified ovarian cyst, unspecified side: Secondary | ICD-10-CM

## 2020-09-16 DIAGNOSIS — R1084 Generalized abdominal pain: Secondary | ICD-10-CM | POA: Diagnosis not present

## 2020-09-16 DIAGNOSIS — R188 Other ascites: Secondary | ICD-10-CM | POA: Diagnosis not present

## 2020-09-16 DIAGNOSIS — Z3A22 22 weeks gestation of pregnancy: Secondary | ICD-10-CM

## 2020-09-16 DIAGNOSIS — O0990 Supervision of high risk pregnancy, unspecified, unspecified trimester: Secondary | ICD-10-CM

## 2020-09-16 DIAGNOSIS — R1013 Epigastric pain: Secondary | ICD-10-CM | POA: Diagnosis not present

## 2020-09-16 DIAGNOSIS — O132 Gestational [pregnancy-induced] hypertension without significant proteinuria, second trimester: Secondary | ICD-10-CM | POA: Insufficient documentation

## 2020-09-16 DIAGNOSIS — O99332 Smoking (tobacco) complicating pregnancy, second trimester: Secondary | ICD-10-CM | POA: Diagnosis not present

## 2020-09-16 DIAGNOSIS — R1011 Right upper quadrant pain: Secondary | ICD-10-CM

## 2020-09-16 DIAGNOSIS — I499 Cardiac arrhythmia, unspecified: Secondary | ICD-10-CM | POA: Diagnosis not present

## 2020-09-16 HISTORY — DX: Supervision of pregnancy with other poor reproductive or obstetric history, second trimester: O09.292

## 2020-09-16 HISTORY — DX: Supervision of other high risk pregnancies, second trimester: O09.892

## 2020-09-16 LAB — COMPREHENSIVE METABOLIC PANEL
ALT: 11 U/L (ref 0–44)
AST: 16 U/L (ref 15–41)
Albumin: 3.2 g/dL — ABNORMAL LOW (ref 3.5–5.0)
Alkaline Phosphatase: 32 U/L — ABNORMAL LOW (ref 38–126)
Anion gap: 7 (ref 5–15)
BUN: 5 mg/dL — ABNORMAL LOW (ref 6–20)
CO2: 21 mmol/L — ABNORMAL LOW (ref 22–32)
Calcium: 8.2 mg/dL — ABNORMAL LOW (ref 8.9–10.3)
Chloride: 108 mmol/L (ref 98–111)
Creatinine, Ser: 0.66 mg/dL (ref 0.44–1.00)
GFR, Estimated: >60 mL/min (ref >60)
Glucose, Bld: 94 mg/dL (ref 70–99)
Potassium: 3.4 mmol/L — ABNORMAL LOW (ref 3.5–5.1)
Sodium: 136 mmol/L (ref 135–145)
Total Bilirubin: 0.4 mg/dL (ref 0.3–1.2)
Total Protein: 6.1 g/dL — ABNORMAL LOW (ref 6.5–8.1)

## 2020-09-16 LAB — CBC
HCT: 31.5 % — ABNORMAL LOW (ref 36.0–46.0)
Hemoglobin: 11.1 g/dL — ABNORMAL LOW (ref 12.0–15.0)
MCH: 32.2 pg (ref 26.0–34.0)
MCHC: 35.2 g/dL (ref 30.0–36.0)
MCV: 91.3 fL (ref 80.0–100.0)
Platelets: 184 10*3/uL (ref 150–400)
RBC: 3.45 MIL/uL — ABNORMAL LOW (ref 3.87–5.11)
RDW: 11.7 % (ref 11.5–15.5)
WBC: 11.2 10*3/uL — ABNORMAL HIGH (ref 4.0–10.5)
nRBC: 0 % (ref 0.0–0.2)

## 2020-09-16 LAB — URINALYSIS, ROUTINE W REFLEX MICROSCOPIC
Bilirubin Urine: NEGATIVE
Glucose, UA: NEGATIVE mg/dL
Hgb urine dipstick: NEGATIVE
Ketones, ur: NEGATIVE mg/dL
Leukocytes,Ua: NEGATIVE
Nitrite: NEGATIVE
Protein, ur: NEGATIVE mg/dL
Specific Gravity, Urine: 1.014 (ref 1.005–1.030)
pH: 7 (ref 5.0–8.0)

## 2020-09-16 LAB — I-STAT BETA HCG BLOOD, ED (MC, WL, AP ONLY): I-stat hCG, quantitative: >2000 m[IU]/mL — ABNORMAL HIGH (ref <5)

## 2020-09-16 LAB — LIPASE, BLOOD: Lipase: 27 U/L (ref 11–51)

## 2020-09-16 MED ORDER — PROMETHAZINE HCL 25 MG PO TABS: 25.0000 mg | ORAL_TABLET | Freq: Four times a day (QID) | ORAL | 0 refills | Status: DC | PRN

## 2020-09-16 MED ORDER — DIBUCAINE (PERIANAL) 1 % EX OINT: 1.0000 "application " | TOPICAL_OINTMENT | CUTANEOUS | 1 refills | Status: DC | PRN

## 2020-09-16 MED ORDER — SODIUM CHLORIDE 0.9 % IV SOLN
25.0000 mg | Freq: Once | INTRAVENOUS | Status: AC
  Administered 2020-09-16: 25 mg via INTRAVENOUS
  Filled 2020-09-16: qty 1

## 2020-09-16 MED ORDER — DOCUSATE SODIUM 100 MG PO CAPS: 100.0000 mg | ORAL_CAPSULE | Freq: Two times a day (BID) | ORAL | 2 refills | Status: DC

## 2020-09-16 MED ORDER — HYDROMORPHONE HCL 1 MG/ML IJ SOLN
0.5000 mg | Freq: Once | INTRAMUSCULAR | Status: AC
  Administered 2020-09-16: 0.5 mg via INTRAVENOUS
  Filled 2020-09-16: qty 1

## 2020-09-16 NOTE — ED Notes (Signed)
Pt requests to go to MAU, Dr Effie Shy notified

## 2020-09-16 NOTE — Discharge Instructions (Signed)
You have constipation which is hard stools that are difficult to pass. It is important to have regular bowel movements every 1-3 days that are soft and easy to pass. Hard stools increase your risk of hemorrhoids and are very uncomfortable.   To prevent constipation you can increase the amount of fiber in your diet. Examples of foods with fiber are leafy greens, whole grain breads, oatmeal and other grains.  It is also important to drink at least eight 8oz glass of water everyday.   If you have not has a bowel movement in 4-5 days you made need to clean out your bowel.  This will have establish normal movement through your bowel.    Miralax Clean out  Take 8 capfuls of miralax in 64 oz of gatorade. You can use any fluid that appeals to you (gatorade, water, juice)  Continue to drink at least eight 8 oz glasses of water throughout the day  You can repeat with another 8 capfuls of miralax in 64 oz of gatorade if you are not having a large amount of stools  You will need to be at home and close to a bathroom for about 8 hours when you do the above as you may need to go to the bathroom frequently.   After you are cleaned out: - Start Colace100mg  twice daily - Start Miralax once daily - Start a daily fiber supplement like metamucil or citrucel - You can safely use enemas in pregnancy  - if you are having diarrhea you can reduce to Colace once a day or miralax every other day or a 1/2 capful daily.       Constipation, Adult Constipation is when a person has fewer than three bowel movements in a week, has difficulty having a bowel movement, or has stools (feces) that are dry, hard, or larger than normal. Constipation may be caused by an underlying condition. It may become worse with age if a person takes certain medicines and does not take in enough fluids. Follow these instructions at home: Eating and drinking  Eat foods that have a lot of fiber, such as beans, whole grains, and fresh fruits  and vegetables.  Limit foods that are low in fiber and high in fat and processed sugars, such as fried or sweet foods. These include french fries, hamburgers, cookies, candies, and soda.  Drink enough fluid to keep your urine pale yellow.   General instructions  Exercise regularly or as told by your health care provider. Try to do 150 minutes of moderate exercise each week.  Use the bathroom when you have the urge to go. Do not hold it in.  Take over-the-counter and prescription medicines only as told by your health care provider. This includes any fiber supplements.  During bowel movements: ? Practice deep breathing while relaxing the lower abdomen. ? Practice pelvic floor relaxation.  Watch your condition for any changes. Let your health care provider know about them.  Keep all follow-up visits as told by your health care provider. This is important. Contact a health care provider if:  You have pain that gets worse.  You have a fever.  You do not have a bowel movement after 4 days.  You vomit.  You are not hungry or you lose weight.  You are bleeding from the opening between the buttocks (anus).  You have thin, pencil-like stools. Get help right away if:  You have a fever and your symptoms suddenly get worse.  You leak stool or  have blood in your stool.  Your abdomen is bloated.  You have severe pain in your abdomen.  You feel dizzy or you faint. Summary  Constipation is when a person has fewer than three bowel movements in a week, has difficulty having a bowel movement, or has stools (feces) that are dry, hard, or larger than normal.  Eat foods that have a lot of fiber, such as beans, whole grains, and fresh fruits and vegetables.  Drink enough fluid to keep your urine pale yellow.  Take over-the-counter and prescription medicines only as told by your health care provider. This includes any fiber supplements. This information is not intended to replace advice  given to you by your health care provider. Make sure you discuss any questions you have with your health care provider. Document Revised: 04/29/2019 Document Reviewed: 04/29/2019 Elsevier Patient Education  2021 [employer].

## 2020-09-16 NOTE — ED Provider Notes (Signed)
[facility] Provider Note   CSN: [id number] Arrival date & time: 09/16/20  [id number]     History Chief Complaint  Patient presents with  . Abdominal Pain    Fluctuating blood pressure and vomitting, upper right quad pain    Leslie Yates is a 30 y.o. female.  HPI She reports onset of abdominal pain, right-sided, on and off for 1 week associated with episodes of vomiting.  She also has constipation and hemorrhoids that are painful.  She is [redacted] weeks pregnant, currently receiving hydroxy progesterone, injections because of her history of prior preterm labor.  Last injection was yesterday.  She denies fever, chills, weakness or dizziness.  The patient denies uterine cramping, vaginal discharge or bleeding.  There are no other known modifying factors.    Past Medical History:  Diagnosis Date  . Asthma   . Cyclical vomiting with nausea 10/27/2018   ED visits for vomiting and diarrhea since 2009-13 At least 4-5 references witnessing patient inducing vomiting by sticking fingers down throat Has had difficulty obtaining meds through the years GI consult made but never went  . Elevated liver enzymes 11/19/2019  . Gestational hypertension 11/17/2019   IOL @ 37 weeks  . Hyperemesis gravidarum 08/22/2019  . Lump of breast 06/12/2019  . Obesity   . Ovarian cyst   . Pregnant   . Sickle cell trait (HCC)   . 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 Supervision of high risk pregnancy, antepartum 06/01/2019    Nursing Staff Provider Office Location  [facility] Dating   Language  English Anatomy US  Normal  Flu Vaccine   Genetic Screen  NIPS:    AFP: screen negative   TDaP vaccine    Hgb A1C or  GTT Early  Third trimester  Rhogam     LAB RESULTS  Feeding Plan Breast Blood Type O/Positive/-- (12/18 1029)  Contraception Pills Antibody Negative (12/18 1029) Circumcision If boy, yes Rubella 1.74 (12/18 102     Patient Active Problem List   Diagnosis Date Noted  . Supervision of high risk pregnancy, antepartum 05/23/2020  . Seizure (HCC) 11/16/2019  . Carrier of disease 07/01/2019  . Sickle cell trait (HCC)   . Obesity in pregnancy, antepartum 06/12/2019  . Tobacco use 10/28/2018  . Asthma 10/27/2018  . Complex cyst of right ovary 05/10/2017    Past Surgical History:  Procedure Laterality Date  . INCISION AND DRAINAGE ABSCESS       OB History    Gravida  2   Para  1   Term  0   Preterm  1   AB  0   Living  1     SAB  0   IAB  0   Ectopic  0   Multiple  0   Live Births  1           Family History  Problem Relation Age of Onset  . Diabetes Mother   . Hypertension Mother   . Diabetes Father   . Hypertension Father     Social History   Tobacco Use  . Smoking status: Current Every Day Smoker    Packs/day: 1.00    Years: 11.00    Pack years: 11.00    Types: Cigarettes  . Smokeless tobacco: Never Used  Vaping Use  . Vaping Use: Never used  Substance Use Topics  . Alcohol use: Not Currently  Comment: occ  . Drug use: Not Currently    Types: Benzodiazepines, Marijuana    Comment: States she never fill prescription, last time marijuana use yesterday    Home Medications Prior to Admission medications   Medication Sig Start Date End Date Taking? Authorizing Provider  albuterol (VENTOLIN HFA) 108 (90 Base) MCG/ACT inhaler Inhale 2 puffs into the lungs every 6 (six) hours as needed for wheezing or shortness of breath. 04/22/20   [name], NP  aspirin EC 81 MG tablet Take 1 tablet (81 mg total) by mouth daily. 07/25/20   Gerrit Heck, CNM  Blood Pressure Monitoring (BLOOD PRESSURE MONITOR AUTOMAT) DEVI 1 Device by Does not apply route daily. Automatic blood pressure cuff regular/x-large size cuff. To monitor blood pressure regularly at home. ICD-10 code: O09.90. 06/29/19   Reva Bores, MD  docusate sodium (COLACE) 100 MG capsule Take 1  capsule (100 mg total) by mouth daily. 07/06/20   Gerrit Heck, CNM  Doxylamine-Pyridoxine 10-10 MG TBEC Take 2 tablets by mouth at bedtime. 07/06/20   Gerrit Heck, CNM  famotidine (PEPCID) 20 MG tablet Take 1 tablet (20 mg total) by mouth daily. 07/06/20   Gerrit Heck, CNM  fluticasone (FLOVENT HFA) 110 MCG/ACT inhaler Inhale 2 puffs into the lungs in the morning and at bedtime. 04/22/20   [name], NP  metoCLOPramide (REGLAN) 10 MG tablet Take 1 tablet (10 mg total) by mouth every 6 (six) hours. 08/04/20   [name], CNM  ondansetron (ZOFRAN ODT) 4 MG disintegrating tablet Take 1 tablet (4 mg total) by mouth every 8 (eight) hours as needed for nausea or vomiting. 08/04/20   [name], CNM  polyethylene glycol (MIRALAX) 17 g packet Take 17 g by mouth daily. Until bowel movement successful. 07/06/20   Gerrit Heck, CNM  potassium chloride 20 MEQ TBCR Take 20 mEq by mouth daily. 07/06/20   Gerrit Heck, CNM  Prenatal 27-1 MG TABS Take 1 tablet by mouth daily. 08/01/20   [name], CNM  promethazine (PHENERGAN) 12.5 MG tablet Take 1 tablet (12.5 mg total) by mouth every 6 (six) hours as needed for nausea or vomiting. Patient not taking: Reported on 09/08/2020 07/09/20   Gerrit Heck, CNM  terconazole (TERAZOL 7) 0.4 % vaginal cream Place 1 applicator vaginally at bedtime. Use for seven days 07/06/20   Gerrit Heck, CNM  pantoprazole (PROTONIX) 40 MG tablet Take 1 tablet (40 mg total) by mouth daily. 10/30/18 04/22/19  Marguerita Merles Latif, DO    Allergies    Penicillins  Review of Systems   Review of Systems  All other systems reviewed and are negative.   Physical Exam Updated Vital Signs BP (!) 107/58   Pulse 61   Temp 97.7 F (36.5 C) (Oral)   Resp (!) 27   Ht 6\' 1"  (1.854 m)   Wt 118.8 kg   LMP 04/08/2020 (Exact Date)   SpO2 100%   BMI 34.57 kg/m   Physical Exam Vitals and nursing note reviewed.  Constitutional:      Appearance: She is well-developed.   HENT:     Head: Normocephalic and atraumatic.     Right Ear: External ear normal.     Left Ear: External ear normal.  Eyes:     Conjunctiva/sclera: Conjunctivae normal.     Pupils: Pupils are equal, round, and reactive to light.  Neck:     Trachea: Phonation normal.  Cardiovascular:     Rate and Rhythm: Normal rate and regular  rhythm.     Heart sounds: Normal heart sounds.  Pulmonary:     Effort: Pulmonary effort is normal.     Breath sounds: Normal breath sounds.  Abdominal:     General: There is no distension.     Palpations: Abdomen is soft.     Tenderness: There is abdominal tenderness (Epigastric and right upper quadrant, mild.  Gravid, lower abdomen and pelvis are nontender to palpation.).  Musculoskeletal:        General: Normal range of motion.     Cervical back: Normal range of motion and neck supple.  Skin:    General: Skin is warm and dry.  Neurological:     Mental Status: She is alert and oriented to person, place, and time.     Cranial Nerves: No cranial nerve deficit.     Sensory: No sensory deficit.     Motor: No abnormal muscle tone.     Coordination: Coordination normal.  Psychiatric:        Mood and Affect: Mood normal.        Behavior: Behavior normal.        Thought Content: Thought content normal.        Judgment: Judgment normal.     ED Results / Procedures / Treatments   Labs (all labs ordered are listed, but only abnormal results are displayed) Labs Reviewed  COMPREHENSIVE METABOLIC PANEL - Abnormal; Notable for the following components:      Result Value   Potassium 3.4 (*)    CO2 21 (*)    BUN 5 (*)    Calcium 8.2 (*)    Total Protein 6.1 (*)    Albumin 3.2 (*)    Alkaline Phosphatase 32 (*)    All other components within normal limits  CBC - Abnormal; Notable for the following components:   WBC 11.2 (*)    RBC 3.45 (*)    Hemoglobin 11.1 (*)    HCT 31.5 (*)    All other components within normal limits  I-STAT BETA HCG BLOOD, ED  ([facility], [facility], [facility] ONLY) - Abnormal; Notable for the following components:   I-stat hCG, quantitative >2,000.0 (*)    All other components within normal limits  LIPASE, BLOOD  URINALYSIS, ROUTINE W REFLEX MICROSCOPIC    EKG EKG Interpretation  Date/Time:  Friday September 16 2020 09:04:32 EDT Ventricular Rate:  49 PR Interval:    QRS Duration: 99 QT Interval:  456 QTC Calculation: 412 R Axis:   64 Text Interpretation: Sinus bradycardia Borderline low voltage, extremity leads Since last tracing rate slower Otherwise no significant change Confirmed by Mancel Bale [phone]) on 09/16/2020 9:23:56 AM   Radiology No results found.  Procedures Procedures   Medications Ordered in ED Medications - No data to display  ED Course  I have reviewed the triage vital signs and the nursing notes.  Pertinent labs & imaging results that were available during my care of the patient were reviewed by me and considered in my medical decision making (see chart for details).  Clinical Course as of 09/16/20 [id number]  Caleen Essex Sep 16, 2020  0946 I decided to talk to the MAU provider, regarding her care and disposition.  Misty Stanley, the midwife, accepts the patient for care in her unit, at this time, for further evaluation of 22-week pregnancy with history of preterm labor, and epigastric/right upper quadrant abdominal pain. [EW]    Clinical Course User Index [EW] Mancel Bale, MD   MDM Rules/Calculators/A&P  Patient Vitals for the past 24 hrs:  BP Temp Temp src Pulse Resp SpO2 Height Weight  09/16/20 0915 (!) 107/58 -- -- 61 (!) 27 100 % -- --  09/16/20 0900 111/66 -- -- (!) 55 11 94 % -- --  09/16/20 0831 -- -- -- -- -- -- 6\' 1"  (1.854 m) 118.8 kg  09/16/20 0829 (!) 111/56 97.7 F (36.5 C) Oral 69 (!) 22 100 % -- --  09/16/20 0823 -- -- -- -- -- 99 % -- --    9:29 AM Reevaluation with update and discussion. After initial assessment and treatment, an updated evaluation reveals she  remains stable and comfortable at this time.  Findings discussed with the patient and all questions were answered. 09/18/20   Medical Decision Making:  This patient is presenting for evaluation of abdominal pain in pregnancy, which does require a range of treatment options, and is a complaint that involves a moderate risk of morbidity and mortality. The differential diagnoses include constipation, pregnancy complication, gallbladder disease. I decided to review old records, and in summary to 22-week pregnancy, patient presenting for evaluation of upper abdominal pain and constipation.  I did not require additional historical information from anyone.  Clinical Laboratory Tests Ordered, included CBC, Metabolic panel, Pregnancy test and Lipase. Review indicates normal except elevated hCG, white count high, hemoglobin low, potassium low, CO2 low, BUN low, calcium low, total protein low, albumin low, alkaline phosphatase low. Radiologic Tests Ordered, included complete abdomen ultrasound ordered.    Cardiac Monitor Tracing which shows normal sinus rhythm    Critical Interventions-clinical evaluation, laboratory testing, radiography ordered, observation and reassessment  After These Interventions, the Patient was reevaluated and was found stable from the emergency department perspective.  Since the patient has a second trimester pregnancy with history of preterm labor and abdominal discomfort, she will be sent to the obstetric unit/MAU for further evaluation and treatment.  I suspect that the patient's pain is related to constipation however cannot rule out gallbladder disease.  She apparently has hemorrhoids that will require assessment and treatment at the MAU as well.  CRITICAL CARE-no Performed by: Mancel Bale  Nursing Notes Reviewed/ Care Coordinated Applicable Imaging Reviewed Interpretation of Laboratory Data incorporated into ED treatment   The patient has completed emergency  department care and assessment.  She is being transferred to the MAU for further evaluation and treatment.  I anticipate that she will have an abdominal ultrasound today and be assessed and treated for hemorrhoids as well as her pregnancy.  This will be completed in the MAU.  Disposition will be arranged by the provider at the MAU.      Final Clinical Impression(s) / ED Diagnoses Final diagnoses:  Epigastric pain  Constipation, unspecified constipation type  Hemorrhoids, unspecified hemorrhoid type  [redacted] weeks gestation of pregnancy    Rx / DC Orders ED Discharge Orders    None       Mancel Bale, MD 09/16/20 [phone]

## 2020-09-16 NOTE — MAU Note (Signed)
Leslie Yates is a 30 y.o. at [redacted]w[redacted]d here in MAU reporting: constipation for over a week. States she has used enema daily for over a week, does have some bowel movement with the enema. States this AM roommate called 911 for seizure like activity. Having RUQ pain.   Onset of complaint: ongoing  Pain score: 8/10  Vitals:   09/16/20 1000 09/16/20 1057  BP: 114/84 (!) 111/53  Pulse: 72 (!) 58  Resp: (!) 24 18  Temp:  98.1 F (36.7 C)  SpO2: 99% 100%     Lab orders placed from triage: ordered by previous provider

## 2020-09-16 NOTE — ED Triage Notes (Signed)
Right upper quad pain for past week with increase to point of waking her up from her sleep, c/o vomitting and unable to use bathroom due to hemorrhoids and now c/o severe constipation

## 2020-09-16 NOTE — MAU Note (Signed)
Soap suds enema begun at 1910.  Pt tolerated for only a few minutes when she felt like she needed to have BM and asked to stop infusion.  Pt had some BM and then was able to tolerate 1/2 bag total of SS enema.  PT feels like "she's emptying out."  Declines remainder of enema.

## 2020-09-16 NOTE — ED Notes (Signed)
Dr Effie Shy spoke with Misty Stanley in MAU, ok to transfer

## 2020-09-16 NOTE — MAU Provider Note (Signed)
History     CSN: [id number]  Arrival date and time: 09/16/20 4097   Event Date/Time   First Provider Initiated Contact with Patient 09/16/20 1118      Chief Complaint  Patient presents with  . Abdominal Pain  . Constipation  . Hemorrhoids   HPI ORIE CUTTINO is a 30 y.o. G2P0101 at [redacted]w[redacted]d who presents for abdominal pain, constipation, and hemorrhoids. Initial evaluation was done in the [facility] before she was transferred here.  Reports RUQ pain that started this morning. This has been an intermittent issue since the beginning of the pregnancy. Pain is sharp and is associated with n/v. Rates pain 8/10. Hasn't treated symptoms. Nothing makes pain better or worse. Hasn't eaten anything today. Ate beans, macaroni & cheese, and salmon last night.  Reports constipation for several weeks. Takes colace once a day & has been using OTC enemas daily with minimal relief. Last BM was yesterday but wasn't much. Has had hemorrhoids since her last pregnancy but they have worsened recently. Was using tucks pads on them but ran out of them & currently is not using anything to treat them.  Denies fever/chills, lower abdominal pain, dysuria, vaginal bleeding.   OB History    Gravida  2   Para  1   Term  0   Preterm  1   AB  0   Living  1     SAB  0   IAB  0   Ectopic  0   Multiple  0   Live Births  1           Past Medical History:  Diagnosis Date  . Asthma   . Cyclical vomiting with nausea 10/27/2018   ED visits for vomiting and diarrhea since 2009-13 At least 4-5 references witnessing patient inducing vomiting by sticking fingers down throat Has had difficulty obtaining meds through the years GI consult made but never went  . Elevated liver enzymes 11/19/2019  . Gestational hypertension 11/17/2019   IOL @ 37 weeks  . Lump of breast 06/12/2019  . Obesity   . Ovarian cyst   . Sickle cell trait [facility])     Past Surgical History:  Procedure Laterality Date  . INCISION AND DRAINAGE  ABSCESS      Family History  Problem Relation Age of Onset  . Diabetes Mother   . Hypertension Mother   . Diabetes Father   . Hypertension Father     Social History   Tobacco Use  . Smoking status: Current Every Day Smoker    Packs/day: 1.00    Years: 11.00    Pack years: 11.00    Types: Cigarettes  . Smokeless tobacco: Never Used  Vaping Use  . Vaping Use: Never used  Substance Use Topics  . Alcohol use: Not Currently    Comment: occ  . Drug use: Not Currently    Types: Benzodiazepines, Marijuana    Comment: States she never fill prescription, last time marijuana use yesterday    Allergies:  Allergies  Allergen Reactions  . Penicillins Rash    Childhood rash Has patient had a PCN reaction causing immediate rash, facial/tongue/throat swelling, SOB or lightheadedness with hypotension: no Has patient had a PCN reaction causing severe rash involving mucus membranes or skin necrosis:no Has patient had a PCN reaction that required hospitalization no Has patient had a PCN reaction occurring within the last 10 years: no If all of the above answers are "NO", then may proceed with Cephalosporin  use.     Facility-Administered Medications Prior to Admission  Medication Dose Route Frequency Provider Last Rate Last Admin  . HYDROXYprogesterone caproate (Makena) autoinjector 275 mg  275 mg Subcutaneous Q7 days [name], CNM   275 mg at 09/15/20 1609   Medications Prior to Admission  Medication Sig Dispense Refill Last Dose  . aspirin EC 81 MG tablet Take 1 tablet (81 mg total) by mouth daily. 60 tablet 2 09/15/2020 at Unknown time  . Doxylamine-Pyridoxine 10-10 MG TBEC Take 2 tablets by mouth at bedtime. 60 tablet 2 09/15/2020 at Unknown time  . fluticasone (FLOVENT HFA) 110 MCG/ACT inhaler Inhale 2 puffs into the lungs in the morning and at bedtime. 12 g 12 09/15/2020 at Unknown time  . metoCLOPramide (REGLAN) 10 MG tablet Take 1 tablet (10 mg total) by mouth every 6 (six)  hours. 30 tablet 0 09/15/2020 at Unknown time  . ondansetron (ZOFRAN ODT) 4 MG disintegrating tablet Take 1 tablet (4 mg total) by mouth every 8 (eight) hours as needed for nausea or vomiting. 30 tablet 0 09/15/2020 at Unknown time  . polyethylene glycol (MIRALAX) 17 g packet Take 17 g by mouth daily. Until bowel movement successful. 14 each 0 09/15/2020 at Unknown time  . Prenatal 27-1 MG TABS Take 1 tablet by mouth daily. 30 tablet 12 09/15/2020 at Unknown time  . [DISCONTINUED] docusate sodium (COLACE) 100 MG capsule Take 1 capsule (100 mg total) by mouth daily. 30 capsule 2 09/15/2020 at Unknown time  . albuterol (VENTOLIN HFA) 108 (90 Base) MCG/ACT inhaler Inhale 2 puffs into the lungs every 6 (six) hours as needed for wheezing or shortness of breath. 18 g 1 More than a month at Unknown time  . Blood Pressure Monitoring (BLOOD PRESSURE MONITOR AUTOMAT) DEVI 1 Device by Does not apply route daily. Automatic blood pressure cuff regular/x-large size cuff. To monitor blood pressure regularly at home. ICD-10 code: O09.90. 1 each 0   . famotidine (PEPCID) 20 MG tablet Take 1 tablet (20 mg total) by mouth daily. 60 tablet 2   . potassium chloride 20 MEQ TBCR Take 20 mEq by mouth daily. 14 tablet 1   . promethazine (PHENERGAN) 12.5 MG tablet Take 1 tablet (12.5 mg total) by mouth every 6 (six) hours as needed for nausea or vomiting. (Patient not taking: Reported on 09/08/2020) 30 tablet 0   . terconazole (TERAZOL 7) 0.4 % vaginal cream Place 1 applicator vaginally at bedtime. Use for seven days 45 g 0     Review of Systems  Constitutional: Negative.   Gastrointestinal: Positive for abdominal pain, constipation, nausea, rectal pain and vomiting. Negative for anal bleeding, blood in stool and diarrhea.  Genitourinary: Negative.    Physical Exam   Blood pressure (!) 117/54, pulse (!) 57, temperature 98.6 F (37 C), temperature source Oral, resp. rate 18, height 6\' 1"  (1.854 m), weight 118.8 kg, last  menstrual period 04/08/2020, SpO2 97 %, not currently breastfeeding.  Physical Exam Vitals and nursing note reviewed. Exam conducted with a chaperone present.  Constitutional:      General: She is not in acute distress.    Appearance: She is well-developed. She is obese.  HENT:     Head: Normocephalic and atraumatic.  Pulmonary:     Effort: Pulmonary effort is normal. No respiratory distress.  Genitourinary:    Rectum: External hemorrhoid (flesh colored ~0.5 cm x 3) present.  Skin:    General: Skin is warm and dry.  Neurological:  Mental Status: She is alert.  Psychiatric:        Mood and Affect: Mood normal.        Behavior: Behavior normal.     MAU Course  Procedures Results for orders placed or performed during the hospital encounter of 09/16/20 (from the past 24 hour(s))  Lipase, blood     Status: None   Collection Time: 09/16/20  8:48 AM  Result Value Ref Range   Lipase 27 11 - 51 U/L  Comprehensive metabolic panel     Status: Abnormal   Collection Time: 09/16/20  8:48 AM  Result Value Ref Range   Sodium 136 135 - 145 mmol/L   Potassium 3.4 (L) 3.5 - 5.1 mmol/L   Chloride 108 98 - 111 mmol/L   CO2 21 (L) 22 - 32 mmol/L   Glucose, Bld 94 70 - 99 mg/dL   BUN 5 (L) 6 - 20 mg/dL   Creatinine, Ser 1.610.66 0.44 - 1.00 mg/dL   Calcium 8.2 (L) 8.9 - 10.3 mg/dL   Total Protein 6.1 (L) 6.5 - 8.1 g/dL   Albumin 3.2 (L) 3.5 - 5.0 g/dL   AST 16 15 - 41 U/L   ALT 11 0 - 44 U/L   Alkaline Phosphatase 32 (L) 38 - 126 U/L   Total Bilirubin 0.4 0.3 - 1.2 mg/dL   GFR, Estimated >09>60 >60>60 mL/min   Anion gap 7 5 - 15  CBC     Status: Abnormal   Collection Time: 09/16/20  8:48 AM  Result Value Ref Range   WBC 11.2 (H) 4.0 - 10.5 K/uL   RBC 3.45 (L) 3.87 - 5.11 MIL/uL   Hemoglobin 11.1 (L) 12.0 - 15.0 g/dL   HCT 45.431.5 (L) 09.836.0 - 11.946.0 %   MCV 91.3 80.0 - 100.0 fL   MCH 32.2 26.0 - 34.0 pg   MCHC 35.2 30.0 - 36.0 g/dL   RDW 14.711.7 82.911.5 - 56.215.5 %   Platelets 184 150 - 400 K/uL    nRBC 0.0 0.0 - 0.2 %  Urinalysis, Routine w reflex microscopic     Status: None   Collection Time: 09/16/20  8:48 AM  Result Value Ref Range   Color, Urine YELLOW YELLOW   APPearance CLEAR CLEAR   Specific Gravity, Urine 1.014 1.005 - 1.030   pH 7.0 5.0 - 8.0   Glucose, UA NEGATIVE NEGATIVE mg/dL   Hgb urine dipstick NEGATIVE NEGATIVE   Bilirubin Urine NEGATIVE NEGATIVE   Ketones, ur NEGATIVE NEGATIVE mg/dL   Protein, ur NEGATIVE NEGATIVE mg/dL   Nitrite NEGATIVE NEGATIVE   Leukocytes,Ua NEGATIVE NEGATIVE  I-Stat beta hCG blood, ED     Status: Abnormal   Collection Time: 09/16/20  8:54 AM  Result Value Ref Range   I-stat hCG, quantitative >2,000.0 (H) <5 mIU/mL   Comment 3           MR PELVIS WO CONTRAST  Result Date: 09/16/2020 CLINICAL DATA:  Twenty-one weeks pregnant, right upper quadrant pain, abnormal fluid in the right upper quadrant on ultrasound EXAM: MRI ABDOMEN AND PELVIS WITHOUT CONTRAST TECHNIQUE: Multiplanar multisequence MR imaging of the abdomen and pelvis was performed. No intravenous contrast was administered. COMPARISON:  Same-day right upper quadrant ultrasound FINDINGS: COMBINED FINDINGS FOR BOTH MR ABDOMEN AND PELVIS Lower chest: No acute findings. Hepatobiliary: No mass or other parenchymal abnormality identified. Pancreas: No mass, inflammatory changes, or other parenchymal abnormality identified. Spleen:  Within normal limits in size and appearance. Adrenals/Urinary Tract: No masses identified.  No evidence of hydronephrosis. Stomach/Bowel: Visualized portions within the abdomen are unremarkable. Normal appendix (series 10, image 30). Vascular/Lymphatic: No pathologically enlarged lymph nodes identified. No abdominal aortic aneurysm demonstrated. Reproductive: Gravid uterus with unremarkable appearance of fetus, on this examination not tailored for the evaluation of fetal anatomy. Anterior placenta. There is an unusual, well-circumscribed cystic lesion in the right upper  quadrant interposed between the uterine fundus, liver, and gallbladder. Measuring approximately 10.1 x 7.3 cm, which appears to arise from the right ovary, demonstrating claw sign on select sequences (e.g. series 12, image 11). Other:  None. Musculoskeletal: No suspicious bone lesions identified. IMPRESSION: 1. There is an unusual, well-circumscribed cystic lesion in the right upper quadrant measuring approximately 10.1 cm, which appears to arise from the right ovary, demonstrating claw sign on select sequences. This is favored to represent a large right ovarian cyst. Please note that any enlarged ovary can serve as a nidus for torsion. 2. Gravid uterus with unremarkable appearance of fetus, on this examination not tailored for the evaluation of fetal anatomy. Anterior placenta. Electronically Signed   By: Lauralyn Primes M.D.   On: 09/16/2020 17:35   MR ABDOMEN WO CONTRAST  Result Date: 09/16/2020 CLINICAL DATA:  Twenty-one weeks pregnant, right upper quadrant pain, abnormal fluid in the right upper quadrant on ultrasound EXAM: MRI ABDOMEN AND PELVIS WITHOUT CONTRAST TECHNIQUE: Multiplanar multisequence MR imaging of the abdomen and pelvis was performed. No intravenous contrast was administered. COMPARISON:  Same-day right upper quadrant ultrasound FINDINGS: COMBINED FINDINGS FOR BOTH MR ABDOMEN AND PELVIS Lower chest: No acute findings. Hepatobiliary: No mass or other parenchymal abnormality identified. Pancreas: No mass, inflammatory changes, or other parenchymal abnormality identified. Spleen:  Within normal limits in size and appearance. Adrenals/Urinary Tract: No masses identified. No evidence of hydronephrosis. Stomach/Bowel: Visualized portions within the abdomen are unremarkable. Normal appendix (series 10, image 30). Vascular/Lymphatic: No pathologically enlarged lymph nodes identified. No abdominal aortic aneurysm demonstrated. Reproductive: Gravid uterus with unremarkable appearance of fetus, on this  examination not tailored for the evaluation of fetal anatomy. Anterior placenta. There is an unusual, well-circumscribed cystic lesion in the right upper quadrant interposed between the uterine fundus, liver, and gallbladder. Measuring approximately 10.1 x 7.3 cm, which appears to arise from the right ovary, demonstrating claw sign on select sequences (e.g. series 12, image 11). Other:  None. Musculoskeletal: No suspicious bone lesions identified. IMPRESSION: 1. There is an unusual, well-circumscribed cystic lesion in the right upper quadrant measuring approximately 10.1 cm, which appears to arise from the right ovary, demonstrating claw sign on select sequences. This is favored to represent a large right ovarian cyst. Please note that any enlarged ovary can serve as a nidus for torsion. 2. Gravid uterus with unremarkable appearance of fetus, on this examination not tailored for the evaluation of fetal anatomy. Anterior placenta. Electronically Signed   By: Lauralyn Primes M.D.   On: 09/16/2020 17:35   US PELVIS LIMITED (TRANSABDOMINAL ONLY)  Result Date: 09/16/2020 CLINICAL DATA:  Right adnexal cyst seen on previous ultrasound and MRI, pregnant EXAM: LIMITED ULTRASOUND OF PELVIS TECHNIQUE: Limited transabdominal ultrasound examination of the pelvis was performed. COMPARISON:  09/16/2020, 09/08/2020 FINDINGS: Limited pelvic ultrasound was performed. Unilocular right adnexal cyst measuring 14.1 x 6.9 x 8.8 cm is again identified, unchanged since MRI performed earlier today. A normal left ovary measures 2.1 x 3.0 x 2.9 cm. Doppler interrogation of the bilateral ovaries demonstrates normal arterial and venous waveforms. Evaluation of the gravid uterus was not performed on this  examination tailored to evaluate the right adnexal cyst. IMPRESSION: 1. Large unilocular right adnexal cyst, unchanged since MRI performed earlier today. 2. No evidence of ovarian torsion. Electronically Signed   By: Sharlet Salina M.D.   On:  09/16/2020 18:48   US ABDOMEN LIMITED RUQ (LIVER/GB)  Result Date: 09/16/2020 CLINICAL DATA:  Right upper quadrant pain. Twenty-one weeks pregnant. EXAM: ULTRASOUND ABDOMEN LIMITED RIGHT UPPER QUADRANT COMPARISON:  Obstetric ultrasound 09/08/2020. Prior abdominal ultrasound 07/05/2020. Most recent CT 10/28/2018. FINDINGS: Gallbladder: No gallstones or wall thickening visualized. No sonographic Murphy sign noted by sonographer. Common bile duct: Diameter: Normal, 1 mm Liver: No focal lesion identified. Within normal limits in parenchymal echogenicity. Portal vein is patent on color Doppler imaging with normal direction of blood flow towards the liver. Other: Either a simple cystic mass or an area of loculated ascites is identified within the right abdomen, just caudal to the gallbladder. Measures on the order of 14.6 x 6.8 x 10.5 cm. IMPRESSION: Loculated ascites versus is a cystic mass within the right-side of the abdomen, just caudal to the gallbladder. This is indeterminate, incompletely characterized, but new compared to 07/05/2020. This may be contiguous with the abnormality described within the right adnexa on 09/08/2020 obstetric ultrasound. Consider further evaluation with noncontrast abdominopelvic MRI. Electronically Signed   By: Jeronimo Greaves M.D.   On: 09/16/2020 13:17    MDM Patient sent from ED for further evaluation of RUQ pain. Labs collected & IV fluids given in ED. RUQ ultrasound ordered in ED & performed in MAU.   Ultrasound shows fluid in RUQ= ascites vs cystic mass with recommendation for MRI. MRI performed shows 14 cm right adnexal mass consistent with previous images. Ovarian torsion ruled out by ultrasound this evening.   Patient given soap suds enema in MAU with good results. Discussed management of constipation at home including discontinuing zofran, increasing colace to BID, and taking miralax or dulcolax. Also discussed clean out with miralax in gatorade. Discussed water intake,  fiber intake, and dietary changes.   Pt is currently [facility] patient. Receiving weekly makena injections for hx of PTD. Per review of records, patient was induced at 35 wks fo HELLP so Cruzita Lederer is not indicated - message sent to office to discontinue.  Patient meets exclusion criteria for being transferred out of [facility] due to "history of severe pre-eclampsia requiring preterm birth at less than 37 weeks". Message sent to [facility] for patient to be moved to [facility] office.     Assessment and Plan   1. Ovarian cyst affecting pregnancy in second trimester, antepartum   2. Epigastric pain   3. Constipation, unspecified constipation type   4. Hemorrhoids, unspecified hemorrhoid type   5. [redacted] weeks gestation of pregnancy   6. RUQ pain   7. Supervision of high risk pregnancy, antepartum   8. Ovarian cyst affecting pregnancy in third trimester, antepartum    -message to [facility] to cancel Makena & transfer to [facility] -increase colace to BID -Rx dibucaine for hemorrhoids -d/c zofran. Rx phenergan prn nausea -given information for miralax clean out & constipation management   Judeth Horn 09/16/2020, 8:06 PM

## 2020-09-18 LAB — URINE CULTURE

## 2020-09-21 ENCOUNTER — Telehealth: Payer: Self-pay

## 2020-09-22 ENCOUNTER — Ambulatory Visit: Payer: Medicaid Other

## 2020-09-23 NOTE — Telephone Encounter (Signed)
Spoke with [facility] - patient is scheduled for a Fetal Echo on: 10/12/20 @ 8:30am

## 2020-09-27 ENCOUNTER — Telehealth: Payer: Self-pay

## 2020-09-27 DIAGNOSIS — L732 Hidradenitis suppurativa: Secondary | ICD-10-CM | POA: Diagnosis not present

## 2020-09-27 NOTE — Telephone Encounter (Signed)
Spoke w/[facility]  Patient scheduled for a fetal echo: 10/12/20 @ 8:30am

## 2020-09-29 ENCOUNTER — Ambulatory Visit: Payer: Medicaid Other

## 2020-10-04 ENCOUNTER — Encounter: Payer: Self-pay | Admitting: *Deleted

## 2020-10-04 ENCOUNTER — Other Ambulatory Visit: Payer: Self-pay

## 2020-10-04 ENCOUNTER — Ambulatory Visit: Payer: Medicaid Other | Attending: Maternal & Fetal Medicine | Admitting: *Deleted

## 2020-10-04 ENCOUNTER — Other Ambulatory Visit: Payer: Self-pay | Admitting: *Deleted

## 2020-10-04 ENCOUNTER — Ambulatory Visit (HOSPITAL_BASED_OUTPATIENT_CLINIC_OR_DEPARTMENT_OTHER): Payer: Medicaid Other

## 2020-10-04 DIAGNOSIS — O99322 Drug use complicating pregnancy, second trimester: Secondary | ICD-10-CM | POA: Diagnosis not present

## 2020-10-04 DIAGNOSIS — Q210 Ventricular septal defect: Secondary | ICD-10-CM

## 2020-10-04 DIAGNOSIS — Z3A24 24 weeks gestation of pregnancy: Secondary | ICD-10-CM

## 2020-10-04 DIAGNOSIS — Z862 Personal history of diseases of the blood and blood-forming organs and certain disorders involving the immune mechanism: Secondary | ICD-10-CM

## 2020-10-04 DIAGNOSIS — F1910 Other psychoactive substance abuse, uncomplicated: Secondary | ICD-10-CM

## 2020-10-04 DIAGNOSIS — E669 Obesity, unspecified: Secondary | ICD-10-CM | POA: Diagnosis not present

## 2020-10-04 DIAGNOSIS — F1290 Cannabis use, unspecified, uncomplicated: Secondary | ICD-10-CM | POA: Diagnosis not present

## 2020-10-04 DIAGNOSIS — O09892 Supervision of other high risk pregnancies, second trimester: Secondary | ICD-10-CM

## 2020-10-04 DIAGNOSIS — Z6834 Body mass index (BMI) 34.0-34.9, adult: Secondary | ICD-10-CM

## 2020-10-04 DIAGNOSIS — O99212 Obesity complicating pregnancy, second trimester: Secondary | ICD-10-CM | POA: Diagnosis not present

## 2020-10-04 DIAGNOSIS — D573 Sickle-cell trait: Secondary | ICD-10-CM | POA: Insufficient documentation

## 2020-10-04 DIAGNOSIS — O09212 Supervision of pregnancy with history of pre-term labor, second trimester: Secondary | ICD-10-CM | POA: Diagnosis not present

## 2020-10-04 DIAGNOSIS — Z362 Encounter for other antenatal screening follow-up: Secondary | ICD-10-CM | POA: Insufficient documentation

## 2020-10-04 DIAGNOSIS — O09292 Supervision of pregnancy with other poor reproductive or obstetric history, second trimester: Secondary | ICD-10-CM

## 2020-10-04 DIAGNOSIS — O0990 Supervision of high risk pregnancy, unspecified, unspecified trimester: Secondary | ICD-10-CM

## 2020-10-06 ENCOUNTER — Ambulatory Visit: Payer: Medicaid Other

## 2020-10-12 DIAGNOSIS — Z363 Encounter for antenatal screening for malformations: Secondary | ICD-10-CM | POA: Diagnosis not present

## 2020-10-12 DIAGNOSIS — O358XX0 Maternal care for other (suspected) fetal abnormality and damage, not applicable or unspecified: Secondary | ICD-10-CM | POA: Diagnosis not present

## 2020-10-12 DIAGNOSIS — Z3A25 25 weeks gestation of pregnancy: Secondary | ICD-10-CM | POA: Diagnosis not present

## 2020-10-13 ENCOUNTER — Ambulatory Visit: Payer: Medicaid Other

## 2020-10-20 ENCOUNTER — Encounter: Payer: Medicaid Other | Admitting: Obstetrics and Gynecology

## 2020-10-20 ENCOUNTER — Ambulatory Visit (INDEPENDENT_AMBULATORY_CARE_PROVIDER_SITE_OTHER): Payer: Medicaid Other | Admitting: Obstetrics and Gynecology

## 2020-10-20 ENCOUNTER — Other Ambulatory Visit: Payer: Self-pay

## 2020-10-20 ENCOUNTER — Encounter: Payer: Self-pay | Admitting: Obstetrics and Gynecology

## 2020-10-20 ENCOUNTER — Encounter: Payer: Self-pay | Admitting: Family Medicine

## 2020-10-20 VITALS — BP 111/58 | HR 76 | Wt 261.7 lb

## 2020-10-20 DIAGNOSIS — O09292 Supervision of pregnancy with other poor reproductive or obstetric history, second trimester: Secondary | ICD-10-CM

## 2020-10-20 DIAGNOSIS — O35BXX0 Maternal care for other (suspected) fetal abnormality and damage, fetal cardiac anomalies, not applicable or unspecified: Secondary | ICD-10-CM | POA: Insufficient documentation

## 2020-10-20 DIAGNOSIS — O09892 Supervision of other high risk pregnancies, second trimester: Secondary | ICD-10-CM

## 2020-10-20 DIAGNOSIS — Z23 Encounter for immunization: Secondary | ICD-10-CM

## 2020-10-20 DIAGNOSIS — O358XX0 Maternal care for other (suspected) fetal abnormality and damage, not applicable or unspecified: Secondary | ICD-10-CM

## 2020-10-20 DIAGNOSIS — O0990 Supervision of high risk pregnancy, unspecified, unspecified trimester: Secondary | ICD-10-CM

## 2020-10-20 DIAGNOSIS — D573 Sickle-cell trait: Secondary | ICD-10-CM | POA: Diagnosis not present

## 2020-10-20 DIAGNOSIS — N83291 Other ovarian cyst, right side: Secondary | ICD-10-CM

## 2020-10-20 HISTORY — DX: Maternal care for other (suspected) fetal abnormality and damage, fetal cardiac anomalies, not applicable or unspecified: O35.BXX0

## 2020-10-20 NOTE — Patient Instructions (Signed)

## 2020-10-20 NOTE — Progress Notes (Signed)
Patient completed Pregnancy Risk Screening Form

## 2020-11-01 ENCOUNTER — Inpatient Hospital Stay (HOSPITAL_COMMUNITY)
Admission: AD | Admit: 2020-11-01 | Discharge: 2020-11-01 | Disposition: A | Discharge status: Home / Self Care | Payer: Medicaid Other | Attending: Obstetrics and Gynecology | Admitting: Obstetrics and Gynecology

## 2020-11-01 ENCOUNTER — Inpatient Hospital Stay (HOSPITAL_COMMUNITY): Payer: Medicaid Other

## 2020-11-01 ENCOUNTER — Inpatient Hospital Stay (HOSPITAL_BASED_OUTPATIENT_CLINIC_OR_DEPARTMENT_OTHER): Payer: Medicaid Other

## 2020-11-01 ENCOUNTER — Other Ambulatory Visit: Payer: Self-pay

## 2020-11-01 ENCOUNTER — Encounter (HOSPITAL_COMMUNITY): Payer: Self-pay | Admitting: Obstetrics and Gynecology

## 2020-11-01 DIAGNOSIS — R1031 Right lower quadrant pain: Secondary | ICD-10-CM | POA: Diagnosis not present

## 2020-11-01 DIAGNOSIS — R197 Diarrhea, unspecified: Secondary | ICD-10-CM | POA: Diagnosis not present

## 2020-11-01 DIAGNOSIS — O212 Late vomiting of pregnancy: Secondary | ICD-10-CM | POA: Diagnosis not present

## 2020-11-01 DIAGNOSIS — O99333 Smoking (tobacco) complicating pregnancy, third trimester: Secondary | ICD-10-CM | POA: Insufficient documentation

## 2020-11-01 DIAGNOSIS — O26893 Other specified pregnancy related conditions, third trimester: Secondary | ICD-10-CM | POA: Diagnosis not present

## 2020-11-01 DIAGNOSIS — F17210 Nicotine dependence, cigarettes, uncomplicated: Secondary | ICD-10-CM | POA: Diagnosis not present

## 2020-11-01 DIAGNOSIS — Z79899 Other long term (current) drug therapy: Secondary | ICD-10-CM | POA: Insufficient documentation

## 2020-11-01 DIAGNOSIS — Z3A28 28 weeks gestation of pregnancy: Secondary | ICD-10-CM | POA: Insufficient documentation

## 2020-11-01 DIAGNOSIS — R102 Pelvic and perineal pain: Secondary | ICD-10-CM | POA: Insufficient documentation

## 2020-11-01 DIAGNOSIS — N83291 Other ovarian cyst, right side: Secondary | ICD-10-CM | POA: Insufficient documentation

## 2020-11-01 DIAGNOSIS — O3483 Maternal care for other abnormalities of pelvic organs, third trimester: Secondary | ICD-10-CM | POA: Diagnosis not present

## 2020-11-01 DIAGNOSIS — R109 Unspecified abdominal pain: Secondary | ICD-10-CM

## 2020-11-01 DIAGNOSIS — O99213 Obesity complicating pregnancy, third trimester: Secondary | ICD-10-CM | POA: Insufficient documentation

## 2020-11-01 DIAGNOSIS — O0990 Supervision of high risk pregnancy, unspecified, unspecified trimester: Secondary | ICD-10-CM

## 2020-11-01 DIAGNOSIS — E669 Obesity, unspecified: Secondary | ICD-10-CM | POA: Diagnosis not present

## 2020-11-01 DIAGNOSIS — O09292 Supervision of pregnancy with other poor reproductive or obstetric history, second trimester: Secondary | ICD-10-CM

## 2020-11-01 DIAGNOSIS — O09892 Supervision of other high risk pregnancies, second trimester: Secondary | ICD-10-CM

## 2020-11-01 LAB — URINALYSIS, ROUTINE W REFLEX MICROSCOPIC
Bilirubin Urine: NEGATIVE
Glucose, UA: NEGATIVE mg/dL
Hgb urine dipstick: NEGATIVE
Ketones, ur: NEGATIVE mg/dL
Leukocytes,Ua: NEGATIVE
Nitrite: NEGATIVE
Protein, ur: NEGATIVE mg/dL
Specific Gravity, Urine: 1.015 (ref 1.005–1.030)
pH: 7 (ref 5.0–8.0)

## 2020-11-01 MED ORDER — CAPSAICIN 0.033 % EX CREA: 1.0000 | TOPICAL_CREAM | Freq: Three times a day (TID) | CUTANEOUS | 0 refills | Status: DC | PRN

## 2020-11-01 NOTE — MAU Provider Note (Signed)
History     CSN: [id number]  Arrival date and time: 11/01/20 1637   None     Chief Complaint  Patient presents with  . Nausea  . Emesis  . Diarrhea   HPI Leslie Yates is a 30yo [phone] @ 28.3wks who presents with multiple concerns> intermittent R low abd pain, N/V- takes Phenergan approx QOD (has been an ongoing issue prior to preg), difficulty sleeping. Denies vag leaking, bleeding, or ctx; no dysuria. Has a known R ovarian cyst 14 x 7 x 7cm, documented by MRI on 09/16/20.  OB History    Gravida  2   Para  1   Term  0   Preterm  1   AB  0   Living  1     SAB  0   IAB  0   Ectopic  0   Multiple  0   Live Births  1           Past Medical History:  Diagnosis Date  . Asthma   . Cyclical vomiting with nausea 10/27/2018   ED visits for vomiting and diarrhea since 2009-13 At least 4-5 references witnessing patient inducing vomiting by sticking fingers down throat Has had difficulty obtaining meds through the years GI consult made but never went  . Elevated liver enzymes 11/19/2019  . Gestational hypertension 11/17/2019   IOL @ 37 weeks  . Lump of breast 06/12/2019  . Obesity   . Ovarian cyst   . Sickle cell trait [facility])     Past Surgical History:  Procedure Laterality Date  . INCISION AND DRAINAGE ABSCESS      Family History  Problem Relation Age of Onset  . Diabetes Mother   . Hypertension Mother   . Diabetes Father   . Hypertension Father     Social History   Tobacco Use  . Smoking status: Current Every Day Smoker    Packs/day: 1.00    Years: 11.00    Pack years: 11.00    Types: Cigarettes  . Smokeless tobacco: Never Used  Vaping Use  . Vaping Use: Never used  Substance Use Topics  . Alcohol use: Not Currently    Comment: occ  . Drug use: Yes    Types: Marijuana    Comment: states marijuana use to aid appetite     Allergies:  Allergies  Allergen Reactions  . Penicillins Rash    Childhood rash Has patient had a PCN reaction causing  immediate rash, facial/tongue/throat swelling, SOB or lightheadedness with hypotension: no Has patient had a PCN reaction causing severe rash involving mucus membranes or skin necrosis:no Has patient had a PCN reaction that required hospitalization no Has patient had a PCN reaction occurring within the last 10 years: no If all of the above answers are "NO", then may proceed with Cephalosporin use.     Medications Prior to Admission  Medication Sig Dispense Refill Last Dose  . albuterol (VENTOLIN HFA) 108 (90 Base) MCG/ACT inhaler Inhale 2 puffs into the lungs every 6 (six) hours as needed for wheezing or shortness of breath. 18 g 1 Past Month at Unknown time  . Prenatal 27-1 MG TABS Take 1 tablet by mouth daily. 30 tablet 12 10/31/2020 at Unknown time  . promethazine (PHENERGAN) 12.5 MG tablet Take 1 tablet (12.5 mg total) by mouth every 6 (six) hours as needed for nausea or vomiting. 30 tablet 0 10/31/2020 at Unknown time  . promethazine (PHENERGAN) 25 MG tablet Take 1 tablet (25  mg total) by mouth every 6 (six) hours as needed for nausea or vomiting. 30 tablet 0 10/31/2020 at Unknown time  . aspirin EC 81 MG tablet Take 1 tablet (81 mg total) by mouth daily. 60 tablet 2   . Blood Pressure Monitoring (BLOOD PRESSURE MONITOR AUTOMAT) DEVI 1 Device by Does not apply route daily. Automatic blood pressure cuff regular/x-large size cuff. To monitor blood pressure regularly at home. ICD-10 code: O09.90. 1 each 0 Unknown at Unknown time  . dibucaine (NUPERCAINAL) 1 % OINT Place 1 application rectally as needed for hemorrhoids. 28 g 1 Unknown at Unknown time  . docusate sodium (COLACE) 100 MG capsule Take 1 capsule (100 mg total) by mouth 2 (two) times daily. 60 capsule 2   . Doxylamine-Pyridoxine 10-10 MG TBEC Take 2 tablets by mouth at bedtime. 60 tablet 2 Unknown at Unknown time  . fluticasone (FLOVENT HFA) 110 MCG/ACT inhaler Inhale 2 puffs into the lungs in the morning and at bedtime. 12 g 12 Unknown at  Unknown time  . metoCLOPramide (REGLAN) 10 MG tablet Take 1 tablet (10 mg total) by mouth every 6 (six) hours. 30 tablet 0 Unknown at Unknown time  . polyethylene glycol (MIRALAX) 17 g packet Take 17 g by mouth daily. Until bowel movement successful. 14 each 0 Unknown at Unknown time    Review of Systems No other pertinents other than what is listed in HPI Physical Exam   Blood pressure (!) 106/59, pulse 76, temperature 98.2 F (36.8 C), temperature source Oral, weight 119.3 kg, last menstrual period 04/08/2020, SpO2 97 %, not currently breastfeeding.  Physical Exam HENT:     Head: Normocephalic.     Mouth/Throat:     Mouth: Mucous membranes are moist.  Cardiovascular:     Rate and Rhythm: Normal rate.  Pulmonary:     Effort: Pulmonary effort is normal.  Abdominal:     Comments: EFM 150s, +accels, no decels Ctx: none  Musculoskeletal:        General: Normal range of motion.     Cervical back: Normal range of motion.  Skin:    General: Skin is warm and dry.  Neurological:     General: No focal deficit present.     Mental Status: She is alert.  Psychiatric:        Mood and Affect: Mood normal.        Thought Content: Thought content normal.    Urinalysis    Component Value Date/Time   COLORURINE YELLOW 11/01/2020 1716   APPEARANCEUR HAZY (A) 11/01/2020 1716   LABSPEC 1.015 11/01/2020 1716   PHURINE 7.0 11/01/2020 1716   GLUCOSEU NEGATIVE 11/01/2020 1716   HGBUR NEGATIVE 11/01/2020 1716   BILIRUBINUR NEGATIVE 11/01/2020 1716   BILIRUBINUR negative 08/19/2018 1544   BILIRUBINUR small 11/15/2016 0943   KETONESUR NEGATIVE 11/01/2020 1716   PROTEINUR NEGATIVE 11/01/2020 1716   UROBILINOGEN 1.0 08/19/2018 1544   UROBILINOGEN 0.2 02/28/2016 1924   NITRITE NEGATIVE 11/01/2020 1716   LEUKOCYTESUR NEGATIVE 11/01/2020 1716   U/S: R cyst 13 x 7 x 9 cm> mostly stable, EFW 68th%  MAU Course  Procedures  MDM UA, OB U/S limited  Assessment and Plan  IUP@28 .3wks RLQ  discomfort from ovarian cyst N/V  D/C home with comfort measures for R abd discomfort (warm packs, Tylenol) Requesting capsaicin cream for abd (helped her w cannabis hyperemesis in the past) Keep OB appt w [facility]TT on 11/04/20  [name] 11/01/2020, 6:11 PM

## 2020-11-01 NOTE — Discharge Instructions (Signed)
Abdominal Pain During Pregnancy Belly (abdominal) pain is common during pregnancy. There are many possible causes. Some causes are more serious than others. Sometimes the cause is not known. Always tell your doctor if you have belly pain. Follow these instructions at home:  Do not have sex or put anything in your vagina until your pain goes away completely.  Get plenty of rest until your pain gets better.  Drink enough fluid to keep your pee (urine) pale yellow.  Take over-the-counter and prescription medicines only as told by your doctor.  Keep all follow-up visits.   Contact a doctor if:  You keep having pain after resting.  Your pain gets worse after resting.  You have lower belly pain that: ? Comes and goes at regular times. ? Spreads to your back. ? Feels like menstrual cramps.  You have pain or burning when you pee (urinate). Get help right away if:  You have a fever or chills.  You feel like it is hard to breathe.  You have bleeding from your vagina.  You are leaking fluid or tissue from your vagina.  You vomit for more than 24 hours.  You have watery poop (diarrhea) for more than 24 hours.  Your baby is moving less than usual.  You feel very weak or faint.  You have very bad pain in your upper belly. Summary  Belly pain is common during pregnancy. There are many possible causes.  If you have belly pain during pregnancy, tell your doctor right away.  Keep all follow-up visits. This information is not intended to replace advice given to you by your health care provider. Make sure you discuss any questions you have with your health care provider. Document Revised: 02/23/2020 Document Reviewed: 02/23/2020 Elsevier Patient Education  2021 Elsevier Inc.  

## 2020-11-01 NOTE — MAU Note (Signed)
Patient states she has been experiencing n/v and diarrhea with loss of appetite. Especially in the mornings or after work, patient states she has low energy to perform general tasks. Patient states vomiting occurs every other day.

## 2020-11-02 DIAGNOSIS — O99323 Drug use complicating pregnancy, third trimester: Secondary | ICD-10-CM

## 2020-11-02 DIAGNOSIS — E669 Obesity, unspecified: Secondary | ICD-10-CM

## 2020-11-02 DIAGNOSIS — O09293 Supervision of pregnancy with other poor reproductive or obstetric history, third trimester: Secondary | ICD-10-CM

## 2020-11-02 DIAGNOSIS — O99213 Obesity complicating pregnancy, third trimester: Secondary | ICD-10-CM | POA: Diagnosis not present

## 2020-11-02 DIAGNOSIS — Z3A28 28 weeks gestation of pregnancy: Secondary | ICD-10-CM

## 2020-11-02 DIAGNOSIS — F1910 Other psychoactive substance abuse, uncomplicated: Secondary | ICD-10-CM

## 2020-11-02 DIAGNOSIS — Z362 Encounter for other antenatal screening follow-up: Secondary | ICD-10-CM

## 2020-11-03 ENCOUNTER — Other Ambulatory Visit: Payer: Self-pay | Admitting: *Deleted

## 2020-11-03 DIAGNOSIS — O0990 Supervision of high risk pregnancy, unspecified, unspecified trimester: Secondary | ICD-10-CM

## 2020-11-04 ENCOUNTER — Ambulatory Visit (INDEPENDENT_AMBULATORY_CARE_PROVIDER_SITE_OTHER): Payer: Medicaid Other | Admitting: Obstetrics & Gynecology

## 2020-11-04 ENCOUNTER — Other Ambulatory Visit: Payer: Self-pay

## 2020-11-04 ENCOUNTER — Other Ambulatory Visit: Payer: Medicaid Other

## 2020-11-04 VITALS — BP 106/53 | HR 68 | Wt 257.8 lb

## 2020-11-04 DIAGNOSIS — O0990 Supervision of high risk pregnancy, unspecified, unspecified trimester: Secondary | ICD-10-CM | POA: Diagnosis not present

## 2020-11-04 DIAGNOSIS — Z23 Encounter for immunization: Secondary | ICD-10-CM

## 2020-11-04 DIAGNOSIS — O358XX0 Maternal care for other (suspected) fetal abnormality and damage, not applicable or unspecified: Secondary | ICD-10-CM

## 2020-11-04 DIAGNOSIS — O09892 Supervision of other high risk pregnancies, second trimester: Secondary | ICD-10-CM

## 2020-11-04 DIAGNOSIS — N83291 Other ovarian cyst, right side: Secondary | ICD-10-CM

## 2020-11-04 DIAGNOSIS — O35BXX0 Maternal care for other (suspected) fetal abnormality and damage, fetal cardiac anomalies, not applicable or unspecified: Secondary | ICD-10-CM

## 2020-11-04 NOTE — Patient Instructions (Signed)

## 2020-11-04 NOTE — Progress Notes (Signed)
   PRENATAL VISIT NOTE  Subjective:  Leslie Yates is a 30 y.o. G2P0101 at [redacted]w[redacted]d being seen today for ongoing prenatal care.  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  Patient reports right side pain.  Contractions: Not present. Vag. Bleeding: None.  Movement: Present. Denies leaking of fluid.   The following portions of the patient's history were reviewed and updated as appropriate: allergies, current medications, past family history, past medical history, past social history, past surgical history and problem list.   Objective:   Vitals:   11/04/20 1039  BP: (!) 106/53  Pulse: 68  Weight: 257 lb 12.8 oz (116.9 kg)    Fetal Status: Fetal Heart Rate (bpm): 150   Movement: Present     General:  Alert, oriented and cooperative. Patient is in no acute distress.  Skin: Skin is warm and dry. No rash noted.   Cardiovascular: Normal heart rate noted  Respiratory: Normal respiratory effort, no problems with respiration noted  Abdomen: Soft, gravid, appropriate for gestational age.  Pain/Pressure: Absent     Pelvic: Cervical exam deferred        Extremities: Normal range of motion.  Edema: None  Mental Status: Normal mood and affect. Normal behavior. Normal judgment and thought content.   Assessment and Plan:  Pregnancy: G2P0101 at [redacted]w[redacted]d 1. Supervision of high risk pregnancy, antepartum  - Tdap vaccine greater than or equal to 7yo IM  2. 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 Hx of preterm  delivery, currently pregnant, second trimester HELLP   4. Complex cyst of right ovary F/u US reviewed, exam is benign today  Preterm labor symptoms and general obstetric precautions including but not limited to vaginal bleeding, contractions, leaking of fluid and fetal movement were reviewed in detail with the patient. Please refer to After Visit Summary for other counseling recommendations.   Return in about 19 days (around 11/23/2020).  Future Appointments  Date Time Provider Department Center  11/23/2020  1:45 PM [facility] NURSE [facility]  11/23/2020  2:00 PM [facility]    Scheryl Darter, MD

## 2020-11-05 LAB — GLUCOSE TOLERANCE, 2 HOURS W/ 1HR
Glucose, 1 hour: 115 mg/dL (ref 65–179)
Glucose, 2 hour: 85 mg/dL (ref 65–152)
Glucose, Fasting: 79 mg/dL (ref 65–91)

## 2020-11-08 LAB — CBC
Hematocrit: 30 % — ABNORMAL LOW (ref 34.0–46.6)
Hemoglobin: 10.7 g/dL — ABNORMAL LOW (ref 11.1–15.9)
MCH: 31.8 pg (ref 26.6–33.0)
MCHC: 35.7 g/dL (ref 31.5–35.7)
MCV: 89 fL (ref 79–97)
Platelets: 189 10*3/uL (ref 150–450)
RBC: 3.36 x10E6/uL — ABNORMAL LOW (ref 3.77–5.28)
RDW: 11.5 % — ABNORMAL LOW (ref 11.7–15.4)
WBC: 9 10*3/uL (ref 3.4–10.8)

## 2020-11-08 LAB — RPR: RPR Ser Ql: NONREACTIVE

## 2020-11-08 LAB — HIV ANTIBODY (ROUTINE TESTING W REFLEX): HIV Screen 4th Generation wRfx: NONREACTIVE

## 2020-11-20 ENCOUNTER — Other Ambulatory Visit: Payer: Self-pay | Admitting: Advanced Practice Midwife

## 2020-11-22 ENCOUNTER — Other Ambulatory Visit: Payer: Self-pay

## 2020-11-22 ENCOUNTER — Encounter (HOSPITAL_COMMUNITY): Payer: Self-pay | Admitting: Obstetrics and Gynecology

## 2020-11-22 ENCOUNTER — Inpatient Hospital Stay (HOSPITAL_COMMUNITY): Payer: Medicaid Other

## 2020-11-22 ENCOUNTER — Inpatient Hospital Stay (HOSPITAL_COMMUNITY)
Admission: AD | Admit: 2020-11-22 | Discharge: 2020-11-22 | Disposition: A | Discharge status: Home / Self Care | Payer: Medicaid Other | Attending: Obstetrics and Gynecology | Admitting: Obstetrics and Gynecology

## 2020-11-22 DIAGNOSIS — O99333 Smoking (tobacco) complicating pregnancy, third trimester: Secondary | ICD-10-CM | POA: Diagnosis not present

## 2020-11-22 DIAGNOSIS — Z79899 Other long term (current) drug therapy: Secondary | ICD-10-CM | POA: Insufficient documentation

## 2020-11-22 DIAGNOSIS — Z7982 Long term (current) use of aspirin: Secondary | ICD-10-CM | POA: Insufficient documentation

## 2020-11-22 DIAGNOSIS — O99213 Obesity complicating pregnancy, third trimester: Secondary | ICD-10-CM | POA: Diagnosis not present

## 2020-11-22 DIAGNOSIS — O99891 Other specified diseases and conditions complicating pregnancy: Secondary | ICD-10-CM

## 2020-11-22 DIAGNOSIS — M7989 Other specified soft tissue disorders: Secondary | ICD-10-CM | POA: Diagnosis not present

## 2020-11-22 DIAGNOSIS — M79609 Pain in unspecified limb: Secondary | ICD-10-CM

## 2020-11-22 DIAGNOSIS — F17210 Nicotine dependence, cigarettes, uncomplicated: Secondary | ICD-10-CM | POA: Insufficient documentation

## 2020-11-22 DIAGNOSIS — Z880 Allergy status to penicillin: Secondary | ICD-10-CM | POA: Diagnosis not present

## 2020-11-22 DIAGNOSIS — Z3A31 31 weeks gestation of pregnancy: Secondary | ICD-10-CM

## 2020-11-22 DIAGNOSIS — J45909 Unspecified asthma, uncomplicated: Secondary | ICD-10-CM | POA: Diagnosis not present

## 2020-11-22 DIAGNOSIS — O26893 Other specified pregnancy related conditions, third trimester: Secondary | ICD-10-CM | POA: Diagnosis not present

## 2020-11-22 DIAGNOSIS — L02224 Furuncle of groin: Secondary | ICD-10-CM

## 2020-11-22 DIAGNOSIS — O99713 Diseases of the skin and subcutaneous tissue complicating pregnancy, third trimester: Secondary | ICD-10-CM

## 2020-11-22 DIAGNOSIS — O09892 Supervision of other high risk pregnancies, second trimester: Secondary | ICD-10-CM

## 2020-11-22 DIAGNOSIS — D573 Sickle-cell trait: Secondary | ICD-10-CM | POA: Diagnosis not present

## 2020-11-22 DIAGNOSIS — W109XXA Fall (on) (from) unspecified stairs and steps, initial encounter: Secondary | ICD-10-CM | POA: Insufficient documentation

## 2020-11-22 DIAGNOSIS — O09292 Supervision of pregnancy with other poor reproductive or obstetric history, second trimester: Secondary | ICD-10-CM

## 2020-11-22 DIAGNOSIS — O9A213 Injury, poisoning and certain other consequences of external causes complicating pregnancy, third trimester: Secondary | ICD-10-CM | POA: Diagnosis not present

## 2020-11-22 DIAGNOSIS — S8991XA Unspecified injury of right lower leg, initial encounter: Secondary | ICD-10-CM | POA: Diagnosis not present

## 2020-11-22 DIAGNOSIS — O99513 Diseases of the respiratory system complicating pregnancy, third trimester: Secondary | ICD-10-CM | POA: Insufficient documentation

## 2020-11-22 DIAGNOSIS — Z7951 Long term (current) use of inhaled steroids: Secondary | ICD-10-CM | POA: Diagnosis not present

## 2020-11-22 DIAGNOSIS — M25469 Effusion, unspecified knee: Secondary | ICD-10-CM

## 2020-11-22 DIAGNOSIS — Z3689 Encounter for other specified antenatal screening: Secondary | ICD-10-CM

## 2020-11-22 DIAGNOSIS — L0232 Furuncle of buttock: Secondary | ICD-10-CM | POA: Diagnosis not present

## 2020-11-22 DIAGNOSIS — O0990 Supervision of high risk pregnancy, unspecified, unspecified trimester: Secondary | ICD-10-CM

## 2020-11-22 MED ORDER — MORPHINE SULFATE (PF) 4 MG/ML IV SOLN
4.0000 mg | Freq: Once | INTRAVENOUS | Status: AC
  Administered 2020-11-22: 4 mg via INTRAMUSCULAR
  Filled 2020-11-22: qty 1

## 2020-11-22 MED ORDER — LIDOCAINE HCL (PF) 1 % IJ SOLN
5.0000 mL | Freq: Once | INTRAMUSCULAR | Status: DC
  Filled 2020-11-22: qty 5

## 2020-11-22 MED ORDER — LIDOCAINE HCL (PF) 1 % IJ SOLN
INTRAMUSCULAR | Status: AC
  Filled 2020-11-22: qty 30

## 2020-11-22 NOTE — Discharge Instructions (Signed)
Skin Abscess  A skin abscess is an infected area on or under your skin that contains a collection of pus and other material. An abscess may also be called a furuncle, carbuncle, or boil. An abscess can occur in or on almost any part of your body. Some abscesses break open (rupture) on their own. Most continue to get worse unless they are treated. The infection can spread deeper into the body and eventually into your blood, which can make you feel ill. Treatment usually involves draining the abscess. What are the causes? An abscess occurs when germs, like bacteria, pass through your skin and cause an infection. This may be caused by:  A scrape or cut on your skin.  A puncture wound through your skin, including a needle injection or insect bite.  Blocked oil or sweat glands.  Blocked and infected hair follicles.  A cyst that forms beneath your skin (sebaceous cyst) and becomes infected. What increases the risk? This condition is more likely to develop in people who:  Have a weak body defense system (immune system).  Have diabetes.  Have dry and irritated skin.  Get frequent injections or use illegal IV drugs.  Have a foreign body in a wound, such as a splinter.  Have problems with their lymph system or veins. What are the signs or symptoms? Symptoms of this condition include:  A painful, firm bump under the skin.  A bump with pus at the top. This may break through the skin and drain. Other symptoms include:  Redness surrounding the abscess site.  Warmth.  Swelling of the lymph nodes (glands) near the abscess.  Tenderness.  A sore on the skin. How is this diagnosed? This condition may be diagnosed based on:  A physical exam.  Your medical history.  A sample of pus. This may be used to find out what is causing the infection.  Blood tests.  Imaging tests, such as an ultrasound, CT scan, or MRI. How is this treated? A small abscess that drains on its own may not  need treatment. Treatment for larger abscesses may include:  Moist heat or heat pack applied to the area several times a day.  A procedure to drain the abscess (incision and drainage).  Antibiotic medicines. For a severe abscess, you may first get antibiotics through an IV and then change to antibiotics by mouth. Follow these instructions at home: Medicines  Take over-the-counter and prescription medicines only as told by your health care provider.  If you were prescribed an antibiotic medicine, take it as told by your health care provider. Do not stop taking the antibiotic even if you start to feel better.   Abscess care  If you have an abscess that has not drained, apply heat to the affected area. Use the heat source that your health care provider recommends, such as a moist heat pack or a heating pad. ? Place a towel between your skin and the heat source. ? Leave the heat on for 20-30 minutes. ? Remove the heat if your skin turns bright red. This is especially important if you are unable to feel pain, heat, or cold. You may have a greater risk of getting burned.  Follow instructions from your health care provider about how to take care of your abscess. Make sure you: ? Cover the abscess with a bandage (dressing). ? Change your dressing or gauze as told by your health care provider. ? Wash your hands with soap and water before you change the   dressing or gauze. If soap and water are not available, use hand sanitizer.  Check your abscess every day for signs of a worsening infection. Check for: ? More redness, swelling, or pain. ? More fluid or blood. ? Warmth. ? More pus or a bad smell.   General instructions  To avoid spreading the infection: ? Do not share personal care items, towels, or hot tubs with others. ? Avoid making skin contact with other people.  Keep all follow-up visits as told by your health care provider. This is important. Contact a health care provider if you  have:  More redness, swelling, or pain around your abscess.  More fluid or blood coming from your abscess.  Warm skin around your abscess.  More pus or a bad smell coming from your abscess.  A fever.  Muscle aches.  Chills or a general ill feeling. Get help right away if you:  Have severe pain.  See red streaks on your skin spreading away from the abscess. Summary  A skin abscess is an infected area on or under your skin that contains a collection of pus and other material.  A small abscess that drains on its own may not need treatment.  Treatment for larger abscesses may include having a procedure to drain the abscess and taking an antibiotic. This information is not intended to replace advice given to you by your health care provider. Make sure you discuss any questions you have with your health care provider. Document Revised: 10/02/2018 Document Reviewed: 07/25/2017 Elsevier Patient Education  2021 Elsevier Inc.  

## 2020-11-22 NOTE — MAU Note (Signed)
Presents with multiple complaints.  States she fell yesterday afternoon down the steps, unsure if struck her abdomen, but hit the left temple of her head.  Also states unable to keep anything down, hasn't eaten since yesterday morning. Reports has cysts located between right groin that extends back into buttocks that needs to be evaluated too.  Denies VB or LOF.  Reports no FM today.

## 2020-11-22 NOTE — MAU Provider Note (Signed)
History     CSN: [id number]  Arrival date and time: 11/22/20 1124   None     Chief Complaint  Patient presents with  . Fall  . Emesis  . Nausea  . Cysts on buttock   Leslie Yates is a 30 y.o. G2P0101 at [redacted]w[redacted]d who receives care at [facility].  She presents today for Fall, Emesis, Nausea, and Cysts on buttock.  Patient states she had a cysts drained ~ 5 weeks ago, but it has refilled and is now sore.  She states it is tender to the touch.  She reports she is currently on clindamycin. She also reports she fell down the stairs yesterday and has pain in her leg. She states the pain has been present since that time and is worsening.  She reports she tried to ice it this morning, but the pain was too much. She rates her pain a 10/10 in the boil area and 7/10 in her knee.  She endorses fetal movement and denies vaginal discharge or bleeding.  She reports some N/V, but takes her medications as prescribed.    OB History    Gravida  2   Para  1   Term  0   Preterm  1   AB  0   Living  1     SAB  0   IAB  0   Ectopic  0   Multiple  0   Live Births  1           Past Medical History:  Diagnosis Date  . Asthma   . Cyclical vomiting with nausea 10/27/2018   ED visits for vomiting and diarrhea since 2009-13 At least 4-5 references witnessing patient inducing vomiting by sticking fingers down throat Has had difficulty obtaining meds through the years GI consult made but never went  . Elevated liver enzymes 11/19/2019  . Gestational hypertension 11/17/2019   IOL @ 37 weeks  . Lump of breast 06/12/2019  . Obesity   . Ovarian cyst   . Sickle cell trait [facility])     Past Surgical History:  Procedure Laterality Date  . INCISION AND DRAINAGE ABSCESS      Family History  Problem Relation Age of Onset  . Diabetes Mother   . Hypertension Mother   . Diabetes Father   . Hypertension Father     Social History   Tobacco Use  . Smoking status: Current Every Day Smoker     Packs/day: 1.00    Years: 11.00    Pack years: 11.00    Types: Cigarettes  . Smokeless tobacco: Never Used  Vaping Use  . Vaping Use: Never used  Substance Use Topics  . Alcohol use: Not Currently    Comment: occ  . Drug use: Yes    Types: Marijuana    Comment: states marijuana use to aid appetite     Allergies:  Allergies  Allergen Reactions  . Penicillins Rash    Childhood rash Has patient had a PCN reaction causing immediate rash, facial/tongue/throat swelling, SOB or lightheadedness with hypotension: no Has patient had a PCN reaction causing severe rash involving mucus membranes or skin necrosis:no Has patient had a PCN reaction that required hospitalization no Has patient had a PCN reaction occurring within the last 10 years: no If all of the above answers are "NO", then may proceed with Cephalosporin use.     Medications Prior to Admission  Medication Sig Dispense Refill Last Dose  . aspirin EC  81 MG tablet Take 1 tablet (81 mg total) by mouth daily. 60 tablet 2 11/22/2020 at Unknown time  . Capsaicin 0.033 % CREA Apply 1 Film topically 3 (three) times daily as needed. 56.6 g 0 Past Week at Unknown time  . dibucaine (NUPERCAINAL) 1 % OINT Place 1 application rectally as needed for hemorrhoids. 28 g 1 Past Week at Unknown time  . docusate sodium (COLACE) 100 MG capsule Take 1 capsule (100 mg total) by mouth 2 (two) times daily. 60 capsule 2 11/22/2020 at Unknown time  . Doxylamine-Pyridoxine 10-10 MG TBEC Take 2 tablets by mouth at bedtime. 60 tablet 2 11/22/2020 at Unknown time  . metoCLOPramide (REGLAN) 10 MG tablet Take 1 tablet (10 mg total) by mouth every 6 (six) hours. 30 tablet 0 Past Month at Unknown time  . polyethylene glycol (MIRALAX) 17 g packet Take 17 g by mouth daily. Until bowel movement successful. 14 each 0 11/21/2020 at Unknown time  . Prenatal 27-1 MG TABS Take 1 tablet by mouth daily. 30 tablet 12 11/22/2020 at Unknown time  . promethazine (PHENERGAN) 25 MG  tablet Take 1 tablet (25 mg total) by mouth every 6 (six) hours as needed for nausea or vomiting. 30 tablet 0 11/21/2020 at Unknown time  . albuterol (VENTOLIN HFA) 108 (90 Base) MCG/ACT inhaler Inhale 2 puffs into the lungs every 6 (six) hours as needed for wheezing or shortness of breath. 18 g 1 More than a month at Unknown time  . Blood Pressure Monitoring (BLOOD PRESSURE MONITOR AUTOMAT) DEVI 1 Device by Does not apply route daily. Automatic blood pressure cuff regular/x-large size cuff. To monitor blood pressure regularly at home. ICD-10 code: O09.90. (Patient not taking: No sig reported) 1 each 0   . fluticasone (FLOVENT HFA) 110 MCG/ACT inhaler Inhale 2 puffs into the lungs in the morning and at bedtime. (Patient not taking: No sig reported) 12 g 12   . promethazine (PHENERGAN) 12.5 MG tablet Take 1 tablet (12.5 mg total) by mouth every 6 (six) hours as needed for nausea or vomiting. (Patient not taking: No sig reported) 30 tablet 0     Review of Systems  Gastrointestinal: Positive for nausea and vomiting (This morning at 4 or 5 am). Negative for abdominal pain.  Genitourinary: Negative for difficulty urinating, dysuria, vaginal bleeding and vaginal discharge.  Musculoskeletal: Positive for myalgias (Knee pain).   Physical Exam   Blood pressure 115/76, pulse 86, temperature 98 F (36.7 C), temperature source Oral, resp. rate 20, height 6\' 1"  (1.854 m), weight 116.5 kg, last menstrual period 04/08/2020, SpO2 99 %, not currently breastfeeding.  Physical Exam Vitals reviewed. Exam conducted with a chaperone present.  Constitutional:      Appearance: Normal appearance.  HENT:     Head: Normocephalic and atraumatic.  Eyes:     Conjunctiva/sclera: Conjunctivae normal.  Cardiovascular:     Rate and Rhythm: Normal rate and regular rhythm.     Heart sounds: Normal heart sounds.  Pulmonary:     Effort: Pulmonary effort is normal.     Breath sounds: Normal breath sounds.  Abdominal:      General: Bowel sounds are normal.  Genitourinary:   Musculoskeletal:        General: Normal range of motion.     Cervical back: Normal range of motion.  Skin:    General: Skin is warm and dry.  Neurological:     Mental Status: She is alert and oriented to person, place, and time.  Psychiatric:        Mood and Affect: Mood normal.        Behavior: Behavior normal.        Thought Content: Thought content normal.     Fetal Assessment 145 bpm, Mod Var, -Decels, +10x10 Accels Toco: No Ctx graphed  MAU Course  No results found for this or any previous visit (from the past 24 hour(s)). DG Knee 2 Views Right  Result Date: 11/22/2020 CLINICAL DATA:  Knee pain in the anterior RIGHT knee post fall down stairs. No history of prior injuries by report. EXAM: RIGHT KNEE - 1-2 VIEW COMPARISON:  None. FINDINGS: Mild soft tissue swelling over the anterior knee. No sign of joint effusion. No sign of fracture or dislocation. IMPRESSION: Mild soft tissue swelling over the anterior knee. No sign of fracture or dislocation. Electronically Signed   By: Donzetta Kohut M.D.   On: 11/22/2020 14:40    MDM PE Labs: None EFM XRay 2 View I&D Assessment and Plan  30 year old G2P0101  SIUP at 31.3 weeks Cat I FT Knee Injury Boil   -Exam performed and findings discussed. -Will give pain medication for discomfort. Morphine 4mg  IM ordered -Will send for XRay and will complete I&D after results return. -Discussed usage of warm/cold compresses for knee as well as tylenol usage as needed. -NST Reactive. -Will await results and perform procedure.  MSN, CNM 11/22/2020, 1:05 PM   Reassessment (3:59 PM)  Risks and benefits of procedure discussed with patient.  All questions and concerns addressed.  Patient verbalizes understanding and wishes to proceed with procedure.  Consent form signed and Morphine 4mg  IM given.  Time out performed with identification of patient, procedure, and site  location.   Patient placed in lithotomy and anticipated area cleaned with betadine solution.  Sterile drapes then placed to maintain sterile field and ~44mL of Lidocaine 1% was infiltrated locally. After adequate time, the area was tested and found to still show signs of discomfort.  An additional 51mL of Lidocaine 1% was infiltrated resulting in adequate comfort.  A .5cm single incision was made with a #11 blade scalpel at posterior aspect of the boil.  It was then probed with a curved hemostat to disintegrate any developed loculations within the cyst.  A large amount of off-white , malodorous drainage was noted.  The area was again cleaned with betadine swab.  No active drainage noted after completion. The area was not packed and the patient tolerated the procedure well.    Care instructions was given to the patient as well as information for a follow up appointment.  Patient currently taking Clindamycin and was instructed to continue per instructions.   -Encouraged to call or return to MAU if symptoms worsen or with the onset of new symptoms. -Discharged to home in stable condition.   1m, CNM 11/22/2020, 4:31 PM

## 2020-11-23 ENCOUNTER — Ambulatory Visit: Payer: Medicaid Other | Admitting: *Deleted

## 2020-11-23 ENCOUNTER — Ambulatory Visit (INDEPENDENT_AMBULATORY_CARE_PROVIDER_SITE_OTHER): Payer: Medicaid Other | Admitting: Family Medicine

## 2020-11-23 ENCOUNTER — Other Ambulatory Visit: Payer: Self-pay | Admitting: *Deleted

## 2020-11-23 ENCOUNTER — Ambulatory Visit: Payer: Medicaid Other | Attending: Maternal & Fetal Medicine

## 2020-11-23 ENCOUNTER — Encounter: Payer: Self-pay | Admitting: *Deleted

## 2020-11-23 VITALS — BP 109/64 | HR 71 | Wt 252.7 lb

## 2020-11-23 DIAGNOSIS — E669 Obesity, unspecified: Secondary | ICD-10-CM

## 2020-11-23 DIAGNOSIS — O09293 Supervision of pregnancy with other poor reproductive or obstetric history, third trimester: Secondary | ICD-10-CM

## 2020-11-23 DIAGNOSIS — J45909 Unspecified asthma, uncomplicated: Secondary | ICD-10-CM

## 2020-11-23 DIAGNOSIS — O358XX0 Maternal care for other (suspected) fetal abnormality and damage, not applicable or unspecified: Secondary | ICD-10-CM

## 2020-11-23 DIAGNOSIS — O0990 Supervision of high risk pregnancy, unspecified, unspecified trimester: Secondary | ICD-10-CM | POA: Insufficient documentation

## 2020-11-23 DIAGNOSIS — O35BXX0 Maternal care for other (suspected) fetal abnormality and damage, fetal cardiac anomalies, not applicable or unspecified: Secondary | ICD-10-CM

## 2020-11-23 DIAGNOSIS — O99213 Obesity complicating pregnancy, third trimester: Secondary | ICD-10-CM | POA: Diagnosis not present

## 2020-11-23 DIAGNOSIS — O09292 Supervision of pregnancy with other poor reproductive or obstetric history, second trimester: Secondary | ICD-10-CM | POA: Insufficient documentation

## 2020-11-23 DIAGNOSIS — O99323 Drug use complicating pregnancy, third trimester: Secondary | ICD-10-CM

## 2020-11-23 DIAGNOSIS — Z362 Encounter for other antenatal screening follow-up: Secondary | ICD-10-CM | POA: Diagnosis not present

## 2020-11-23 DIAGNOSIS — F1910 Other psychoactive substance abuse, uncomplicated: Secondary | ICD-10-CM

## 2020-11-23 DIAGNOSIS — O09892 Supervision of other high risk pregnancies, second trimester: Secondary | ICD-10-CM | POA: Insufficient documentation

## 2020-11-23 DIAGNOSIS — Z6834 Body mass index (BMI) 34.0-34.9, adult: Secondary | ICD-10-CM | POA: Diagnosis not present

## 2020-11-23 DIAGNOSIS — Z3A31 31 weeks gestation of pregnancy: Secondary | ICD-10-CM

## 2020-11-23 DIAGNOSIS — Z862 Personal history of diseases of the blood and blood-forming organs and certain disorders involving the immune mechanism: Secondary | ICD-10-CM

## 2020-11-23 DIAGNOSIS — R569 Unspecified convulsions: Secondary | ICD-10-CM

## 2020-11-23 DIAGNOSIS — O99210 Obesity complicating pregnancy, unspecified trimester: Secondary | ICD-10-CM

## 2020-11-23 MED ORDER — ASPIRIN EC 81 MG PO TBEC: 81.0000 mg | DELAYED_RELEASE_TABLET | Freq: Every day | ORAL | 2 refills | Status: DC

## 2020-11-23 MED ORDER — FERROUS SULFATE 325 (65 FE) MG PO TABS: 325.0000 mg | ORAL_TABLET | ORAL | 3 refills | Status: DC

## 2020-11-23 NOTE — Progress Notes (Signed)
   PRENATAL VISIT NOTE  Subjective:  Leslie Yates is a 30 y.o. G2P0101 at [redacted]w[redacted]d being seen today for ongoing prenatal care.  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  Patient reports no complaints.  Contractions: Not present. Vag. Bleeding: None.  Movement: Present. Denies leaking of fluid.   The following portions of the patient's history were reviewed and updated as appropriate: allergies, current medications, past family history, past medical history, past social history, past surgical history and problem list.   Objective:   Vitals:   11/23/20 1553  BP: 109/64  Pulse: 71  Weight: 252 lb 11.2 oz (114.6 kg)    Fetal Status: Fetal Heart Rate (bpm): 150   Movement: Present     General:  Alert, oriented and cooperative. Patient is in no acute distress.  Skin: Skin is warm and dry. No rash noted.   Cardiovascular: Normal heart rate noted  Respiratory: Normal respiratory effort, no problems with respiration noted  Abdomen: Soft, gravid, appropriate for gestational age.  Pain/Pressure: Present     Pelvic: Cervical exam deferred        Extremities: Normal range of motion.  Edema: Trace  Mental Status: Normal mood and affect. Normal behavior. Normal judgment and thought content.   Assessment and Plan:  Pregnancy: G2P0101 at [redacted]w[redacted]d 1. Supervision of high risk pregnancy, antepartum FHT and FH normal  2. Hx of HELLP syndrome, currently pregnant, second trimester Taking ASA 81mg   3. 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 Will continue to follow  4. Hx of preterm delivery, currently pregnant, second trimester Not on makena  5. Seizure (HCC) H/o seizures. No seizures since last baby. Does not need to be on antiepileptics.  6. Uncomplicated asthma, unspecified asthma severity, unspecified whether persistent controlled  Preterm labor symptoms and general obstetric precautions including but not limited to vaginal bleeding, contractions, leaking of fluid and fetal movement were reviewed in detail with the patient. Please refer to After Visit Summary for other counseling recommendations.   Return in about 2 weeks (around 12/07/2020) for HR OB f/u, In Office.  Future Appointments  Date Time Provider Department Center  12/07/2020  1:35 PM 12/09/2020, MD [facility]  12/28/2020  2:45 PM [facility] NURSE [facility]  12/28/2020  3:00 PM [facility] US1 [facility]    [facility] DO

## 2020-11-24 ENCOUNTER — Other Ambulatory Visit: Payer: Self-pay | Admitting: *Deleted

## 2020-11-24 ENCOUNTER — Encounter: Payer: Self-pay | Admitting: *Deleted

## 2020-11-24 DIAGNOSIS — Z6833 Body mass index (BMI) 33.0-33.9, adult: Secondary | ICD-10-CM

## 2020-11-24 MED ORDER — SCOPOLAMINE 1 MG/3DAYS TD PT72: 1.0000 | MEDICATED_PATCH | TRANSDERMAL | 2 refills | Status: DC

## 2020-11-24 NOTE — Progress Notes (Signed)
Refilled

## 2020-12-07 ENCOUNTER — Ambulatory Visit (INDEPENDENT_AMBULATORY_CARE_PROVIDER_SITE_OTHER): Payer: Medicaid Other | Admitting: Family Medicine

## 2020-12-07 ENCOUNTER — Encounter: Payer: Self-pay | Admitting: Family Medicine

## 2020-12-07 ENCOUNTER — Other Ambulatory Visit: Payer: Self-pay

## 2020-12-07 VITALS — BP 118/71 | HR 108 | Wt 250.9 lb

## 2020-12-07 DIAGNOSIS — O358XX0 Maternal care for other (suspected) fetal abnormality and damage, not applicable or unspecified: Secondary | ICD-10-CM

## 2020-12-07 DIAGNOSIS — Z720 Tobacco use: Secondary | ICD-10-CM

## 2020-12-07 DIAGNOSIS — O09892 Supervision of other high risk pregnancies, second trimester: Secondary | ICD-10-CM

## 2020-12-07 DIAGNOSIS — O35BXX0 Maternal care for other (suspected) fetal abnormality and damage, fetal cardiac anomalies, not applicable or unspecified: Secondary | ICD-10-CM

## 2020-12-07 DIAGNOSIS — O99210 Obesity complicating pregnancy, unspecified trimester: Secondary | ICD-10-CM

## 2020-12-07 DIAGNOSIS — O09292 Supervision of pregnancy with other poor reproductive or obstetric history, second trimester: Secondary | ICD-10-CM

## 2020-12-07 DIAGNOSIS — J45909 Unspecified asthma, uncomplicated: Secondary | ICD-10-CM

## 2020-12-07 DIAGNOSIS — K5900 Constipation, unspecified: Secondary | ICD-10-CM

## 2020-12-07 DIAGNOSIS — O0990 Supervision of high risk pregnancy, unspecified, unspecified trimester: Secondary | ICD-10-CM

## 2020-12-07 DIAGNOSIS — R569 Unspecified convulsions: Secondary | ICD-10-CM

## 2020-12-07 DIAGNOSIS — N83291 Other ovarian cyst, right side: Secondary | ICD-10-CM

## 2020-12-07 MED ORDER — DOCUSATE SODIUM 100 MG PO CAPS: 100.0000 mg | ORAL_CAPSULE | Freq: Two times a day (BID) | ORAL | 2 refills | Status: DC

## 2020-12-07 MED ORDER — PROMETHAZINE HCL 25 MG PO TABS: 25.0000 mg | ORAL_TABLET | Freq: Four times a day (QID) | ORAL | 5 refills | Status: DC | PRN

## 2020-12-07 MED ORDER — PROMETHAZINE HCL 25 MG RE SUPP
25.0000 mg | Freq: Four times a day (QID) | RECTAL | 2 refills | Status: DC | PRN
Start: 2020-12-07 — End: 2021-01-19

## 2020-12-07 NOTE — Patient Instructions (Signed)

## 2020-12-07 NOTE — Progress Notes (Signed)
Pt states has a lot of Mucous D/C

## 2020-12-07 NOTE — Progress Notes (Signed)
Subjective:  Leslie Yates is a 30 y.o. G2P0101 at [redacted]w[redacted]d being seen today for ongoing prenatal care.  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  Patient reports nausea.  Contractions: Not present. Vag. Bleeding: None.  Movement: Present. Denies leaking of fluid.   The following portions of the patient's history were reviewed and updated as appropriate: allergies, current medications, past family history, past medical history, past social history, past surgical history and problem list. Problem list updated.  Objective:   Vitals:   12/07/20 1350  BP: 118/71  Pulse: (!) 108  Weight: 250 lb 14.4 oz (113.8 kg)    Fetal Status: Fetal Heart Rate (bpm): 164   Movement: Present     General:  Alert, oriented and cooperative. Patient is in no acute distress.  Skin: Skin is warm and dry. No rash noted.   Cardiovascular: Normal heart rate noted  Respiratory: Normal respiratory effort, no problems with respiration noted  Abdomen: Soft, gravid, appropriate for gestational age. Pain/Pressure: Present     Pelvic: Vag. Bleeding: None Vag D/C Character: Mucous   Cervical exam deferred        Extremities: Normal range of motion.  Edema: None  Mental Status: Normal mood and affect. Normal behavior. Normal judgment and thought content.   Urinalysis:      Assessment and Plan:  Pregnancy: G2P0101 at [redacted]w[redacted]d  1. Supervision of high risk pregnancy, antepartum BP and FHR normal Patient is actively vomiting in exam room and feels awful, has been happening for the past two  days She has been unable to keep any food or water down Reports weight loss, has indeed had a 7lb weight loss over the past month Not sure what triggered it except forgetting to take diclegis two nights ago, also ran out of phenergan recently Discussed that she should probably go to the [facility] given weight loss and inability to take PO but patient strongly desires to avoid this Discussed we could do phenergan suppository and see if this could break the cycle and help her keep down her diclegis and other meds 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 Uncomplicated asthma, unspecified asthma severity, unspecified whether persistent Stable, no flares recently  4. Tobacco use Still at a few a day, encouraged to quit  5. Seizure (HCC) None since last pregnancy  6. Obesity in pregnancy, antepartum   7. Hx of preterm delivery, currently pregnant, second trimester Not on makena  8. Hx of HELLP syndrome, currently pregnant, second trimester On ASA  9. Complex cyst of right ovary Measuring 12-13 cm, consider removal post partum  Preterm labor symptoms and general obstetric precautions including but not limited to vaginal bleeding, contractions, leaking of fluid and fetal movement were reviewed in detail with the patient. Please refer to After Visit Summary for other counseling recommendations.  Return in 2 weeks (on 12/21/2020) for [facility], ob visit.  Clarnce Flock, MD

## 2020-12-15 ENCOUNTER — Other Ambulatory Visit: Payer: Self-pay

## 2020-12-15 ENCOUNTER — Ambulatory Visit (INDEPENDENT_AMBULATORY_CARE_PROVIDER_SITE_OTHER): Payer: Medicaid Other | Admitting: Obstetrics & Gynecology

## 2020-12-15 VITALS — BP 101/68 | HR 70 | Wt 253.4 lb

## 2020-12-15 DIAGNOSIS — Z3400 Encounter for supervision of normal first pregnancy, unspecified trimester: Secondary | ICD-10-CM

## 2020-12-15 DIAGNOSIS — O219 Vomiting of pregnancy, unspecified: Secondary | ICD-10-CM

## 2020-12-15 DIAGNOSIS — N83291 Other ovarian cyst, right side: Secondary | ICD-10-CM

## 2020-12-15 DIAGNOSIS — O09892 Supervision of other high risk pregnancies, second trimester: Secondary | ICD-10-CM

## 2020-12-15 DIAGNOSIS — O0990 Supervision of high risk pregnancy, unspecified, unspecified trimester: Secondary | ICD-10-CM

## 2020-12-15 DIAGNOSIS — O99210 Obesity complicating pregnancy, unspecified trimester: Secondary | ICD-10-CM

## 2020-12-15 DIAGNOSIS — R569 Unspecified convulsions: Secondary | ICD-10-CM

## 2020-12-15 MED ORDER — PANTOPRAZOLE SODIUM 40 MG PO TBEC
40.0000 mg | DELAYED_RELEASE_TABLET | Freq: Every day | ORAL | 1 refills | Status: DC
Start: 2020-12-15 — End: 2021-01-19

## 2020-12-15 MED ORDER — BLOOD PRESSURE MONITOR AUTOMAT DEVI: 1.0000 | Freq: Every day | 0 refills | Status: DC

## 2020-12-15 MED ORDER — SUCRALFATE 1 GM/10ML PO SUSP: 1.0000 g | Freq: Three times a day (TID) | ORAL | 0 refills | Status: DC

## 2020-12-15 NOTE — Patient Instructions (Signed)
Hyperemesis Gravidarum Hyperemesis gravidarum is a severe form of nausea and vomiting that happens during pregnancy. Hyperemesis is worse than morning sickness. It may cause you to have nausea or vomiting all day for many days. It may keep you from eating and drinking enough food and liquids, which can lead to dehydration, malnutrition, and weight loss. Hyperemesis usually occurs during the first half (the first 20 weeks) of pregnancy. It often goes away once a woman is in her second half of pregnancy. However, sometimes hyperemesis continues through anentire pregnancy. What are the causes? The cause of this condition is not known. It may be associated with: Changes in hormones in the body during pregnancy. Changes in the gastrointestinal system. Genetic or inherited conditions. What are the signs or symptoms? Symptoms of this condition include: Severe nausea and vomiting that does not go away. Problems keeping food down. Weight loss. Loss of body fluid (dehydration). Loss of appetite. You may have no desire to eat or you may not like the food you have previously enjoyed. How is this diagnosed? This condition may be diagnosed based on your medical history, your symptoms,and a physical exam. You may also have other tests, including: Blood tests. Urine tests. Blood pressure tests. Ultrasound to look for problems with the placenta or to check if you are pregnant with more than one baby. How is this treated? This condition is managed by controlling symptoms. This may include: Following an eating plan. This can help to lessen nausea and vomiting. Treatments that do not use medicine. These include acupressure bracelets, hypnosis, and eating or drinking foods or fluids that contain ginger, ginger ale, or ginger tea. Taking prescription medicine or over-the-counter medicine as told by your health care provider. Continuing to take prenatal vitamins. You may need to change what kind you take and when  you take them. Follow your health care provider's instructions about prenatal vitamins. An eating plan and medicines are often used together to help control symptoms. If medicines do not help relieve nausea and vomiting, you may need to receivefluids through an IV at the hospital. Follow these instructions at home: To help relieve your symptoms, listen to your body. Everyone is different and has different preferences. Find what works best for you. Here are some thingsyou can try to help relieve your symptoms: Meals and snacks  Eat 5-6 small meals daily instead of 3 large meals. Eating small meals and snacks can help you avoid an empty stomach. Before getting out of bed, eat a couple of crackers to avoid moving around on an empty stomach. Eat a protein-rich snack before bed. Examples include cheese and crackers, or a peanut butter sandwich made with 1 slice of whole-wheat bread and 1 tsp (5 g) of peanut butter. Eat and drink slowly. Try eating starchy foods as these are usually tolerated well. Examples include cereal, toast, bread, potatoes, pasta, rice, and pretzels. Eat at least one serving of protein with your meals and snacks. Protein options include lean meats, poultry, seafood, beans, nuts, nut butters, eggs, cheese, and yogurt. Eat or suck on things that have ginger in them. It may help to relieve nausea. Add  tsp (0.44 g) ground ginger to hot tea, or choose ginger tea.  Fluids It is important to stay hydrated. Try to: Drink small amounts of fluids often. Drink fluids 30 minutes before or after a meal to help lessen the feeling of a full stomach. Drink 100% fruit juice or an electrolyte drink. An electrolyte drink contains sodium, potassium, and   chloride. Drink fluids that are cold, clear, and carbonated or sour. These include lemonade, ginger ale, lemon-lime soda, ice water, and sparkling water. Things to avoid Avoid the following: Eating foods that trigger your symptoms. These may  include spicy foods, coffee, high-fat foods, very sweet foods, and acidic foods. Drinking more than 1 cup of fluid at a time. Skipping meals. Nausea can be more intense on an empty stomach. If you cannot tolerate food, do not force it. Try sucking on ice chips or other frozen items and make up for missed calories later. Lying down within 2 hours after eating. Being exposed to environmental triggers. These may include food smells, smoky rooms, closed spaces, rooms with strong smells, warm or humid places, overly loud and noisy rooms, and rooms with motion or flickering lights. Try eating meals in a well-ventilated area that is free of strong smells. Making quick and sudden changes in your movement. Taking iron pills and multivitamins that contain iron. If you take prescription iron pills, do not stop taking them unless your health care provider approves. Preparing food. The smell of food can spoil your appetite or trigger nausea. General instructions Brush your teeth or use a mouth rinse after meals. Take over-the-counter and prescription medicines only as told by your health care provider. Follow instructions from your health care provider about eating or drinking restrictions. Talk with your health care provider about starting a supplement of vitamin B6. Continue to take your prenatal vitamins as told by your health care provider. If you are having trouble taking your prenatal vitamins, talk with your health care provider about other options. Keep all follow-up visits. This is important. Follow-up visits include prenatal visits. Contact a health care provider if: You have pain in your abdomen. You have a severe headache. You have vision problems. You are losing weight. You feel weak or dizzy. You cannot eat or drink without vomiting, especially if this goes on for a full day. Get help right away if: You cannot drink fluids without vomiting. You vomit blood. You have constant nausea and  vomiting. You are very weak. You faint. You have a fever and your symptoms suddenly get worse. Summary Hyperemesis gravidarum is a severe form of nausea and vomiting that happens during pregnancy. Making some changes to your eating habits may help relieve nausea and vomiting. This condition may be managed with lifestyle changes and medicines as prescribed by your health care provider. If medicines do not help relieve nausea and vomiting, you may need to receive fluids through an IV at the hospital. This information is not intended to replace advice given to you by your health care provider. Make sure you discuss any questions you have with your healthcare provider. Document Revised: 01/04/2020 Document Reviewed: 01/04/2020 Elsevier Patient Education  2022 [employer]  

## 2020-12-15 NOTE — Progress Notes (Signed)
   PRENATAL VISIT NOTE  Subjective:  Leslie Yates is a 30 y.o. G2P0101 at [redacted]w[redacted]d being seen today for ongoing prenatal care.  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  Patient reports heartburn, nausea, and vomiting.  Contractions: Irritability. Vag. Bleeding: None.  Movement: Present. Denies leaking of fluid.   The following portions of the patient's history were reviewed and updated as appropriate: allergies, current medications, past family history, past medical history, past social history, past surgical history and problem list.   Objective:   Vitals:   12/15/20 1608  BP: 101/68  Pulse: 70  Weight: 253 lb 6.4 oz (114.9 kg)    Fetal Status: Fetal Heart Rate (bpm): 154   Movement: Present     General:  Alert, oriented and cooperative. Patient is in no acute distress.  Skin: Skin is warm and dry. No rash noted.   Cardiovascular: Normal heart rate noted  Respiratory: Normal respiratory effort, no problems with respiration noted  Abdomen: Soft, gravid, appropriate for gestational age.  Pain/Pressure: Present     Pelvic: Cervical exam deferred        Extremities: Normal range of motion.  Edema: None  Mental Status: Normal mood and affect. Normal behavior. Normal judgment and thought content.   Assessment and Plan:  Pregnancy: G2P0101 at [redacted]w[redacted]d 1. Hx of preterm delivery, currently pregnant, second trimester   2. Supervision of high risk pregnancy, antepartum Change medication for GERD N&V - pantoprazole  (PROTONIX) 40 MG tablet; Take 1 tablet (40 mg total) by mouth daily.  Dispense: 30 tablet; Refill: 1 - Blood Pressure Monitoring (BLOOD PRESSURE MONITOR AUTOMAT) DEVI; 1 Device by Does not apply route daily. Automatic blood pressure cuff regular/x-large size cuff. To monitor blood pressure regularly at home. ICD-10 code: O09.90.  Dispense: 1 each; Refill: 0  3. Obesity in pregnancy, antepartum   4. Seizure (HCC) No seizures  5. Complex cyst of right ovary F/U post delivery  6. Supervision of normal first pregnancy, antepartum  - Blood Pressure Monitoring (BLOOD PRESSURE MONITOR AUTOMAT) DEVI; 1 Device by Does not apply route daily. Automatic blood pressure cuff regular/x-large size cuff. To monitor blood pressure regularly at home. ICD-10 code: O09.90.  Dispense: 1 each; Refill: 0  7. Nausea and vomiting during pregnancy  - sucralfate (CARAFATE) 1 GM/10ML suspension; Take 10 mLs (1 g total) by mouth 4 (four) times daily -  with meals and at bedtime.  Dispense: 420 mL; Refill: 0  Preterm labor symptoms and general obstetric precautions including but not limited to vaginal bleeding, contractions, leaking of fluid and fetal movement were reviewed in detail with the patient. Please refer to After Visit Summary for other counseling recommendations.   Return in about 1 week (around 12/22/2020).  Future Appointments  Date Time Provider Department Center  12/28/2020  2:45 PM [facility] NURSE [facility]  12/28/2020  3:00 PM [facility]    Scheryl Darter, MD

## 2020-12-22 ENCOUNTER — Encounter: Payer: Medicaid Other | Admitting: Obstetrics and Gynecology

## 2020-12-27 DIAGNOSIS — L732 Hidradenitis suppurativa: Secondary | ICD-10-CM | POA: Diagnosis not present

## 2020-12-28 ENCOUNTER — Other Ambulatory Visit: Payer: Self-pay

## 2020-12-28 ENCOUNTER — Ambulatory Visit: Payer: Medicaid Other | Admitting: *Deleted

## 2020-12-28 ENCOUNTER — Ambulatory Visit (INDEPENDENT_AMBULATORY_CARE_PROVIDER_SITE_OTHER): Payer: Medicaid Other | Admitting: Obstetrics & Gynecology

## 2020-12-28 ENCOUNTER — Other Ambulatory Visit (HOSPITAL_COMMUNITY)
Admission: RE | Admit: 2020-12-28 | Discharge: 2020-12-28 | Disposition: A | Discharge status: Home / Self Care | Payer: Medicaid Other | Source: Ambulatory Visit | Attending: Obstetrics & Gynecology | Admitting: Obstetrics & Gynecology

## 2020-12-28 ENCOUNTER — Encounter: Payer: Self-pay | Admitting: *Deleted

## 2020-12-28 ENCOUNTER — Ambulatory Visit: Payer: Medicaid Other | Attending: Obstetrics

## 2020-12-28 VITALS — BP 97/55 | HR 75

## 2020-12-28 VITALS — BP 107/60 | HR 63 | Wt 262.9 lb

## 2020-12-28 DIAGNOSIS — O358XX0 Maternal care for other (suspected) fetal abnormality and damage, not applicable or unspecified: Secondary | ICD-10-CM | POA: Insufficient documentation

## 2020-12-28 DIAGNOSIS — O09292 Supervision of pregnancy with other poor reproductive or obstetric history, second trimester: Secondary | ICD-10-CM | POA: Insufficient documentation

## 2020-12-28 DIAGNOSIS — O0990 Supervision of high risk pregnancy, unspecified, unspecified trimester: Secondary | ICD-10-CM

## 2020-12-28 DIAGNOSIS — O0993 Supervision of high risk pregnancy, unspecified, third trimester: Secondary | ICD-10-CM | POA: Insufficient documentation

## 2020-12-28 DIAGNOSIS — E669 Obesity, unspecified: Secondary | ICD-10-CM

## 2020-12-28 DIAGNOSIS — O09293 Supervision of pregnancy with other poor reproductive or obstetric history, third trimester: Secondary | ICD-10-CM | POA: Diagnosis not present

## 2020-12-28 DIAGNOSIS — Z862 Personal history of diseases of the blood and blood-forming organs and certain disorders involving the immune mechanism: Secondary | ICD-10-CM

## 2020-12-28 DIAGNOSIS — Z3A36 36 weeks gestation of pregnancy: Secondary | ICD-10-CM | POA: Diagnosis not present

## 2020-12-28 DIAGNOSIS — O99213 Obesity complicating pregnancy, third trimester: Secondary | ICD-10-CM | POA: Insufficient documentation

## 2020-12-28 DIAGNOSIS — O289 Unspecified abnormal findings on antenatal screening of mother: Secondary | ICD-10-CM | POA: Diagnosis not present

## 2020-12-28 DIAGNOSIS — O3483 Maternal care for other abnormalities of pelvic organs, third trimester: Secondary | ICD-10-CM | POA: Diagnosis not present

## 2020-12-28 DIAGNOSIS — Z6833 Body mass index (BMI) 33.0-33.9, adult: Secondary | ICD-10-CM

## 2020-12-28 DIAGNOSIS — O35BXX0 Maternal care for other (suspected) fetal abnormality and damage, fetal cardiac anomalies, not applicable or unspecified: Secondary | ICD-10-CM

## 2020-12-28 DIAGNOSIS — O09892 Supervision of other high risk pregnancies, second trimester: Secondary | ICD-10-CM

## 2020-12-28 DIAGNOSIS — Z362 Encounter for other antenatal screening follow-up: Secondary | ICD-10-CM | POA: Diagnosis not present

## 2020-12-28 DIAGNOSIS — F1910 Other psychoactive substance abuse, uncomplicated: Secondary | ICD-10-CM

## 2020-12-28 DIAGNOSIS — N83201 Unspecified ovarian cyst, right side: Secondary | ICD-10-CM | POA: Insufficient documentation

## 2020-12-28 DIAGNOSIS — O99323 Drug use complicating pregnancy, third trimester: Secondary | ICD-10-CM

## 2020-12-28 NOTE — Progress Notes (Signed)
Patient complains of pressure on pelvis

## 2020-12-28 NOTE — Progress Notes (Signed)
   PRENATAL VISIT NOTE  Subjective:  Leslie Yates is a 30 y.o. G2P0101 at [redacted]w[redacted]d being seen today for ongoing prenatal care.  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  Patient reports nausea.  Contractions: Not present. Vag. Bleeding: None.  Movement: Present. Denies leaking of fluid.   The following portions of the patient's history were reviewed and updated as appropriate: allergies, current medications, past family history, past medical history, past social history, past surgical history and problem list.   Objective:   Vitals:   12/28/20 1451  BP: 107/60  Pulse: 63  Weight: 262 lb 14.4 oz (119.3 kg)    Fetal Status: Fetal Heart Rate (bpm): 152   Movement: Present  Presentation: Vertex  General:  Alert, oriented and cooperative. Patient is in no acute distress.  Skin: Skin is warm and dry. No rash noted.   Cardiovascular: Normal heart rate noted  Respiratory: Normal respiratory effort, no problems with respiration noted  Abdomen: Soft, gravid, appropriate for gestational age.  Pain/Pressure: Present     Pelvic: Cervical exam performed in the presence of a chaperone Dilation: Fingertip Effacement (%): 50 Station: -3  Extremities: Normal range of motion.  Edema: Trace  Mental Status: Normal mood and affect. Normal behavior. Normal judgment and thought content.   Assessment and Plan:  Pregnancy: G2P0101 at [redacted]w[redacted]d 1. Anomaly of heart of fetus affecting pregnancy, antepartum, single or unspecified fetus Minor  VSD  2. Supervision of high risk pregnancy, antepartum   3. Hx of HELLP syndrome, currently pregnant, second trimester Nl BP  4. [redacted] weeks gestation of pregnancy Routine screen - Strep Gp B Culture+Rflx - Cervicovaginal ancillary only( [place])  Preterm labor symptoms and general obstetric precautions including but not limited to vaginal bleeding, contractions, leaking of fluid and fetal movement were reviewed in detail with the patient. Please refer to After Visit Summary for other counseling recommendations.   Return in about 1 week (around 01/04/2021).  No future appointments.  Scheryl Darter, MD

## 2020-12-29 LAB — CERVICOVAGINAL ANCILLARY ONLY
Chlamydia: NEGATIVE
Comment: NEGATIVE
Comment: NORMAL
Neisseria Gonorrhea: NEGATIVE

## 2021-01-01 LAB — STREP GP B CULTURE+RFLX: Strep Gp B Culture+Rflx: NEGATIVE

## 2021-01-04 ENCOUNTER — Inpatient Hospital Stay (HOSPITAL_COMMUNITY)
Admission: AD | Admit: 2021-01-04 | Discharge: 2021-01-04 | Disposition: A | Discharge status: Home / Self Care | Payer: Medicaid Other | Attending: Obstetrics & Gynecology | Admitting: Obstetrics & Gynecology

## 2021-01-04 ENCOUNTER — Encounter (HOSPITAL_COMMUNITY): Payer: Self-pay | Admitting: Obstetrics & Gynecology

## 2021-01-04 ENCOUNTER — Ambulatory Visit (INDEPENDENT_AMBULATORY_CARE_PROVIDER_SITE_OTHER): Payer: Medicaid Other | Admitting: Obstetrics and Gynecology

## 2021-01-04 ENCOUNTER — Encounter: Payer: Self-pay | Admitting: Obstetrics and Gynecology

## 2021-01-04 ENCOUNTER — Other Ambulatory Visit: Payer: Self-pay

## 2021-01-04 VITALS — BP 115/67 | HR 71 | Wt 263.5 lb

## 2021-01-04 DIAGNOSIS — F17210 Nicotine dependence, cigarettes, uncomplicated: Secondary | ICD-10-CM | POA: Insufficient documentation

## 2021-01-04 DIAGNOSIS — O99323 Drug use complicating pregnancy, third trimester: Secondary | ICD-10-CM | POA: Diagnosis not present

## 2021-01-04 DIAGNOSIS — O09292 Supervision of pregnancy with other poor reproductive or obstetric history, second trimester: Secondary | ICD-10-CM

## 2021-01-04 DIAGNOSIS — O99613 Diseases of the digestive system complicating pregnancy, third trimester: Secondary | ICD-10-CM | POA: Diagnosis not present

## 2021-01-04 DIAGNOSIS — Z5941 Food insecurity: Secondary | ICD-10-CM

## 2021-01-04 DIAGNOSIS — Z3A37 37 weeks gestation of pregnancy: Secondary | ICD-10-CM

## 2021-01-04 DIAGNOSIS — Z881 Allergy status to other antibiotic agents status: Secondary | ICD-10-CM | POA: Diagnosis not present

## 2021-01-04 DIAGNOSIS — Z7982 Long term (current) use of aspirin: Secondary | ICD-10-CM | POA: Diagnosis not present

## 2021-01-04 DIAGNOSIS — Z7951 Long term (current) use of inhaled steroids: Secondary | ICD-10-CM | POA: Insufficient documentation

## 2021-01-04 DIAGNOSIS — O26893 Other specified pregnancy related conditions, third trimester: Secondary | ICD-10-CM | POA: Diagnosis not present

## 2021-01-04 DIAGNOSIS — Z1331 Encounter for screening for depression: Secondary | ICD-10-CM

## 2021-01-04 DIAGNOSIS — R102 Pelvic and perineal pain: Secondary | ICD-10-CM | POA: Diagnosis not present

## 2021-01-04 DIAGNOSIS — O0990 Supervision of high risk pregnancy, unspecified, unspecified trimester: Secondary | ICD-10-CM

## 2021-01-04 DIAGNOSIS — O99891 Other specified diseases and conditions complicating pregnancy: Secondary | ICD-10-CM | POA: Diagnosis not present

## 2021-01-04 DIAGNOSIS — O212 Late vomiting of pregnancy: Secondary | ICD-10-CM | POA: Insufficient documentation

## 2021-01-04 DIAGNOSIS — Z0371 Encounter for suspected problem with amniotic cavity and membrane ruled out: Secondary | ICD-10-CM

## 2021-01-04 DIAGNOSIS — K649 Unspecified hemorrhoids: Secondary | ICD-10-CM | POA: Diagnosis not present

## 2021-01-04 DIAGNOSIS — Z79899 Other long term (current) drug therapy: Secondary | ICD-10-CM | POA: Insufficient documentation

## 2021-01-04 DIAGNOSIS — Z880 Allergy status to penicillin: Secondary | ICD-10-CM | POA: Insufficient documentation

## 2021-01-04 DIAGNOSIS — R197 Diarrhea, unspecified: Secondary | ICD-10-CM | POA: Diagnosis not present

## 2021-01-04 DIAGNOSIS — O99513 Diseases of the respiratory system complicating pregnancy, third trimester: Secondary | ICD-10-CM | POA: Diagnosis not present

## 2021-01-04 DIAGNOSIS — R1030 Lower abdominal pain, unspecified: Secondary | ICD-10-CM | POA: Insufficient documentation

## 2021-01-04 DIAGNOSIS — O2243 Hemorrhoids in pregnancy, third trimester: Secondary | ICD-10-CM | POA: Diagnosis not present

## 2021-01-04 DIAGNOSIS — F1290 Cannabis use, unspecified, uncomplicated: Secondary | ICD-10-CM | POA: Diagnosis not present

## 2021-01-04 DIAGNOSIS — O35BXX0 Maternal care for other (suspected) fetal abnormality and damage, fetal cardiac anomalies, not applicable or unspecified: Secondary | ICD-10-CM

## 2021-01-04 DIAGNOSIS — J45909 Unspecified asthma, uncomplicated: Secondary | ICD-10-CM

## 2021-01-04 DIAGNOSIS — R109 Unspecified abdominal pain: Secondary | ICD-10-CM

## 2021-01-04 DIAGNOSIS — O09892 Supervision of other high risk pregnancies, second trimester: Secondary | ICD-10-CM

## 2021-01-04 DIAGNOSIS — O358XX0 Maternal care for other (suspected) fetal abnormality and damage, not applicable or unspecified: Secondary | ICD-10-CM

## 2021-01-04 LAB — URINALYSIS, ROUTINE W REFLEX MICROSCOPIC
Bacteria, UA: NONE SEEN
Bilirubin Urine: NEGATIVE
Glucose, UA: NEGATIVE mg/dL
Hgb urine dipstick: NEGATIVE
Ketones, ur: NEGATIVE mg/dL
Leukocytes,Ua: NEGATIVE
Nitrite: NEGATIVE
Protein, ur: 30 mg/dL — AB
Specific Gravity, Urine: 1.015 (ref 1.005–1.030)
pH: 7 (ref 5.0–8.0)

## 2021-01-04 LAB — WET PREP, GENITAL
Clue Cells Wet Prep HPF POC: NONE SEEN
Sperm: NONE SEEN
Trich, Wet Prep: NONE SEEN
Yeast Wet Prep HPF POC: NONE SEEN

## 2021-01-04 LAB — POCT FERN TEST: POCT Fern Test: NEGATIVE

## 2021-01-04 MED ORDER — CYCLOBENZAPRINE HCL 10 MG PO TABS: 10.0000 mg | ORAL_TABLET | Freq: Two times a day (BID) | ORAL | 0 refills | Status: DC | PRN

## 2021-01-04 MED ORDER — CYCLOBENZAPRINE HCL 5 MG PO TABS
10.0000 mg | ORAL_TABLET | Freq: Once | ORAL | Status: AC
  Administered 2021-01-04: 10 mg via ORAL
  Filled 2021-01-04: qty 2

## 2021-01-04 MED ORDER — ACETAMINOPHEN 325 MG PO TABS
650.0000 mg | ORAL_TABLET | Freq: Once | ORAL | Status: AC
  Administered 2021-01-04: 650 mg via ORAL
  Filled 2021-01-04: qty 2

## 2021-01-04 MED ORDER — ACETAMINOPHEN 325 MG PO TABS: 650.0000 mg | ORAL_TABLET | ORAL | 2 refills | Status: AC | PRN

## 2021-01-04 NOTE — MAU Provider Note (Addendum)
History     CSN: [id number]  Arrival date and time: 01/04/21 6294   Event Date/Time   First Provider Initiated Contact with Patient 01/04/21 1126      Chief Complaint  Patient presents with   Contractions   HPI  Ms. Leslie Yates is a 30 yo G2P0101 at [redacted]w[redacted]d presenting for lower abdominal/pelvic "pressure" and diarrhea. She has accompanied nausea and vomiting which she states has been going on throughout her whole pregnancy. When asked about the pressure, she states she has had this for months but it is getting a little more intense. She is unable to say if the pressure feels like contractions as she "didn't experience contractions" during her first pregnancy. The patient also reports a potential episode of vaginal fluid loss this morning in the shower. Since she was in the shower, she was not able to see the "fluid loss." She reports performing a self-swab with her fingers after and did not see any discharge or blood. The "diarrhea" began about 3 days ago. The patient says she has had to use an enema throughout her entire pregnancy but she has not recently. Her stools have been softer, but she denies watery consistency or blood in the stools. Additionally, she reports her hemorrhoids have worsened. She denies fever, dysuria, back pain, or bloody vaginal discharge.   OB History     Gravida  2   Para  1   Term  0   Preterm  1   AB  0   Living  1      SAB  0   IAB  0   Ectopic  0   Multiple  0   Live Births  1           Past Medical History:  Diagnosis Date   Asthma    Cyclical vomiting with nausea 10/27/2018   ED visits for vomiting and diarrhea since 2009-13 At least 4-5 references witnessing patient inducing vomiting by sticking fingers down throat Has had difficulty obtaining meds through the years GI consult made but never went   Elevated liver enzymes 11/19/2019   Gestational hypertension 11/17/2019   IOL @ 37 weeks   Lump of breast 06/12/2019   Obesity    Ovarian  cyst    Sickle cell trait (HCC)     Past Surgical History:  Procedure Laterality Date   INCISION AND DRAINAGE ABSCESS      Family History  Problem Relation Age of Onset   Diabetes Mother    Hypertension Mother    Diabetes Father    Hypertension Father     Social History   Tobacco Use   Smoking status: Every Day    Packs/day: 0.25    Years: 11.00    Pack years: 2.75    Types: Cigarettes   Smokeless tobacco: Never  Vaping Use   Vaping Use: Never used  Substance Use Topics   Alcohol use: Not Currently    Comment: occ   Drug use: Yes    Types: Marijuana    Comment: last used 12/31/2020    Allergies:  Allergies  Allergen Reactions   Penicillins Rash    Childhood rash, Tolerated cefazolin (2021) Has patient had a PCN reaction causing immediate rash, facial/tongue/throat swelling, SOB or lightheadedness with hypotension: no Has patient had a PCN reaction causing severe rash involving mucus membranes or skin necrosis:no Has patient had a PCN reaction that required hospitalization no Has patient had a PCN reaction occurring within the last 10  years: no If all of the above answers are "NO", then may proceed with Cephalosporin use.    Medications Prior to Admission  Medication Sig Dispense Refill Last Dose   albuterol (VENTOLIN HFA) 108 (90 Base) MCG/ACT inhaler Inhale 2 puffs into the lungs every 6 (six) hours as needed for wheezing or shortness of breath. 18 g 1    aspirin EC 81 MG tablet Take 1 tablet (81 mg total) by mouth daily. 60 tablet 2    Blood Pressure Monitoring (BLOOD PRESSURE MONITOR AUTOMAT) DEVI 1 Device by Does not apply route daily. Automatic blood pressure cuff regular/x-large size cuff. To monitor blood pressure regularly at home. ICD-10 code: O09.90. (Patient not taking: Reported on 12/28/2020) 1 each 0    Capsaicin 0.033 % CREA Apply 1 Film topically 3 (three) times daily as needed. 56.6 g 0    clindamycin (CLEOCIN) 300 MG capsule Take 300 mg by mouth 3  (three) times daily.      [employer] HEMORRHOIDAL & ANALGESIC 1 % ointment SMARTSIG:1 Application Rectally PRN      dibucaine (NUPERCAINAL) 1 % OINT Place 1 application rectally as needed for hemorrhoids. 28 g 1    docusate sodium (COLACE) 100 MG capsule Take 1 capsule (100 mg total) by mouth 2 (two) times daily. 60 capsule 2    ferrous sulfate (FERROUSUL) 325 (65 FE) MG tablet Take 1 tablet (325 mg total) by mouth every other day. 30 tablet 3    fluticasone (FLOVENT HFA) 110 MCG/ACT inhaler Inhale 2 puffs into the lungs in the morning and at bedtime. 12 g 12    MAKENA autoinjector Inject into the skin. (Patient not taking: No sig reported)      pantoprazole (PROTONIX) 40 MG tablet Take 1 tablet (40 mg total) by mouth daily. 30 tablet 1    polyethylene glycol (MIRALAX) 17 g packet Take 17 g by mouth daily. Until bowel movement successful. 14 each 0    Prenatal 27-1 MG TABS Take 1 tablet by mouth daily. 30 tablet 12    promethazine (PHENERGAN) 25 MG suppository Place 1 suppository (25 mg total) rectally every 6 (six) hours as needed for nausea or vomiting. 12 each 2    promethazine (PHENERGAN) 25 MG tablet Take 1 tablet (25 mg total) by mouth every 6 (six) hours as needed for nausea or vomiting. 30 tablet 5    scopolamine (TRANSDERM-SCOP) 1 MG/3DAYS Place 1 patch (1.5 mg total) onto the skin every 3 (three) days. 10 patch 2    sucralfate (CARAFATE) 1 GM/10ML suspension Take 10 mLs (1 g total) by mouth 4 (four) times daily -  with meals and at bedtime. 420 mL 0     Review of Systems  Constitutional:  Negative for fever.  Eyes:  Negative for visual disturbance.  Respiratory:  Positive for shortness of breath (she associated with her asthma and progressing pregnancy) and wheezing.   Gastrointestinal:  Positive for diarrhea, nausea, rectal pain and vomiting. Negative for blood in stool and constipation.  Genitourinary:  Negative for difficulty urinating, dysuria, flank pain, frequency, urgency, vaginal  bleeding and vaginal discharge.       Lower abdominal/pelvic pressure   Musculoskeletal:  Negative for arthralgias and myalgias.  Physical Exam   Blood pressure 117/72, pulse 79, temperature 97.9 F (36.6 C), temperature source Oral, resp. rate 20, height 6\' 1"  (1.854 m), weight 119.3 kg, last menstrual period 04/08/2020, SpO2 99 %, not currently breastfeeding.  Physical Exam Constitutional:  Appearance: Normal appearance.  HENT:     Head: Normocephalic and atraumatic.  Eyes:     General: No scleral icterus. Cardiovascular:     Rate and Rhythm: Normal rate and regular rhythm.     Pulses: Normal pulses.     Heart sounds: Normal heart sounds. No murmur heard.   No friction rub. No gallop.  Pulmonary:     Effort: Pulmonary effort is normal. No respiratory distress.     Breath sounds: No stridor. Wheezing (upper lobes bilaterally) present. No rhonchi or rales.  Abdominal:     General: Bowel sounds are normal.     Tenderness: There is no abdominal tenderness. There is no guarding.  Musculoskeletal:     Right lower leg: Edema present.     Left lower leg: Edema present.  Skin:    General: Skin is warm and dry.  Neurological:     Mental Status: She is alert and oriented to person, place, and time.  Psychiatric:        Mood and Affect: Mood normal.        Behavior: Behavior normal.        Thought Content: Thought content normal.        Judgment: Judgment normal.   Results for orders placed or performed during the hospital encounter of 01/04/21 (from the past 24 hour(s))  Urinalysis, Routine w reflex microscopic Urine, Clean Catch     Status: Abnormal   Collection Time: 01/04/21 10:26 AM  Result Value Ref Range   Color, Urine YELLOW YELLOW   APPearance CLEAR CLEAR   Specific Gravity, Urine 1.015 1.005 - 1.030   pH 7.0 5.0 - 8.0   Glucose, UA NEGATIVE NEGATIVE mg/dL   Hgb urine dipstick NEGATIVE NEGATIVE   Bilirubin Urine NEGATIVE NEGATIVE   Ketones, ur NEGATIVE NEGATIVE  mg/dL   Protein, ur 30 (A) NEGATIVE mg/dL   Nitrite NEGATIVE NEGATIVE   Leukocytes,Ua NEGATIVE NEGATIVE   RBC / HPF 0-5 0 - 5 RBC/hpf   WBC, UA 0-5 0 - 5 WBC/hpf   Bacteria, UA NONE SEEN NONE SEEN   Squamous Epithelial / LPF 0-5 0 - 5   Mucus PRESENT   Wet prep, genital     Status: Abnormal   Collection Time: 01/04/21 11:28 AM   Specimen: PATH Cytology Cervicovaginal Ancillary Only  Result Value Ref Range   Yeast Wet Prep HPF POC NONE SEEN NONE SEEN   Trich, Wet Prep NONE SEEN NONE SEEN   Clue Cells Wet Prep HPF POC NONE SEEN NONE SEEN   WBC, Wet Prep HPF POC MANY (A) NONE SEEN   Sperm NONE SEEN     MAU Course  Procedures  MDM UA: WNL  Wet prep: WBC (many)  GC/Chlamydia: pending   Assessment and Plan  [redacted] weeks gestation of pregnancy  Abdominal/pelvic pressure: Assured patient the pelvic pressure is consistent with this stage of pregnancy. Wet prep NEGATIVE. Urinalysis NEGATIVE. She was given Tylenol and Flexeril for pain relief.  No ROM based off NEGATIVE fern slide and no evidence of fluid on pelvic exam.   Asthma: Encouraged patient to use her albuterol inhaler at home. She is asymptomatic without SOB or cough, however, wheezing was auscultated in the upper lobes bilaterally.  Hemorrhoids: Continue stool softening regimen as needed. Continue hemorrhoid cream for pain relief.  Athena Masse 01/04/2021, 11:31 AM      CNM attestation:  I have seen and examined this patient and agree with above documentation in the PA student's  note.   Leslie Yates is a 30 y.o. G2P0101 at [redacted]w[redacted]d reporting occasional loose stools, one episode of vaginal discharge, and pelvic pain. She also has multiple complaints of on and off again nausea and vomiting, hemorroids.  +FM,  denies VB, denies contractions, denies decreased fetal movement, endorses vaginal discharge clear x1 this morning while she was in the shower. Due to her diarrhea and discharge, she decided to come to MAU.   She also  reports that she has been using MJ occasionally at home to help with her nausea.   PE: Patient Vitals for the past 24 hrs:  BP Temp Temp src Pulse Resp SpO2 Height Weight  01/04/21 1250 (!) 117/54 -- -- (!) 53 -- -- -- --  01/04/21 1011 117/72 -- -- 79 -- -- -- --  01/04/21 0955 (!) 103/52 97.9 F (36.6 C) Oral 68 20 99 % -- --  01/04/21 0950 -- -- -- -- -- -- 6\' 1"  (1.854 m) 263 lb 1.6 oz (119.3 kg)   Gen: calm comfortable, NAD Resp: normal effort, no distress Heart: Regular rate Abd: Soft, NT, gravid, S=D Pelvic: NEFG, no discharge in the vagina, no blood, no pooling, cervix was checked by RN (see RN note) NST at 1244: 135 bpm, mod var, present acel, no decels, no contractions  ROS, labs, PMH reviewed  Orders Placed This Encounter  Procedures   Wet prep, genital   Urinalysis, Routine w reflex microscopic Urine, Clean Catch   Fern Test   Discharge patient Discharge disposition: 01-Home or Self Care; Discharge patient date: 01/04/2021   Meds ordered this encounter  Medications   acetaminophen (TYLENOL) tablet 650 mg   cyclobenzaprine (FLEXERIL) tablet 10 mg   cyclobenzaprine (FLEXERIL) 10 MG tablet    Sig: Take 1 tablet (10 mg total) by mouth 2 (two) times daily as needed for muscle spasms.    Dispense:  20 tablet    Refill:  0    Order Specific Question:   Supervising Provider    Answer:   01/06/2021 [[id number]]   acetaminophen (TYLENOL) 325 MG tablet    Sig: Take 2 tablets (650 mg total) by mouth every 4 (four) hours as needed.    Dispense:  100 tablet    Refill:  2    Order Specific Question:   Supervising Provider    Answer:   Reva Bores [[id number]]    MDM -no pooling on exam, negative fern slide. Patient had no more complaints of LOF while in MAU -wet prep negative  -patient had flexeril and tylenol while in MAU and slept; reports her pain is now a 0/10   Plan: - Discharge home in stable condition -Patient to keep her OB appt this afternoon at [facility] - Labor  precautions, signs of pre-e reviewed -discussed normalcy of the pelvic pain in pregnancy, reviewed when to come to MAU -encouraged patient to use her inhaler due to wheezes noted on exam today  - Follow-up as scheduled at your doctor's office for next prenatal visit or sooner as needed if symptoms worsen. [facility] CNM 01/04/2021 1:53 PM

## 2021-01-04 NOTE — Patient Instructions (Signed)
Vaginal Delivery ?Vaginal delivery means that you give birth by pushing your baby out of your birth canal (vagina). Your health care team will help you before, during, and after vaginal delivery. ?Birth experiences are unique for every woman and every pregnancy, and birth experiences vary depending on where you choose to give birth. ?What are the risks and benefits? ?Generally, this is safe. However, problems may occur, including: ?Bleeding. ?Infection. ?Damage to other structures such as vaginal tearing. ?Allergic reactions to medicines. ?Despite the risks, benefits of vaginal delivery include less risk of bleeding and infection and a shorter recovery time compared to a Cesarean delivery. Cesarean delivery, or C-section, is the surgical delivery of a baby. ?What happens when I arrive at the birth center or hospital? ?Once you are in labor and have been admitted into the hospital or birth center, your health care team may: ?Review your pregnancy history and any concerns that you have. ?Talk with you about your birth plan and discuss pain control options. ?Check your blood pressure, breathing, and heartbeat. ?Assess your baby's heartbeat. ?Monitor your uterus for contractions. ?Check whether your bag of water (amniotic sac) has broken (ruptured). ?Insert an IV into one of your veins. This may be used to give you fluids and medicines. ?Monitoring ?Your health care team may assess your contractions (uterine monitoring) and your baby's heart rate (fetal monitoring). You may need to be monitored: ?Often, but not continuously (intermittently). ?All the time or for long periods at a time (continuously). Continuous monitoring may be needed if: ?You are taking certain medicines, such as medicine to relieve pain or make your contractions stronger. ?You have pregnancy or labor complications. ?Monitoring may be done by: ?Placing a special stethoscope or a handheld monitoring device on your abdomen to check your baby's heartbeat  and to check for contractions. ?Placing monitors on your abdomen (external monitors) to record your baby's heartbeat and the frequency and length of contractions. ?Placing monitors inside your uterus through your vagina (internal monitors) to record your baby's heartbeat and the frequency, length, and strength of your contractions. Depending on the type of monitor, it may remain in your uterus or on your baby's head until birth. ?Telemetry. This is a type of continuous monitoring that can be done with external or internal monitors. Instead of having to stay in bed, you are able to move around. ?Physical exam ?Your health care team may perform frequent physical exams. This may include: ?Checking how and where your baby is positioned in your uterus. ?Checking your cervix to determine: ?Whether it is thinning out (effacing). ?Whether it is opening up (dilating). ?What happens during labor and delivery? ?Normal labor and delivery is divided into the following three stages: ?Stage 1 ?This is the longest stage of labor. ?Throughout this stage, you will feel contractions. Contractions generally feel mild, infrequent, and irregular at first. They get stronger, more frequent, and more regular as you move through this stage. You may have contractions about every 2-3 minutes. ?This stage ends when your cervix is completely dilated to 4 inches (10 cm) and completely effaced. ?Stage 2 ?This stage starts once your cervix is completely effaced and dilated and lasts until the delivery of your baby. ?This is the stage where you will feel an urge to push your baby out of your vagina. ?You may feel stretching and burning pain, especially when the widest part of your baby's head passes through the vaginal opening (crowning). ?Once your baby is delivered, the umbilical cord will be clamped and   cut. Timing of cutting the cord will depend on your wishes, your baby's health, and your health care provider's practices. ?Your baby will be  placed on your bare chest (skin-to-skin contact) in an upright position and covered with a warm blanket. If you are choosing to breastfeed, watch your baby for feeding cues, like rooting or sucking, and help the baby to your breast for his or her first feeding. ?Stage 3 ?This stage starts immediately after the birth of your baby and ends after you deliver the placenta. ?This stage may take anywhere from 5 to 30 minutes. ?After your baby has been delivered, you will feel contractions as your body expels the placenta. These contractions also help your uterus get smaller and reduce bleeding. ?What can I expect after labor and delivery? ?After labor is over, you and your baby will be assessed closely until you are ready to go home. Your health care team will teach you how to care for yourself and your baby. ?You and your baby may be encouraged to stay in the same room (rooming in) during your hospital stay. This will help promote early bonding and successful breastfeeding. ?Your uterus will be checked and massaged regularly (fundal massage). ?You may continue to receive fluids and medicines through an IV. ?You will have some soreness and pain in your abdomen, vagina, and the area of skin between your vaginal opening and your anus (perineum). ?If an incision was made near your vagina (episiotomy) or if you had some vaginal tearing during delivery, cold compresses may be placed on your episiotomy or your tear. This helps to reduce pain and swelling. ?It is normal to have vaginal bleeding after delivery. Wear a sanitary pad for vaginal bleeding and discharge. ?Summary ?Vaginal delivery means that you will give birth by pushing your baby out of your birth canal (vagina). ?Your health care team will monitor you and your baby throughout the stages of labor. ?After you deliver your baby, your health care team will continue to assess you and your baby to ensure you are both recovering as expected after delivery. ?This  information is not intended to replace advice given to you by your health care provider. Make sure you discuss any questions you have with your health care provider. ?Document Revised: 05/09/2020 Document Reviewed: 05/09/2020 ?Elsevier Patient Education ? 2022 [employer] ? ?

## 2021-01-04 NOTE — Progress Notes (Signed)
Subjective:  Leslie Yates is a 30 y.o. G2P0101 at [redacted]w[redacted]d being seen today for ongoing prenatal care.  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  Patient reports general discomforts of pregnancy. Seen in MAU this morning..  Contractions: Irritability. Vag. Bleeding: None.  Movement: Present. Denies leaking of fluid.   The following portions of the patient's history were reviewed and updated as appropriate: allergies, current medications, past family history, past medical history, past social history, past surgical history and problem list. Problem list updated.  Objective:   Vitals:   01/04/21 1500  BP: 115/67  Pulse: 71  Weight: 263 lb 8 oz (119.5 kg)    Fetal Status: Fetal Heart Rate (bpm): 157   Movement: Present     General:  Alert, oriented and cooperative. Patient is in no acute distress.  Skin: Skin is warm and dry. No rash noted.   Cardiovascular: Normal heart rate noted  Respiratory: Normal respiratory effort, no problems with respiration noted  Abdomen: Soft, gravid, appropriate for gestational age. Pain/Pressure: Present     Pelvic:  Cervical exam deferred        Extremities: Normal range of motion.  Edema: Trace  Mental Status: Normal mood and affect. Normal behavior. Normal judgment and thought content.   Urinalysis:      Assessment and Plan:  Pregnancy: G2P0101 at [redacted]w[redacted]d  1. Food insecurity  - AMBULATORY REFERRAL TO [facility]  2. Positive depression screening  - Ambulatory referral to  [facility]  3. Supervision of high risk pregnancy, antepartum Stable Labor precautions  4. 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 Please refer to After Visit Summary for other counseling recommendations.  Return in about 1 week (around 01/11/2021) for OB visit, face to face, MD only.   Hermina Staggers, MD

## 2021-01-04 NOTE — MAU Note (Signed)
Presents with c/o ctxs, thinks she may in labor.  Reports ctxs began @ approx 0800 this morning.  Also noticed some liquid coming out during shower, unsure if ROM.  Denies VB.  Endorses +FM.

## 2021-01-04 NOTE — MAU Note (Signed)
Pt also reporting nausea, vomiting, and loose stool for the past 3 days. Emesis x 1 this AM. Loose stool x 4-5 today. States she has not been able to take her medicine

## 2021-01-05 LAB — GC/CHLAMYDIA PROBE AMP (~~LOC~~) NOT AT ARMC
Chlamydia: NEGATIVE
Comment: NEGATIVE
Comment: NORMAL
Neisseria Gonorrhea: NEGATIVE

## 2021-01-11 ENCOUNTER — Other Ambulatory Visit: Payer: Self-pay

## 2021-01-11 ENCOUNTER — Ambulatory Visit (INDEPENDENT_AMBULATORY_CARE_PROVIDER_SITE_OTHER): Payer: Medicaid Other | Admitting: Obstetrics and Gynecology

## 2021-01-11 ENCOUNTER — Other Ambulatory Visit: Payer: Self-pay | Admitting: Advanced Practice Midwife

## 2021-01-11 ENCOUNTER — Encounter: Payer: Self-pay | Admitting: Obstetrics and Gynecology

## 2021-01-11 VITALS — BP 118/60 | HR 69 | Wt 262.0 lb

## 2021-01-11 DIAGNOSIS — O35BXX0 Maternal care for other (suspected) fetal abnormality and damage, fetal cardiac anomalies, not applicable or unspecified: Secondary | ICD-10-CM

## 2021-01-11 DIAGNOSIS — O358XX0 Maternal care for other (suspected) fetal abnormality and damage, not applicable or unspecified: Secondary | ICD-10-CM

## 2021-01-11 DIAGNOSIS — O0990 Supervision of high risk pregnancy, unspecified, unspecified trimester: Secondary | ICD-10-CM

## 2021-01-11 NOTE — Addendum Note (Signed)
Addended by: Hermina Staggers on: 01/11/2021 08:41 PM   Modules accepted: Orders, SmartSet

## 2021-01-11 NOTE — Progress Notes (Signed)
Subjective:  Leslie Yates is a 30 y.o. G2P0101 at [redacted]w[redacted]d being seen today for ongoing prenatal care.  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  Patient reports general discomforts of pregnancy.  Contractions: Irritability. Vag. Bleeding: None.  Movement: Present. Denies leaking of fluid.   The following portions of the patient's history were reviewed and updated as appropriate: allergies, current medications, past family history, past medical history, past social history, past surgical history and problem list. Problem list updated.  Objective:   Vitals:   01/11/21 1555  BP: 118/60  Pulse: 69  Weight: 262 lb (118.8 kg)    Fetal Status: Fetal Heart Rate (bpm): 147   Movement: Present     General:  Alert, oriented and cooperative. Patient is in no acute distress.  Skin: Skin is warm and dry. No rash noted.   Cardiovascular: Normal heart rate noted  Respiratory: Normal respiratory effort, no problems with respiration noted  Abdomen: Soft, gravid, appropriate for gestational age. Pain/Pressure: Present     Pelvic:  Cervical exam performed        Extremities: Normal range of motion.  Edema: Trace  Mental Status: Normal mood and affect. Normal behavior. Normal judgment and thought content.   Urinalysis:      Assessment and Plan:  Pregnancy: G2P0101 at [redacted]w[redacted]d  1. Supervision of high risk pregnancy, antepartum Labor precautions IOL scheduled for 39 weeks Pt to come to MAU on Sunday for foley placement  2. Anomaly of  heart of fetus affecting pregnancy, antepartum, single or unspecified fetus Minor VSD 45 % growth on 12/28/2020  Term labor symptoms and general obstetric precautions including but not limited to vaginal bleeding, contractions, leaking of fluid and fetal movement were reviewed in detail with the patient. Please refer to After Visit Summary for other counseling recommendations.  No follow-ups on file.   Hermina Staggers, MD

## 2021-01-11 NOTE — Patient Instructions (Signed)
Vaginal Delivery ?Vaginal delivery means that you give birth by pushing your baby out of your birth canal (vagina). Your health care team will help you before, during, and after vaginal delivery. ?Birth experiences are unique for every woman and every pregnancy, and birth experiences vary depending on where you choose to give birth. ?What are the risks and benefits? ?Generally, this is safe. However, problems may occur, including: ?Bleeding. ?Infection. ?Damage to other structures such as vaginal tearing. ?Allergic reactions to medicines. ?Despite the risks, benefits of vaginal delivery include less risk of bleeding and infection and a shorter recovery time compared to a Cesarean delivery. Cesarean delivery, or C-section, is the surgical delivery of a baby. ?What happens when I arrive at the birth center or hospital? ?Once you are in labor and have been admitted into the hospital or birth center, your health care team may: ?Review your pregnancy history and any concerns that you have. ?Talk with you about your birth plan and discuss pain control options. ?Check your blood pressure, breathing, and heartbeat. ?Assess your baby's heartbeat. ?Monitor your uterus for contractions. ?Check whether your bag of water (amniotic sac) has broken (ruptured). ?Insert an IV into one of your veins. This may be used to give you fluids and medicines. ?Monitoring ?Your health care team may assess your contractions (uterine monitoring) and your baby's heart rate (fetal monitoring). You may need to be monitored: ?Often, but not continuously (intermittently). ?All the time or for long periods at a time (continuously). Continuous monitoring may be needed if: ?You are taking certain medicines, such as medicine to relieve pain or make your contractions stronger. ?You have pregnancy or labor complications. ?Monitoring may be done by: ?Placing a special stethoscope or a handheld monitoring device on your abdomen to check your baby's heartbeat  and to check for contractions. ?Placing monitors on your abdomen (external monitors) to record your baby's heartbeat and the frequency and length of contractions. ?Placing monitors inside your uterus through your vagina (internal monitors) to record your baby's heartbeat and the frequency, length, and strength of your contractions. Depending on the type of monitor, it may remain in your uterus or on your baby's head until birth. ?Telemetry. This is a type of continuous monitoring that can be done with external or internal monitors. Instead of having to stay in bed, you are able to move around. ?Physical exam ?Your health care team may perform frequent physical exams. This may include: ?Checking how and where your baby is positioned in your uterus. ?Checking your cervix to determine: ?Whether it is thinning out (effacing). ?Whether it is opening up (dilating). ?What happens during labor and delivery? ?Normal labor and delivery is divided into the following three stages: ?Stage 1 ?This is the longest stage of labor. ?Throughout this stage, you will feel contractions. Contractions generally feel mild, infrequent, and irregular at first. They get stronger, more frequent, and more regular as you move through this stage. You may have contractions about every 2-3 minutes. ?This stage ends when your cervix is completely dilated to 4 inches (10 cm) and completely effaced. ?Stage 2 ?This stage starts once your cervix is completely effaced and dilated and lasts until the delivery of your baby. ?This is the stage where you will feel an urge to push your baby out of your vagina. ?You may feel stretching and burning pain, especially when the widest part of your baby's head passes through the vaginal opening (crowning). ?Once your baby is delivered, the umbilical cord will be clamped and   cut. Timing of cutting the cord will depend on your wishes, your baby's health, and your health care provider's practices. ?Your baby will be  placed on your bare chest (skin-to-skin contact) in an upright position and covered with a warm blanket. If you are choosing to breastfeed, watch your baby for feeding cues, like rooting or sucking, and help the baby to your breast for his or her first feeding. ?Stage 3 ?This stage starts immediately after the birth of your baby and ends after you deliver the placenta. ?This stage may take anywhere from 5 to 30 minutes. ?After your baby has been delivered, you will feel contractions as your body expels the placenta. These contractions also help your uterus get smaller and reduce bleeding. ?What can I expect after labor and delivery? ?After labor is over, you and your baby will be assessed closely until you are ready to go home. Your health care team will teach you how to care for yourself and your baby. ?You and your baby may be encouraged to stay in the same room (rooming in) during your hospital stay. This will help promote early bonding and successful breastfeeding. ?Your uterus will be checked and massaged regularly (fundal massage). ?You may continue to receive fluids and medicines through an IV. ?You will have some soreness and pain in your abdomen, vagina, and the area of skin between your vaginal opening and your anus (perineum). ?If an incision was made near your vagina (episiotomy) or if you had some vaginal tearing during delivery, cold compresses may be placed on your episiotomy or your tear. This helps to reduce pain and swelling. ?It is normal to have vaginal bleeding after delivery. Wear a sanitary pad for vaginal bleeding and discharge. ?Summary ?Vaginal delivery means that you will give birth by pushing your baby out of your birth canal (vagina). ?Your health care team will monitor you and your baby throughout the stages of labor. ?After you deliver your baby, your health care team will continue to assess you and your baby to ensure you are both recovering as expected after delivery. ?This  information is not intended to replace advice given to you by your health care provider. Make sure you discuss any questions you have with your health care provider. ?Document Revised: 05/09/2020 Document Reviewed: 05/09/2020 ?Elsevier Patient Education ? 2022 [employer] ? ?

## 2021-01-13 ENCOUNTER — Telehealth (HOSPITAL_COMMUNITY): Payer: Self-pay | Admitting: *Deleted

## 2021-01-13 ENCOUNTER — Other Ambulatory Visit (HOSPITAL_COMMUNITY)
Admission: RE | Admit: 2021-01-13 | Discharge: 2021-01-13 | Disposition: A | Discharge status: Home / Self Care | Payer: Medicaid Other | Source: Ambulatory Visit | Attending: Obstetrics and Gynecology | Admitting: Obstetrics and Gynecology

## 2021-01-13 DIAGNOSIS — Z20822 Contact with and (suspected) exposure to covid-19: Secondary | ICD-10-CM | POA: Insufficient documentation

## 2021-01-13 DIAGNOSIS — Z01812 Encounter for preprocedural laboratory examination: Secondary | ICD-10-CM | POA: Insufficient documentation

## 2021-01-13 LAB — SARS CORONAVIRUS 2 (TAT 6-24 HRS): SARS Coronavirus 2: NEGATIVE

## 2021-01-13 NOTE — Telephone Encounter (Signed)
Preadmission screen  

## 2021-01-15 ENCOUNTER — Encounter (HOSPITAL_COMMUNITY): Payer: Self-pay | Admitting: Obstetrics and Gynecology

## 2021-01-15 ENCOUNTER — Inpatient Hospital Stay (HOSPITAL_COMMUNITY)
Admission: AD | Admit: 2021-01-15 | Discharge: 2021-01-15 | Disposition: A | Discharge status: Home / Self Care | Payer: Medicaid Other | Attending: Obstetrics and Gynecology | Admitting: Obstetrics and Gynecology

## 2021-01-15 ENCOUNTER — Inpatient Hospital Stay (HOSPITAL_COMMUNITY)
Admission: AD | Admit: 2021-01-15 | Payer: Medicaid Other | Source: Home / Self Care | Admitting: Obstetrics and Gynecology

## 2021-01-15 DIAGNOSIS — Z8759 Personal history of other complications of pregnancy, childbirth and the puerperium: Secondary | ICD-10-CM

## 2021-01-15 DIAGNOSIS — O09292 Supervision of pregnancy with other poor reproductive or obstetric history, second trimester: Secondary | ICD-10-CM

## 2021-01-15 DIAGNOSIS — Z3A39 39 weeks gestation of pregnancy: Secondary | ICD-10-CM | POA: Diagnosis not present

## 2021-01-15 DIAGNOSIS — O09213 Supervision of pregnancy with history of pre-term labor, third trimester: Secondary | ICD-10-CM | POA: Diagnosis not present

## 2021-01-15 DIAGNOSIS — O09892 Supervision of other high risk pregnancies, second trimester: Secondary | ICD-10-CM

## 2021-01-15 DIAGNOSIS — O0990 Supervision of high risk pregnancy, unspecified, unspecified trimester: Secondary | ICD-10-CM

## 2021-01-15 DIAGNOSIS — Z3689 Encounter for other specified antenatal screening: Secondary | ICD-10-CM

## 2021-01-15 NOTE — MAU Note (Signed)
Pt for induction tomorrow.  Here for foley bulb placement for cervical ripening.  States has been having pain in abd all morning. No bleeding or leaking.  +FM.  Was throwing up this morning

## 2021-01-15 NOTE — MAU Provider Note (Signed)
HPI Leslie Yates is a 30 y.o. female  G2P0101 @39 .1 wks here for outpatient foley placement for cervical ripening. Pt reports no contractions. She denies VB or LOF. She reports + fetal movement. All other systems negative.    Patient's last menstrual period was 04/08/2020 (exact date).  OB History  Gravida Para Term Preterm AB Living  2 1 0 1 0 1  SAB IAB Ectopic Multiple Live Births  0 0 0 0 1    # Outcome Date GA Lbr Len/2nd Weight Sex Delivery Anes PTL Lv  2 Current           1 Preterm 11/21/19 [redacted]w[redacted]d 08:31 / 00:07 2296 g F Vag-Spont EPI  LIV    Past Medical History:  Diagnosis Date   Asthma    Cyclical vomiting with nausea 10/27/2018   ED visits for vomiting and diarrhea since 2009-13 At least 4-5 references witnessing patient inducing vomiting by sticking fingers down throat Has had difficulty obtaining meds through the years GI consult made but never went   Elevated liver enzymes 11/19/2019   Gestational hypertension 11/17/2019   IOL @ 37 weeks   Lump of breast 06/12/2019   Obesity    Ovarian cyst    Sickle cell trait (HCC)     Family History  Problem Relation Age of Onset   Diabetes Mother    Hypertension Mother    Diabetes Father    Hypertension Father     Social History   Socioeconomic History   Marital status: Single    Spouse name: Not on file   Number of children: 1   Years of education: Not on file   Highest education level: Some college, no degree  Occupational History   Not on file  Tobacco Use   Smoking status: Every Day    Packs/day: 0.25    Years: 11.00    Pack years: 2.75    Types: Cigarettes   Smokeless tobacco: Never  Vaping Use   Vaping Use: Never used  Substance and Sexual Activity   Alcohol use: Not Currently    Comment: occ   Drug use: Yes    Types: Marijuana    Comment: last used 12/31/2020   Sexual activity: Yes    Birth control/protection: None  Other Topics Concern   Not on file  Social History Narrative   ** Merged History  Encounter **       ** Merged History Encounter ** Patient is having trouble with financial issues.   Social Determinants of 03/03/2021 Strain: Not on file  Food Insecurity: Food Insecurity Present   Worried About Corporate investment banker in the Last Year: Sometimes true   Programme researcher, broadcasting/film/video in the Last Year: Sometimes true  Transportation Needs: Unmet Transportation Needs   Lack of Transportation (Medical): Yes   Lack of Transportation (Non-Medical): Yes  Physical Activity: Not on file  Stress: Not on file  Social Connections: Not on file    Allergies  Allergen Reactions   Penicillins Rash    Childhood rash, Tolerated cefazolin (2021) Has patient had a PCN reaction causing immediate rash, facial/tongue/throat swelling, SOB or lightheadedness with hypotension: no Has patient had a PCN reaction causing severe rash involving mucus membranes or skin necrosis:no Has patient had a PCN reaction that required hospitalization no Has patient had a PCN reaction occurring within the last 10 years: no If all of the above answers are "NO", then may proceed with Cephalosporin use.  No current facility-administered medications on file prior to encounter.   Current Outpatient Medications on File Prior to Encounter  Medication Sig Dispense Refill   acetaminophen (TYLENOL) 325 MG tablet Take 2 tablets (650 mg total) by mouth every 4 (four) hours as needed. 100 tablet 2   aspirin EC 81 MG tablet Take 1 tablet (81 mg total) by mouth daily. 60 tablet 2   clindamycin (CLEOCIN) 300 MG capsule Take 300 mg by mouth 3 (three) times daily.     [facility] HEMORRHOIDAL & ANALGESIC 1 % ointment SMARTSIG:1 Application Rectally PRN     cyclobenzaprine (FLEXERIL) 10 MG tablet Take 1 tablet (10 mg total) by mouth 2 (two) times daily as needed for muscle spasms. 20 tablet 0   dibucaine (NUPERCAINAL) 1 % OINT Place 1 application rectally as needed for hemorrhoids. 28 g 1   docusate sodium (COLACE) 100 MG  capsule Take 1 capsule (100 mg total) by mouth 2 (two) times daily. 60 capsule 2   ferrous sulfate (FERROUSUL) 325 (65 FE) MG tablet Take 1 tablet (325 mg total) by mouth every other day. 30 tablet 3   pantoprazole (PROTONIX) 40 MG tablet Take 1 tablet (40 mg total) by mouth daily. 30 tablet 1   polyethylene glycol (MIRALAX) 17 g packet Take 17 g by mouth daily. Until bowel movement successful. 14 each 0   Prenatal 27-1 MG TABS Take 1 tablet by mouth daily. 30 tablet 12   promethazine (PHENERGAN) 25 MG suppository Place 1 suppository (25 mg total) rectally every 6 (six) hours as needed for nausea or vomiting. 12 each 2   promethazine (PHENERGAN) 25 MG tablet Take 1 tablet (25 mg total) by mouth every 6 (six) hours as needed for nausea or vomiting. 30 tablet 5   scopolamine (TRANSDERM-SCOP) 1 MG/3DAYS Place 1 patch (1.5 mg total) onto the skin every 3 (three) days. 10 patch 2   sucralfate (CARAFATE) 1 GM/10ML suspension Take 10 mLs (1 g total) by mouth 4 (four) times daily -  with meals and at bedtime. 420 mL 0   albuterol (VENTOLIN HFA) 108 (90 Base) MCG/ACT inhaler Inhale 2 puffs into the lungs every 6 (six) hours as needed for wheezing or shortness of breath. 18 g 1   Blood Pressure Monitoring (BLOOD PRESSURE MONITOR AUTOMAT) DEVI 1 Device by Does not apply route daily. Automatic blood pressure cuff regular/x-large size cuff. To monitor blood pressure regularly at home. ICD-10 code: O09.90. 1 each 0   Capsaicin 0.033 % CREA Apply 1 Film topically 3 (three) times daily as needed. 56.6 g 0   fluticasone (FLOVENT HFA) 110 MCG/ACT inhaler Inhale 2 puffs into the lungs in the morning and at bedtime. 12 g 12    Review of Systems  Gastrointestinal: Negative.   Genitourinary: Negative.    Physical Exam   Vitals:   01/15/21 1117 01/15/21 1133  BP: (!) 124/55 112/61  Pulse: 63 64  Resp: 18   Temp: 98.2 F (36.8 C)   TempSrc: Oral   SpO2: 99%   Weight: 107 kg   Height: 6\' 1"  (1.854 m)      Physical Exam Vitals and nursing note reviewed. Exam conducted with a chaperone present.  Constitutional:      General: She is not in acute distress.    Appearance: Normal appearance.  HENT:     Head: Normocephalic and atraumatic.  Pulmonary:     Effort: Pulmonary effort is normal. No respiratory distress.  Musculoskeletal:     Cervical  back: Normal range of motion.  Skin:    General: Skin is warm and dry.  Neurological:     General: No focal deficit present.     Mental Status: She is alert and oriented to person, place, and time.  Psychiatric:        Mood and Affect: Mood normal.        Behavior: Behavior normal.   MAU Course   Procedure: Patient informed of R/B/A of procedure. NST was performed and was reactive prior to procedure. Procedure done to begin ripening of the cervix prior to admission for induction of labor. Appropriate time out taken. The patient was placed in the lithotomy position and a cervical exam was performed and a finger was used to guide the 4F foley balloon through the internal os of the cervix. Foley Balloon filled with 60cc of sterile water. Plug inserted into end of the foley. Foley placed on tension and taped to medical thigh. NST:  Baseline: 145 bpm, Variability: Good {> 6 bpm), Accelerations: Reactive, and Decelerations: Absent there were no signs of tachysystole or hypertonus. All equipment was removed and accounted for. The patient tolerated the procedure well.   Assessment and Plan:   1. Hx of HELLP syndrome, currently pregnant, second trimester   2. Supervision of high risk pregnancy, antepartum   3. Hx of preterm delivery, currently pregnant, second trimester     S/p Outpatient placement of foley balloon catheter for cervical ripening. Induction of labor scheduled for tomorrow at 0925 am. Reassuring FHR tracing with no concerns at present. Warning signs given to patient to include return to MAU for heavy vaginal bleeding, rupture of  membranes, painful uterine contractions q 5 mins or less, severe abdominal discomfort, decreased fetal movement.  Discharge home Return tomorrow for IOL Labor precautions  [name], CNM 01/15/2021 12:06 PM

## 2021-01-16 ENCOUNTER — Inpatient Hospital Stay (HOSPITAL_COMMUNITY): Payer: Medicaid Other | Admitting: Anesthesiology

## 2021-01-16 ENCOUNTER — Inpatient Hospital Stay (HOSPITAL_COMMUNITY)
Admission: AD | Admit: 2021-01-16 | Discharge: 2021-01-19 | DRG: 806 | Disposition: A | Discharge status: Home / Self Care | Payer: Medicaid Other | Attending: Family Medicine | Admitting: Family Medicine

## 2021-01-16 ENCOUNTER — Inpatient Hospital Stay (HOSPITAL_COMMUNITY): Payer: Medicaid Other

## 2021-01-16 ENCOUNTER — Encounter (HOSPITAL_COMMUNITY): Payer: Self-pay | Admitting: Obstetrics & Gynecology

## 2021-01-16 DIAGNOSIS — F17210 Nicotine dependence, cigarettes, uncomplicated: Secondary | ICD-10-CM | POA: Diagnosis not present

## 2021-01-16 DIAGNOSIS — O9902 Anemia complicating childbirth: Secondary | ICD-10-CM | POA: Diagnosis present

## 2021-01-16 DIAGNOSIS — Z7982 Long term (current) use of aspirin: Secondary | ICD-10-CM

## 2021-01-16 DIAGNOSIS — J45909 Unspecified asthma, uncomplicated: Secondary | ICD-10-CM | POA: Diagnosis present

## 2021-01-16 DIAGNOSIS — O9952 Diseases of the respiratory system complicating childbirth: Secondary | ICD-10-CM | POA: Diagnosis present

## 2021-01-16 DIAGNOSIS — D573 Sickle-cell trait: Secondary | ICD-10-CM | POA: Diagnosis present

## 2021-01-16 DIAGNOSIS — O99892 Other specified diseases and conditions complicating childbirth: Secondary | ICD-10-CM | POA: Diagnosis not present

## 2021-01-16 DIAGNOSIS — F1290 Cannabis use, unspecified, uncomplicated: Secondary | ICD-10-CM | POA: Diagnosis present

## 2021-01-16 DIAGNOSIS — O99354 Diseases of the nervous system complicating childbirth: Secondary | ICD-10-CM | POA: Diagnosis present

## 2021-01-16 DIAGNOSIS — Z331 Pregnant state, incidental: Secondary | ICD-10-CM

## 2021-01-16 DIAGNOSIS — O99214 Obesity complicating childbirth: Secondary | ICD-10-CM | POA: Diagnosis not present

## 2021-01-16 DIAGNOSIS — F445 Conversion disorder with seizures or convulsions: Secondary | ICD-10-CM | POA: Diagnosis not present

## 2021-01-16 DIAGNOSIS — R001 Bradycardia, unspecified: Secondary | ICD-10-CM | POA: Diagnosis not present

## 2021-01-16 DIAGNOSIS — G40909 Epilepsy, unspecified, not intractable, without status epilepticus: Secondary | ICD-10-CM | POA: Diagnosis not present

## 2021-01-16 DIAGNOSIS — O26893 Other specified pregnancy related conditions, third trimester: Secondary | ICD-10-CM | POA: Diagnosis present

## 2021-01-16 DIAGNOSIS — O358XX0 Maternal care for other (suspected) fetal abnormality and damage, not applicable or unspecified: Principal | ICD-10-CM | POA: Diagnosis present

## 2021-01-16 DIAGNOSIS — O164 Unspecified maternal hypertension, complicating childbirth: Secondary | ICD-10-CM | POA: Diagnosis not present

## 2021-01-16 DIAGNOSIS — O99334 Smoking (tobacco) complicating childbirth: Secondary | ICD-10-CM | POA: Diagnosis present

## 2021-01-16 DIAGNOSIS — Z3A39 39 weeks gestation of pregnancy: Secondary | ICD-10-CM | POA: Diagnosis not present

## 2021-01-16 DIAGNOSIS — Z880 Allergy status to penicillin: Secondary | ICD-10-CM | POA: Diagnosis not present

## 2021-01-16 DIAGNOSIS — O09292 Supervision of pregnancy with other poor reproductive or obstetric history, second trimester: Secondary | ICD-10-CM

## 2021-01-16 DIAGNOSIS — R569 Unspecified convulsions: Secondary | ICD-10-CM | POA: Diagnosis not present

## 2021-01-16 DIAGNOSIS — O99324 Drug use complicating childbirth: Secondary | ICD-10-CM | POA: Diagnosis present

## 2021-01-16 LAB — CBC
HCT: 30.7 % — ABNORMAL LOW (ref 36.0–46.0)
Hemoglobin: 10.8 g/dL — ABNORMAL LOW (ref 12.0–15.0)
MCH: 32.8 pg (ref 26.0–34.0)
MCHC: 35.2 g/dL (ref 30.0–36.0)
MCV: 93.3 fL (ref 80.0–100.0)
Platelets: 198 K/uL (ref 150–400)
RBC: 3.29 MIL/uL — ABNORMAL LOW (ref 3.87–5.11)
RDW: 13 % (ref 11.5–15.5)
WBC: 9 K/uL (ref 4.0–10.5)
nRBC: 0 % (ref 0.0–0.2)

## 2021-01-16 LAB — COMPREHENSIVE METABOLIC PANEL WITH GFR
ALT: 11 U/L (ref 0–44)
AST: 16 U/L (ref 15–41)
Albumin: 2.9 g/dL — ABNORMAL LOW (ref 3.5–5.0)
Alkaline Phosphatase: 69 U/L (ref 38–126)
Anion gap: 6 (ref 5–15)
BUN: 5 mg/dL — ABNORMAL LOW (ref 6–20)
CO2: 23 mmol/L (ref 22–32)
Calcium: 8.6 mg/dL — ABNORMAL LOW (ref 8.9–10.3)
Chloride: 107 mmol/L (ref 98–111)
Creatinine, Ser: 0.63 mg/dL (ref 0.44–1.00)
GFR, Estimated: >60 mL/min
Glucose, Bld: 89 mg/dL (ref 70–99)
Potassium: 3.4 mmol/L — ABNORMAL LOW (ref 3.5–5.1)
Sodium: 136 mmol/L (ref 135–145)
Total Bilirubin: 0.7 mg/dL (ref 0.3–1.2)
Total Protein: 5.6 g/dL — ABNORMAL LOW (ref 6.5–8.1)

## 2021-01-16 LAB — PROTEIN / CREATININE RATIO, URINE
Creatinine, Urine: 122.39 mg/dL
Protein Creatinine Ratio: 0.11 mg/mg{Cre} (ref 0.00–0.15)
Total Protein, Urine: 13 mg/dL

## 2021-01-16 LAB — HEPATITIS C ANTIBODY: HCV Ab: NONREACTIVE

## 2021-01-16 LAB — SYPHILIS: RPR W/REFLEX TO RPR TITER AND TREPONEMAL ANTIBODIES, TRADITIONAL SCREENING AND DIAGNOSIS ALGORITHM: RPR Ser Ql: NONREACTIVE

## 2021-01-16 LAB — HEPATITIS B SURFACE ANTIGEN: Hepatitis B Surface Ag: NONREACTIVE

## 2021-01-16 MED ORDER — PHENYLEPHRINE 40 MCG/ML (10ML) SYRINGE FOR IV PUSH (FOR BLOOD PRESSURE SUPPORT): 80.0000 ug | PREFILLED_SYRINGE | INTRAVENOUS | Status: DC | PRN

## 2021-01-16 MED ORDER — MEASLES, MUMPS & RUBELLA VAC IJ SOLR: 0.5000 mL | Freq: Once | INTRAMUSCULAR | Status: DC

## 2021-01-16 MED ORDER — LACTATED RINGERS IV SOLN: 500.0000 mL | Freq: Once | INTRAVENOUS | Status: DC

## 2021-01-16 MED ORDER — OXYTOCIN-SODIUM CHLORIDE 30-0.9 UT/500ML-% IV SOLN: 2.5000 [IU]/h | INTRAVENOUS | Status: DC

## 2021-01-16 MED ORDER — OXYTOCIN BOLUS FROM INFUSION
333.0000 mL | Freq: Once | INTRAVENOUS | Status: AC
Start: 2021-01-16 — End: 2021-01-16
  Administered 2021-01-16: 333 mL via INTRAVENOUS

## 2021-01-16 MED ORDER — FENTANYL CITRATE (PF) 100 MCG/2ML IJ SOLN
50.0000 ug | INTRAMUSCULAR | Status: DC | PRN
  Administered 2021-01-16: 100 ug via INTRAVENOUS
  Filled 2021-01-16: qty 2

## 2021-01-16 MED ORDER — LACTATED RINGERS IV SOLN: 500.0000 mL | INTRAVENOUS | Status: DC | PRN

## 2021-01-16 MED ORDER — OXYTOCIN-SODIUM CHLORIDE 30-0.9 UT/500ML-% IV SOLN
1.0000 m[IU]/min | INTRAVENOUS | Status: DC
  Administered 2021-01-16: 2 m[IU]/min via INTRAVENOUS
  Filled 2021-01-16: qty 500

## 2021-01-16 MED ORDER — OXYCODONE-ACETAMINOPHEN 5-325 MG PO TABS: 2.0000 | ORAL_TABLET | ORAL | Status: DC | PRN

## 2021-01-16 MED ORDER — SENNOSIDES-DOCUSATE SODIUM 8.6-50 MG PO TABS
2.0000 | ORAL_TABLET | Freq: Every day | ORAL | Status: DC
  Administered 2021-01-17 – 2021-01-19 (×2): 2 via ORAL
  Filled 2021-01-16 (×2): qty 2

## 2021-01-16 MED ORDER — SIMETHICONE 80 MG PO CHEW: 80.0000 mg | CHEWABLE_TABLET | ORAL | Status: DC | PRN

## 2021-01-16 MED ORDER — ZOLPIDEM TARTRATE 5 MG PO TABS: 5.0000 mg | ORAL_TABLET | Freq: Every evening | ORAL | Status: DC | PRN

## 2021-01-16 MED ORDER — PRENATAL MULTIVITAMIN CH
1.0000 | ORAL_TABLET | Freq: Every day | ORAL | Status: DC
Start: 2021-01-17 — End: 2021-01-19

## 2021-01-16 MED ORDER — DIBUCAINE (PERIANAL) 1 % EX OINT: 1.0000 "application " | TOPICAL_OINTMENT | CUTANEOUS | Status: DC | PRN

## 2021-01-16 MED ORDER — DIPHENHYDRAMINE HCL 50 MG/ML IJ SOLN
12.5000 mg | INTRAMUSCULAR | Status: DC | PRN
  Administered 2021-01-16: 12.5 mg via INTRAVENOUS
  Filled 2021-01-16 (×2): qty 1

## 2021-01-16 MED ORDER — EPHEDRINE 5 MG/ML INJ: 10.0000 mg | INTRAVENOUS | Status: DC | PRN

## 2021-01-16 MED ORDER — ONDANSETRON HCL 4 MG PO TABS: 4.0000 mg | ORAL_TABLET | ORAL | Status: DC | PRN

## 2021-01-16 MED ORDER — COCONUT OIL OIL: 1.0000 "application " | TOPICAL_OIL | Status: DC | PRN

## 2021-01-16 MED ORDER — DIPHENHYDRAMINE HCL 25 MG PO CAPS
25.0000 mg | ORAL_CAPSULE | Freq: Four times a day (QID) | ORAL | Status: DC | PRN
  Administered 2021-01-19: 25 mg via ORAL
  Filled 2021-01-16: qty 1

## 2021-01-16 MED ORDER — WITCH HAZEL-GLYCERIN EX PADS: 1.0000 "application " | MEDICATED_PAD | CUTANEOUS | Status: DC | PRN

## 2021-01-16 MED ORDER — ACETAMINOPHEN 325 MG PO TABS: 650.0000 mg | ORAL_TABLET | ORAL | Status: DC | PRN

## 2021-01-16 MED ORDER — SOD CITRATE-CITRIC ACID 500-334 MG/5ML PO SOLN
30.0000 mL | ORAL | Status: DC | PRN
  Administered 2021-01-16: 30 mL via ORAL
  Filled 2021-01-16: qty 30

## 2021-01-16 MED ORDER — LACTATED RINGERS IV SOLN: INTRAVENOUS | Status: DC

## 2021-01-16 MED ORDER — ACETAMINOPHEN 325 MG PO TABS
650.0000 mg | ORAL_TABLET | ORAL | Status: DC | PRN
  Administered 2021-01-19: 650 mg via ORAL
  Filled 2021-01-16: qty 2

## 2021-01-16 MED ORDER — TETANUS-DIPHTH-ACELL PERTUSSIS 5-2.5-18.5 LF-MCG/0.5 IM SUSY
0.5000 mL | PREFILLED_SYRINGE | Freq: Once | INTRAMUSCULAR | Status: DC
Start: 2021-01-17 — End: 2021-01-19

## 2021-01-16 MED ORDER — ONDANSETRON HCL 4 MG/2ML IJ SOLN
4.0000 mg | INTRAMUSCULAR | Status: DC | PRN
  Administered 2021-01-17 (×3): 4 mg via INTRAVENOUS
  Filled 2021-01-16 (×3): qty 2

## 2021-01-16 MED ORDER — LIDOCAINE HCL (PF) 1 % IJ SOLN: 30.0000 mL | INTRAMUSCULAR | Status: DC | PRN

## 2021-01-16 MED ORDER — BENZOCAINE-MENTHOL 20-0.5 % EX AERO: 1.0000 "application " | INHALATION_SPRAY | CUTANEOUS | Status: DC | PRN

## 2021-01-16 MED ORDER — EPHEDRINE 5 MG/ML INJ
10.0000 mg | INTRAVENOUS | Status: DC | PRN
Start: 2021-01-16 — End: 2021-01-16

## 2021-01-16 MED ORDER — FENTANYL-BUPIVACAINE-NACL 0.5-0.125-0.9 MG/250ML-% EP SOLN
12.0000 mL/h | EPIDURAL | Status: DC | PRN
Start: 2021-01-16 — End: 2021-01-16
  Administered 2021-01-16: 12 mL/h via EPIDURAL
  Filled 2021-01-16: qty 250

## 2021-01-16 MED ORDER — TERBUTALINE SULFATE 1 MG/ML IJ SOLN: 0.2500 mg | Freq: Once | INTRAMUSCULAR | Status: DC | PRN

## 2021-01-16 MED ORDER — OXYCODONE-ACETAMINOPHEN 5-325 MG PO TABS: 1.0000 | ORAL_TABLET | ORAL | Status: DC | PRN

## 2021-01-16 MED ORDER — LIDOCAINE HCL (PF) 1 % IJ SOLN
INTRAMUSCULAR | Status: DC | PRN
  Administered 2021-01-16 (×2): 4 mL via EPIDURAL

## 2021-01-16 MED ORDER — IBUPROFEN 600 MG PO TABS
600.0000 mg | ORAL_TABLET | Freq: Four times a day (QID) | ORAL | Status: DC
Start: 2021-01-17 — End: 2021-01-19
  Administered 2021-01-17 – 2021-01-19 (×5): 600 mg via ORAL
  Filled 2021-01-16 (×5): qty 1

## 2021-01-16 MED ORDER — ONDANSETRON HCL 4 MG/2ML IJ SOLN
4.0000 mg | Freq: Four times a day (QID) | INTRAMUSCULAR | Status: DC | PRN
  Administered 2021-01-16: 4 mg via INTRAVENOUS
  Filled 2021-01-16: qty 2

## 2021-01-16 NOTE — Anesthesia Preprocedure Evaluation (Signed)
Anesthesia Evaluation  Patient identified by MRN, date of birth, ID band Patient awake    Reviewed: Allergy & Precautions, Patient's Chart, lab work & pertinent test results  History of Anesthesia Complications Negative for: history of anesthetic complications  Airway Mallampati: II  TM Distance: >3 FB Neck ROM: Full    Dental no notable dental hx.    Pulmonary asthma , Current Smoker,    Pulmonary exam normal        Cardiovascular hypertension, Normal cardiovascular exam     Neuro/Psych    GI/Hepatic Neg liver ROS, GERD  Medicated,  Endo/Other  negative endocrine ROS  Renal/GU negative Renal ROS  negative genitourinary   Musculoskeletal negative musculoskeletal ROS (+)   Abdominal   Peds  Hematology negative hematology ROS (+)   Anesthesia Other Findings Day of surgery medications reviewed with patient.  Reproductive/Obstetrics (+) Pregnancy (Hx of HELLP/eclampsia in prior pregnancy)                             Anesthesia Physical Anesthesia Plan  ASA: 2  Anesthesia Plan: Epidural   Post-op Pain Management:    Induction:   PONV Risk Score and Plan: Treatment may vary due to age or medical condition  Airway Management Planned: Natural Airway  Additional Equipment:   Intra-op Plan:   Post-operative Plan:   Informed Consent: I have reviewed the patients History and Physical, chart, labs and discussed the procedure including the risks, benefits and alternatives for the proposed anesthesia with the patient or authorized representative who has indicated his/her understanding and acceptance.       Plan Discussed with:   Anesthesia Plan Comments:         Anesthesia Quick Evaluation

## 2021-01-16 NOTE — Progress Notes (Addendum)
Labor Progress Note Leslie Yates is a 30 y.o. G2P0101 at [redacted]w[redacted]d presented for IOL.  S: Patient is resting comfortably with support system at bedside. Epidural has provided significant pain relief.   O:  BP 111/68   Pulse (!) 51   LMP 04/08/2020 (Exact Date)  EFM: baseline 130 BPM/ mod variability/+accels/-decels  Toco: contractions q2-21min  CVE: Dilation: 5 Effacement (%): 70 Cervical Position: Posterior Station: -3 Presentation: Vertex Exam by:: maxwell   A&P: 30 y.o. [id number] [redacted]w[redacted]d presenting for IOL.  #Labor: Latent. On pit-started@0847 . Titrated to 38ml/hr-continue titrating as tolerated to progress labor.  #Pain: epidural #FWB: cat 1 #GBS negative #Tobacco/marijuana use: SW consult PP #Hx HELLP: pending PCR, potassium: 3.4, calcium: 8.2  Alfredo Martinez, MD [facility] [employer], [facility] 3:12 PM  Midwife attestation I agree with the documentation in the resident's note.   [name], CNM 3:24 PM

## 2021-01-16 NOTE — Anesthesia Procedure Notes (Addendum)
Epidural Patient location during procedure: OB Start time: 01/16/2021 1:43 PM End time: 01/16/2021 1:56 PM  Staffing Anesthesiologist: Kaylyn Layer, MD Performed: anesthesiologist   Preanesthetic Checklist Completed: patient identified, IV checked, risks and benefits discussed, monitors and equipment checked, pre-op evaluation and timeout performed  Epidural Patient position: sitting Prep: DuraPrep Patient monitoring: heart rate, continuous pulse ox and blood pressure Approach: midline Location: L3-L4 Injection technique: LOR air  Needle:  Needle type: Tuohy  Needle gauge: 17 G Needle length: 9 cm Needle insertion depth: 6 cm Catheter type: closed end flexible Catheter size: 19 Gauge Catheter at skin depth: 11 cm Test dose: negative  Assessment Events: blood not aspirated, injection not painful, no injection resistance, no paresthesia and negative IV test  Additional Notes Reason for block:procedure for pain

## 2021-01-16 NOTE — H&P (Addendum)
OBSTETRIC ADMISSION HISTORY AND PHYSICAL  Leslie Yates is a 30 y.o. female 582P0101 with IUP at 7524w2d by 6w US presenting for IOL. She reports +FMs, No LOF, no VB, no blurry vision, headaches or peripheral edema, and RUQ pain.  She plans on breastfeeding. She requests Nexplanon for birth control. She wants an epidural.  [facility]   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  - Hx of gestational HTN  - Hx of HELLP syndrome  - Fetal perimembranous VSD  - Seizure disorder   Past Medical History: Past Medical History:  Diagnosis Date   Asthma    Cyclical vomiting with nausea 10/27/2018   ED visits for vomiting and diarrhea since 2009-13 At least 4-5 references witnessing patient inducing vomiting by sticking fingers down throat Has had difficulty obtaining meds through the years GI consult made but never went   Elevated liver enzymes 11/19/2019   Gestational hypertension 11/17/2019   IOL @ 37 weeks   Lump of breast 06/12/2019   Obesity    Ovarian cyst    Sickle cell trait (HCC)     Past Surgical History: Past Surgical History:  Procedure Laterality Date   INCISION AND DRAINAGE ABSCESS      Obstetrical History: OB History     Gravida  2   Para  1   Term  0   Preterm  1   AB  0   Living  1      SAB  0   IAB  0   Ectopic  0   Multiple  0   Live Births  1           Social History: Social History   Socioeconomic History   Marital status: Single    Spouse name: Not on file   Number of children: 1   Years of education: Not on file   Highest education level: Some college, no degree  Occupational History   Not on file  Tobacco Use   Smoking status: Every Day    Packs/day: 0.25    Years: 11.00    Pack years: 2.75     Types: Cigarettes   Smokeless tobacco: Never  Vaping Use   Vaping Use: Never used  Substance and Sexual Activity   Alcohol use: Not Currently    Comment: occ   Drug use: Yes    Types: Marijuana    Comment: last used 12/31/2020   Sexual activity: Yes    Birth control/protection: None  Other Topics Concern   Not on file  Social History Narrative   ** Merged History Encounter **       ** Merged History Encounter ** Patient is having trouble with financial issues.   Social Determinants of Health   Financial Resource Strain: Not on file  Food Insecurity: Food Insecurity Present   Worried About Programme researcher, broadcasting/film/videounning Out of Food in the Last Year: Sometimes true   Baristaan Out of Food in the Last Year: Sometimes true  Transportation Needs: Unmet Transportation Needs   Lack of Transportation (Medical): Yes   Lack of Transportation (Non-Medical): Yes  Physical Activity: Not on file  Stress: Not on file  Social Connections: Not on file    Family History: Family History  Problem Relation Age  of Onset   Diabetes Mother    Hypertension Mother    Diabetes Father    Hypertension Father     Allergies: Allergies  Allergen Reactions   Penicillins Rash    Childhood rash, Tolerated cefazolin (2021) Has patient had a PCN reaction causing immediate rash, facial/tongue/throat swelling, SOB or lightheadedness with hypotension: no Has patient had a PCN reaction causing severe rash involving mucus membranes or skin necrosis:no Has patient had a PCN reaction that required hospitalization no Has patient had a PCN reaction occurring within the last 10 years: no If all of the above answers are "NO", then may proceed with Cephalosporin use.    Medications Prior to Admission  Medication Sig Dispense Refill Last Dose   acetaminophen (TYLENOL) 325 MG tablet Take 2 tablets (650 mg total) by mouth every 4 (four) hours as needed. 100 tablet 2    albuterol (VENTOLIN HFA) 108 (90 Base) MCG/ACT inhaler Inhale 2 puffs  into the lungs every 6 (six) hours as needed for wheezing or shortness of breath. 18 g 1    aspirin EC 81 MG tablet Take 1 tablet (81 mg total) by mouth daily. 60 tablet 2    Blood Pressure Monitoring (BLOOD PRESSURE MONITOR AUTOMAT) DEVI 1 Device by Does not apply route daily. Automatic blood pressure cuff regular/x-large size cuff. To monitor blood pressure regularly at home. ICD-10 code: O09.90. 1 each 0    Capsaicin 0.033 % CREA Apply 1 Film topically 3 (three) times daily as needed. 56.6 g 0    clindamycin (CLEOCIN) 300 MG capsule Take 300 mg by mouth 3 (three) times daily.      [employer] HEMORRHOIDAL & ANALGESIC 1 % ointment SMARTSIG:1 Application Rectally PRN      cyclobenzaprine (FLEXERIL) 10 MG tablet Take 1 tablet (10 mg total) by mouth 2 (two) times daily as needed for muscle spasms. 20 tablet 0    dibucaine (NUPERCAINAL) 1 % OINT Place 1 application rectally as needed for hemorrhoids. 28 g 1    docusate sodium (COLACE) 100 MG capsule Take 1 capsule (100 mg total) by mouth 2 (two) times daily. 60 capsule 2    ferrous sulfate (FERROUSUL) 325 (65 FE) MG tablet Take 1 tablet (325 mg total) by mouth every other day. 30 tablet 3    fluticasone (FLOVENT HFA) 110 MCG/ACT inhaler Inhale 2 puffs into the lungs in the morning and at bedtime. 12 g 12    pantoprazole (PROTONIX) 40 MG tablet Take 1 tablet (40 mg total) by mouth daily. 30 tablet 1    polyethylene glycol (MIRALAX) 17 g packet Take 17 g by mouth daily. Until bowel movement successful. 14 each 0    Prenatal 27-1 MG TABS Take 1 tablet by mouth daily. 30 tablet 12    promethazine (PHENERGAN) 25 MG suppository Place 1 suppository (25 mg total) rectally every 6 (six) hours as needed for nausea or vomiting. 12 each 2    promethazine (PHENERGAN) 25 MG tablet Take 1 tablet (25 mg total) by mouth every 6 (six) hours as needed for nausea or vomiting. 30 tablet 5    scopolamine (TRANSDERM-SCOP) 1 MG/3DAYS Place 1 patch (1.5 mg total) onto the skin every 3  (three) days. 10 patch 2    sucralfate (CARAFATE) 1 GM/10ML suspension Take 10 mLs (1 g total) by mouth 4 (four) times daily -  with meals and at bedtime. 420 mL 0    Review of Systems   All systems reviewed and negative except as  stated in HPI  Last menstrual period 04/08/2020, not currently breastfeeding. General appearance: alert and no distress Lungs: normal effort Heart: regular rate  Abdomen: soft, non-tender; bowel sounds normal Pelvic: gravid uterus GU: No vaginal lesions Extremities: Homans sign is negative, no sign of DVT Presentation: cephalic Fetal monitoring: Baseline: 135 bpm, Variability: Good {> 6 bpm), and Accelerations: Reactive Uterine activity: Frequency: Every 1.5-4 minutes Dilation: 5 Effacement (%): 60 Station: -3 Exam by:: m wilkins rnc  Prenatal labs: ABO, Rh: --/--/O POS (07/25 1610) Antibody: NEG (07/25 0807) Rubella:  Unknown  RPR: Non Reactive (05/13 0930)  HBsAg:  Unknown  HIV: Non Reactive (05/13 0930)  GBS: Negative/-- (07/06 1517)  2 hr Glucola: Negative  Genetic screening: Low risk for trisomy 21, 18, and 13, monosomy X, and triploidy, 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  Anatomy US: membranous VSD, all other anatomy normal   Prenatal Transfer Tool  Maternal Diabetes: No Genetic Screening: Normal Maternal Ultrasounds/Referrals: Fetal Heart Anomalies Fetal Ultrasounds or other Referrals:  Fetal echo Maternal Substance Abuse:  Yes:  Type: Smoker, Marijuana Significant Maternal Medications:  Meds include: Progesterone Protonix Other: Cleocin, Flovent, Ventolin  Significant Maternal Lab Results: Group B Strep negative  Results for orders placed or performed during the hospital encounter of 01/16/21 (from the past 24 hour(s))  Type and screen   Collection Time: 01/16/21  8:07 AM  Result Value Ref Range   ABO/RH(D) O POS    Antibody Screen NEG    Sample Expiration      01/19/2021,2359 Performed at [facility], [address], [place], [place]  [address]   CBC   Collection Time: 01/16/21  8:09 AM  Result Value Ref Range   WBC 9.0 4.0 - 10.5 K/uL   RBC 3.29 (L) 3.87 - 5.11 MIL/uL   Hemoglobin 10.8 (L) 12.0 - 15.0 g/dL   HCT 40.9 (L) 81.1 - 91.4 %   MCV 93.3 80.0 - 100.0 fL   MCH 32.8 26.0 - 34.0 pg   MCHC 35.2 30.0 - 36.0 g/dL   RDW 78.2 95.6 - 21.3 %   Platelets 198 150 - 400 K/uL   nRBC 0.0 0.0 - 0.2 %    Patient Active Problem List   Diagnosis Date Noted   IUP (intrauterine pregnancy), incidental 01/16/2021   Fetal cardiac anomaly affecting pregnancy, antepartum 10/20/2020   Hx of HELLP syndrome, currently pregnant, second trimester 09/16/2020   Hx of preterm delivery, currently pregnant, second trimester 09/16/2020   Supervision of high risk pregnancy, antepartum 05/23/2020   Seizure (HCC) 11/16/2019   Carrier of disease 07/01/2019   Sickle cell trait (HCC)    Obesity in pregnancy, antepartum 06/12/2019   Tobacco use 10/28/2018   Asthma 10/27/2018   Complex cyst of right ovary 05/10/2017   Nausea and vomiting during pregnancy 11/09/2016    Assessment/Plan:  Leslie Yates is a 30 y.o. G2P0101 at [redacted]w[redacted]d here for IOL due to a hx of HELLP syndrome.   Induction of Labor - IOL with foley balloon placement in MAU yesterday 7/24, foley balloon is out - Pitocin is to be started  - GBS negative  - Rubella, Hep B, and Hep C ordered - Anticipated vaginal delivery   Pain - Currently well tolerated  - Epidural to be placed   #FWB: Category I  #MOF: Breastfeeding  #MOC: Inpatient Nexplanon   Athena Masse, PA Student  01/16/2021, 9:25 AM  Midwife attestation: I have seen and examined this patient; I agree with above documentation in  the student's note.   PE: Gen: calm comfortable, NAD Resp: normal effort and rate Abd: gravid  ROS, labs, PMH reviewed  Assessment/Plan: [redacted] weeks gestation Labor: latent FWB: Cat I GBS: neg Admit to LD IOL with Pitocin PEC labs for hx HELLP Anticipate SVD  [name], CNM  01/16/2021, 2:01 PM

## 2021-01-16 NOTE — Plan of Care (Signed)
  Problem: Education: Goal: Knowledge of condition will improve Outcome: Progressing   Problem: Activity: Goal: Will verbalize the importance of balancing activity with adequate rest periods Outcome: Progressing Goal: Ability to tolerate increased activity will improve Outcome: Progressing   

## 2021-01-16 NOTE — Progress Notes (Signed)
Labor Progress Note Leslie Yates is a 30 y.o. G2P0101 at [redacted]w[redacted]d presented for IOL  S:  Sitting up in bed, leaning over meal tray. Feeling nauseated, already had Zofran.   O:  BP 120/66   Pulse (!) 52   Temp 98.2 F (36.8 C)   LMP 04/08/2020 (Exact Date)  EFM: baseline 135 bpm/ mod variability/ + accels/ no decels  Toco/IUPC: 2-4 SVE: Dilation: 7 Effacement (%): 80 Cervical Position: Middle Station: -2 Presentation: Vertex Exam by:: [name] Pitocin: 12 mu/min  A/P: 30 y.o. G2P0101 [redacted]w[redacted]d  1. Labor: active 2. FWB: Cat 3. Pain: epidural  Consented for AROM, mod amt clear fluid. Will obtain EKG for bradycardia, baseline HR usually 60-70s. Antiemetics prn. Anticipate labor progress and SVD.  [name], CNM 6:50 PM

## 2021-01-16 NOTE — Discharge Summary (Addendum)
Postpartum Discharge Summary       Patient Name: Leslie Yates DOB: 25-Apr-1991 MRN: [id number]  Date of admission: 01/16/2021 Delivery date:01/16/2021  Delivering provider: Renee Harder  Date of discharge: 01/19/2021  Admitting diagnosis: IUP (intrauterine pregnancy), incidental [Z33.1] Intrauterine pregnancy: [redacted]w[redacted]d    Secondary diagnosis:  Active Problems:   IUP (intrauterine pregnancy), incidental  Additional problems: seizure like activity PPD 2    Discharge diagnosis: Term Pregnancy Delivered                                              Post partum procedures:blood transfusion and magnesium sulfate recovery Augmentation: AROM, Pitocin, and OP Foley Complications: None  Hospital course: Induction of Labor With Vaginal Delivery   30y.o. yo G2P0101 at 361w2das admitted to the hospital 01/16/2021 for induction of labor.  Indication for induction: Elective.  Patient had an uncomplicated labor course as follows: Membrane Rupture Time/Date:  ,   Delivery Method:Vaginal, Spontaneous  Episiotomy:   Lacerations:  Labial  Details of delivery can be found in separate delivery note.  Pt had a postpartum hemorrhage with significant blood loss of 2L.  She received  blood transfusion. Patient is discharged home 01/19/21.  Newborn Data: Birth date:01/16/2021  Birth time:9:03 PM  Gender:Female  Living status:Living  Apgars:8, 9 Weight:3439 grams  Magnesium Sulfate received: yes, PPD 2 BMZ received: No Rhophylac:N/A MMR:No T-DaP:Given prenatally Flu: N/A Transfusion:yes  Physical exam  Vitals:   01/19/21 0400 01/19/21 0500 01/19/21 0600 01/19/21 0835  BP:    (!) 124/54  Pulse:    69  Resp: 18 16 20 19   Temp:    98.1 F (36.7 C)  TempSrc:    Axillary  SpO2:      Weight:      Height:       General: alert, cooperative, and no distress Lochia: appropriate Uterine Fundus: firm Incision: N/A DVT Evaluation: No evidence of DVT seen on physical exam. Negative  Homan's sign. No cords or calf tenderness. No significant calf/ankle edema. Labs: Lab Results  Component Value Date   WBC 12.5 (H) 01/18/2021   HGB 7.4 (L) 01/18/2021   HCT 21.0 (L) 01/18/2021   MCV 92.1 01/18/2021   PLT 161 01/18/2021   CMP Latest Ref Rng & Units 01/18/2021  Glucose 70 - 99 mg/dL 111(H)  BUN 6 - 20 mg/dL 5(L)  Creatinine 0.44 - 1.00 mg/dL 0.70  Sodium 135 - 145 mmol/L 138  Potassium 3.5 - 5.1 mmol/L 3.5  Chloride 98 - 111 mmol/L 109  CO2 22 - 32 mmol/L 21(L)  Calcium 8.9 - 10.3 mg/dL 8.3(L)  Total Protein 6.5 - 8.1 g/dL 4.7(L)  Total Bilirubin 0.3 - 1.2 mg/dL 0.7  Alkaline Phos 38 - 126 U/L 55  AST 15 - 41 U/L 22  ALT 0 - 44 U/L 12   Edinburgh Score: Edinburgh Postnatal Depression Scale Screening Tool 01/17/2021  I have been able to laugh and see the funny side of things. 1  I have looked forward with enjoyment to things. 1  I have blamed myself unnecessarily when things went wrong. 2  I have been anxious or worried for no good reason. 2  I have felt scared or panicky for no good reason. 0  Things have been getting on top of me. 1  I have been so unhappy  that I have had difficulty sleeping. 2  I have felt sad or miserable. 2  I have been so unhappy that I have been crying. 1  The thought of harming myself has occurred to me. 0  Edinburgh Postnatal Depression Scale Total 12     After visit meds:  Allergies as of 01/19/2021       Reactions   Penicillins Rash   Childhood rash, Tolerated cefazolin (2021) Has patient had a PCN reaction causing immediate rash, facial/tongue/throat swelling, SOB or lightheadedness with hypotension: no Has patient had a PCN reaction causing severe rash involving mucus membranes or skin necrosis:no Has patient had a PCN reaction that required hospitalization no Has patient had a PCN reaction occurring within the last 10 years: no If all of the above answers are "NO", then may proceed with Cephalosporin use.         Medication List     STOP taking these medications    aspirin EC 81 MG tablet   Capsaicin 0.033 % Crea   cyclobenzaprine 10 MG tablet Commonly known as: FLEXERIL   dibucaine 1 % Oint Commonly known as: NUPERCAINAL   pantoprazole 40 MG tablet Commonly known as: Protonix   polyethylene glycol 17 g packet Commonly known as: MiraLax   promethazine 25 MG suppository Commonly known as: PHENERGAN   promethazine 25 MG tablet Commonly known as: PHENERGAN   scopolamine 1 MG/3DAYS Commonly known as: TRANSDERM-SCOP   sucralfate 1 GM/10ML suspension Commonly known as: Carafate       TAKE these medications    acetaminophen 325 MG tablet Commonly known as: Tylenol Take 2 tablets (650 mg total) by mouth every 4 (four) hours as needed.   albuterol 108 (90 Base) MCG/ACT inhaler Commonly known as: VENTOLIN HFA Inhale 2 puffs into the lungs every 6 (six) hours as needed for wheezing or shortness of breath.   Blood Pressure Monitor Automat Devi 1 Device by Does not apply route daily. Automatic blood pressure cuff regular/x-large size cuff. To monitor blood pressure regularly at home. ICD-10 code: O09.90.   clindamycin 300 MG capsule Commonly known as: CLEOCIN Take 300 mg by mouth 3 (three) times daily.   [employer] Hemorrhoidal & Analgesic 1 % ointment Generic drug: dibucaine SMARTSIG:1 Application Rectally PRN   docusate sodium 100 MG capsule Commonly known as: COLACE Take 1 capsule (100 mg total) by mouth 2 (two) times daily.   ferrous sulfate 325 (65 FE) MG tablet Commonly known as: FerrouSul Take 1 tablet (325 mg total) by mouth every other day.   Flovent HFA 110 MCG/ACT inhaler Generic drug: fluticasone Inhale 2 puffs into the lungs in the morning and at bedtime.   ibuprofen 600 MG tablet Commonly known as: ADVIL Take 1 tablet (600 mg total) by mouth every 6 (six) hours.   Prenatal 27-1 MG Tabs Take 1 tablet by mouth daily.        Discharge home in stable  condition Infant Feeding:  breast and bottle Infant Disposition:home with mother Discharge instruction: per After Visit Summary and Postpartum booklet. Activity: Advance as tolerated. Pelvic rest for 6 weeks.  Diet: routine diet Future Appointments:No future appointments. Follow up Visit:  [facility] [place] [facility] Follow up in 1 week(s).   Specialty: Obstetrics and Gynecology Why: blood pressure check, postpartum in 4 weeks with IUD placement Contact information: [address] [place] [address] [phone]  Message sent to [facility] by Maryagnes Amos, CNM Please schedule this patient for a Virtual postpartum visit in 4 weeks with the following provider: Any provider. Additional Postpartum F/U:Postpartum Depression checkup and BP check 1 week  Low risk pregnancy complicated by:  hx of HELLP Delivery mode:  Vaginal, Spontaneous  Anticipated Birth Control:  progesterone IUD Pt will be referred to outpatient neurology due to seizure like activity in the hospital    01/19/2021 Griffin Basil, MD

## 2021-01-17 ENCOUNTER — Encounter (HOSPITAL_COMMUNITY): Payer: Self-pay | Admitting: Obstetrics and Gynecology

## 2021-01-17 ENCOUNTER — Other Ambulatory Visit: Payer: Self-pay

## 2021-01-17 LAB — CBC
HCT: 23.1 % — ABNORMAL LOW (ref 36.0–46.0)
HCT: 21.7 % — ABNORMAL LOW (ref 36.0–46.0)
Hemoglobin: 8.4 g/dL — ABNORMAL LOW (ref 12.0–15.0)
Hemoglobin: 7.9 g/dL — ABNORMAL LOW (ref 12.0–15.0)
MCH: 33.1 pg (ref 26.0–34.0)
MCH: 32.5 pg (ref 26.0–34.0)
MCHC: 36.4 g/dL — ABNORMAL HIGH (ref 30.0–36.0)
MCHC: 36.4 g/dL — ABNORMAL HIGH (ref 30.0–36.0)
MCV: 90.9 fL (ref 80.0–100.0)
MCV: 89.3 fL (ref 80.0–100.0)
Platelets: 183 10*3/uL (ref 150–400)
Platelets: 151 10*3/uL (ref 150–400)
RBC: 2.54 MIL/uL — ABNORMAL LOW (ref 3.87–5.11)
RBC: 2.43 MIL/uL — ABNORMAL LOW (ref 3.87–5.11)
RDW: 12.9 % (ref 11.5–15.5)
RDW: 12.5 % (ref 11.5–15.5)
WBC: 12.2 10*3/uL — ABNORMAL HIGH (ref 4.0–10.5)
WBC: 11 10*3/uL — ABNORMAL HIGH (ref 4.0–10.5)
nRBC: 0 % (ref 0.0–0.2)
nRBC: 0 % (ref 0.0–0.2)

## 2021-01-17 LAB — CBC WITH DIFFERENTIAL/PLATELET
Abs Immature Granulocytes: 0.08 10*3/uL — ABNORMAL HIGH (ref 0.00–0.07)
Basophils Absolute: 0 10*3/uL (ref 0.0–0.1)
Basophils Relative: 0 %
Eosinophils Absolute: 0 10*3/uL (ref 0.0–0.5)
Eosinophils Relative: 0 %
HCT: 19.7 % — ABNORMAL LOW (ref 36.0–46.0)
Hemoglobin: 7.1 g/dL — ABNORMAL LOW (ref 12.0–15.0)
Immature Granulocytes: 1 %
Lymphocytes Relative: 10 %
Lymphs Abs: 1.3 10*3/uL (ref 0.7–4.0)
MCH: 32.7 pg (ref 26.0–34.0)
MCHC: 36 g/dL (ref 30.0–36.0)
MCV: 90.8 fL (ref 80.0–100.0)
Monocytes Absolute: 0.8 10*3/uL (ref 0.1–1.0)
Monocytes Relative: 6 %
Neutro Abs: 10.7 10*3/uL — ABNORMAL HIGH (ref 1.7–7.7)
Neutrophils Relative %: 83 %
Platelets: 151 10*3/uL (ref 150–400)
RBC: 2.17 MIL/uL — ABNORMAL LOW (ref 3.87–5.11)
RDW: 12.7 % (ref 11.5–15.5)
WBC: 12.8 10*3/uL — ABNORMAL HIGH (ref 4.0–10.5)
nRBC: 0 % (ref 0.0–0.2)

## 2021-01-17 LAB — COMPREHENSIVE METABOLIC PANEL
ALT: 11 U/L (ref 0–44)
AST: 18 U/L (ref 15–41)
Albumin: 2.5 g/dL — ABNORMAL LOW (ref 3.5–5.0)
Alkaline Phosphatase: 54 U/L (ref 38–126)
Anion gap: 6 (ref 5–15)
BUN: <5 mg/dL — ABNORMAL LOW (ref 6–20)
CO2: 23 mmol/L (ref 22–32)
Calcium: 8 mg/dL — ABNORMAL LOW (ref 8.9–10.3)
Chloride: 109 mmol/L (ref 98–111)
Creatinine, Ser: 0.76 mg/dL (ref 0.44–1.00)
GFR, Estimated: >60 mL/min (ref >60)
Glucose, Bld: 95 mg/dL (ref 70–99)
Potassium: 3.5 mmol/L (ref 3.5–5.1)
Sodium: 138 mmol/L (ref 135–145)
Total Bilirubin: 0.5 mg/dL (ref 0.3–1.2)
Total Protein: 4.6 g/dL — ABNORMAL LOW (ref 6.5–8.1)

## 2021-01-17 LAB — POSTPARTUM HEMORRHAGE PROTOCOL (BB NOTIFICATION)

## 2021-01-17 LAB — RAPID URINE DRUG SCREEN, HOSP PERFORMED
Amphetamines: NOT DETECTED
Barbiturates: NOT DETECTED
Benzodiazepines: NOT DETECTED
Cocaine: NOT DETECTED
Opiates: NOT DETECTED
Tetrahydrocannabinol: POSITIVE — AB

## 2021-01-17 LAB — PREPARE RBC (CROSSMATCH)

## 2021-01-17 LAB — RUBELLA SCREEN: Rubella: 1.36 index (ref >0.99)

## 2021-01-17 MED ORDER — KETOROLAC TROMETHAMINE 30 MG/ML IJ SOLN
15.0000 mg | Freq: Once | INTRAMUSCULAR | Status: AC
  Administered 2021-01-17: 15 mg via INTRAVENOUS
  Filled 2021-01-17: qty 1

## 2021-01-17 MED ORDER — TRANEXAMIC ACID-NACL 1000-0.7 MG/100ML-% IV SOLN
INTRAVENOUS | Status: AC
  Administered 2021-01-17: 1000 mg
  Filled 2021-01-17: qty 100

## 2021-01-17 MED ORDER — MISOPROSTOL 200 MCG PO TABS
ORAL_TABLET | ORAL | Status: AC
  Administered 2021-01-17: 200 ug
  Filled 2021-01-17: qty 5

## 2021-01-17 MED ORDER — METHYLERGONOVINE MALEATE 0.2 MG PO TABS
0.2000 mg | ORAL_TABLET | ORAL | Status: AC
  Administered 2021-01-17 – 2021-01-18 (×3): 0.2 mg via ORAL
  Filled 2021-01-17 (×4): qty 1

## 2021-01-17 MED ORDER — TRANEXAMIC ACID-NACL 1000-0.7 MG/100ML-% IV SOLN
1000.0000 mg | INTRAVENOUS | Status: AC
  Administered 2021-01-17: 1000 mg via INTRAVENOUS
  Filled 2021-01-17: qty 100

## 2021-01-17 MED ORDER — LACTATED RINGERS IV BOLUS
1000.0000 mL | Freq: Once | INTRAVENOUS | Status: AC
  Administered 2021-01-17: 1000 mL via INTRAVENOUS

## 2021-01-17 MED ORDER — FENTANYL CITRATE (PF) 100 MCG/2ML IJ SOLN
50.0000 ug | Freq: Once | INTRAMUSCULAR | Status: AC
Start: 2021-01-17 — End: 2021-01-17
  Administered 2021-01-17: 50 ug via INTRAVENOUS

## 2021-01-17 MED ORDER — FENTANYL CITRATE (PF) 100 MCG/2ML IJ SOLN
INTRAMUSCULAR | Status: AC
  Administered 2021-01-17: 100 ug
  Filled 2021-01-17: qty 2

## 2021-01-17 MED ORDER — METHYLERGONOVINE MALEATE 0.2 MG/ML IJ SOLN
INTRAMUSCULAR | Status: AC
  Administered 2021-01-17: 0.2 mg
  Filled 2021-01-17: qty 1

## 2021-01-17 MED ORDER — OXYTOCIN-SODIUM CHLORIDE 30-0.9 UT/500ML-% IV SOLN
2.5000 [IU]/h | INTRAVENOUS | Status: DC
  Administered 2021-01-17: 2.5 [IU]/h via INTRAVENOUS
  Filled 2021-01-17: qty 500

## 2021-01-17 MED ORDER — TRANEXAMIC ACID-NACL 1000-0.7 MG/100ML-% IV SOLN: 1000.0000 mg | Freq: Once | INTRAVENOUS | Status: AC

## 2021-01-17 MED ORDER — SCOPOLAMINE 1 MG/3DAYS TD PT72
1.0000 | MEDICATED_PATCH | TRANSDERMAL | Status: DC
  Administered 2021-01-17: 1.5 mg via TRANSDERMAL
  Filled 2021-01-17: qty 1

## 2021-01-17 MED ORDER — OXYTOCIN-SODIUM CHLORIDE 30-0.9 UT/500ML-% IV SOLN
INTRAVENOUS | Status: AC
  Filled 2021-01-17: qty 500

## 2021-01-17 MED ORDER — FERROUS SULFATE 325 (65 FE) MG PO TABS
325.0000 mg | ORAL_TABLET | Freq: Every day | ORAL | Status: DC
  Administered 2021-01-19: 325 mg via ORAL
  Filled 2021-01-17: qty 1

## 2021-01-17 MED ORDER — SODIUM CHLORIDE 0.9% IV SOLUTION
Freq: Once | INTRAVENOUS | Status: AC
  Administered 2021-01-17: 1 mL via INTRAVENOUS

## 2021-01-17 MED ORDER — SODIUM CHLORIDE 0.9 % IV SOLN
25.0000 mg | Freq: Four times a day (QID) | INTRAVENOUS | Status: DC | PRN
  Administered 2021-01-17 – 2021-01-19 (×4): 25 mg via INTRAVENOUS
  Filled 2021-01-17 (×5): qty 1

## 2021-01-17 MED ORDER — OXYTOCIN 10 UNIT/ML IJ SOLN
INTRAMUSCULAR | Status: AC
  Filled 2021-01-17: qty 2

## 2021-01-17 MED ORDER — LACTATED RINGERS IV SOLN: INTRAVENOUS | Status: DC

## 2021-01-17 MED ORDER — FENTANYL CITRATE (PF) 100 MCG/2ML IJ SOLN: 100.0000 ug | Freq: Once | INTRAMUSCULAR | Status: AC

## 2021-01-17 MED ORDER — MISOPROSTOL 200 MCG PO TABS
ORAL_TABLET | ORAL | Status: AC
  Administered 2021-01-17: 800 ug via ORAL
  Filled 2021-01-17: qty 4

## 2021-01-17 MED ORDER — SODIUM CHLORIDE 0.9 % IV SOLN
500.0000 mg | Freq: Once | INTRAVENOUS | Status: DC
  Administered 2021-01-17: 500 mg via INTRAVENOUS
  Filled 2021-01-17: qty 25

## 2021-01-17 MED ORDER — FENTANYL CITRATE (PF) 100 MCG/2ML IJ SOLN
INTRAMUSCULAR | Status: AC
  Filled 2021-01-17: qty 2

## 2021-01-17 NOTE — Progress Notes (Signed)
POSTPARTUM PROGRESS NOTE  Subjective: Leslie Yates is currently feeling nauseous and is vomiting. Reports that she has lower abdominal pain that has worsened in severity. Lochia is scant. Denies HA, diarrhea, or pain anywhere else.   Objective: Blood pressure 118/67, pulse 65, temperature 98.4 F (36.9 C), temperature source Oral, resp. rate 18, height 6\' 2"  (1.88 m), weight 118 kg, last menstrual period 04/08/2020, SpO2 98 %, unknown if currently breastfeeding.  Physical Exam:  General: alert, cooperative and no distress Chest: no respiratory distress Abdomen: soft, mildly tender in lower abdomen Uterine Fundus: firm and at level of umbilicus Extremities: No calf swelling or tenderness  No edema  Recent Labs    01/17/21 0152 01/17/21 0749  HGB 8.4* 7.1*  HCT 23.1* 19.7*    Assessment/Plan:  Will continue with IV Toradol. S/P IVF and scop patch. We will get a UDS, and follow up on CBC/CMP. Continue to closely monitor vitals with PM check.   01/19/21, MD Faculty Practice, Center for [facility] 01/17/2021 5:30 PM

## 2021-01-17 NOTE — Progress Notes (Addendum)
Post Partum Day 1 Subjective: Leslie Yates reports not feeling well this morning. She is tearful and scared about the situation she had with the PPH early this morning and states she is scared. She feels very weak and dizzy when she stands and is having extreme pain in her abdomen and back. She says the bleeding has decreased significantly. She has been able to urinate. She has not been able to get up independently secondary to the weakness.   Objective: Blood pressure 132/67, pulse 64, temperature 98.2 F (36.8 C), temperature source Oral, resp. rate 18, height 6\' 2"  (1.88 m), weight 118 kg, last menstrual period 04/08/2020, SpO2 100 %, unknown if currently breastfeeding.  Physical Exam:  General: moderate distress and crying  Cardiology: Normal rate Pulmonary: wheezing auscultated in the right lobe  Lochia: bleeding has improved significantly since this morning, lochia is appropriate now  Abdomen: lower abdominal tenderness to palpation  Uterine Fundus: firm, extremely tender to palpation  Incision: N/A DVT Evaluation: No cords or calf tenderness. No significant calf/ankle edema.  Recent Labs    01/16/21 0809 01/17/21 0152  HGB 10.8* 8.4*  HCT 30.7* 23.1*    Assessment/Plan:  PPH  - Estimated 1.5 L of blood loss  - Patient was treated with TXA, Cytotec, Pitocin, and Methergine - Morning hgb 7.1 from 10.8, 1 unit of blood ordered   Asthma  - Wheezing currently, will order albuterol   Contraception: Nexplanon  Feeding: Breastfeeding   Disposition: Will likely be discharged tomorrow pending improvement.    LOS: 1 day   01/19/21 01/17/2021, 8:16 AM   GME ATTESTATION:  I saw and evaluated the patient. I agree with the findings and the plan of care as documented in the student's note.  On my evaluation, she was resting and breathing comfortably with 1U pRBCs transfusing. Will reassess in the afternoon. Plan on nexplanon placement tomorrow.   01/19/2021, DO OB  Fellow, Faculty [facility], Center for [facility] 01/17/2021 9:34 AM

## 2021-01-17 NOTE — Progress Notes (Signed)
Dr. Dorma Russell updated on pt status, continuation of emesis occurences, leaving the floor, and pt request for Phenergan. New orders placed.   Elvia Collum, RN 01/17/21

## 2021-01-17 NOTE — Lactation Note (Signed)
This note was copied from a baby's chart. Lactation Consultation Note LC attempted to see mom. Room dark.  LC spoke w/RN asking if mom was still sick. RN stated yes she is still sick and not feeling well. Support person is formula bottle feeding baby. RN suggest to see mom in am. Asked RN to call for [facility] tonight if mom is feeling better.  Patient Name: Leslie Yates [id number]'L Date: 01/17/2021   Age:30 hours  Maternal Data    Feeding Nipple Type: Slow - flow  LATCH Score                    Lactation Tools Discussed/Used    Interventions    Discharge    Consult Status      Charyl Dancer 01/17/2021, 11:59 PM

## 2021-01-17 NOTE — Lactation Note (Signed)
This note was copied from a baby's chart. Lactation Consultation Note  Patient Name: Leslie Yates DCVUD'T Date: 01/17/2021 Reason for consult: Term;Other (Comment) (per [facility] - mom had a PPH ( setting up Iron infusion ) and was asked to see the mom and baby later. baby had as formula 27ml x 3 . LC will F/U/ P2) Age:30 hours  Maternal Data    Feeding Mother's Current Feeding Choice: Breast Milk and Formula Nipple Type: Extra Slow Flow  LATCH Score                    Lactation Tools Discussed/Used    Interventions    Discharge    Consult Status Consult Status: Follow-up Date: 01/17/21 Follow-up type: In-patient    Matilde Sprang Dynasia Kercheval 01/17/2021, 8:43 AM

## 2021-01-17 NOTE — Anesthesia Postprocedure Evaluation (Signed)
Anesthesia Post Note  Patient: Leslie Yates  Procedure(s) Performed: AN AD HOC LABOR EPIDURAL     Patient location during evaluation: Mother Baby Anesthesia Type: Epidural Level of consciousness: awake and alert Pain management: pain level controlled Vital Signs Assessment: post-procedure vital signs reviewed and stable Respiratory status: spontaneous breathing, nonlabored ventilation and respiratory function stable Cardiovascular status: stable Postop Assessment: no headache, no backache, epidural receding, patient able to bend at knees, no apparent nausea or vomiting, adequate PO intake and able to ambulate Anesthetic complications: no   No notable events documented.  Last Vitals:  Vitals:   01/17/21 1113 01/17/21 1207  BP: 117/68 138/79  Pulse: 77 85  Resp: 18 19  Temp: 36.8 C 36.7 C  SpO2: 100% 100%    Last Pain:  Vitals:   01/17/21 1207  TempSrc: Oral  PainSc:    Pain Goal:                   Dairl Ponder

## 2021-01-17 NOTE — Progress Notes (Signed)
0109 - Pt called out c/o of bleeding.  0110- Found pt in BR bleeding with clots and 2 other nurses at bedside. Assisted pt to cleaned and back to bed.  0117 - Called D. Simpson, cnm to evaluate.  0120 - VS WNL, fundus firm and at umbilicus, bleeding large with clots. Camelia Eng, cnm to bedside.  Sterile vag exam done by cnm to evacuate any clots as well as orders given.  0940 - Dr. Despina Hidden called to bedside as well as Code PPH.  0142 - Dr. Despina Hidden at bedside with other staff. Additional orders received and administered. Emotional support provided to pt the entire time. All procedures and meds explained to pt and family. See MD and CNM notes for additional information.  0215 - Pt stable, VS WNL, Will continue to monitor. Total EBL 

## 2021-01-17 NOTE — Progress Notes (Signed)
Pt called out to front desk crying at 1920. This RN responded and found pt bent over with head in the trash can. While hyperventilating and and complaining of nausea and dizziness pt stated "I need to go outside now! This happens to me at home and all I need is air." Pt proceeded to saline lock her IV and put on shorts. This RN pleaded with pt to remain in room until bedside shift report was given and the pt and her support person/friend agreed to stay until previous RN Herbert Seta had given report.   This RN left room with pt and support person seated to remove full trash cans and upon return, not even 2 minutes later, pt was not in room. This RN searched 4th floor, valet circle/front entrance, [employer], and walkway to parking garage. Security noted pt did come down, but did not know where she had gone. Consulting civil engineer notified.   This RN returned to pt room to speak with support person who was staying with newborn. Support person told RN "I know exactly where she is. Please watch the baby and I'll bring her back. She's probably in the car." After 30 minutes total, support person returned wheeling pt back to room. After transferring back to bed, pt proceeded to vomit with dark brown emesis. Pt declined continuation of fluids and "just wanted to be left alone." Bed alarm placed and pt in bed with support person at  bedside and newborn in nursery.   Elvia Collum, RN 01/17/21

## 2021-01-17 NOTE — Progress Notes (Signed)
Interim Progress Note:   Called in to check in on patient after blood transfusion (1U pRBCs with hgb 7.1 with EBL >2800). RN reported she has continued to feel nauseous with numerous episodes of clear emesis over the course of the day since about 2 am with PPH. She received IV zofran approximately one hour ago with little relief.   Evaluated patient at bedside, appears tired and sitting over the trash can post emesis. Denies any chest pain, SOB, or associated abdominal pain. She has not had anything really to eat or drink today, had a small piece of broccoli. Lightheadedness and dizziness from earlier this morning has improved since RBCs. Lochia is mild.   Blood pressure 118/67, pulse 65, temperature 98.4 F (36.9 C), temperature source Oral, resp. rate 18, height 6\' 2"  (1.88 m), weight 118 kg, last menstrual period 04/08/2020, SpO2 98 %.   Gen: Tired appearing  Cardiac: RRR, no murmurs appreciated  Lungs: CTA with normal WOB  Abdomen: fundus firm and appropriately tender    A/p:  Postpartum N/V: Suspect related to PPH. Reassuring hemodynamically stable without additional s/sx to suggest concern for infectious or intra-abdominal process at this time. Will give 1L LR bolus + maintenance, scopolamine patch, and check CMP/post-transfusion CBC in 1 hour (transfusion ended at 1345).    RN aware of plan and to call if any changes.   04/10/2020, DO

## 2021-01-17 NOTE — Lactation Note (Signed)
This note was copied from a baby's chart. Lactation Consultation Note  Patient Name: Girl Azusena Erlandson LKGMW'N Date: 01/17/2021   Age:30 hours RN, Alfonzo Feller states mother is not feeling well to hold off on visiting with her at this time. We did talk about infant feeding only 5 ml and infant at 19 hrs. RN to observe next feeding to try and offer more with pace bottle feeding and extra slow flow nipple.    Maternal Data    Feeding Nipple Type: Extra Slow Flow  LATCH Score                    Lactation Tools Discussed/Used    Interventions    Discharge    Consult Status      Keahi Mccarney  Nicholson-Springer 01/17/2021, 4:09 PM

## 2021-01-17 NOTE — Progress Notes (Signed)
Patient ID: Leslie Yates, female   DOB: Apr 28, 1991, 30 y.o.   MRN: [id number] I was called to room just prior to a code hemorrhage Delivered at 2103 uncomplicated delivery Pt got up to the bathroom and had a large amount of clots and blood and then continued thereafter This occurred about 0120 or so I was called at 0140 At that point there was about 1500 cc of blood loss Vitals stable Pitocin drip was finished I ordered methergine 0.2 mg IM given 0245 TXA was hanging Lower uterine segment was explored and large amount of clot present with uterine atony appreciated Cytotec 800 buccal given Pitocin restarted with bolus to be followed by infusion over the next 8 hours I re examined at 0212 and now lower uterine segment with essentially no bleeding or clots Uterus is firm  Blood pressure 117/62, pulse (!) 58, temperature 97.6 F (36.4 C), temperature source Oral, resp. rate 18, last menstrual period 04/08/2020, SpO2 100 %, not currently breastfeeding. Stable Pt had large amount of emesis  Will continue methergine series CBC is drawn CBC 0152    Component Value Date/Time   WBC 12.2 (H) 01/17/2021 0152   RBC 2.54 (L) 01/17/2021 0152   HGB 8.4 (L) 01/17/2021 0152   HGB 10.7 (L) 11/04/2020 0930   HCT 23.1 (L) 01/17/2021 0152   HCT 30.0 (L) 11/04/2020 0930   PLT 183 01/17/2021 0152   PLT 189 11/04/2020 0930   MCV 90.9 01/17/2021 0152   MCV 89 11/04/2020 0930   MCH 33.1 01/17/2021 0152   MCHC 36.4 (H) 01/17/2021 0152   RDW 12.9 01/17/2021 0152   RDW 11.5 (L) 11/04/2020 0930   LYMPHSABS 0.4 (L) 07/05/2020 1640   LYMPHSABS 1.4 06/12/2019 1029   MONOABS 0.5 07/05/2020 1640   EOSABS 0.0 07/05/2020 1640   EOSABS 0.1 06/12/2019 1029   BASOSABS 0.0 07/05/2020 1640   BASOSABS 0.0 06/12/2019 1029    TXA to be repeated Pitocin drip for next 8 hours  Total blood loss 2800cc  Repeat CBC in 6 hhours  Lazaro Arms, MD 01/17/2021 2:24 AM

## 2021-01-17 NOTE — Progress Notes (Signed)
RN called CNM to assess patient's bleeding. Upon entering, large amount of blood found in toilet as well as large clots on chux pad. fentanyl IV ordered and manual evacuation of clots performed. Code hemorrhage called. TXA ordered. Cytotec PR given. Dr. Despina Hidden at bedside. IV Pitocin, Methergine 0.11mcg SQ ordered. Dr. Despina Hidden manually removed additional clots. VSS throughout entire process and patient remained asymptomatic. Bleeding minimal. Methergine PO series and restart Pitocin drip ordered per Dr. Despina Hidden. QBL: 2892    Brand Males, MSN, CNM 01/17/21 3:22 AM

## 2021-01-18 ENCOUNTER — Inpatient Hospital Stay (HOSPITAL_COMMUNITY): Payer: Medicaid Other

## 2021-01-18 DIAGNOSIS — R569 Unspecified convulsions: Secondary | ICD-10-CM

## 2021-01-18 DIAGNOSIS — F445 Conversion disorder with seizures or convulsions: Secondary | ICD-10-CM | POA: Diagnosis not present

## 2021-01-18 LAB — COMPREHENSIVE METABOLIC PANEL
ALT: 12 U/L (ref 0–44)
AST: 22 U/L (ref 15–41)
Albumin: 2.6 g/dL — ABNORMAL LOW (ref 3.5–5.0)
Alkaline Phosphatase: 55 U/L (ref 38–126)
Anion gap: 8 (ref 5–15)
BUN: 5 mg/dL — ABNORMAL LOW (ref 6–20)
CO2: 21 mmol/L — ABNORMAL LOW (ref 22–32)
Calcium: 8.3 mg/dL — ABNORMAL LOW (ref 8.9–10.3)
Chloride: 109 mmol/L (ref 98–111)
Creatinine, Ser: 0.7 mg/dL (ref 0.44–1.00)
GFR, Estimated: >60 mL/min (ref >60)
Glucose, Bld: 111 mg/dL — ABNORMAL HIGH (ref 70–99)
Potassium: 3.5 mmol/L (ref 3.5–5.1)
Sodium: 138 mmol/L (ref 135–145)
Total Bilirubin: 0.7 mg/dL (ref 0.3–1.2)
Total Protein: 4.7 g/dL — ABNORMAL LOW (ref 6.5–8.1)

## 2021-01-18 LAB — TYPE AND SCREEN
ABO/RH(D): O POS
Antibody Screen: NEGATIVE
Unit division: 0

## 2021-01-18 LAB — CBC WITH DIFFERENTIAL/PLATELET
Abs Immature Granulocytes: 0.08 10*3/uL — ABNORMAL HIGH (ref 0.00–0.07)
Basophils Absolute: 0 10*3/uL (ref 0.0–0.1)
Basophils Relative: 0 %
Eosinophils Absolute: 0 10*3/uL (ref 0.0–0.5)
Eosinophils Relative: 0 %
HCT: 21 % — ABNORMAL LOW (ref 36.0–46.0)
Hemoglobin: 7.4 g/dL — ABNORMAL LOW (ref 12.0–15.0)
Immature Granulocytes: 1 %
Lymphocytes Relative: 7 %
Lymphs Abs: 0.8 10*3/uL (ref 0.7–4.0)
MCH: 32.5 pg (ref 26.0–34.0)
MCHC: 35.2 g/dL (ref 30.0–36.0)
MCV: 92.1 fL (ref 80.0–100.0)
Monocytes Absolute: 0.5 10*3/uL (ref 0.1–1.0)
Monocytes Relative: 4 %
Neutro Abs: 11 10*3/uL — ABNORMAL HIGH (ref 1.7–7.7)
Neutrophils Relative %: 88 %
Platelets: 161 10*3/uL (ref 150–400)
RBC: 2.28 MIL/uL — ABNORMAL LOW (ref 3.87–5.11)
RDW: 13.1 % (ref 11.5–15.5)
WBC: 12.5 10*3/uL — ABNORMAL HIGH (ref 4.0–10.5)
nRBC: 0 % (ref 0.0–0.2)

## 2021-01-18 LAB — BPAM RBC
Blood Product Expiration Date: 202208222359
ISSUE DATE / TIME: 202207261140
Unit Type and Rh: 5100

## 2021-01-18 MED ORDER — MAGNESIUM SULFATE BOLUS VIA INFUSION
4.0000 g | Freq: Once | INTRAVENOUS | Status: AC
  Administered 2021-01-18: 4 g via INTRAVENOUS
  Filled 2021-01-18: qty 1000

## 2021-01-18 MED ORDER — MAGNESIUM SULFATE 40 GM/1000ML IV SOLN
2.0000 g/h | INTRAVENOUS | Status: DC
  Administered 2021-01-18 – 2021-01-19 (×2): 2 g/h via INTRAVENOUS
  Filled 2021-01-18 (×2): qty 1000

## 2021-01-18 MED ORDER — LACTATED RINGERS IV BOLUS
1000.0000 mL | Freq: Once | INTRAVENOUS | Status: AC
  Administered 2021-01-18: 1000 mL via INTRAVENOUS

## 2021-01-18 MED ORDER — SODIUM CHLORIDE 0.9 % IV SOLN
8.0000 mg | Freq: Once | INTRAVENOUS | Status: AC
  Administered 2021-01-18: 8 mg via INTRAVENOUS
  Filled 2021-01-18: qty 4

## 2021-01-18 NOTE — Lactation Note (Signed)
This note was copied from a baby's chart. Lactation Consultation Note  Patient Name: Leslie Yates Date: 01/18/2021 Reason for consult: Follow-up assessment;Term Age:30 hours  LC in to room for follow up. Mother is sleeping during visit. Partner is in room and states infant is feeding well. According to partner, infant seems to be doing better with slow flow nipple (yellow). Partner explains mother has been having seizures as well as emesis episode. Per partner, mother has not been able to breastfeed. Mother is receiving Mg infusion.  Encouraged to contact [facility] as needed for support.   Feeding Mother's Current Feeding Choice: Breast Milk and Formula Nipple Type: Nfant Standard Flow (white) (switched to Dr. Theora Gianotti preemie midway through)  Consult Status Consult Status: Follow-up Date: 01/19/21 Follow-up type: In-patient    Leslie Yates 01/18/2021, 2:13 PM

## 2021-01-18 NOTE — Progress Notes (Signed)
IV infiltrated during 2nd dose of Phenergan IVPB. Reports painful, tender, swollen IV site; IV removed. Pt declines any nausea medication at this time despite multiple emesis occurrences.   Elvia Collum, RN 01/18/21

## 2021-01-18 NOTE — Progress Notes (Signed)
RN called to room by patient's visitor. Patient noted to be actively seizing. RN called OB and rapid response. RN gave firm sternal rub seizure activity stopped and patient was able to state her name; seizure activity began again in less than a minute. CNM arrived gave additional sternal rub and seizure activity stopped.

## 2021-01-18 NOTE — Progress Notes (Signed)
Patient self inducing vomiting by sticking finger down her throat.

## 2021-01-18 NOTE — Progress Notes (Signed)
EEG complete - results pending 

## 2021-01-18 NOTE — Consult Note (Addendum)
NEURO HOSPITALIST CONSULT NOTE   Requesting physician: Dr. EcksCrissie Reese Reason for Consult: Seizure-like activity  History obtained from:  Patient / chart review    HPI:                                                                                                                                          Leslie Yates is a 30 y.o. female with PMH of asthma, sickle cell trait, marijuana abuse, marijuana induced cyclic vomiting.   Patient is post partum day 2 with witnessed seizure like events while on the OB service. Per family member patient had just come back from down stairs and had been vomiting. She sat down on the bed and felt shaky. She told her family member to get the nurse, and when she turned around she stated that the patient was lying back in bed, eyes closed, bilateral hands clenched, arms legs and head shaking. This episode lasted 7 minutes until sternal rubbed by CNM. At this time the episode subsided. She was able to answer " what is your name? Answered with only last name initially.  She was normal for approximately 1 minute, then had a second whole body shaking episode that lasted 1 minute. Again ceased with sternal rub. Patient does not remember any episodes, but stated that she knew it was about to occur.  She reported feeling shaky and recognizing that feeling from 2021 when it happened previously. Husband states "she never has seizures when I am around". Patient may have had a seizure at 30 year old. Only had seizures in 2021 when she was pregnant and nothing before or after that hospitalization until today. Of note: patient delivery was uncomplicated, but patient did have a post partum hemorrhage and stated that " she is scared". Major stressors of PP hemorrhage, newborn, cyclic vomiting, abdominal pain and back pain. Patient denies an vision changes, dizziness, room spinning, changes in sensation or HA.  Hospital course: 7/27:  BP was 128/82 today Seizure  like episode x 2 Neurology consulted  7/26: patient received 1 unit packed red cells for hgb 7.1 increased to 7.9 IV iron UDS + THC  7/25: vaginal delivery with PP hemorrhage given methergine, TXA, cytotec and IV pitocin given  Patient was first seen by our team 07/2019 at [redacted] weeks pregnant when she had "whole body shaking that stopped when you asked her to stop". She also had an episode of unresponsiveness. LTM at that time did not show any epileptiform discharges. She was not started on any anti-epileptics at that time. 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  Past Medical History:  Diagnosis Date   Asthma    Cyclical vomiting with nausea 10/27/2018   ED visits for vomiting and diarrhea since 2009-13  At least 4-5 references witnessing patient inducing vomiting by sticking fingers down throat Has had difficulty obtaining meds through the years GI consult made but never went   Elevated liver enzymes 11/19/2019   Gestational hypertension 11/17/2019   IOL @ 37 weeks   Lump of breast 06/12/2019   Obesity    Ovarian cyst    Sickle cell trait (HCC)     Past Surgical History:  Procedure Laterality Date   INCISION AND DRAINAGE ABSCESS      Family History  Problem Relation Age of Onset   Diabetes Mother    Hypertension Mother    Diabetes Father    Hypertension Father            Social History:  reports that she has been smoking cigarettes. She has a 2.75 pack-year smoking history. She has never used smokeless tobacco. She reports previous alcohol use. She reports current drug use. Drug: Marijuana.  Allergies  Allergen Reactions   Penicillins Rash    Childhood rash, Tolerated cefazolin (2021) Has patient had a PCN reaction causing immediate rash, facial/tongue/throat swelling, SOB or lightheadedness with hypotension: no Has patient had a PCN reaction causing severe rash involving mucus membranes or skin necrosis:no Has patient had a PCN reaction that required hospitalization  no Has patient had a PCN reaction occurring within the last 10 years: no If all of the above answers are "NO", then may proceed with Cephalosporin use.    MEDICATIONS:                                                                                                                     Prior to Admission:  Medications Prior to Admission  Medication Sig Dispense Refill Last Dose   acetaminophen (TYLENOL) 325 MG tablet Take 2 tablets (650 mg total) by mouth every 4 (four) hours as needed. 100 tablet 2    albuterol (VENTOLIN HFA) 108 (90 Base) MCG/ACT inhaler Inhale 2 puffs into the lungs every 6 (six) hours as needed for wheezing or shortness of breath. 18 g 1    aspirin EC 81 MG tablet Take 1 tablet (81 mg total) by mouth daily. 60 tablet 2    Blood Pressure Monitoring (BLOOD PRESSURE MONITOR AUTOMAT) DEVI 1 Device by Does not apply route daily. Automatic blood pressure cuff regular/x-large size cuff. To monitor blood pressure regularly at home. ICD-10 code: O09.90. 1 each 0    Capsaicin 0.033 % CREA Apply 1 Film topically 3 (three) times daily as needed. 56.6 g 0    clindamycin (CLEOCIN) 300 MG capsule Take 300 mg by mouth 3 (three) times daily.      [employer] HEMORRHOIDAL & ANALGESIC 1 % ointment SMARTSIG:1 Application Rectally PRN      cyclobenzaprine (FLEXERIL) 10 MG tablet Take 1 tablet (10 mg total) by mouth 2 (two) times daily as needed for muscle spasms. 20 tablet 0    dibucaine (NUPERCAINAL) 1 % OINT Place 1 application rectally as needed for hemorrhoids. 28 g 1  docusate sodium (COLACE) 100 MG capsule Take 1 capsule (100 mg total) by mouth 2 (two) times daily. 60 capsule 2    ferrous sulfate (FERROUSUL) 325 (65 FE) MG tablet Take 1 tablet (325 mg total) by mouth every other day. 30 tablet 3    fluticasone (FLOVENT HFA) 110 MCG/ACT inhaler Inhale 2 puffs into the lungs in the morning and at bedtime. 12 g 12    pantoprazole (PROTONIX) 40 MG tablet Take 1 tablet (40 mg total) by mouth daily. 30  tablet 1    polyethylene glycol (MIRALAX) 17 g packet Take 17 g by mouth daily. Until bowel movement successful. 14 each 0    Prenatal 27-1 MG TABS Take 1 tablet by mouth daily. 30 tablet 12    promethazine (PHENERGAN) 25 MG suppository Place 1 suppository (25 mg total) rectally every 6 (six) hours as needed for nausea or vomiting. 12 each 2    promethazine (PHENERGAN) 25 MG tablet Take 1 tablet (25 mg total) by mouth every 6 (six) hours as needed for nausea or vomiting. 30 tablet 5    scopolamine (TRANSDERM-SCOP) 1 MG/3DAYS Place 1 patch (1.5 mg total) onto the skin every 3 (three) days. 10 patch 2    sucralfate (CARAFATE) 1 GM/10ML suspension Take 10 mLs (1 g total) by mouth 4 (four) times daily -  with meals and at bedtime. 420 mL 0    Scheduled:  ferrous sulfate  325 mg Oral Daily   ibuprofen  600 mg Oral Q6H   measles, mumps & rubella vaccine  0.5 mL Subcutaneous Once   prenatal multivitamin  1 tablet Oral Q1200   scopolamine  1 patch Transdermal Q72H   senna-docusate  2 tablet Oral Daily   Tdap  0.5 mL Intramuscular Once   Continuous:  lactated ringers 75 mL/hr at 01/18/21 1228   magnesium sulfate 2 g/hr (01/18/21 1238)   oxytocin 2.5 Units/hr (01/17/21 0234)   promethazine (PHENERGAN) injection (IM or IVPB) 25 mg (01/18/21 2841)   LKG:[id number], benzocaine-Menthol, coconut oil, witch hazel-glycerin **AND** dibucaine, diphenhydrAMINE, ondansetron **OR** ondansetron (ZOFRAN) IV, promethazine (PHENERGAN) injection (IM or IVPB), simethicone   ROS:                                                                                                                                       History obtained from the patient  General ROS: negative for - chills, fatigue, fever, night sweats, weight gain or weight loss Psychological ROS: negative for - behavioral disorder, hallucinations, memory difficulties, mood swings or suicidal ideation Ophthalmic ROS: negative for - blurry vision, double  vision, eye pain or loss of vision ENT ROS: negative for - epistaxis, nasal discharge, oral lesions, sore throat, tinnitus or vertigo Allergy and Immunology ROS: negative for - hives or itchy/watery eyes Hematological and Lymphatic ROS: negative for - bleeding problems, bruising or swollen lymph nodes Endocrine ROS: negative for - galactorrhea,  hair pattern changes, polydipsia/polyuria or temperature intolerance Respiratory ROS: negative for - cough, hemoptysis, shortness of breath or wheezing Cardiovascular ROS: negative for - chest pain, dyspnea on exertion, edema or irregular heartbeat Gastrointestinal ROS: negative for -  diarrhea, hematemesis, or stool incontinence + for Abdominal pain and Vomiting Genito-Urinary ROS: negative for - dysuria, hematuria, incontinence or urinary frequency/urgency Musculoskeletal ROS: negative for - joint swelling or muscular weakness + for lower back pain Neurological ROS: as noted in HPI Dermatological ROS: negative for rash and skin lesion changes   Blood pressure (!) 147/103, pulse (!) 55, temperature 98.1 F (36.7 C), temperature source Oral, resp. rate (!) 50, height 6\' 2"  (1.88 m), weight 118 kg, last menstrual period 04/08/2020, SpO2 100 %, unknown if currently breastfeeding.   General Examination:                                                                                                       Physical Exam  Constitutional: Appears well-developed and well-nourished.  Psych: Affect appropriate to situation Eyes: Normal external eye and conjunctiva. HENT: facial swelling, Normocephalic, no lesions, without obvious abnormality.   Musculoskeletal-no joint tenderness, deformity or swelling Cardiovascular: Normal rate and regular rhythm.  Respiratory: Effort normal, non-labored breathing saturations WNL GI: Soft.  No distension. There is no tenderness.  Skin: WDI  Neurological Examination Mental Status: Alert, oriented, thought content  appropriate.  Speech fluent without evidence of aphasia.  Able to follow commands without difficulty. Cranial Nerves: II: Visual fields grossly normal, PERRL  III,IV, VI: ptosis not present, extra-ocular motions intact bilaterally  V,VII: smile symmetric, facial light touch sensation normal bilaterally VIII: hearing normal bilaterally IX,X: Palate rises symmetrically XI: bilateral shoulder shrug XII: midline tongue extension Motor: Right : Upper extremity   5/5  Left:     Upper extremity   5/5  Lower extremity   5/5   Lower extremity   5/5 Tone and bulk:normal tone throughout; no atrophy noted Sensory: light touch intact throughout, bilaterally Deep Tendon Reflexes: 2+ and symmetric throughout Cerebellar: normal finger-to-nose, normal rapid alternating movements and normal heel-to-shin test Gait: deferred   Lab Results: Basic Metabolic Panel: Recent Labs  Lab 01/16/21 1033 01/17/21 1708 01/18/21 0855  NA 136 138 138  K 3.4* 3.5 3.5  CL 107 109 109  CO2 23 23 21*  GLUCOSE 89 95 111*  BUN 5* <5* 5*  CREATININE 0.63 0.76 0.70  CALCIUM 8.6* 8.0* 8.3*    CBC: Recent Labs  Lab 01/16/21 0809 01/17/21 0152 01/17/21 0749 01/17/21 1708 01/18/21 0855  WBC 9.0 12.2* 12.8* 11.0* 12.5*  NEUTROABS  --   --  10.7*  --  11.0*  HGB 10.8* 8.4* 7.1* 7.9* 7.4*  HCT 30.7* 23.1* 19.7* 21.7* 21.0*  MCV 93.3 90.9 90.8 89.3 92.1  PLT 198 183 151 151 161     Imaging: No images to review  Assessment: 30 y.o. female with PMHx of asthma, sickle cell trait, marijuana abuse, marijuana induced cyclic vomiting. Neurology consulted for seizure like episode. Of note patient was seen by this team in  2021 during her first pregnancy with similar presentation. EEG at that time was negative for any epileptiform discharges. EEG today is still pending. -On exam today patient was awake, alert, vomiting, with facial swelling as a reaction to IV Iron administration yesterday.  -Follow up exam by  attending is also nonfocal. Vomiting resolved.  -History of self-induced vomiting.  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   Recommendations: -EEG -Agree with zofran for vomiting -CMP/ UDS -Outpatient Neurology follow up -Please call the Neurology service if EEG is positive   Valentina Lucks, MSN, NP-C [facility] [phone]  I have seen and examined the patient. I have formulated the assessment and recommendations. 30 year old female with history of a seizure like spell felt most likely to have been a pseudoseizure. Had 2 more spells today. Exam is nonfocal. EEG is pending.  Electronically signed: Dr. Caryl Pina 01/18/2021, 10:29 AM

## 2021-01-18 NOTE — Progress Notes (Addendum)
POSTPARTUM PROGRESS NOTE  Subjective: Leslie Yates is a 30 y.o. G2P1102 PPD#2 SVD. Called to bedside due to seizure-like activity. On arrival, pt was actively shaking and had some foaming at the mouth. Thressa Sheller, CNM gave a sternal rub that stopped the seizure activity. Per nursing, pt did have LOC prior to arrival to bedside. Pt awoke after sternal rub and was oriented. Felt nauseous and began to force vomiting while sitting up in bed. Pt has a hx of similar symptoms in last pregnancy and was seen by neurology.   Objective: Blood pressure (!) 147/103, pulse (!) 55, temperature 98.1 F (36.7 C), temperature source Oral, resp. rate (!) 50, height 6\' 2"  (1.88 m), weight 118 kg, last menstrual period 04/08/2020, SpO2 100 %, unknown if currently breastfeeding.  Physical Exam:  General: Distressed but oriented after seizure activity Chest: Normal WOB Extremities: No calf swelling or tenderness  No edema  Recent Labs    01/17/21 1708 01/18/21 0855  HGB 7.9* 7.4*  HCT 21.7* 21.0*    Assessment/Plan: Leslie Yates is a 30 y.o. G2P1102 PPD#2 SVD.  -We have ordered a CBC, CMP and will start IVF with zofran IV. Will also consult neurology due to pt hx of seizure-like activity.   37, MD Faculty Practice, [facility] 01/18/2021 10:03 AM    Attestation of Supervision of Student:  I confirm that I have verified the information documented in the  resident's  snote and that I have also personally reperformed the history, physical exam and all medical decision making activities.  I have verified that all services and findings are accurately documented in this student's note; and I agree with management and plan as outlined in the documentation. I have also made any necessary editorial changes.  10:24 AM Consult with OB atteding, will start Mag due to 2 elevated blood pressures overnight, and OB attending would like to have neuro consult as well.  Spoke with  Neurology attending (Dr. 01/20/2021), and they will come see the patient as a consult.   Virgel Manifold DNP, CNM  01/18/21  10:26 AM

## 2021-01-18 NOTE — Procedures (Signed)
Patient Name: Leslie Yates  MRN: [id number]  Epilepsy Attending: Charlsie Quest  Referring Physician/Provider: Valentina Lucks, NP Date: 01/18/2021 Duration: 22.41 mins  Patient history: 29yo F with seizure like activity. EEG to evaluate for seizure.   Level of alertness: Awake, asleep  AEDs during EEG study: None  Technical aspects: This EEG study was done with scalp electrodes positioned according to the 10-20 International system of electrode placement. Electrical activity was acquired at a sampling rate of 500Hz  and reviewed with a high frequency filter of 70Hz  and a low frequency filter of 1Hz . EEG data were recorded continuously and digitally stored.   Description: The posterior dominant rhythm consists of 9-10 Hz activity of moderate voltage (25-35 uV) seen predominantly in posterior head regions, symmetric and reactive to eye opening and eye closing. Sleep was characterized by vertex waves, sleep spindles (12 to 14 Hz), maximal frontocentral region. Hyperventilation and photic stimulation were not performed.     IMPRESSION: This study is within normal limits. No seizures or epileptiform discharges were seen throughout the recording.  Brailynn Breth 

## 2021-01-18 NOTE — Progress Notes (Signed)
Upon assessment pt complains that she has requested an abd xray since delivery because she is concerned about her bleeding. Fundus U/1 w/ no bleeding. Vitals WNL. Dr. Alysia Penna notified, no orders at this time.

## 2021-01-18 NOTE — Progress Notes (Signed)
Post Partum Day 2 Subjective: Leslie Yates reports feeling very nauseated this morning. She endorses an improvement in her pain since yesterday. She no longer is feeling as dizzy when she ambulates. She has been able to urinate and pass flatus but has not yet had a BM. She endorses vomiting several times and being unable to eat. Per her partner in the room, she uses Phenergan at home and this typically works well for her nausea.   Nursing note stated patient left room and went out to her car last night. The patient had stated she just needed some fresh air. UDS came back positive for THC. Patient has reported THC use throughout the pregnancy.   Objective: Blood pressure (!) 147/87, pulse (!) 58, temperature 98.1 F (36.7 C), temperature source Oral, resp. rate 20, height 6\' 2"  (1.88 m), weight 118 kg, last menstrual period 04/08/2020, SpO2 100 %, unknown if currently breastfeeding.  Physical Exam:  General: fatigued and mild distress Lochia: appropriate Uterine Fundus: firm, very tender to palpation  Incision: N/A DVT Evaluation: No cords or calf tenderness. No significant calf/ankle edema.  Recent Labs    01/17/21 0749 01/17/21 1708  HGB 7.1* 7.9*  HCT 19.7* 21.7*    Assessment/Plan: POD #2  - VSS  - Patient meeting PP milestones  - Continue Phenergan as needed for nausea  - Continue ibuprofen for pain   PPH  - Received TXA, methergine, Pitocin, and Cytotec - Hgb 10.8 > 7.1 so 1 unit and Venofer were given yesterday, Hgb improved to 7.9 - Patient's symptoms improving, hemodynamically stable   Anemia  - PO iron supplementation to be continued    Marijuana use in pregnancy  - Social work consult ordered for today   Contraception: patient unsure if she wants the inpatient Nexplanon due to not feeling well currently  Feeding: initial plan was breastfeeding, has been bottle feeding due to feeling unwell   Disposition: Pending social work consult, patient will likely be  discharged later today or tomorrow.    LOS: 2 days   01/19/21 01/18/2021, 8:39 AM

## 2021-01-18 NOTE — Social Work (Signed)
CSW attempted to meet with MOB to complete assessment on two occassions, however MOB having medical procedures completed on both occassions.   CSW will follow-up with MOB prior discharge.   Analyah Mcconnon, MSW, LCSWA Clinical Social Work [facility] [phone] 

## 2021-01-18 NOTE — Progress Notes (Signed)
Patient states "I fell and hurt my knee."  When this RN entered room patient was sitting on side of bed with side rail down holding baby and rubbing her right knee.  Vital signs stable.  No broken skin areas, redness or swelling noted.  Patient moving legs without any ROM problems.  Dr. Donavan Foil notified, no orders @ this time.

## 2021-01-19 ENCOUNTER — Other Ambulatory Visit (HOSPITAL_COMMUNITY): Payer: Self-pay

## 2021-01-19 LAB — PROTEIN / CREATININE RATIO, URINE
Creatinine, Urine: 30.36 mg/dL
Total Protein, Urine: <6 mg/dL

## 2021-01-19 MED ORDER — ALUM & MAG HYDROXIDE-SIMETH 200-200-20 MG/5ML PO SUSP
15.0000 mL | Freq: Three times a day (TID) | ORAL | Status: DC
  Administered 2021-01-19 (×2): 15 mL via ORAL
  Filled 2021-01-19 (×2): qty 30

## 2021-01-19 MED ORDER — IBUPROFEN 600 MG PO TABS
600.0000 mg | ORAL_TABLET | Freq: Four times a day (QID) | ORAL | 1 refills | Status: DC
  Filled 2021-01-19: qty 30, 8d supply, fill #0
  Filled 2021-12-13: qty 30, 8d supply, fill #1

## 2021-01-19 NOTE — Lactation Note (Signed)
This note was copied from a baby's chart. Lactation Consultation Note  Patient Name: Leslie Yates [id number]'F Date: 01/19/2021   Age:30 hours  Per [name] RN Mother would only like to formula feed.  [name] [facility] 01/19/2021, 8:05 AM

## 2021-01-19 NOTE — Progress Notes (Signed)
At the request of the patient's spouse,Tyeisha, I spoke with the patient.  Ms Hittle had several physical complaints.  She stated, "I am bleeding heavily,I am not feeling right and I need an xray. "I did a fundal massage and patient had no bleeding. Her fundus was firm and 1 below the umbilicus.  She complained of legs hurting when she moves.  I encouraged her to move around the room.  The patient also complained of a sore chest, which is likely from the sternal rub she received when she appeared to be having a seizure.   Patient had no shortness of breath and was able to walk around the room slowly. She complained of feeling weak.  I explained that happens with magnesium and encouraged her that she should feel better after the magnesium sulfate is discontinued which is due to come down about noon.  Her doctor would be in to see her shortly.   Patient thanked me for coming in and said she is eager to go home.

## 2021-01-19 NOTE — Clinical Social Work Maternal (Signed)
CLINICAL SOCIAL WORK MATERNAL/CHILD NOTE  Patient Details  Name: Leslie Yates MRN: [id number] Date of Birth: 11/25/1990  Date:  01/19/2021  Clinical Social Worker Initiating Note:  Laurey Arrow Date/Time: Initiated:  01/19/21/1212     Child's Name:  Leslie Yates   Biological Parents:  Mother, Father Leslie Yates 11/01/1986)   Need for Interpreter:  None   Reason for Referral:  Current Substance Use/Substance Use During Pregnancy     Address:  [address] [place] [address]    Phone number:  [phone] (home)     Additional phone number:   Household Members/Support Persons (HM/SP):   Household Member/Support Person 1   HM/SP Name Relationship DOB or Age  HM/SP -[place] daughter 11/21/19  HM/SP -2        HM/SP -3        HM/SP -4        HM/SP -5        HM/SP -6        HM/SP -7        HM/SP -8          Natural Supports (not living in the home):  Immediate Family, Extended Family, Parent, Spouse/significant other, Friends   Professional Supports: Transport planner (MOB reports that she has a Transport planner with [facility] Seth Bake).)   Employment: Full-time   Type of Work: Agricultural consultant with [employer]   Education:  [place] arranged:    Museum/gallery curator Resources:  Medicaid   Other Resources:  Physicist, medical  , [place] Considerations Which May Impact Care:  None reported  Strengths:  Ability to meet basic needs  , Engineer, materials, Home prepared for child     Psychotropic Medications:         Pediatrician:    Solicitor area  Pediatrician List:   Engineer, petroleum Care)  [place]      Pediatrician Fax Number:    Risk Factors/Current Problems:  Substance Use  , Mental Health Concerns     Cognitive State:  Alert  , Insightful  , Linear Thinking  , Goal Oriented     Mood/Affect:  Interested  , Happy  ,  Relaxed     CSW Assessment: CSW met with MOB in room 117 to complete an assessment for THC use during pregnancy.  When CSW arrived, MOB's roommate Erin Sons) was present and was assisting MOB prepare for discharge.  With MOB's permission, CSW asked MOB's roommate to leave in order to assess MOB in private.  MOB was polite, forthcoming, and receptive to meeting with CSW.   CSW asked about MOB's SA hx and MOB openly shared her use of marijuana throughout her pregnancy.  MOB reported that she has consistently used marijuana recreationally  since high school.  MOB also shared that she smoked during her pregnancy to assist with increasing her appetite. MOB denied the use of all other illicit substances and she declined resources outpatient substance treatment.  CSW reviewed hospital's infant substance exposure policy and MOB was understanding.  MOB reported that she went through the same process with her oldest daughter in 2021.  MOB is aware that infant's UDS is positive for [facility] and CSW will make a report to [place]. MOB also shared she is familiar with CPS investigation process and she denied having  any questions or concerns.   CSW provided education regarding the baby blues period vs. perinatal mood disorders, discussed treatment and gave resources for mental health follow up if concerns arise.  CSW recommends self-evaluation during the postpartum time period and encouraged MOB to contact a medical professional if symptoms are noted at any time. MOB presented with insight and did not display any acute MH hx.  Per MOB, she has a hx of depression and is currently an established patient with Seth Bake with [facility].  CSW assessed for safety and MOB denied SI, HI, and DV.  MOB reports having all essential items to care for infant including a new car seat and bassinet.   CSW provided review of Sudden Infant Death Syndrome (SIDS) precautions.    CSW identifies no further need for  intervention and no barriers to discharge at this time.   CPS report made to [place].  CSW Plan/Description:  Psychosocial Support and Ongoing Assessment of Needs, Sudden Infant Death Syndrome (SIDS) Education, Perinatal Mood and Anxiety Disorder (PMADs) Education, Other Patient/Family Education, [place], Other Information/Referral to [employer], Child Protective Service Report  , CSW Will Continue to Monitor Umbilical Cord Tissue Drug Screen Results and Make Report if Warranted   Laurey Arrow, MSW, LCSW Clinical Social Work [phone]   Dimple Nanas, [place] 01/19/2021, 12:18 PM

## 2021-01-19 NOTE — Progress Notes (Signed)
POSTPARTUM PROGRESS NOTE  Post Partum Day 3  Subjective:  Leslie Yates is a 30 y.o. D2K0254 s/p SVD at [redacted]w[redacted]d.  She reports she is doing well. No acute events overnight. She denies any problems with ambulating, voiding or po intake. Denies nausea or vomiting.  Pain is well controlled.  Lochia is normal.  No more seizure like activity noted.  EEG was negative and pt has been cleared by neurology.  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  Discussed postpartum hemorrhage and how this may cause some abdominal tenderness.  She shows no sign of hemorrhage and ultrasound would give little information at this time.    Objective: Blood pressure (!) 124/54, pulse 69, temperature 98.1 F (36.7 C), temperature source Axillary, resp. rate 19, height 6\' 2"  (1.88 m), weight 118 kg, last menstrual period 04/08/2020, SpO2 100 %, unknown if currently breastfeeding.  Physical Exam:  General: alert, cooperative and no distress Chest: no respiratory distress Heart:regular rate, distal pulses intact Abdomen: soft, nontender,  Uterine Fundus: firm, appropriately tender DVT Evaluation: No calf swelling or tenderness Extremities: minimal edema Skin: warm, dry  Recent Labs    01/17/21 1708 01/18/21 0855  HGB 7.9* 7.4*  HCT 21.7* 21.0*    Assessment/Plan: Leslie Yates is a 30 y.o. 37 s/p SVD at [redacted]w[redacted]d   PPD#3 - Doing well  Magnesium sulfate discontinued, no recurrence of seizure like activity. Pt cleared by Neurology.  Contraception: pt desires progesterone IUD Feeding: breast Dispo: Plan for discharge after cleared by social work.   LOS: 3 days   [redacted]w[redacted]d, MD Faculty attending 01/19/2021, 10:54 AM

## 2021-01-19 NOTE — Progress Notes (Signed)
Patient finished with SWC and able to show babyscripts on her phone. Patient able to successfully submit BP reading at this time. Neurology contacted and follow up appointment to be coordinated this week with the patient.

## 2021-01-20 ENCOUNTER — Inpatient Hospital Stay (HOSPITAL_COMMUNITY)
Admission: AD | Admit: 2021-01-20 | Discharge: 2021-01-20 | Disposition: A | Discharge status: Home / Self Care | Payer: Medicaid Other | Attending: Obstetrics and Gynecology | Admitting: Obstetrics and Gynecology

## 2021-01-20 ENCOUNTER — Encounter (HOSPITAL_COMMUNITY): Payer: Self-pay | Admitting: Obstetrics and Gynecology

## 2021-01-20 ENCOUNTER — Other Ambulatory Visit: Payer: Self-pay

## 2021-01-20 DIAGNOSIS — I10 Essential (primary) hypertension: Secondary | ICD-10-CM | POA: Diagnosis not present

## 2021-01-20 DIAGNOSIS — R531 Weakness: Secondary | ICD-10-CM | POA: Insufficient documentation

## 2021-01-20 DIAGNOSIS — R112 Nausea with vomiting, unspecified: Secondary | ICD-10-CM | POA: Diagnosis not present

## 2021-01-20 DIAGNOSIS — R109 Unspecified abdominal pain: Secondary | ICD-10-CM | POA: Diagnosis present

## 2021-01-20 DIAGNOSIS — R040 Epistaxis: Secondary | ICD-10-CM | POA: Diagnosis not present

## 2021-01-20 DIAGNOSIS — Z880 Allergy status to penicillin: Secondary | ICD-10-CM | POA: Insufficient documentation

## 2021-01-20 DIAGNOSIS — F17210 Nicotine dependence, cigarettes, uncomplicated: Secondary | ICD-10-CM | POA: Insufficient documentation

## 2021-01-20 DIAGNOSIS — Z79899 Other long term (current) drug therapy: Secondary | ICD-10-CM | POA: Insufficient documentation

## 2021-01-20 DIAGNOSIS — Z7951 Long term (current) use of inhaled steroids: Secondary | ICD-10-CM | POA: Insufficient documentation

## 2021-01-20 DIAGNOSIS — O99893 Other specified diseases and conditions complicating puerperium: Secondary | ICD-10-CM | POA: Diagnosis not present

## 2021-01-20 DIAGNOSIS — Z791 Long term (current) use of non-steroidal anti-inflammatories (NSAID): Secondary | ICD-10-CM | POA: Insufficient documentation

## 2021-01-20 DIAGNOSIS — O159 Eclampsia, unspecified as to time period: Secondary | ICD-10-CM | POA: Diagnosis not present

## 2021-01-20 DIAGNOSIS — R1084 Generalized abdominal pain: Secondary | ICD-10-CM | POA: Diagnosis not present

## 2021-01-20 DIAGNOSIS — R569 Unspecified convulsions: Secondary | ICD-10-CM | POA: Diagnosis not present

## 2021-01-20 MED ORDER — SODIUM CHLORIDE 0.9 % IV SOLN: Freq: Once | INTRAVENOUS | Status: AC

## 2021-01-20 MED ORDER — ONDANSETRON HCL 4 MG/2ML IJ SOLN
4.0000 mg | Freq: Once | INTRAMUSCULAR | Status: AC
  Administered 2021-01-20: 4 mg via INTRAVENOUS
  Filled 2021-01-20: qty 2

## 2021-01-20 MED ORDER — LABETALOL HCL 5 MG/ML IV SOLN: 80.0000 mg | INTRAVENOUS | Status: DC | PRN

## 2021-01-20 MED ORDER — HYDRALAZINE HCL 20 MG/ML IJ SOLN: 10.0000 mg | INTRAMUSCULAR | Status: DC | PRN

## 2021-01-20 MED ORDER — LABETALOL HCL 5 MG/ML IV SOLN: 20.0000 mg | INTRAVENOUS | Status: DC | PRN

## 2021-01-20 MED ORDER — PROMETHAZINE HCL 12.5 MG PO TABS: 25.0000 mg | ORAL_TABLET | Freq: Four times a day (QID) | ORAL | 0 refills | Status: DC | PRN

## 2021-01-20 MED ORDER — FAMOTIDINE IN NACL 20-0.9 MG/50ML-% IV SOLN
20.0000 mg | Freq: Once | INTRAVENOUS | Status: AC
  Administered 2021-01-20: 20 mg via INTRAVENOUS
  Filled 2021-01-20: qty 50

## 2021-01-20 MED ORDER — LABETALOL HCL 5 MG/ML IV SOLN: 40.0000 mg | INTRAVENOUS | Status: DC | PRN

## 2021-01-20 NOTE — MAU Provider Note (Signed)
History     CSN: [id number]  Arrival date and time: 01/20/21 0848   Event Date/Time   First Provider Initiated Contact with Patient 01/20/21 [phone]      Chief Complaint  Patient presents with   Abdominal Pain   Emesis   Weakness   Ms. Leslie RISHEL is a 30 y.o. year old G13P1102 female at 4 days PP (SVD on 01/16/21) from who presents to MAU via EMS reporting nosebleed, N/V, mid abdominal pain, and weakness that started at 0300. EMS reported the nosebleed was reportedly on-going from 0300 until they arrived there. She has been dry heaving with occasional emesis. EMS had her take her Phenergan tablet prior to leaving her house, but they "suspect she threw it up immediately after getting on the truck." She was then given Zofran 4 mg IV on the EMS truck. Per EMS, she continued to dry heave. She was discharged yesterday (01/19/21) from [facility]. She later admitted to eating [employer] last night at 2100. She reports vomiting a large amount at 0300, but only dry heaving afterwards. She sticks her fingers in the back of her throat to induce vomiting, because "I feel like the vomit is stuck in the back of my throat;" no emesis since arriving to MAU. Her spouse is present and contributing to the history taking.   OB History     Gravida  2   Para  2   Term  1   Preterm  1   AB  0   Living  2      SAB  0   IAB  0   Ectopic  0   Multiple  0   Live Births  2           Past Medical History:  Diagnosis Date   Asthma    Cyclical vomiting with nausea 10/27/2018   ED visits for vomiting and diarrhea since 2009-13 At least 4-5 references witnessing patient inducing vomiting by sticking fingers down throat Has had difficulty obtaining meds through the years GI consult made but never went   Elevated liver enzymes 11/19/2019   Gestational hypertension 11/17/2019   IOL @ 37 weeks   Lump of breast 06/12/2019   Obesity    Ovarian cyst    Sickle cell trait (HCC)     Past Surgical History:   Procedure Laterality Date   INCISION AND DRAINAGE ABSCESS      Family History  Problem Relation Age of Onset   Diabetes Mother    Hypertension Mother    Diabetes Father    Hypertension Father     Social History   Tobacco Use   Smoking status: Every Day    Packs/day: 0.25    Years: 11.00    Pack years: 2.75    Types: Cigarettes   Smokeless tobacco: Never  Vaping Use   Vaping Use: Never used  Substance Use Topics   Alcohol use: Not Currently    Comment: occ   Drug use: Yes    Types: Marijuana    Comment: last used 12/31/2020    Allergies:  Allergies  Allergen Reactions   Penicillins Rash    Childhood rash, Tolerated cefazolin (2021) Has patient had a PCN reaction causing immediate rash, facial/tongue/throat swelling, SOB or lightheadedness with hypotension: no Has patient had a PCN reaction causing severe rash involving mucus membranes or skin necrosis:no Has patient had a PCN reaction that required hospitalization no Has patient had a PCN reaction occurring within the  last 10 years: no If all of the above answers are "NO", then may proceed with Cephalosporin use.    Medications Prior to Admission  Medication Sig Dispense Refill Last Dose   acetaminophen (TYLENOL) 325 MG tablet Take 2 tablets (650 mg total) by mouth every 4 (four) hours as needed. 100 tablet 2    albuterol (VENTOLIN HFA) 108 (90 Base) MCG/ACT inhaler Inhale 2 puffs into the lungs every 6 (six) hours as needed for wheezing or shortness of breath. 18 g 1    Blood Pressure Monitoring (BLOOD PRESSURE MONITOR AUTOMAT) DEVI 1 Device by Does not apply route daily. Automatic blood pressure cuff regular/x-large size cuff. To monitor blood pressure regularly at home. ICD-10 code: O09.90. 1 each 0    clindamycin (CLEOCIN) 300 MG capsule Take 300 mg by mouth 3 (three) times daily.      [employer] HEMORRHOIDAL & ANALGESIC 1 % ointment SMARTSIG:1 Application Rectally PRN      docusate sodium (COLACE) 100 MG capsule Take 1  capsule (100 mg total) by mouth 2 (two) times daily. 60 capsule 2    ferrous sulfate (FERROUSUL) 325 (65 FE) MG tablet Take 1 tablet (325 mg total) by mouth every other day. 30 tablet 3    fluticasone (FLOVENT HFA) 110 MCG/ACT inhaler Inhale 2 puffs into the lungs in the morning and at bedtime. 12 g 12    ibuprofen (ADVIL) 600 MG tablet Take 1 tablet (600 mg total) by mouth every 6 (six) hours. 30 tablet 1    Prenatal 27-1 MG TABS Take 1 tablet by mouth daily. 30 tablet 12     Review of Systems  Constitutional: Negative.   HENT: Negative.    Eyes: Negative.   Respiratory: Negative.    Cardiovascular: Negative.   Gastrointestinal:  Positive for abdominal pain, nausea and vomiting.  Endocrine: Negative.   Genitourinary: Negative.   Musculoskeletal: Negative.   Skin: Negative.   Allergic/Immunologic: Negative.   Neurological: Negative.   Hematological: Negative.   Psychiatric/Behavioral: Negative.     Physical Exam   Blood pressure 131/67, pulse (!) 51, temperature 98.6 F (37 C), temperature source Oral, resp. rate (!) 22, SpO2 100 %, unknown if currently breastfeeding.  Physical Exam Vitals and nursing note reviewed.  Constitutional:      Appearance: Normal appearance. She is obese.  Cardiovascular:     Rate and Rhythm: Normal rate.     Pulses: Normal pulses.  Pulmonary:     Effort: Pulmonary effort is normal.  Abdominal:     Palpations: Abdomen is soft.     Tenderness: There is no abdominal tenderness.  Genitourinary:    Comments: deferred Musculoskeletal:        General: Normal range of motion.  Skin:    General: Skin is warm and dry.  Neurological:     Mental Status: She is alert and oriented to person, place, and time.  Psychiatric:        Mood and Affect: Affect is tearful.        Behavior: Behavior normal.        Thought Content: Thought content normal.        Judgment: Judgment normal.    MAU Course  Procedures  MDM 0.9% NS 500 ml bolus then 150  ml/hr Zofran 4 mg IVPB Pepcid 20 mg IVPB  Assessment and Plan  Intractable vomiting with nausea, unspecified vomiting type  - Rx for Phenergan 25 mg every 6 hours prn N/V - Information provided on N/V in  adult  - Discharge patient  - Keep scheduled appt with [facility] - Patient verbalized an understanding of the plan of care and agrees.   [name], CNM 01/20/2021, 8:58 AM

## 2021-01-20 NOTE — MAU Note (Addendum)
...  Leslie Yates is a 30 y.o. at Unknown here in MAU reporting: nausea and vomiting that began at 0300 this morning. Patient states she had [employer] for dinner last night around 2200. Patient states she was vomiting so often she began to see blood around 0400. She states she began to feel weak shortly after, after "throwing up so much." She states she also had a nose bleed around the same time she began vomiting. EMS VS on truck were 160/100, HR in the 60s. Patient received 4 mg of Zofran on the truck.   Upon arriving to MAU, patient dry heaving and gagging, but not vomiting.   Onset of complaint: 0300 this morning Pain score: 7/10 mid abdominal pain, patient states behind her belly button  Vitals:   01/20/21 0850 01/20/21 0903  BP: 131/67 128/67  Pulse: (!) 51 61  Resp: (!) 22   Temp: 98.6 F (37 C)   SpO2: 100% 100%

## 2021-01-20 NOTE — Discharge Instructions (Signed)
Please avoid eating at [facility] or any other high fat restaurants

## 2021-01-22 DIAGNOSIS — E876 Hypokalemia: Secondary | ICD-10-CM | POA: Diagnosis not present

## 2021-01-23 DIAGNOSIS — E876 Hypokalemia: Secondary | ICD-10-CM | POA: Diagnosis not present

## 2021-01-23 DIAGNOSIS — Z8759 Personal history of other complications of pregnancy, childbirth and the puerperium: Secondary | ICD-10-CM

## 2021-01-23 HISTORY — DX: Personal history of other complications of pregnancy, childbirth and the puerperium: Z87.59

## 2021-01-24 DIAGNOSIS — Z8759 Personal history of other complications of pregnancy, childbirth and the puerperium: Secondary | ICD-10-CM | POA: Diagnosis not present

## 2021-01-24 DIAGNOSIS — E876 Hypokalemia: Secondary | ICD-10-CM | POA: Diagnosis not present

## 2021-01-24 DIAGNOSIS — R112 Nausea with vomiting, unspecified: Secondary | ICD-10-CM | POA: Diagnosis not present

## 2021-01-25 ENCOUNTER — Telehealth: Payer: Self-pay

## 2021-01-25 DIAGNOSIS — O139 Gestational [pregnancy-induced] hypertension without significant proteinuria, unspecified trimester: Secondary | ICD-10-CM | POA: Diagnosis not present

## 2021-01-25 NOTE — Telephone Encounter (Signed)
Transition Care Management Unsuccessful Follow-up Telephone Call  Date of discharge and from where:  01/24/2021-[facility]   Attempts:  1st Attempt  Reason for unsuccessful TCM follow-up call:  Left voice message

## 2021-01-26 ENCOUNTER — Encounter: Payer: Self-pay | Admitting: Neurology

## 2021-01-26 NOTE — Telephone Encounter (Signed)
Transition Care Management Follow-up Telephone Call Date of discharge and from where: 01/24/2021-[facility]  How have you been since you were released from the hospital? Patient stated she is doing a lot better. Any questions or concerns? No  Items Reviewed: Did the pt receive and understand the discharge instructions provided? Yes  Medications obtained and verified? Yes  Other? No  Any new allergies since your discharge? No  Dietary orders reviewed? N/A Do you have support at home? Yes   Home Care and Equipment/Supplies: Were home health services ordered? not applicable If so, what is the name of the agency? N/A  Has the agency set up a time to come to the patient's home? not applicable Were any new equipment or medical supplies ordered?  No What is the name of the medical supply agency? N/A Were you able to get the supplies/equipment? not applicable Do you have any questions related to the use of the equipment or supplies? No  Functional Questionnaire: (I = Independent and D = Dependent) ADLs: I  Bathing/Dressing- I  Meal Prep- I  Eating- I  Maintaining continence- I  Transferring/Ambulation- I  Managing Meds- I  Follow up appointments reviewed:  PCP Hospital f/u appt confirmed? No  Specialist Hospital f/u appt confirmed? Yes  Scheduled to see [facility] on 01/27/2021 @ 9:50 AM. Are transportation arrangements needed? No  If their condition worsens, is the pt aware to call PCP or go to the Emergency Dept.? Yes Was the patient provided with contact information for the PCP's office or ED? Yes Was to pt encouraged to call back with questions or concerns? Yes

## 2021-01-27 ENCOUNTER — Other Ambulatory Visit: Payer: Self-pay

## 2021-01-27 ENCOUNTER — Ambulatory Visit (INDEPENDENT_AMBULATORY_CARE_PROVIDER_SITE_OTHER): Payer: Medicaid Other

## 2021-01-27 VITALS — BP 118/75 | HR 79

## 2021-01-27 DIAGNOSIS — Z0130 Encounter for examination of blood pressure without abnormal findings: Secondary | ICD-10-CM

## 2021-01-27 NOTE — Patient Instructions (Signed)
Our fax number is [phone]

## 2021-01-27 NOTE — Progress Notes (Signed)
Blood Pressure Check  Pt delivered 01/16/21 at [facility]. Was admitted to [facility] for eclampsia during PP period. History significant for eclampsia and HELLP during prior pregnancy. Pt was discharged AMA on 01/24/21, pt reported need to leave due to childcare. Pt declined staying for 1 more night for BP monitoring, this was recommended because BP still elevated on 30 mg Procardia. Pt discharged home on 01/24/21 with 60 mg Procardia to take daily.   BP today in office is 118/75. Pt denies any headache or vision changes today. Reports minor headache yesterday that resolved without any intervention. Mild pitting edema to BLL on assessment. Pt reports continued fatigue. I explained this is likely due to low hemoglobin; hgb 8.4 on 01/23/21. Pt would not like repeat iron infusion due to reaction during infusion in hospital on 01/17/21 following delivery.   Reviewed with Anyanwu, MD who states pt may return for PP appt on 02/17/21. Recommends pt take BP weekly or with any symptoms and document in Babyscripts. Pt given BP cuff today (tested in office for accuracy) and pt was able to show RN how to log BP in Babyscripts. Pt to call the office with any BP 140/90 or higher. MD also recommends pt take iron supplement every other day. Per chart review, pt has active prescription. Reviewed with pt that she may take iron supplement every other day, preferably with orange juice.   Pt reports desire to breastfeed. Has not been breastfeeding due to concern if medications are safe for breastfeeding. Reviewed with pt these medications are safe for breastfeeding. Encouraged pt to put baby to breast over the weekend and to pump if baby unable to latch. Lactation appt scheduled for next Tuesday in office. Pt states she does not have enough formula at home. WIC appt next Thursday. Pt given information for [facility] [facility] to inquire about formula. Pt escorted to food market for diapers and wipes.  Fleet Contras RN 01/27/21

## 2021-01-28 ENCOUNTER — Telehealth (HOSPITAL_COMMUNITY): Payer: Self-pay

## 2021-01-28 NOTE — Progress Notes (Signed)
Patient was assessed and managed by nursing staff during this encounter. I have reviewed the chart and agree with the documentation and plan.   Teandra Harlan, MD 01/28/2021 11:56 AM 

## 2021-01-28 NOTE — Telephone Encounter (Signed)
No answer. Left message to return nurse call.  Marcelino Duster [facility] 01/28/2021,1003

## 2021-02-01 ENCOUNTER — Encounter: Payer: Self-pay | Admitting: Neurology

## 2021-02-01 ENCOUNTER — Ambulatory Visit: Payer: Medicaid Other | Admitting: Neurology

## 2021-02-01 ENCOUNTER — Other Ambulatory Visit: Payer: Self-pay

## 2021-02-01 VITALS — BP 120/82 | HR 112 | Resp 20 | Ht 72.0 in | Wt 240.0 lb

## 2021-02-01 DIAGNOSIS — R519 Headache, unspecified: Secondary | ICD-10-CM | POA: Diagnosis not present

## 2021-02-01 DIAGNOSIS — Z8249 Family history of ischemic heart disease and other diseases of the circulatory system: Secondary | ICD-10-CM

## 2021-02-01 DIAGNOSIS — F445 Conversion disorder with seizures or convulsions: Secondary | ICD-10-CM | POA: Diagnosis not present

## 2021-02-01 MED ORDER — SUMATRIPTAN SUCCINATE 50 MG PO TABS: ORAL_TABLET | ORAL | 11 refills | Status: DC

## 2021-02-01 MED ORDER — NORTRIPTYLINE HCL 25 MG PO CAPS: ORAL_CAPSULE | ORAL | 11 refills | Status: DC

## 2021-02-01 NOTE — Patient Instructions (Signed)
Schedule MRI brain without contrast, MRA head without contrast  2. Start nortriptyline 25mg : Take 1 capsule every night for 1 week, then increase to 2 capsules every night  3. Start slowly weaning off the Tylenol to 2-3 times a week, otherwise headaches will not improve. Take the sumatriptan as needed instead at onset of headache  4. As per [place] driving laws no driving after a seizure until 6 months seizure-free  5. Follow-up in 4 months, call for any changes
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
## 2021-02-06 ENCOUNTER — Ambulatory Visit: Payer: Self-pay

## 2021-02-06 ENCOUNTER — Encounter (HOSPITAL_COMMUNITY): Payer: Self-pay | Admitting: Obstetrics and Gynecology

## 2021-02-06 ENCOUNTER — Other Ambulatory Visit: Payer: Self-pay | Admitting: Neurology

## 2021-02-06 ENCOUNTER — Other Ambulatory Visit: Payer: Self-pay | Admitting: *Deleted

## 2021-02-06 NOTE — Patient Instructions (Signed)
Rilley J Lekas ,   The Medicaid Managed Care Team is available to provide assistance to you with your healthcare needs at no cost and as a benefit of your Medicaid Health plan. I'm sorry I was unable to reach you today for our scheduled appointment. Our care guide will call you to reschedule our telephone appointment. Please call me at the number below. I am available to be of assistance to you regarding your healthcare needs. .   Thank you,   Halston Fairclough RN, CCM, CDCES [place]  [facility] Care Management Coordinator - Managed Medicaid High Risk [phone]   

## 2021-02-06 NOTE — Patient Outreach (Signed)
Care Coordination  02/06/2021  [name] 11/10/1990 [id number]  02/06/2021 Name: Leslie Yates MRN: [id number] DOB: 1991/04/12  Referred by: [name], NP Reason for referral : High Risk Managed Medicaid (RNCM Unsuccessful Initial Telephone Outreach )   An unsuccessful telephone outreach was attempted today. The patient was referred to the case management team for assistance with care management and care coordination.    Follow Up Plan: A HIPAA compliant phone message was left for the patient providing contact information and requesting a return call. and The Managed Medicaid care management team will reach out to the patient again over the next 7 days.    Cranford Mon RN, CCM, [facility]  [facility] Care Management Coordinator - Managed [place] High Risk [phone]

## 2021-02-07 ENCOUNTER — Telehealth: Payer: Self-pay | Admitting: Primary Care

## 2021-02-07 NOTE — Telephone Encounter (Signed)
..   Medicaid Managed Care   Unsuccessful Outreach Note  02/07/2021 Name: Leslie Yates MRN: [id number] DOB: Jan 03, 1991  Referred by: [name], NP Reason for referral : High Risk Managed Medicaid (Attempted to reach this patient today to reschedule her phone visit that she missed with the MM RNCM. It sounded like someone picked up the phone but they never said anything or if they did I was not able to hear them. I will reach out again tomorrow.)   An unsuccessful telephone outreach was attempted today. The patient was referred to the case management team for assistance with care management and care coordination.   Follow Up Plan: The care management team will reach out to the patient again over the next 7 days.   Weston Settle Care Guide, High Risk Medicaid Managed Care Embedded Care Coordination [facility]  [facility]

## 2021-02-09 ENCOUNTER — Telehealth: Payer: Self-pay | Admitting: Neurology

## 2021-02-09 NOTE — Telephone Encounter (Signed)
Patient stopped by the office today and said she had a severe headache since yesterday. She has taken two sumatriptan since then and some Tylenol.  Today, it is better but not as severe. She'd like some recommendations since she's almost taken her allotted doses for the week.  [facility] [address]

## 2021-02-09 NOTE — Telephone Encounter (Signed)
Pt called and informed that If she was still having a lingering  headache, she could have gotten a headache cocktail (if she had a driver) or Toradol shot.  It appears that she was prescribed 50mg  sumatriptan.  Next time she has a headache, she can take 100mg  at earliest onset and may repeat 100mg  again in 2 hours (maximum 200mg  in 24 hours).  pt stated that is will try the sumatriptan 1st if that doesn't help she will call the office about a headache cocktail

## 2021-02-10 ENCOUNTER — Ambulatory Visit: Payer: Medicaid Other

## 2021-02-10 ENCOUNTER — Other Ambulatory Visit: Payer: Self-pay

## 2021-02-10 DIAGNOSIS — G43909 Migraine, unspecified, not intractable, without status migrainosus: Secondary | ICD-10-CM

## 2021-02-10 MED ORDER — KETOROLAC TROMETHAMINE 60 MG/2ML IM SOLN
60.0000 mg | Freq: Once | INTRAMUSCULAR | Status: AC
  Administered 2021-02-10: 60 mg via INTRAMUSCULAR

## 2021-02-10 MED ORDER — DIPHENHYDRAMINE HCL 50 MG/ML IJ SOLN
50.0000 mg | Freq: Once | INTRAMUSCULAR | Status: AC
  Administered 2021-02-10: 25 mg via INTRAMUSCULAR

## 2021-02-10 MED ORDER — METOCLOPRAMIDE HCL 5 MG/ML IJ SOLN
10.0000 mg | Freq: Once | INTRAMUSCULAR | Status: AC
  Administered 2021-02-10: 10 mg via INTRAMUSCULAR

## 2021-02-10 NOTE — Patient Instructions (Signed)
Visit Information  Ms. Traeger was given information about Medicaid Managed Care team care coordination services as a part of their [employer] Medicaid benefit. [name] verbally consented to engagement with the [facility] Managed Care team.   If you are experiencing a medical emergency, please call 911 or report to your local emergency department or urgent care.   If you have a non-emergency medical problem during routine business hours, please contact your provider's office and ask to speak with a nurse.   For questions related to your Healthy [facility] health plan, please call: [phone] or visit the homepage here: [email or web address]  If you would like to schedule transportation through your Healthy [facility] plan, please call the following number at least 2 days in advance of your appointment: [phone]  Call the [facility] Crisis Line at [phone], at any time, 24 hours a day, 7 days a week. If you are in danger or need immediate medical attention call 911.  If you would like help to quit smoking, call 1-800-QUIT-NOW (208 064 5661) OR Espaol: 1-855-Djelo-Ya (4-035-248-1859) o para ms informacin haga clic aqu or Text READY to 093-112 to register via text  Ms. Doubleday - following are the goals we discussed in your visit today:   Goals Addressed   None     Please see education materials related to HTN provided as print materials.   The patient verbalized understanding of instructions provided today and declined a print copy of patient instruction materials.   The  Patient                                              has been provided with contact information for the Managed Medicaid care management team and has been advised to call with any health related questions or concerns.   Artelia Laroche, Pharm.D., Managed Medicaid Pharmacist [phone]   Following is a copy of your plan of care:  Patient Care Plan:  Medication Management     Problem Identified: Health Promotion or Disease Self-Management (General Plan of Care)      Goal: Medication Management   Note:   Current Barriers:  Unable to independently afford treatment regimen Does not contact provider office for questions/concerns   Pharmacist Clinical Goal(s):  Over the next 180 days, patient will contact provider office for questions/concerns as evidenced notation of same in electronic health record through collaboration with PharmD and provider.    Interventions: Inter-disciplinary care team collaboration (see longitudinal plan of care) Comprehensive medication review performed; medication list updated in electronic medical record   Health Maintenance:  Patient Goals/Self-Care Activities Over the next 180 days, patient will:  - collaborate with provider on medication access solutions  Follow Up Plan: The patient has been provided with contact information for the care management team and has been advised to call with any health related questions or concerns.      Task: Mutually Develop and Malen Gauze Achievement of Patient Goals   Note:   Care Management Activities:    - verbalization of feelings encouraged    Notes:

## 2021-02-10 NOTE — Patient Outreach (Signed)
Medicaid Managed Care    Pharmacy Note  02/10/2021 Name: Leslie Yates MRN: [id number] DOB: [birth year]  Leslie Yates is a 30 y.o. year old female who is a primary care patient of Leslie Sessions, NP. [facility] Managed Care Coordination team was consulted for assistance with disease management and care coordination needs.    Engaged with patient Engaged with patient by telephone for initial visit in response to referral for case management and/or care coordination services.  Ms. Benninger was given information about Managed Medicaid Care Coordination team services today. [name] agreed to services and verbal consent obtained.   Objective:  Lab Results  Component Value Date   CREATININE 0.70 01/18/2021   CREATININE 0.76 01/17/2021   CREATININE 0.63 01/16/2021    Lab Results  Component Value Date   HGBA1C 5.0 06/12/2019    No results found for: CHOL, TRIG, HDL, CHOLHDL, VLDL, LDLCALC, LDLDIRECT  Other: (TSH, CBC, Vit D, etc.)  Clinical ASCVD: No  The ASCVD Risk score Leslie George DC Jr., et al., 2013) failed to calculate for the following reasons:   The 2013 ASCVD risk score is only valid for ages 21 to 65    Other: (CHADS2VASc if Afib, PHQ9 if depression, MMRC or CAT for COPD, ACT, DEXA)  BP Readings from Last 3 Encounters:  02/01/21 120/82  01/27/21 118/75  01/20/21 132/74    Assessment/Interventions: Review of patient past medical history, allergies, medications, health status, including review of consultants reports, laboratory and other test data, was performed as part of comprehensive evaluation and provision of chronic care management services.   HTN BP Readings from Last 3 Encounters:  02/01/21 120/82  01/27/21 118/75  01/20/21 132/74   Nifedipine 60mg /24 hours QD Plan: At goal,  patient stable/ symptoms controlled   Migraines -States none of her meds are helping Sumatriptan 50mg  Notriptyline 25mg  take 2HS (Never picked up) August 2022: Patient is  seeing Neuro today, will ask for change of therapy  Meds: -Patient's chief complaint today was asking about what meds for and how she could take them while breastfeeding Plan: Counseled patient on meds. Used Epocrates to check Lactation risk of meds she told me she was currently taking   SDOH (Social Determinants of Health) assessments and interventions performed:  SDOH Interventions    Flowsheet Row Most Recent Value  SDOH Interventions   Transportation Interventions Other (Comment)  [Will get meds delivered]        Care Plan  Allergies  Allergen Reactions   Penicillins Rash    Childhood rash, Tolerated cefazolin (2021) Has patient had a PCN reaction causing immediate rash, facial/tongue/throat swelling, SOB or lightheadedness with hypotension: no Has patient had a PCN reaction causing severe rash involving mucus membranes or skin necrosis:no Has patient had a PCN reaction that required hospitalization no Has patient had a PCN reaction occurring within the last 10 years: no If all of the above answers are "NO", then may proceed with Cephalosporin use.    Medications Reviewed Today     Reviewed by , [facility] (Pharmacist) on 02/10/21 at 1306  Med List Status: <None>   Medication Order Taking? Sig Documenting Provider Last Dose Status Informant  acetaminophen (TYLENOL) 325 MG tablet [name] No Take 2 tablets (650 mg total) by mouth every 4 (four) hours as needed.  Patient not taking: Reported on 02/10/2021   02/12/21, CNM Not Taking Consider Medication Status and Discontinue   albuterol (VENTOLIN HFA) 108 (90 Base) MCG/ACT inhaler  [id number]  Inhale 2 puffs into the lungs every 6 (six) hours as needed for wheezing or shortness of breath. Leslie SessionsEdwards, Michelle P, NP  Active   Blood Pressure Monitoring (BLOOD PRESSURE MONITOR AUTOMAT) DEVI [id number]  1 Device by Does not apply route daily. Automatic blood pressure cuff regular/x-large size cuff. To monitor  blood pressure regularly at home. ICD-10 code: O09.90. Leslie PhenixArnold, James G, MD  Active   clindamycin (CLEOCIN) 300 MG capsule [id number]  Take 300 mg by mouth 3 (three) times daily. [provider]  Active   [employer] HEMORRHOIDAL & ANALGESIC 1 % ointment [id number]  SMARTSIG:1 Application Rectally PRN [provider]  Active   docusate sodium (COLACE) 100 MG capsule [id number]  Take 1 capsule (100 mg total) by mouth 2 (two) times daily. Leslie Yates, Matthew M, MD  Active   famotidine (PEPCID) 20 MG tablet [id number] No Take 20 mg by mouth 2 (two) times daily.  Patient not taking: Reported on 02/10/2021   [provider] Not Taking Active   ferrous sulfate (FERROUSUL) 325 (65 FE) MG tablet [id number] Yes Take 1 tablet (325 mg total) by mouth every other day. Leslie Yates, Jacob J, DO Taking Active   fluticasone (FLOVENT HFA) 110 MCG/ACT inhaler [id number] No Inhale 2 puffs into the lungs in the morning and at bedtime.  Patient not taking: Reported on 02/10/2021   Leslie SessionsEdwards, Michelle P, NP Not Taking Active   ibuprofen (ADVIL) 600 MG tablet [id number] No Take 1 tablet (600 mg total) by mouth every 6 (six) hours.  Patient not taking: Reported on 02/10/2021   Leslie FillersBass, Lawrence A, MD Not Taking Consider Medication Status and Discontinue   metoCLOPramide (REGLAN) 10 MG tablet [id number] No Take 10 mg by mouth 4 (four) times daily.  Patient not taking: No sig reported   [provider] Not Taking Consider Medication Status and Discontinue   NIFEdipine (PROCARDIA XL/NIFEDICAL XL) 60 MG 24 hr tablet [id number] Yes Take 60 mg by mouth daily. [provider] Taking Active   nortriptyline (PAMELOR) 25 MG capsule [id number] No Take 1 tablet every night for 1 week, then increase to 2 tablets every night  Patient not taking: Reported on 02/10/2021   Leslie Yates, Karen M, MD Not Taking Active   ondansetron (ZOFRAN-ODT) 4 MG disintegrating tablet [id number] No Take 4 mg by mouth every 8 (eight) hours as  needed.  Patient not taking: No sig reported   [provider] Not Taking Consider Medication Status and Discontinue   Prenatal 27-1 MG TABS [id number] Yes Take 1 tablet by mouth daily. Raelyn Moraawson, Rolitta, CNM Taking Active   promethazine (PHENERGAN) 12.5 MG suppository [id number] No Place rectally.  Patient not taking: Reported on 02/10/2021   [provider] Not Taking Active   promethazine (PHENERGAN) 12.5 MG tablet [id number] Yes Take 2 tablets (25 mg total) by mouth every 6 (six) hours as needed for nausea or vomiting. Raelyn Moraawson, Rolitta, CNM Taking Active   senna-docusate (SENOKOT-S) 8.6-50 MG tablet [id number] No Take by mouth.  Patient not taking: No sig reported   [provider] Not Taking Consider Medication Status and Discontinue   SUMAtriptan (IMITREX) 50 MG tablet [id number] Yes Take 1 tablet at onset of migraine. Do not take more than 2-3 a week Leslie Yates, Karen M, MD Taking Active             Patient Active Problem List   Diagnosis Date Noted   H/O eclampsia 01/23/2021   IUP (intrauterine pregnancy), incidental 01/16/2021   Fetal cardiac anomaly  affecting pregnancy, antepartum 10/20/2020   Hx of HELLP syndrome, currently pregnant, second trimester 09/16/2020   Hx of preterm delivery, currently pregnant, second trimester 09/16/2020   Supervision of high risk pregnancy, antepartum 05/23/2020   Seizure (HCC) 11/16/2019   Carrier of disease 07/01/2019   Sickle cell trait (HCC)    Obesity in pregnancy, antepartum 06/12/2019   Tobacco use 10/28/2018   Asthma 10/27/2018   Complex cyst of right ovary 05/10/2017   Nausea and vomiting during pregnancy 11/09/2016    Conditions to be addressed/monitored: HTN and Migraines  Care Plan : Medication Management  Updates made by [name], RPH since 02/10/2021 12:00 AM     Problem: Health Promotion or Disease Self-Management (General Plan of Care)      Goal: Medication Management   Note:   Current  Barriers:  Unable to independently afford treatment regimen Does not contact provider office for questions/concerns   Pharmacist Clinical Goal(s):  Over the next 180 days, patient will contact provider office for questions/concerns as evidenced notation of same in electronic health record through collaboration with PharmD and provider.    Interventions: Inter-disciplinary care team collaboration (see longitudinal plan of care) Comprehensive medication review performed; medication list updated in electronic medical record   Health Maintenance:  Patient Goals/Self-Care Activities Over the next 180 days, patient will:  - collaborate with provider on medication access solutions  Follow Up Plan: The patient has been provided with contact information for the care management team and has been advised to call with any health related questions or concerns.      Task: Mutually Develop and Malen Gauze Achievement of Patient Goals   Note:   Care Management Activities:    - verbalization of feelings encouraged    Notes:        Medication Assistance:  Patient having trouble keeping track of refills and getting transportation to pick them up Plan: Verbal consent obtained for [facility] enhanced pharmacy services (medication synchronization, adherence packaging, delivery coordination). A medication sync plan was created to allow patient to get all medications delivered once every 30 to 90 days per patient preference. Patient understands they have freedom to choose pharmacy and clinical pharmacist will coordinate care between all prescribers and [facility]. Medication Name                        (please note if Rx is PRN) Prescriber                                                                  (list Provider Name & Phone Number)                                  Timing    Refill Timing Last Fill Date & DS       (if last fill/DS unavailable, list pt.'s quantity on hand) Anticipated next  due date     BB B L EM BT     Albuterol Puffer STINSON,JACOB JEHIEL [phone]        Hold  Famotidine 20mg  BID STINSON,JACOB JEHIEL [phone]       01/24/21 for 30 days 02/23/21  Ferrous  Sulfate 325mg  every other day STINSON,JACOB JEHIEL [phone]       11/23/20 for 90 days 02/23/21  Nifedipine 60mg /24H QD STINSON,JACOB JEHIEL (336) 04/25/21       01/24/21 for 30 days 02/23/21  Notriptyline 25mg  take 1HS for 1 week then 2HS 03/26/21, MD [phone]       02/10/21 for 30 days 03/12/21  Prenatal vitamin 27-1mg  STINSON,JACOB JEHIEL [phone]       01/10/21 for 30 days (Non-compliant 02/23/21  Promethazine 12.5mg  tablet take 2PRN STINSON,JACOB JEHIEL (336) 03/14/21       01/24/21 for 30 days 02/23/21  Promethazine 12.5mg  Suppository PRN STINSON,JACOB JEHIEL [phone]       01/24/21 for 30 days 02/23/21  Sumatriptan 50mg  PRN (This may be changed in next week, she seeing Neuro Monday) 03/26/21, MD [phone]       02/01/21 for 30 days 03/03/21    Follow up: Agree  Plan: The patient has been provided with contact information for the care management team and has been advised to call with any health related questions or concerns.   Leslie Clines, Pharm.D., Managed Medicaid Pharmacist - [phone]

## 2021-02-13 ENCOUNTER — Other Ambulatory Visit: Payer: Self-pay | Admitting: Neurology

## 2021-02-13 MED ORDER — SUMATRIPTAN SUCCINATE 100 MG PO TABS: 100.0000 mg | ORAL_TABLET | Freq: Once | ORAL | 11 refills | Status: DC | PRN

## 2021-02-13 MED ORDER — NORTRIPTYLINE HCL 25 MG PO CAPS: ORAL_CAPSULE | ORAL | 11 refills | Status: DC

## 2021-02-15 ENCOUNTER — Other Ambulatory Visit: Payer: Self-pay

## 2021-02-15 ENCOUNTER — Ambulatory Visit
Admission: RE | Admit: 2021-02-15 | Discharge: 2021-02-15 | Disposition: A | Discharge status: Home / Self Care | Payer: Medicaid Other | Source: Ambulatory Visit | Attending: Neurology | Admitting: Neurology

## 2021-02-15 DIAGNOSIS — R569 Unspecified convulsions: Secondary | ICD-10-CM | POA: Diagnosis not present

## 2021-02-15 DIAGNOSIS — Z8249 Family history of ischemic heart disease and other diseases of the circulatory system: Secondary | ICD-10-CM | POA: Diagnosis not present

## 2021-02-15 DIAGNOSIS — R519 Headache, unspecified: Secondary | ICD-10-CM | POA: Diagnosis not present

## 2021-02-15 DIAGNOSIS — R9082 White matter disease, unspecified: Secondary | ICD-10-CM | POA: Diagnosis not present

## 2021-02-16 ENCOUNTER — Telehealth: Payer: Self-pay | Admitting: *Deleted

## 2021-02-16 DIAGNOSIS — I10 Essential (primary) hypertension: Secondary | ICD-10-CM

## 2021-02-16 NOTE — Telephone Encounter (Signed)
Received a voice message this pm from [facility] that they are calling regarding mutual patient. They stated she is a new patient to them and are requesting a prescription for her nifedipine 60mg .  Shatora Weatherbee,RN

## 2021-02-17 ENCOUNTER — Ambulatory Visit (INDEPENDENT_AMBULATORY_CARE_PROVIDER_SITE_OTHER): Payer: Medicaid Other | Admitting: Obstetrics and Gynecology

## 2021-02-17 ENCOUNTER — Other Ambulatory Visit: Payer: Self-pay

## 2021-02-17 ENCOUNTER — Encounter: Payer: Self-pay | Admitting: Obstetrics and Gynecology

## 2021-02-17 NOTE — Progress Notes (Addendum)
Post Partum Visit Note  Leslie Yates is a 30 y.o. [phone] female who presents for a postpartum visit. She is 4 weeks postpartum following a normal spontaneous vaginal delivery.  I have fully reviewed the prenatal and intrapartum course. The delivery was at [redacted]w[redacted]d gestational weeks.  Anesthesia: epidural. Postpartum course has been normal. Baby is doing well. Baby is feeding by bottle - Gentle Gerber . Bleeding staining only. Bowel function is normal. Bladder function is normal. Patient is sexually active. Contraception method is IUD. Postpartum depression screening: positive.   The pregnancy intention screening data noted above was reviewed. Potential methods of contraception were discussed. The patient elected to proceed with IUD; however, she had recent unprotected intercourse and will need to wait 7-10 days   Edinburgh Postnatal Depression Scale - 02/17/21 1057       Edinburgh Postnatal Depression Scale:  In the Past 7 Days   I have been able to laugh and see the funny side of things. 1    I have looked forward with enjoyment to things. 1    I have blamed myself unnecessarily when things went wrong. 1    I have been anxious or worried for no good reason. 2    I have felt scared or panicky for no good reason. 0    Things have been getting on top of me. 1    I have been so unhappy that I have had difficulty sleeping. 1    I have felt sad or miserable. 1    I have been so unhappy that I have been crying. 1    The thought of harming myself has occurred to me. 0    Edinburgh Postnatal Depression Scale Total 9           Reviewed depression scale, pt is aware and is already scheduling follow up with Asher Muir at behavorial health  Health Maintenance Due  Topic Date Due   Pneumococcal Vaccine 12-41 Years old (1 - PCV) Never done   COVID-19 Vaccine (3 - Pfizer risk series) 09/13/2020   INFLUENZA VACCINE  01/23/2021    The following portions of the patient's history were reviewed and  updated as appropriate: allergies, current medications, past family history, past medical history, past social history, past surgical history, and problem list.   Current Outpatient Medications  Medication Instructions   acetaminophen (TYLENOL) 650 mg, Oral, Every 4 hours PRN   albuterol (VENTOLIN HFA) 108 (90 Base) MCG/ACT inhaler 2 puffs, Inhalation, Every 6 hours PRN   Blood Pressure Monitoring (BLOOD PRESSURE MONITOR AUTOMAT) DEVI 1 Device, Does not apply, Daily, Automatic blood pressure cuff regular/x-large size cuff. To monitor blood pressure regularly at home. ICD-10 code: O09.90.   clindamycin (CLEOCIN) 300 mg, Oral, 3 times daily   [employer] HEMORRHOIDAL & ANALGESIC 1 % ointment SMARTSIG:1 Application Rectally PRN   docusate sodium (COLACE) 100 mg, Oral, 2 times daily   famotidine (PEPCID) 20 mg, 2 times daily   ferrous sulfate (FERROUSUL) 325 mg, Oral, Every other day   fluticasone (FLOVENT HFA) 110 MCG/ACT inhaler 2 puffs, Inhalation, 2 times daily   ibuprofen (ADVIL) 600 mg, Oral, Every 6 hours   metoCLOPramide (REGLAN) 10 mg, 4 times daily   NIFEdipine (PROCARDIA XL/NIFEDICAL XL) 60 mg, Oral, Daily   nortriptyline (PAMELOR) 25 MG capsule Take 2 capsules every night   ondansetron (ZOFRAN-ODT) 4 mg, Every 8 hours PRN   Prenatal 27-1 MG TABS 1 tablet, Oral, Daily   promethazine (PHENERGAN) 12.5 MG  suppository Place rectally.   promethazine (PHENERGAN) 25 mg, Oral, Every 6 hours PRN   senna-docusate (SENOKOT-S) 8.6-50 MG tablet Take by mouth.   SUMAtriptan (IMITREX) 100 mg, Oral, Once PRN, May repeat in 2 hours if headache persists or recurs. Do not take more than 3 a week    Review of Systems Pertinent items are noted in HPI.  Objective:  BP 126/75   Pulse (!) 56   Wt 241 lb (109.3 kg)   LMP 04/08/2020 (Exact Date)   Breastfeeding Unknown   BMI 32.69 kg/m    General:  alert, cooperative, appears stated age, and no distress   Breasts:  not indicated  Lungs: clear to  auscultation bilaterally  Heart:  regular rate and rhythm  Abdomen: soft, non-tender; bowel sounds normal; no masses,  no organomegaly   Wound N/A  GU exam:  normal       Assessment:    Encounter for postpartum  normal postpartum exam.   Plan:   Essential components of care per ACOG recommendations:  1.  Mood and well being: Patient with positive depression screening today. Reviewed local resources for support.  - Patient tobacco use? Yes. Patient desires to quit? Yes.Discussed reduction and cessation  - hx of drug use? No.    2. Infant care and feeding:  -Patient currently breastmilk feeding? No.  -Social determinants of health (SDOH) reviewed in EPIC. No concerns  3. Sexuality, contraception and birth spacing - Patient does not want a pregnancy in the next year.  Desired family size is 3 children.  - Reviewed forms of contraception in tiered fashion. Patient desired IUD today.   - Discussed birth spacing of 18 months  4. Sleep and fatigue -Encouraged family/partner/community support of 4 hrs of uninterrupted sleep to help with mood and fatigue  5. Physical Recovery  - Discussed patients delivery and complications. She describes her labor as good. - Patient had a Vaginal, no problems at delivery. Patient had a  left labial  laceration. Perineal healing reviewed. Patient expressed understanding - Patient has urinary incontinence? No. - Patient is safe to resume physical and sexual activity  6.  Health Maintenance - HM due items addressed Yes, pt desires to start smoking cessation, she is interested in the patch and possibly oral medication (buproprion) - Last pap smear  Diagnosis  Date Value Ref Range Status  12/24/2019   Final   - Negative for intraepithelial lesion or malignancy (NILM)   Pap smear not done at today's visit. Advise repap 2023 -Breast Cancer screening indicated? No.   7. Chronic Disease/Pregnancy Condition follow up:  headaches .  Pt has been seen and  is followed by neurology   IUD placement in 7-10 days.  Advised to not have unprotected intercourse until IUD is placed.   [name], MD [facility] [employer], [facility]

## 2021-02-20 ENCOUNTER — Other Ambulatory Visit: Payer: Self-pay | Admitting: *Deleted

## 2021-02-20 ENCOUNTER — Ambulatory Visit: Payer: Self-pay

## 2021-02-20 NOTE — Patient Instructions (Signed)
Visit Information  Leslie Yates was given information about Medicaid Managed Care team care coordination services as a part of their Healthy Blue Medicaid benefit. Leslie Yates verbally consented to engagement with the [facility] Managed Care team.   If you are experiencing a medical emergency, please call 911 or report to your local emergency department or urgent care.   If you have a non-emergency medical problem during routine business hours, please contact your provider's office and ask to speak with a nurse.   For questions related to your Healthy [facility] health plan, please call: [phone] or visit the homepage here: [email or web address]  If you would like to schedule transportation through your Healthy [facility] plan, please call the following number at least 2 days in advance of your appointment: [phone]  Call the [facility] Crisis Line at [phone], at any time, 24 hours a day, 7 days a week. If you are in danger or need immediate medical attention call 911.  If you would like help to quit smoking, call 1-800-QUIT-NOW (217 084 1846) OR Espaol: 1-855-Djelo-Ya (4-360-677-0340) o para ms informacin haga clic aqu or Text READY to 352-481 to register via text  Leslie Yates - following are the goals we discussed in your visit today:   Goals Addressed             This Visit's Progress    Begin and Stick with Counseling-Depression       Timeframe:  Long-Range Goal Priority:  High Start Date:     02/20/21                        Expected End Date:    ongoing                   Follow Up Date 03/21/21    - keep 90 percent of counseling appointments - schedule counseling appointment    Why is this important?   Beating depression may take some time.  If you don't feel better right away, don't give up on your treatment plan.    Notes:      Find Help in My Community       Timeframe:  Long-Range Goal Priority:   High Start Date:  02/20/21                           Expected End Date:     ongoing                  Follow Up Date 03/21/21    - follow-up on any referrals for help I am given - think ahead to make sure my need does not become an emergency    Why is this important?   Knowing how and where to find help for yourself or family in your neighborhood and community is an important skill.  You will want to take some steps to learn how.    Notes:      Learn More About My Health       Timeframe:  Long-Range Goal Priority:  High Start Date:   02/20/21                          Expected End Date:   ongoing                     Follow Up Date 03/21/21    -  tell my story and reason for my visit - make a list of questions - ask questions - repeat what I heard to make sure I understand - bring a list of my medicines to the visit - speak up when I don't understand  -investigate smoking cessation benefits offered by my health plan   Why is this important?   The best way to learn about your health and care is by talking to the doctor and nurse.  They will answer your questions and give you information in the way that you like best.    Notes:      Make and Keep All Appointments       Timeframe:  Long-Range Goal Priority:  High Start Date:      02/20/21                       Expected End Date:   ongoing                    Follow Up Date 03/21/21    - arrange a ride through an agency 1 week before appointment - ask family or friend for a ride - call to cancel if needed - keep a calendar with appointment dates    Why is this important?   Part of staying healthy is seeing the doctor for follow-up care.  If you forget your appointments, there are some things you can do to stay on track.    Notes:         Please see education materials related to asthma, smoking cessation and post partum depression provided as print materials.   The patient verbalized understanding of instructions  provided today and agreed to receive a mailed copy of patient instruction and/or educational materials.  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      Long-Range Goal: Development of Plan Of Care For Chronic Disease Management and Care Coordination Needs To Assist With Meeting Treatment Goals   Start Date: 02/20/2021  Expected End Date: 06/20/2021  Priority: High  Note:   Current Barriers:  Care Coordination needs related to Financial constraints related to limited income, Limited social support, Transportation, Housing barriers, Social Isolation, Lack of essential utilities - difficulty paying bills, and Lacks knowledge of community resource: related to health insurance benefits  Chronic Disease Management support and education needs related to Asthma and Post Partum Depression and smoking cessation Lacks caregiver support.  Corporate treasurer.  Transportation barriers Difficulty obtaining medications Knowledge deficit related to managed Medicaid Healthy Blue plan benefits  RNCM Clinical Goal(s):  Patient will verbalize understanding of plan for management of asthma and post partum depression and smoking cessation take all medications exactly as prescribed and will call provider for medication related questions demonstrate understanding of rationale for each prescribed medication especially asthma maintenance medication attend all scheduled medical appointments: with ob/gyn and behavioral health counselor demonstrate improved adherence to prescribed treatment plan for asthma  and post partum depression as evidenced by adherence to prescribed medication regimen contacting provider for new or worsened symptoms or questions and attending  virtual counseling sessions continue to work with Medical illustrator to address care management and care coordination needs related to post partum depression and asthma and smoking cessation work with pharmacist to address Financial constraints related to limited income, Limited social support, Transportation, Housing barriers, Geneticist, molecular, and Lack of essential utilities - difficulty paying household bills, related to post partum depression and asthma and smoking cessation work with Child psychotherapist to Sports coach constraints related to limited income, Limited social support, English as a second language teacher, Housing barriers, Geneticist, molecular, and Lack of essential utilities - and difficulty paying household bills related to the management of post partum depression and asthma through collaboration with Medical illustrator, provider, and care team.   Interventions: Inter-disciplinary care team collaboration (see longitudinal plan of care) Evaluation of current treatment plan related to  self management and patient's adherence to plan as established by provider Mailed health education articles and [facility] Care Management spiral bound calendar with health education   Interdisciplinary Collaboration:  (Status: New goal.) Collaborated with BSW to initiate plan of care to address needs related to Financial constraints related to limited income, Limited social support, Transportation, Housing barriers, Geneticist, molecular, Limited access to caregiver, and Lacks knowledge of community resource: related to health plan benefits  in patient with  post partum depression and asthma  and smoking cessation Collaboration with [name], [name], NP regarding development and update of comprehensive plan of care as evidenced by provider attestation and  co-signature Inter-disciplinary care team collaboration (see longitudinal plan of care)  Asthma: (Status: New goal.) Provided patient with basic written and verbal asthma education on self care/management/and exacerbation prevention; mailed booklet entitled "Breathing Free" - A Treatment Guide for People with Asthma" Advised patient to track and manage asthma triggers;  Provided instruction about proper use of medications used for management of asthma including inhalers; both maintenance and rescue Advised patient to self assesses asthma action plan zone and make appointment with provider if in the yellow zone for 48 hours without improvement; Assessed social determinant of health barriers;   Smoking Cessation: (Status: New goal.) Reviewed smoking history:  tobacco abuse of 17 years; currently smoking 1 ppd Previous quit attempts, unsuccessful 0; successful using 0  Denies smoking within 30 minutes of waking up Reports triggers to smoke include: stress, anxiety Reports motivation to quit smoking includes: living to see her children grow up On a scale of 1-10, reports MOTIVATION to quit is 9 On a scale of 1-10, reports CONFIDENCE in quitting is 5  Evaluation of current treatment plan reviewed; Advised patient to discuss smoking cessation options with provider; Provided contact information for [place] Quit Line (1-800-QUIT-NOW);  Provided patient with printed smoking cessation educational materials; Discussed plans with patient for ongoing care management follow up and provided patient with direct contact information for care management team; Provided health plan benefits related to smoking cessation  Patient Goals/Self-Care Activities: Patient will self administer medications as prescribed Patient will attend all scheduled provider appointments Patient will call pharmacy for medication refills Patient will investigate smoking cessation benefits offered by health plan Patient will continue  to perform ADL's independently Patient will continue to perform IADL's independently Patient will call provider office for new concerns or questions Patient will work with BSW and pharmacist to address care coordination needs and will continue to work with the clinical team to address health care and disease management related needs.

## 2021-02-20 NOTE — Patient Outreach (Signed)
Medicaid Managed Care   Nurse Care Manager Note  02/20/2021 Name:  Leslie Yates MRN:  [id number] DOB:  04/13/1991  Leslie Yates is an 30 y.o. year old female who is a primary patient of Leslie Sessions, NP.  The [facility] Managed Care Coordination team was consulted for assistance with:    Smoking Cesssation Postpartum Depression Asthma  Leslie Yates was given information about [employer] team services today. Leslie Yates Patient agreed to services and verbal consent obtained.  Engaged with patient by telephone for initial visit in response to provider referral for case management and/or care coordination services.   Assessments/Interventions:  Review of past medical history, allergies, medications, health status, including review of consultants reports, laboratory and other test data, was performed as part of comprehensive evaluation and provision of chronic care management services.  SDOH (Social Determinants of Health) assessments and interventions performed:   Care Plan  Allergies  Allergen Reactions   Penicillins Rash    Childhood rash, Tolerated cefazolin (2021) Has patient had a PCN reaction causing immediate rash, facial/tongue/throat swelling, SOB or lightheadedness with hypotension: no Has patient had a PCN reaction causing severe rash involving mucus membranes or skin necrosis:no Has patient had a PCN reaction that required hospitalization no Has patient had a PCN reaction occurring within the last 10 years: no If all of the above answers are "NO", then may proceed with Cephalosporin use.    Medications Reviewed Today     Reviewed by Leslie Richard, RN (Registered Nurse) on 02/20/21 at 1729  Med List Status: <None>   Medication Order Taking? Sig Documenting Provider Last Dose Status Informant  acetaminophen (TYLENOL) 325 MG tablet [id number] Yes Take 2 tablets (650 mg total) by mouth every 4 (four) hours as needed. Leslie Yates Taking Active   albuterol (VENTOLIN HFA) 108 (90 Base) MCG/ACT inhaler [id number] Yes Inhale 2 puffs into the lungs every 6 (six) hours as needed for wheezing or shortness of breath. Leslie Sessions, NP Taking Active   Blood Pressure Monitoring (BLOOD PRESSURE MONITOR AUTOMAT) DEVI [id number] Yes 1 Device by Does not apply route daily. Automatic blood pressure cuff regular/x-large size cuff. To monitor blood pressure regularly at home. ICD-10 code: O09.90. Leslie Phenix, MD Taking Active   clindamycin (CLEOCIN) 300 MG capsule [id number] Yes Take 300 mg by mouth 3 (three) times daily. [provider] Taking Active   [facility] HEMORRHO[facility]AL [facility] ANALGESIC 1 % ointment [id number] Yes  [provider] Taking Active   docusate sodium (COLACE) 100 MG capsule [id number] Yes Take 1 capsule (100 mg total) by mouth 2 (two) times daily. Leslie Maples, MD Taking Active   famotidine (PEPCID) 20 MG tablet [id number] No Take 20 mg by mouth 2 (two) times daily.  Patient not taking: No sig reported   [provider] Not Taking Active   ferrous sulfate (FERROUSUL) 325 (65 FE) MG tablet [id number] Yes Take 1 tablet (325 mg total) by mouth every other day. Leslie Heritage, DO Taking Active   fluticasone (FLOVENT HFA) 110 MCG/ACT inhaler [id number] Yes Inhale 2 puffs into the lungs in the morning and at bedtime. Leslie Sessions, NP Taking Active            Med Note [name] Feb 20, 2021  5:29 PM) Counseled on using twice daily not prn as she is currently doing  ibuprofen (ADVIL) 600 MG tablet [id number] No Take 1 tablet (600  mg total) by mouth every 6 (six) hours.  Patient not taking: No sig reported   Leslie Fillers, MD Not Taking Active   metoCLOPramide (REGLAN) 10 MG tablet [id number] Yes Take 10 mg by mouth 4 (four) times daily. [provider] Taking Active   NIFEdipine (PROCARDIA XL/NIFEDICAL XL) 60 MG 24 hr tablet [id number] Yes Take 60 mg by mouth daily.  [provider] Taking Active   nortriptyline (PAMELOR) 25 MG capsule [id number] No Take 2 capsules every night  Patient not taking: No sig reported   [name], MD Not Taking Active            Med Note [name], [name] Feb 20, 2021  5:20 PM) States she never pick up from pharmacy  ondansetron (ZOFRAN-ODT) 4 MG disintegrating tablet [id number] No Take 4 mg by mouth every 8 (eight) hours as needed.  Patient not taking: No sig reported   [provider] Not Taking Active   Prenatal 27-1 MG TABS [id number] Yes Take 1 tablet by mouth daily. Leslie Yates, Yates Taking Active   promethazine (PHENERGAN) 12.5 MG suppository [id number] Yes Place rectally. [provider] Taking Active   promethazine (PHENERGAN) 12.5 MG tablet [id number] Yes Take 2 tablets (25 mg total) by mouth every 6 (six) hours as needed for nausea or vomiting. Leslie Yates, Yates Taking Active   senna-docusate (SENOKOT-S) 8.6-50 MG tablet [id number] Yes Take by mouth. [provider] Taking Active   SUMAtriptan (IMITREX) 100 MG tablet [id number] No Take 1 tablet (100 mg total) by mouth once as needed for up to 1 dose for migraine. May repeat in 2 hours if headache persists or recurs. Do not take more than 3 a week  Patient not taking: No sig reported   [name], MD Not Taking Active             Patient Active Problem List   Diagnosis Date Noted   Encounter for postpartum visit 02/17/2021   H/O eclampsia 01/23/2021   IUP (intrauterine pregnancy), incidental 01/16/2021   Fetal cardiac anomaly affecting pregnancy, antepartum 10/20/2020   Hx of HELLP syndrome, currently pregnant, second trimester 09/16/2020   Hx of preterm delivery, currently pregnant, second trimester 09/16/2020   Supervision of high risk pregnancy, antepartum 05/23/2020   Seizure (HCC) 11/16/2019   Carrier of disease 07/01/2019   Sickle cell trait (HCC)    Obesity in pregnancy, antepartum 06/12/2019    Tobacco use 10/28/2018   Asthma 10/27/2018   Complex cyst of right ovary 05/10/2017   Nausea and vomiting during pregnancy 11/09/2016    Conditions to be addressed/monitored per PCP order:   asthma, postpartum depression and smoking cessation  Care Plan : RN Care Manager Plan Of Care  Updates made by Leslie Richard, RN since 02/20/2021 12:00 AM     Problem: Chronic Disease Management and Care Coordination 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      Long-Range Goal: Development of Plan Of Care For Chronic Disease Management and Care Coordination Needs To Assist With Meeting Treatment Goals   Start Date: 02/20/2021  Expected End Date: 06/20/2021  Priority: High  Note:   Current Barriers:  Care Coordination needs related to Financial constraints related to limited income, Limited social support, Transportation, Housing barriers, Social Isolation, Lack of essential utilities - difficulty paying bills, and Lacks knowledge of community resource: related to health insurance benefits  Chronic Disease Management support and education needs related to Asthma and Post  Partum Depression and smoking cessation Lacks caregiver support.  Corporate treasurer.  Transportation barriers Difficulty obtaining medications Knowledge deficit related to managed Medicaid Healthy Blue plan benefits  RNCM Clinical Goal(s):  Patient will verbalize understanding of plan for management of asthma and post partum depression and smoking cessation take all medications exactly as prescribed and will call provider for medication related questions demonstrate understanding of rationale for each prescribed medication especially asthma maintenance medication attend all scheduled medical appointments: with ob/gyn and behavioral health counselor demonstrate improved adherence to prescribed treatment plan for asthma and post partum depression as evidenced by adherence to prescribed medication regimen contacting provider  for new or worsened symptoms or questions and attending virtual counseling Yates continue to work with RN Care Manager to address care management and care coordination needs related to post partum depression and asthma and smoking cessation work with pharmacist to address Financial constraints related to limited income, Limited social support, Transportation, Housing barriers, Geneticist, molecular, and Lack of essential utilities - difficulty paying household bills, related to post partum depression and asthma and smoking cessation work with Child psychotherapist to Sports coach constraints related to limited income, Limited social support, English as a second language teacher, Housing barriers, Geneticist, molecular, and Lack of essential utilities - and difficulty paying household bills related to the management of post partum depression and asthma through collaboration with Medical illustrator, provider, and care team.   Interventions: Inter-disciplinary care team collaboration (see longitudinal plan of care) Evaluation of current treatment plan related to  self management and patient's adherence to plan as established by provider Mailed health education articles and [facility] Care Management spiral bound calendar with health education   Interdisciplinary Collaboration:  (Status: New goal.) Collaborated with BSW to initiate plan of care to address needs related to Financial constraints related to limited income, Limited social support, Transportation, Housing barriers, Geneticist, molecular, Limited access to caregiver, and Lacks knowledge of community resource: related to health plan benefits  in patient with  post partum depression and asthma  and smoking cessation Collaboration with [name], [name], NP regarding development and update of comprehensive plan of care as evidenced by provider attestation and co-signature Inter-disciplinary care team collaboration (see longitudinal plan of care)  Asthma: (Status: New  goal.) Provided patient with basic written and verbal asthma education on self care/management/and exacerbation prevention; mailed booklet entitled "Breathing Free" - A Treatment Guide for People with Asthma" Advised patient to track and manage asthma triggers;  Provided instruction about proper use of medications used for management of asthma including inhalers; both maintenance and rescue Advised patient to self assesses asthma action plan zone and make appointment with provider if in the yellow zone for 48 hours without improvement; Assessed social determinant of health barriers;   Smoking Cessation: (Status: New goal.) Reviewed smoking history:  tobacco abuse of 17 years; currently smoking 1 ppd Previous quit attempts, unsuccessful 0; successful using 0  Denies smoking within 30 minutes of waking up Reports triggers to smoke include: stress, anxiety Reports motivation to quit smoking includes: living to see her children grow up On a scale of 1-10, reports MOTIVATION to quit is 9 On a scale of 1-10, reports CONFIDENCE in quitting is 5  Evaluation of current treatment plan reviewed; Advised patient to discuss smoking cessation options with provider; Provided contact information for [place] Quit Line (1-800-QUIT-NOW);  Provided patient with printed smoking cessation educational materials; Discussed plans with patient for ongoing care management follow up and provided patient with direct contact information for care  management team; Provided health plan benefits related to smoking cessation  Patient Goals/Self-Care Activities: Patient will self administer medications as prescribed Patient will attend all scheduled provider appointments Patient will call pharmacy for medication refills Patient will investigate smoking cessation benefits offered by health plan Patient will continue to perform ADL's independently Patient will continue to perform IADL's independently Patient will call provider  office for new concerns or questions Patient will work with BSW and pharmacist to address care coordination needs and will continue to work with the clinical team to address health care and disease management related needs.         Follow Up:  Patient agrees to Care Plan and Follow-up.  Plan: The Managed Medicaid care management team will reach out to the patient again over the next 30 days.  Date/time of next scheduled RN care management/care coordination outreach:     03/21/21 at 4:00 pm  Cranford Mon RN, CCM, CDCES [place]  [facility] Care Management Coordinator - Managed [place] High Risk [phone]

## 2021-02-20 NOTE — BH Specialist Note (Signed)
Pt did not arrive to video visit and did not answer the phone; Left HIPPA-compliant message to call back Zael Shuman from [facility] at  [phone] (Chalsey Leeth's office).  ?; left MyChart message for patient.  ? ?

## 2021-02-21 ENCOUNTER — Telehealth: Payer: Self-pay | Admitting: *Deleted

## 2021-02-21 ENCOUNTER — Telehealth: Payer: Self-pay | Admitting: Neurology

## 2021-02-21 MED ORDER — NIFEDIPINE ER OSMOTIC RELEASE 60 MG PO TB24: 60.0000 mg | ORAL_TABLET | Freq: Every day | ORAL | 2 refills | Status: DC

## 2021-02-21 NOTE — Telephone Encounter (Signed)
Left VM. Will reply on MyChart

## 2021-02-21 NOTE — Telephone Encounter (Signed)
I called Kinzie and informed her we had gotten request for refill from [facility]. She verified she is using that pharmacy now.  She states she has some Procardia left for about 2 weeks. I informed her Dr. Donavan Foil ordered Rx for 58months which we will send RX today.  I also informed her he wants her to hold the rx on the day of her IUD placement 03/09/21 so  we can see what her blood pressure is without it. She voices understanding. Tymar Polyak,RN

## 2021-02-21 NOTE — Telephone Encounter (Signed)
opened in error

## 2021-02-22 ENCOUNTER — Ambulatory Visit: Payer: Medicaid Other | Admitting: Clinical

## 2021-02-22 DIAGNOSIS — Z91199 Patient's noncompliance with other medical treatment and regimen due to unspecified reason: Secondary | ICD-10-CM

## 2021-02-22 DIAGNOSIS — Z5329 Procedure and treatment not carried out because of patient's decision for other reasons: Secondary | ICD-10-CM

## 2021-02-28 ENCOUNTER — Other Ambulatory Visit: Payer: Self-pay | Admitting: Neurology

## 2021-02-28 DIAGNOSIS — R519 Headache, unspecified: Secondary | ICD-10-CM

## 2021-02-28 DIAGNOSIS — Z8249 Family history of ischemic heart disease and other diseases of the circulatory system: Secondary | ICD-10-CM

## 2021-02-28 DIAGNOSIS — F445 Conversion disorder with seizures or convulsions: Secondary | ICD-10-CM

## 2021-03-02 ENCOUNTER — Ambulatory Visit: Payer: Medicaid Other | Admitting: Neurology

## 2021-03-02 ENCOUNTER — Other Ambulatory Visit: Payer: Self-pay

## 2021-03-02 NOTE — Patient Instructions (Signed)
Visit Information  Leslie Yates was given information about Medicaid Managed Care team care coordination services as a part of their Healthy Blue Medicaid benefit. Leslie Yates verbally consented to engagement with the [facility] Managed Care team.   If you are experiencing a medical emergency, please call 911 or report to your local emergency department or urgent care.   If you have a non-emergency medical problem during routine business hours, please contact your provider's office and ask to speak with a nurse.   For questions related to your Healthy [facility] health plan, please call: [phone] or visit the homepage here: [email or web address]  If you would like to schedule transportation through your Healthy [facility] plan, please call the following number at least 2 days in advance of your appointment: [phone]  Call the [facility] Crisis Line at [phone], at any time, 24 hours a day, 7 days a week. If you are in danger or need immediate medical attention call 911.  If you would like help to quit smoking, call 1-800-QUIT-NOW (936-005-1605) OR Espaol: 1-855-Djelo-Ya (6-269-485-4627) o para ms informacin haga clic aqu or Text READY to 035-009 to register via text  Leslie Yates - following are the goals we discussed in your visit today:   Goals Addressed   None     Social Worker will follow up with patient.   Leslie Yates, BSW, [place] [facility]  [place]  High Risk Managed Medicaid Team  [phone]   Following is a copy of your plan of care:

## 2021-03-02 NOTE — Patient Outreach (Signed)
Medicaid Managed Care Social Work Note  03/02/2021 Name:  Leslie Yates MRN:  [id number] DOB:  [birth year]  Leslie Yates is an 30 y.o. year old female who is a primary patient of [name], NP.  The Medicaid Managed Care Coordination team was consulted for assistance with:   Housing  Ms. Spoonemore was given information about [employer] team services today. [name] Patient agreed to services and verbal consent obtained.  Engaged with patient  for by telephone forinitial visit in response to referral for case management and/or care coordination services.   Assessments/Interventions:  Review of past medical history, allergies, medications, health status, including review of consultants reports, laboratory and other test data, was performed as part of comprehensive evaluation and provision of chronic care management services.  SDOH: (Social Determinant of Health) assessments and interventions performed:  BSW spoke with patient, she stated she is trying to get in touch with someone from housing. Patient states she is currently on maternity leave and her roommate has not paid her portion of the rent. Patient states an eviction has been filed and court is 03/16/21. Patient stated she has tried to go to a shelter but they are all full. Patient states she and her 2  children will not have anywhere to go if/once evicted. BSW contacted housing authority at [phone] and left a voicemail for [employer]. Patient stated no other resources are needed until she gets a stable environment for her 2 children.   Advanced Directives Status:  Not addressed in this encounter.  Care Plan                 Allergies  Allergen Reactions   Penicillins Rash    Childhood rash, Tolerated cefazolin (2021) Has patient had a PCN reaction causing immediate rash, facial/tongue/throat swelling, SOB or lightheadedness with hypotension: no Has patient had a PCN reaction causing severe rash  involving mucus membranes or skin necrosis:no Has patient had a PCN reaction that required hospitalization no Has patient had a PCN reaction occurring within the last 10 years: no If all of the above answers are "NO", then may proceed with Cephalosporin use.    Medications Reviewed Today     Reviewed by Bary Richard, RN (Registered Nurse) on 02/20/21 at 1729  Med List Status: <None>   Medication Order Taking? Sig Documenting Provider Last Dose Status Informant  acetaminophen (TYLENOL) 325 MG tablet [id number] Yes Take 2 tablets (650 mg total) by mouth every 4 (four) hours as needed. [name], CNM Taking Active   albuterol (VENTOLIN HFA) 108 (90 Base) MCG/ACT inhaler [id number] Yes Inhale 2 puffs into the lungs every 6 (six) hours as needed for wheezing or shortness of breath. [name], NP Taking Active   Blood Pressure Monitoring (BLOOD PRESSURE MONITOR AUTOMAT) DEVI [id number] Yes 1 Device by Does not apply route daily. Automatic blood pressure cuff regular/x-large size cuff. To monitor blood pressure regularly at home. ICD-10 code: O09.90. [name], MD Taking Active   clindamycin (CLEOCIN) 300 MG capsule [id number] Yes Take 300 mg by mouth 3 (three) times daily. [provider] Taking Active   [facility] HEMORRHOIDAL & ANALGESIC 1 % ointment [id number] Yes  [provider] Taking Active   docusate sodium (COLACE) 100 MG capsule [id number] Yes Take 1 capsule (100 mg total) by mouth 2 (two) times daily. [name], MD Taking Active   famotidine (PEPCID) 20 MG tablet [id number] No Take 20 mg by  mouth 2 (two) times daily.  Patient not taking: No sig reported   [provider] Not Taking Active   ferrous sulfate (FERROUSUL) 325 (65 FE) MG tablet [id number] Yes Take 1 tablet (325 mg total) by mouth every other day. Levie Heritage, DO Taking Active   fluticasone (FLOVENT HFA) 110 MCG/ACT inhaler [id number] Yes Inhale 2 puffs into the lungs  in the morning and at bedtime. [name], NP Taking Active            Med Note [name], [name] Feb 20, 2021  5:29 PM) Counseled on using twice daily not prn as she is currently doing  ibuprofen (ADVIL) 600 MG tablet [id number] No Take 1 tablet (600 mg total) by mouth every 6 (six) hours.  Patient not taking: No sig reported   [name], MD Not Taking Active   metoCLOPramide (REGLAN) 10 MG tablet [id number] Yes Take 10 mg by mouth 4 (four) times daily. [provider] Taking Active   NIFEdipine (PROCARDIA XL/NIFEDICAL XL) 60 MG 24 hr tablet [id number] Yes Take 60 mg by mouth daily. [provider] Taking Active   nortriptyline (PAMELOR) 25 MG capsule [id number] No Take 2 capsules every night  Patient not taking: No sig reported   [name], MD Not Taking Active            Med Note [name], [name] Feb 20, 2021  5:20 PM) States she never pick up from pharmacy  ondansetron (ZOFRAN-ODT) 4 MG disintegrating tablet [id number] No Take 4 mg by mouth every 8 (eight) hours as needed.  Patient not taking: No sig reported   [provider] Not Taking Active   Prenatal 27-1 MG TABS [id number] Yes Take 1 tablet by mouth daily. [name], CNM Taking Active   promethazine (PHENERGAN) 12.5 MG suppository [id number] Yes Place rectally. [provider] Taking Active   promethazine (PHENERGAN) 12.5 MG tablet [id number] Yes Take 2 tablets (25 mg total) by mouth every 6 (six) hours as needed for nausea or vomiting. [name], CNM Taking Active   senna-docusate (SENOKOT-S) 8.6-50 MG tablet [id number] Yes Take by mouth. [provider] Taking Active   SUMAtriptan (IMITREX) 100 MG tablet [id number] No Take 1 tablet (100 mg total) by mouth once as needed for up to 1 dose for migraine. May repeat in 2 hours if headache persists or recurs. Do not take more than 3 a week  Patient not taking: No sig reported   [name], MD Not Taking  Active             Patient Active Problem List   Diagnosis Date Noted   Encounter for postpartum visit 02/17/2021   H/O eclampsia 01/23/2021   IUP (intrauterine pregnancy), incidental 01/16/2021   Fetal cardiac anomaly affecting pregnancy, antepartum 10/20/2020   Hx of HELLP syndrome, currently pregnant, second trimester 09/16/2020   Hx of preterm delivery, currently pregnant, second trimester 09/16/2020   Supervision of high risk pregnancy, antepartum 05/23/2020   Seizure (HCC) 11/16/2019   Carrier of disease 07/01/2019   Sickle cell trait (HCC)    Obesity in pregnancy, antepartum 06/12/2019   Tobacco use 10/28/2018   Asthma 10/27/2018   Complex cyst of right ovary 05/10/2017   Nausea and vomiting during pregnancy 11/09/2016    Conditions to be addressed/monitored per PCP order:   housing  Care Plan : RN Care Manager Plan Of Care  Updates made by Gus Puma  J since 03/02/2021 12:00 AM     Problem: Chronic Disease Management and Care Coordination 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      Long-Range Goal: Development of Plan Of Care For Chronic Disease Management and Care Coordination Needs To Assist With Meeting Treatment Goals   Start Date: 02/20/2021  Expected End Date: 06/20/2021  Priority: High  Note:   Current Barriers:  Care Coordination needs related to Financial constraints related to limited income, Limited social support, Transportation, Housing barriers, Social Isolation, Lack of essential utilities - difficulty paying bills, and Lacks knowledge of community resource: related to health insurance benefits  Chronic Disease Management support and education needs related to Asthma and Post Partum Depression and smoking cessation Lacks caregiver support.  Corporate treasurerinancial Constraints.  Transportation barriers Difficulty obtaining medications Knowledge deficit related to managed Medicaid Healthy Blue plan benefits  RNCM Clinical Goal(s):  Patient will  verbalize understanding of plan for management of asthma and post partum depression and smoking cessation take all medications exactly as prescribed and will call provider for medication related questions demonstrate understanding of rationale for each prescribed medication especially asthma maintenance medication attend all scheduled medical appointments: with ob/gyn and behavioral health counselor demonstrate improved adherence to prescribed treatment plan for asthma and post partum depression as evidenced by adherence to prescribed medication regimen contacting provider for new or worsened symptoms or questions and attending virtual counseling sessions continue to work with RN Care Manager to address care management and care coordination needs related to post partum depression and asthma and smoking cessation work with pharmacist to address Financial constraints related to limited income, Limited social support, Transportation, Housing barriers, Geneticist, molecularocial Isolation, and Lack of essential utilities - difficulty paying household bills, related to post partum depression and asthma and smoking cessation work with Child psychotherapistsocial worker to Sports coachaddress Financial constraints related to limited income, Limited social support, English as a second language teacherTransportation, Housing barriers, Geneticist, molecularocial Isolation, and Lack of essential utilities - and difficulty paying household bills related to the management of post partum depression and asthma through collaboration with Medical illustratorN Care manager, provider, and care team.   Interventions: Inter-disciplinary care team collaboration (see longitudinal plan of care) Evaluation of current treatment plan related to  self management and patient's adherence to plan as established by provider Mailed health education articles and [facility] Care Management spiral bound calendar with health education BSW spoke with patient, she stated she is trying to get in touch with someone from housing. Patient states she is  currently on maternity leave and her roommate has not paid her portion of the rent. Patient states an eviction has been filed and court is 03/16/21. Patient stated she has tried to go to a shelter but they are all full. Patient states she and her 2  children will not have anywhere to go if/once evicted. BSW contacted housing authority at [phone] and left a voicemail for [employer]. Patient stated no other resources are needed until she gets a stable environment for her 2 children.   Interdisciplinary Collaboration:  (Status: New goal.) Collaborated with BSW to initiate plan of care to address needs related to Financial constraints related to limited income, Limited social support, Transportation, Housing barriers, Geneticist, molecularocial Isolation, Limited access to caregiver, and Lacks knowledge of community resource: related to health plan benefits  in patient with  post partum depression and asthma  and smoking cessation Collaboration with Grayce SessionsEdwards, Michelle P, NP regarding development and update of comprehensive plan of care as evidenced by provider attestation and co-signature Inter-disciplinary  care team collaboration (see longitudinal plan of care)  Asthma: (Status: New goal.) Provided patient with basic written and verbal asthma education on self care/management/and exacerbation prevention; mailed booklet entitled "Breathing Free" - A Treatment Guide for People with Asthma" Advised patient to track and manage asthma triggers;  Provided instruction about proper use of medications used for management of asthma including inhalers; both maintenance and rescue Advised patient to self assesses asthma action plan zone and make appointment with provider if in the yellow zone for 48 hours without improvement; Assessed social determinant of health barriers;   Smoking Cessation: (Status: New goal.) Reviewed smoking history:  tobacco abuse of 17 years; currently smoking 1 ppd Previous quit attempts, unsuccessful 0;  successful using 0  Denies smoking within 30 minutes of waking up Reports triggers to smoke include: stress, anxiety Reports motivation to quit smoking includes: living to see her children grow up On a scale of 1-10, reports MOTIVATION to quit is 9 On a scale of 1-10, reports CONFIDENCE in quitting is 5  Evaluation of current treatment plan reviewed; Advised patient to discuss smoking cessation options with provider; Provided contact information for [place] Quit Line (1-800-QUIT-NOW);  Provided patient with printed smoking cessation educational materials; Discussed plans with patient for ongoing care management follow up and provided patient with direct contact information for care management team; Provided health plan benefits related to smoking cessation  Patient Goals/Self-Care Activities: Patient will self administer medications as prescribed Patient will attend all scheduled provider appointments Patient will call pharmacy for medication refills Patient will investigate smoking cessation benefits offered by health plan Patient will continue to perform ADL's independently Patient will continue to perform IADL's independently Patient will call provider office for new concerns or questions Patient will work with BSW and pharmacist to address care coordination needs and will continue to work with the clinical team to address health care and disease management related needs.         Follow up:  Patient agrees to Care Plan and Follow-up.  Plan: The Managed Medicaid care management team will reach out to the patient again over the next 7 days.  Date/time of next scheduled Social Work care management/care coordination outreach: 03/13/21 Gus Puma, Kenard Gower, [facility]  [facility]  High Risk Managed Medicaid Team  [phone]

## 2021-03-07 DIAGNOSIS — L732 Hidradenitis suppurativa: Secondary | ICD-10-CM | POA: Diagnosis not present

## 2021-03-09 ENCOUNTER — Ambulatory Visit: Payer: Medicaid Other | Admitting: Obstetrics and Gynecology

## 2021-03-13 ENCOUNTER — Other Ambulatory Visit: Payer: Self-pay

## 2021-03-13 NOTE — Patient Outreach (Signed)
Medicaid Managed Care Social Work Note  03/13/2021 Name:  Leslie Yates MRN:  [id number] DOB:  April 29, 1991  Leslie Yates is an 30 y.o. year old female who is a primary patient of [name], NP.  The [facility] Managed Care Coordination team was consulted for assistance with:   housing   Ms. Dial was given information about [employer] team services today. [name] Patient agreed to services and verbal consent obtained.  Engaged with patient  for by telephone forfollow up visit in response to referral for case management and/or care coordination services.   Assessments/Interventions:  Review of past medical history, allergies, medications, health status, including review of consultants reports, laboratory and other test data, was performed as part of comprehensive evaluation and provision of chronic care management services.  SDOH: (Social Determinant of Health) assessments and interventions performed: BSW received a telephone call and message from Jari Sportsman with housing authority stating that if patient has had any changes she would need to update those changes. Patient stated she did update all of her information. She does have eviction court coming up on 9/22 and she is unsure if the other portion of rent has been made.    Advanced Directives Status:  Not addressed in this encounter.  Care Plan                 Allergies  Allergen Reactions   Penicillins Rash    Childhood rash, Tolerated cefazolin (2021) Has patient had a PCN reaction causing immediate rash, facial/tongue/throat swelling, SOB or lightheadedness with hypotension: no Has patient had a PCN reaction causing severe rash involving mucus membranes or skin necrosis:no Has patient had a PCN reaction that required hospitalization no Has patient had a PCN reaction occurring within the last 10 years: no If all of the above answers are "NO", then may proceed with Cephalosporin use.     Medications Reviewed Today     Reviewed by Bary Richard, RN (Registered Nurse) on 02/20/21 at 1729  Med List Status: <None>   Medication Order Taking? Sig Documenting Provider Last Dose Status Informant  acetaminophen (TYLENOL) 325 MG tablet [id number] Yes Take 2 tablets (650 mg total) by mouth every 4 (four) hours as needed. [name], CNM Taking Active   albuterol (VENTOLIN HFA) 108 (90 Base) MCG/ACT inhaler [id number] Yes Inhale 2 puffs into the lungs every 6 (six) hours as needed for wheezing or shortness of breath. [name], NP Taking Active   Blood Pressure Monitoring (BLOOD PRESSURE MONITOR AUTOMAT) DEVI [id number] Yes 1 Device by Does not apply route daily. Automatic blood pressure cuff regular/x-large size cuff. To monitor blood pressure regularly at home. ICD-10 code: O09.90. [name], MD Taking Active   clindamycin (CLEOCIN) 300 MG capsule [id number] Yes Take 300 mg by mouth 3 (three) times daily. [provider] Taking Active   [employer] HEMORRHOIDAL & ANALGESIC 1 % ointment [id number] Yes  [provider] Taking Active   docusate sodium (COLACE) 100 MG capsule [id number] Yes Take 1 capsule (100 mg total) by mouth 2 (two) times daily. [name], MD Taking Active   famotidine (PEPCID) 20 MG tablet [id number] No Take 20 mg by mouth 2 (two) times daily.  Patient not taking: No sig reported   [provider] Not Taking Active   ferrous sulfate (FERROUSUL) 325 (65 FE) MG tablet [id number] Yes Take 1 tablet (325 mg total) by mouth every other day. Levie Heritage, DO  Taking Active   fluticasone (FLOVENT HFA) 110 MCG/ACT inhaler [id number] Yes Inhale 2 puffs into the lungs in the morning and at bedtime. [name], NP Taking Active            Med Note [name], [name] Feb 20, 2021  5:29 PM) Counseled on using twice daily not prn as she is currently doing  ibuprofen (ADVIL) 600 MG tablet [id number] No Take 1 tablet  (600 mg total) by mouth every 6 (six) hours.  Patient not taking: No sig reported   [name], MD Not Taking Active   metoCLOPramide (REGLAN) 10 MG tablet [id number] Yes Take 10 mg by mouth 4 (four) times daily. [provider] Taking Active   NIFEdipine (PROCARDIA XL/NIFEDICAL XL) 60 MG 24 hr tablet [id number] Yes Take 60 mg by mouth daily. [provider] Taking Active   nortriptyline (PAMELOR) 25 MG capsule [id number] No Take 2 capsules every night  Patient not taking: No sig reported   [name], MD Not Taking Active            Med Note [name], [name] Feb 20, 2021  5:20 PM) States she never pick up from pharmacy  ondansetron (ZOFRAN-ODT) 4 MG disintegrating tablet [id number] No Take 4 mg by mouth every 8 (eight) hours as needed.  Patient not taking: No sig reported   [provider] Not Taking Active   Prenatal 27-1 MG TABS [id number] Yes Take 1 tablet by mouth daily. [name], CNM Taking Active   promethazine (PHENERGAN) 12.5 MG suppository [id number] Yes Place rectally. [provider] Taking Active   promethazine (PHENERGAN) 12.5 MG tablet [id number] Yes Take 2 tablets (25 mg total) by mouth every 6 (six) hours as needed for nausea or vomiting. [name], CNM Taking Active   senna-docusate (SENOKOT-S) 8.6-50 MG tablet [id number] Yes Take by mouth. [provider] Taking Active   SUMAtriptan (IMITREX) 100 MG tablet [id number] No Take 1 tablet (100 mg total) by mouth once as needed for up to 1 dose for migraine. May repeat in 2 hours if headache persists or recurs. Do not take more than 3 a week  Patient not taking: No sig reported   [name], MD Not Taking Active             Patient Active Problem List   Diagnosis Date Noted   Encounter for postpartum visit 02/17/2021   H/O eclampsia 01/23/2021   IUP (intrauterine pregnancy), incidental 01/16/2021   Fetal cardiac anomaly affecting pregnancy, antepartum  10/20/2020   Hx of HELLP syndrome, currently pregnant, second trimester 09/16/2020   Hx of preterm delivery, currently pregnant, second trimester 09/16/2020   Supervision of high risk pregnancy, antepartum 05/23/2020   Seizure (HCC) 11/16/2019   Carrier of disease 07/01/2019   Sickle cell trait (HCC)    Obesity in pregnancy, antepartum 06/12/2019   Tobacco use 10/28/2018   Asthma 10/27/2018   Complex cyst of right ovary 05/10/2017   Nausea and vomiting during pregnancy 11/09/2016    Conditions to be addressed/monitored per PCP order:   possible eviction  There are no care plans that you recently modified to display for this patient.   Follow up:  Patient agrees to Care Plan and Follow-up.  Plan: The Managed Medicaid care management team will reach out to the patient again over the next 7 days.  Date/time of next scheduled Social Work care management/care coordination outreach:  03/22/21  Jon Gills  Kristian Covey, MHA [facility]  [employer] Risk Managed Medicaid Team  [phone]

## 2021-03-13 NOTE — Patient Instructions (Signed)
Visit Information  Leslie Yates was given information about Medicaid Managed Care team care coordination services as a part of their Healthy Blue Medicaid benefit. [name] verbally consented to engagement with the [facility] Managed Care team.   If you are experiencing a medical emergency, please call 911 or report to your local emergency department or urgent care.   If you have a non-emergency medical problem during routine business hours, please contact your provider's office and ask to speak with a nurse.   For questions related to your Healthy [facility] health plan, please call: [phone] or visit the homepage here: [email or web address]  If you would like to schedule transportation through your Healthy [facility] plan, please call the following number at least 2 days in advance of your appointment: [phone]  Call the [facility] Crisis Line at [phone], at any time, 24 hours a day, 7 days a week. If you are in danger or need immediate medical attention call 911.  If you would like help to quit smoking, call 1-800-QUIT-NOW (3303542077) OR Espaol: 1-855-Djelo-Ya (0-165-537-4827) o para ms informacin haga clic aqu or Text READY to 078-675 to register via text  Ms. Brigance - following are the goals we discussed in your visit today:   Goals Addressed   None      Social Worker will follow up with patient .   Gus Puma, BSW, [place] [facility]  [place]  High Risk Managed Medicaid Team  [phone]   Following is a copy of your plan of care:  There are no care plans that you recently modified to display for this patient.

## 2021-03-18 ENCOUNTER — Inpatient Hospital Stay: Admission: RE | Admit: 2021-03-18 | Payer: Medicaid Other | Source: Ambulatory Visit

## 2021-03-18 ENCOUNTER — Other Ambulatory Visit: Payer: Medicaid Other

## 2021-03-21 ENCOUNTER — Other Ambulatory Visit: Payer: Self-pay | Admitting: *Deleted

## 2021-03-21 NOTE — Patient Instructions (Signed)
Visit Information  Ms. Bousquet was given information about Medicaid Managed Care team care coordination services as a part of their Healthy Blue Medicaid benefit. [name] verbally consented to engagement with the [facility] Managed Care team.   If you are experiencing a medical emergency, please call 911 or report to your local emergency department or urgent care.   If you have a non-emergency medical problem during routine business hours, please contact your provider's office and ask to speak with a nurse.   For questions related to your Healthy [facility] health plan, please call: [phone] or visit the homepage here: [email or web address]  If you would like to schedule transportation through your Healthy [facility] plan, please call the following number at least 2 days in advance of your appointment: [phone]  Call the [facility] Crisis Line at [phone], at any time, 24 hours a day, 7 days a week. If you are in danger or need immediate medical attention call 911.  If you would like help to quit smoking, call 1-800-QUIT-NOW (431-494-6319) OR Espaol: 1-855-Djelo-Ya (0-109-323-5573) o para ms informacin haga clic aqu or Text READY to 220-254 to register via text  Ms. Navia - following are the goals we discussed in your visit today:   Goals Addressed             This Visit's Progress    Begin and Stick with Counseling-Depression       Timeframe:  Long-Range Goal Priority:  High Start Date:     02/20/21                        Expected End Date:    ongoing                   Follow Up Date 04/18/21    - keep 90 percent of counseling appointments - schedule counseling appointment    Why is this important?   Beating depression may take some time.  If you don't feel better right away, don't give up on your treatment plan.    Notes: Patient did not keep initial virtual counseling appointment on 02/22/21. Encouraged her to  reschedule.      Find Help in My Community       Timeframe:  Long-Range Goal Priority:  High Start Date:  02/20/21                           Expected End Date:     ongoing                  Follow Up Date 04/18/21    - follow-up on any referrals for help I am given - think ahead to make sure my need does not become an emergency    Why is this important?   Knowing how and where to find help for yourself or family in your neighborhood and community is an important skill.  You will want to take some steps to learn how.    Notes: 03/21/21- Patient states she has spoken with BSW Gus Puma and is working with [employer] to update her application to include her baby's social security number     Learn More About My Health       Timeframe:  Long-Range Goal Priority:  High Start Date:   02/20/21  Expected End Date:   ongoing                     Follow Up Date 04/18/21    - tell my story and reason for my visit - make a list of questions - ask questions - repeat what I heard to make sure I understand - bring a list of my medicines to the visit - speak up when I don't understand  -investigate smoking cessation benefits offered by my health plan   Why is this important?   The best way to learn about your health and care is by talking to the doctor and nurse.  They will answer your questions and give you information in the way that you like best.    Notes: 03/21/21- patient states she has not had time to call her health plan to investigate benefits related to smoking cessation and weight management      Make and Keep All Appointments       Timeframe:  Long-Range Goal Priority:  High Start Date:      02/20/21                       Expected End Date:   ongoing                    Follow Up Date 04/18/21    - arrange a ride through an agency 1 week before appointment - ask family or friend for a ride - call to cancel if needed - keep a  calendar with appointment dates    Why is this important?   Part of staying healthy is seeing the doctor for follow-up care.  If you forget your appointments, there are some things you can do to stay on track.    Notes: 03/21/21- Patient did not keep initial virtual counseling appointment on 02/22/21 and she canceled the MRI/MRA of her head and brain that was scheduled for 03/18/21.         The patient verbalized understanding of instructions provided today and declined a print copy of patient instruction materials.   The Managed Medicaid care management team will reach out to the patient again over the next 7 days.   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      Long-Range Goal: Development of Plan Of Care For Chronic Disease Management and Care Coordination Needs To Assist With Meeting Treatment Goals   Start Date: 02/20/2021  Expected End Date: 06/20/2021  Priority: High  Note:   Current Barriers:  Care Coordination needs related to Financial constraints related to limited income, Limited social support, Transportation, Housing barriers, Social Isolation, Lack of essential utilities - difficulty paying bills, and Lacks knowledge of community resource: related to health insurance benefits  Chronic Disease Management support and education needs related to Asthma and Post Partum Depression and smoking cessation Lacks caregiver support.  Corporate treasurer.  Transportation barriers Difficulty obtaining medications Knowledge deficit related to managed Medicaid Healthy Blue plan benefits Housing Insecurity- 03/21/21- patient reports  another eviction court hearing has been scheduled for 03/28/21  RNCM Clinical Goal(s):  Patient will verbalize understanding of plan for management of asthma and post partum depression and smoking cessation take all medications exactly as prescribed and will call provider for medication related questions demonstrate understanding of rationale for each prescribed medication especially asthma maintenance medication attend all scheduled medical appointments: with ob/gyn and behavioral health counselor demonstrate improved adherence to prescribed treatment plan for asthma and post partum depression as evidenced by adherence to prescribed medication regimen contacting provider for new or worsened symptoms or questions and attending virtual counseling sessions continue to work with RN Care Manager to address care management and care coordination needs related to post partum depression and asthma and smoking cessation work with pharmacist to address Financial constraints related to limited income, Limited social support, Transportation, Housing barriers, Geneticist, molecular, and Lack of essential utilities - difficulty paying household bills, related to post partum depression and asthma and smoking cessation work with Child psychotherapist to Sports coach constraints related to limited income, Limited social support, English as a second language teacher, Housing barriers, Geneticist, molecular, and Lack of essential utilities - and difficulty paying household bills related to the management of post partum depression and asthma through collaboration with Medical illustrator, provider, and care team.   Interventions: Inter-disciplinary care team collaboration (see longitudinal plan of care) Evaluation of current treatment plan related to  self management and patient's adherence to plan as established by provider Mailed health education articles and [facility] Care Management spiral bound calendar with health education BSW spoke with  patient, she stated she is trying to get in touch with someone from housing. Patient states she is currently on maternity leave and her roommate has not paid her portion of the rent. Patient states an eviction has been filed and court is 03/16/21. Patient stated she has tried to go to a shelter but they are all full. Patient states she and her 2  children will not have anywhere to go if/once evicted. BSW contacted housing authority at [phone] and left a voicemail for [employer]. Patient stated no other resources are needed until she gets a stable environment for her 2 children. 03/21/21- Notified BSW Gus Puma that another eviction court hearing is scheduled for 03/28/21   Interdisciplinary Collaboration:  (Status: Goal on track: YES. 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 to address needs related to Financial constraints related to limited income, Limited social support, English as a second language teacher, Housing barriers, Geneticist, molecular, Limited access to caregiver, and Lacks knowledge of community resource: related to health plan benefits  in patient with  post partum depression and asthma  and smoking cessation Collaboration with [name], [name], NP regarding development and update of comprehensive plan of care as evidenced by provider attestation and co-signature Inter-disciplinary care team collaboration (see longitudinal plan of care)  Asthma: (Status: Goal on track: YES.)- patient reports no recent  asthma exacerbations; states she received the health information in the mail but has not reviewed it yet as she remains very concerned that she is going to be evicted from her apartment Provided patient with basic written and verbal asthma education on self care/management/and exacerbation prevention; mailed booklet entitled  "Breathing Free" - A Treatment Guide for People with Asthma" Advised patient to track and manage asthma triggers;  Provided instruction about proper use of medications used for management of asthma including inhalers;  both maintenance and rescue Advised patient to self assesses asthma action plan zone and make appointment with provider if in the yellow zone for 48 hours without improvement; Assessed social determinant of health barriers;   Smoking Cessation: (Status: Goal on track: NO.) - patient reports she is still smoking and has not had time to call health plan customer services to enroll in the smoking cessation program as she is worried about being evicted from her apartment Reviewed smoking history:  tobacco abuse of 17 years; currently smoking 1 ppd Previous quit attempts, unsuccessful 0; successful using 0  Denies smoking within 30 minutes of waking up Reports triggers to smoke include: stress, anxiety Reports motivation to quit smoking includes: living to see her children grow up On a scale of 1-10, reports MOTIVATION to quit is 9 On a scale of 1-10, reports CONFIDENCE in quitting is 5  Evaluation of current treatment plan reviewed; Advised patient to discuss smoking cessation options with provider; Provided contact information for [address] Quit Line (1-800-QUIT-NOW);  Provided patient with printed smoking cessation educational materials; Discussed plans with patient for ongoing care management follow up and provided patient with direct contact information for care management team; Provided health plan benefits related to smoking cessation  Patient Goals/Self-Care Activities: Patient will self administer medications as prescribed Patient will attend all scheduled provider appointments Patient will call pharmacy for medication refills Patient will investigate smoking cessation benefits offered by health plan Patient will continue to perform ADL's independently Patient will continue to  perform IADL's independently Patient will call provider office for new concerns or questions Patient will work with BSW and pharmacist to address care coordination needs and will continue to work with the clinical team to address health care and disease management related needs.

## 2021-03-21 NOTE — Patient Outreach (Signed)
Medicaid Managed Care   Nurse Care Manager Note  03/21/2021 Name:  Leslie Yates MRN:  [id number] DOB:  Oct 26, 1990  Leslie Yates is an 30 y.o. year old female who is a primary patient of Leslie Sessions, NP.  The [facility] Managed Care Coordination team was consulted for assistance with:    Asthma, smoking cessation, post partum depression  [name] was given information about [employer] team services today. [name] Patient agreed to services and verbal consent obtained.  Engaged with patient by telephone for follow up visit in response to provider referral for case management and/or care coordination services.   Assessments/Interventions:  Review of past medical history, allergies, medications, health status, including review of consultants reports, laboratory and other test data, was performed as part of comprehensive evaluation and provision of chronic care management services.  SDOH (Social Determinants of Health) assessments and interventions performed: SDOH Interventions    Flowsheet Row Most Recent Value  SDOH Interventions   Housing Interventions Other (Comment)  [notified BSW of eviction  court hearing scheduled for 03/28/21]  Transportation Interventions --  [have asked BSW to address this on her call with patient on 03/22/21]       Care Plan  Allergies  Allergen Reactions   Penicillins Rash    Childhood rash, Tolerated cefazolin (2021) Has patient had a PCN reaction causing immediate rash, facial/tongue/throat swelling, SOB or lightheadedness with hypotension: no Has patient had a PCN reaction causing severe rash involving mucus membranes or skin necrosis:no Has patient had a PCN reaction that required hospitalization no Has patient had a PCN reaction occurring within the last 10 years: no If all of the above answers are "NO", then may proceed with Cephalosporin use.    Medications Reviewed Today     Reviewed by Bary Richard, RN  (Registered Nurse) on 03/21/21 at 1807  Med List Status: <None>   Medication Order Taking? Sig Documenting Provider Last Dose Status Informant  acetaminophen (TYLENOL) 325 MG tablet [id number] No Take 2 tablets (650 mg total) by mouth every 4 (four) hours as needed. [name], CNM Taking Active   albuterol (VENTOLIN HFA) 108 (90 Base) MCG/ACT inhaler [id number] No Inhale 2 puffs into the lungs every 6 (six) hours as needed for wheezing or shortness of breath. Leslie Sessions, NP Taking Active   Blood Pressure Monitoring (BLOOD PRESSURE MONITOR AUTOMAT) DEVI [id number] No 1 Device by Does not apply route daily. Automatic blood pressure cuff regular/x-large size cuff. To monitor blood pressure regularly at home. ICD-10 code: O09.90. [name], MD Taking Active   clindamycin (CLEOCIN) 300 MG capsule [id number] No Take 300 mg by mouth 3 (three) times daily. [provider] Taking Active   [facility] HEMOR[facility]HO[facility]NALGESIC 1 % ointment [id number] No  [provider] Taking Active   docusate sodium (COLACE) 100 MG capsule [id number] No Take 1 capsule (100 mg total) by mouth 2 (two) times daily. [name], MD Taking Active   famotidine (PEPCID) 20 MG tablet [id number] No Take 20 mg by mouth 2 (two) times daily.  Patient not taking: No sig reported   [provider] Not Taking Active   ferrous sulfate (FERROUSUL) 325 (65 FE) MG tablet [id number] No Take 1 tablet (325 mg total) by mouth every other day. Levie Heritage, DO Taking Active   fluticasone (FLOVENT HFA) 110 MCG/ACT inhaler [id number] No Inhale 2 puffs into the lungs in the morning and at bedtime. [name],  [name], NP Taking Active            Med Note [name] Feb 20, 2021  5:29 PM) Counseled on using twice daily not prn as she is currently doing  ibuprofen (ADVIL) 600 MG tablet [id number] No Take 1 tablet (600 mg total) by mouth every 6 (six) hours.  Patient not taking: No sig  reported   [name], MD Not Taking Active   metoCLOPramide (REGLAN) 10 MG tablet [id number] No Take 10 mg by mouth 4 (four) times daily. [provider] Taking Active   NIFEdipine (PROCARDIA XL/NIFEDICAL XL) 60 MG 24 hr tablet [id number] No Take 60 mg by mouth daily. [provider] Taking Active   NIFEdipine (PROCARDIA XL/NIFEDICAL XL) 60 MG 24 hr tablet [id number]  Take 1 tablet (60 mg total) by mouth daily. [name], MD  Active   nortriptyline (PAMELOR) 25 MG capsule [id number] No Take 2 capsules every night  Patient not taking: No sig reported   [name], MD Not Taking Active            Med Note [name], [name] Feb 20, 2021  5:20 PM) States she never pick up from pharmacy  ondansetron (ZOFRAN-ODT) 4 MG disintegrating tablet [id number] No Take 4 mg by mouth every 8 (eight) hours as needed.  Patient not taking: No sig reported   [provider] Not Taking Active   Prenatal 27-1 MG TABS [id number] No Take 1 tablet by mouth daily. [name], CNM Taking Active   promethazine (PHENERGAN) 12.5 MG suppository [id number] No Place rectally. [provider] Taking Active   promethazine (PHENERGAN) 12.5 MG tablet [id number] No Take 2 tablets (25 mg total) by mouth every 6 (six) hours as needed for nausea or vomiting. [name], CNM Taking Active   SUMAtriptan (IMITREX) 100 MG tablet [id number] No Take 1 tablet (100 mg total) by mouth once as needed for up to 1 dose for migraine. May repeat in 2 hours if headache persists or recurs. Do not take more than 3 a week  Patient not taking: No sig reported   [name], MD Not Taking Active             Patient Active Problem List   Diagnosis Date Noted   Encounter for postpartum visit 02/17/2021   H/O eclampsia 01/23/2021   IUP (intrauterine pregnancy), incidental 01/16/2021   Fetal cardiac anomaly affecting pregnancy, antepartum 10/20/2020   Hx of HELLP syndrome, currently  pregnant, second trimester 09/16/2020   Hx of preterm delivery, currently pregnant, second trimester 09/16/2020   Supervision of high risk pregnancy, antepartum 05/23/2020   Seizure (HCC) 11/16/2019   Carrier of disease 07/01/2019   Sickle cell trait (HCC)    Obesity in pregnancy, antepartum 06/12/2019   Tobacco use 10/28/2018   Asthma 10/27/2018   Complex cyst of right ovary 05/10/2017   Nausea and vomiting during pregnancy 11/09/2016    Conditions to be addressed/monitored per PCP order:   asthma, smoking cessation and post partum depression  Care Plan : RN Care Manager Plan Of Care  Updates made by Bary Richard, RN since 03/21/2021 12:00 AM     Problem: Chronic Disease Management and Care Coordination 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      Long-Range Goal: Development of Plan Of Care For Chronic Disease Management and Care Coordination Needs To Assist With Meeting Treatment Goals   Start Date: 02/20/2021  Expected End Date: 06/20/2021  Priority: High  Note:   Current Barriers:  Care Coordination needs related to Financial constraints related to limited income, Limited social support, Transportation, Housing barriers, Social Isolation, Lack of essential utilities - difficulty paying bills, and Lacks knowledge of community resource: related to health insurance benefits  Chronic Disease Management support and education needs related to Asthma and Post Partum Depression and smoking cessation Lacks caregiver support.  Corporate treasurer.  Transportation barriers Difficulty obtaining medications Knowledge deficit related to managed Medicaid Healthy Blue plan benefits Housing Insecurity- 03/21/21- patient reports another eviction court hearing has been scheduled for 03/28/21  RNCM Clinical Goal(s):  Patient will verbalize understanding of plan for management of asthma and post partum depression and smoking cessation take all medications exactly as prescribed and will  call provider for medication related questions demonstrate understanding of rationale for each prescribed medication especially asthma maintenance medication attend all scheduled medical appointments: with ob/gyn and behavioral health counselor demonstrate improved adherence to prescribed treatment plan for asthma and post partum depression as evidenced by adherence to prescribed medication regimen contacting provider for new or worsened symptoms or questions and attending virtual counseling Yates continue to work with RN Care Manager to address care management and care coordination needs related to post partum depression and asthma and smoking cessation work with pharmacist to address Financial constraints related to limited income, Limited social support, Transportation, Housing barriers, Geneticist, molecular, and Lack of essential utilities - difficulty paying household bills, related to post partum depression and asthma and smoking cessation work with Child psychotherapist to Sports coach constraints related to limited income, Limited social support, English as a second language teacher, Housing barriers, Geneticist, molecular, and Lack of essential utilities - and difficulty paying household bills related to the management of post partum depression and asthma through collaboration with Medical illustrator, provider, and care team.   Interventions: Inter-disciplinary care team collaboration (see longitudinal plan of care) Evaluation of current treatment plan related to  self management and patient's adherence to plan as established by provider Mailed health education articles and [facility] Care Management spiral bound calendar with health education BSW spoke with patient, she stated she is trying to get in touch with someone from housing. Patient states she is currently on maternity leave and her roommate has not paid her portion of the rent. Patient states an eviction has been filed and court is 03/16/21. Patient stated  she has tried to go to a shelter but they are all full. Patient states she and her 2  children will not have anywhere to go if/once evicted. BSW contacted housing authority at [phone] and left a voicemail for [employer]. Patient stated no other resources are needed until she gets a stable environment for her 2 children. 03/21/21- Notified BSW Gus Puma that another eviction court hearing is scheduled for 03/28/21   Interdisciplinary Collaboration:  (Status: Goal on track: YES. 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 to address needs related to Financial constraints related to limited income, Limited social support, English as a second language teacher, Housing barriers, Geneticist, molecular, Limited access to caregiver, and Lacks knowledge of community resource: related to health plan benefits  in patient with  post partum depression and asthma  and smoking cessation Collaboration with [name], [name], NP regarding development and update of comprehensive plan of care as evidenced by  provider attestation and co-signature Inter-disciplinary care team collaboration (see longitudinal plan of care)  Asthma: (Status: Goal on track: YES.)- patient reports no recent  asthma exacerbations; states she received the health information in the mail but has not reviewed it yet as she remains very concerned that she is going to be evicted from her apartment Provided patient with basic written and verbal asthma education on self care/management/and exacerbation prevention; mailed booklet entitled "Breathing Free" - A Treatment Guide for People with Asthma" Advised patient to track and manage asthma triggers;  Provided instruction about proper use of medications used for management of asthma including inhalers; both maintenance and rescue Advised  patient to self assesses asthma action plan zone and make appointment with provider if in the yellow zone for 48 hours without improvement; Assessed social determinant of health barriers;   Smoking Cessation: (Status: Goal on track: NO.) - patient reports she is still smoking and has not had time to call health plan customer services to enroll in the smoking cessation program as she is worried about being evicted from her apartment Reviewed smoking history:  tobacco abuse of 17 years; currently smoking 1 ppd Previous quit attempts, unsuccessful 0; successful using 0  Denies smoking within 30 minutes of waking up Reports triggers to smoke include: stress, anxiety Reports motivation to quit smoking includes: living to see her children grow up On a scale of 1-10, reports MOTIVATION to quit is 9 On a scale of 1-10, reports CONFIDENCE in quitting is 5  Evaluation of current treatment plan reviewed; Advised patient to discuss smoking cessation options with provider; Provided contact information for [place] Quit Line (1-800-QUIT-NOW);  Provided patient with printed smoking cessation educational materials; Discussed plans with patient for ongoing care management follow up and provided patient with direct contact information for care management team; Provided health plan benefits related to smoking cessation  Patient Goals/Self-Care Activities: Patient will self administer medications as prescribed Patient will attend all scheduled provider appointments Patient will call pharmacy for medication refills Patient will investigate smoking cessation benefits offered by health plan Patient will continue to perform ADL's independently Patient will continue to perform IADL's independently Patient will call provider office for new concerns or questions Patient will work with BSW and pharmacist to address care coordination needs and will continue to work with the clinical team to address health care and disease  management related needs.         Follow Up:  Patient agrees to Care Plan and Follow-up.  Plan: The Managed Medicaid care management team will reach out to the patient again over the next 7 days.  Date/time of next scheduled RN care management/care coordination outreach:  04/18/21 at 4:00 pm  Cranford Mon RN, CCM, CDCES [place]  [facility] Care Management Coordinator - Managed [place] High Risk [phone]

## 2021-03-22 ENCOUNTER — Other Ambulatory Visit: Payer: Self-pay

## 2021-03-22 NOTE — Patient Instructions (Signed)
Visit Information  Ms. Moehring was given information about Medicaid Managed Care team care coordination services as a part of their Healthy Blue Medicaid benefit. [name] verbally consented to engagement with the [facility] Managed Care team.   If you are experiencing a medical emergency, please call 911 or report to your local emergency department or urgent care.   If you have a non-emergency medical problem during routine business hours, please contact your provider's office and ask to speak with a nurse.   For questions related to your Healthy [facility] health plan, please call: [phone] or visit the homepage here: [email or web address]  If you would like to schedule transportation through your Healthy [facility] plan, please call the following number at least 2 days in advance of your appointment: [phone]  Call the [facility] Crisis Line at [phone], at any time, 24 hours a day, 7 days a week. If you are in danger or need immediate medical attention call 911.  If you would like help to quit smoking, call 1-800-QUIT-NOW ((701)036-8566) OR Espaol: 1-855-Djelo-Ya (9-675-916-3846) o para ms informacin haga clic aqu or Text READY to 659-935 to register via text  Ms. Siller - following are the goals we discussed in your visit today:   Goals Addressed   None      Social Worker will follow up with patient.   Gus Puma, BSW, MHA [facility]  [place]  High Risk Managed Medicaid Team  [phone]   Following is a copy of your plan of care:  Care Plan : RN Care Manager Plan Of Care  Updates made by Shaune Leeks since 03/22/2021 12:00 AM     Problem: Chronic Disease Management and Care Coordination 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      Long-Range Goal: Development of Plan Of Care For Chronic Disease Management and Care Coordination Needs To Assist With Meeting Treatment Goals    Start Date: 02/20/2021  Expected End Date: 06/20/2021  Priority: High  Note:   Current Barriers:  Care Coordination needs related to Financial constraints related to limited income, Limited social support, Transportation, Housing barriers, Social Isolation, Lack of essential utilities - difficulty paying bills, and Lacks knowledge of community resource: related to health insurance benefits  Chronic Disease Management support and education needs related to Asthma and Post Partum Depression and smoking cessation Lacks caregiver support.  Corporate treasurer.  Transportation barriers Difficulty obtaining medications Knowledge deficit related to managed Medicaid Healthy Blue plan benefits Housing Insecurity- 03/21/21- patient reports another eviction court hearing has been scheduled for 03/28/21  RNCM Clinical Goal(s):  Patient will verbalize understanding of plan for management of asthma and post partum depression and smoking cessation take all medications exactly as prescribed and will call provider for medication related questions demonstrate understanding of rationale for each prescribed medication especially asthma maintenance medication attend all scheduled medical appointments: with ob/gyn and behavioral health counselor demonstrate improved adherence to prescribed treatment plan for asthma and post partum depression as evidenced by adherence to prescribed medication regimen contacting provider for new or worsened symptoms or questions and attending virtual counseling sessions continue to work with RN Care Manager to address care management and care coordination needs related to post partum depression and asthma and smoking cessation work with pharmacist to address Financial constraints related to limited income, Limited social support, Transportation, Housing barriers, Social Isolation, and Lack of essential utilities - difficulty paying household bills, related to post partum depression  and asthma and smoking  cessation work with Child psychotherapist to Sports coach constraints related to limited income, Limited social support, English as a second language teacher, Housing barriers, Geneticist, molecular, and Lack of essential utilities - and difficulty paying household bills related to the management of post partum depression and asthma through collaboration with Medical illustrator, provider, and care team.   Interventions: Inter-disciplinary care team collaboration (see longitudinal plan of care) Evaluation of current treatment plan related to  self management and patient's adherence to plan as established by provider Mailed health education articles and [facility] Care Management spiral bound calendar with health education BSW spoke with patient, she stated she is trying to get in touch with someone from housing. Patient states she is currently on maternity leave and her roommate has not paid her portion of the rent. Patient states an eviction has been filed and court is 03/16/21. Patient stated she has tried to go to a shelter but they are all full. Patient states she and her 2  children will not have anywhere to go if/once evicted. BSW contacted housing authority at [phone] and left a voicemail for [employer]. Patient stated no other resources are needed until she gets a stable environment for her 2 children. 03/21/21- Notified BSW Gus Puma that another eviction court hearing is scheduled for 03/28/21 03/22/21: BSW followed up with patient. She stated she still has not heard anything from housing authority. BSW suggested patient go to housing authority to have childs SSN added. Patient stated she does have court on 03/28/21 and is still looking for someone to move just in case housing authority does not respond. Patient stated that she currenlty has transportation but her truck does need some work. She is unsure of how much longer it will last because she does not have the money to get it fixed.     Interdisciplinary Collaboration:  (Status: Goal on track: YES. 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 to address needs related to Financial constraints related to limited income, Limited social support, English as a second language teacher, Housing barriers, Geneticist, molecular, Limited access to caregiver, and Lacks knowledge of community resource: related to health plan benefits  in patient with  post partum depression and asthma  and smoking cessation Collaboration with [name], [name], NP regarding development and update of comprehensive plan of care as evidenced by provider attestation and co-signature Inter-disciplinary care team collaboration (see longitudinal plan of care)  Asthma: (Status: Goal on track: YES.)- patient reports no recent  asthma exacerbations; states she received the health information in the mail but has not reviewed it yet as she remains very concerned that she is going to be evicted from her apartment Provided patient with basic written and verbal asthma education on self care/management/and exacerbation prevention; mailed booklet entitled "Breathing Free" - A Treatment Guide for People with Asthma" Advised patient to track and manage asthma triggers;  Provided instruction about proper use of medications used for management of asthma including inhalers; both maintenance and rescue Advised patient to self assesses asthma action plan zone and make appointment with provider if in the yellow zone for 48 hours without improvement; Assessed social determinant of health barriers;   Smoking Cessation: (Status: Goal on track: NO.) - patient reports she is still smoking and has not had time to call health plan customer services to enroll in the smoking cessation program  as she is worried about being evicted from her  apartment Reviewed smoking history:  tobacco abuse of 17 years; currently smoking 1 ppd Previous quit attempts, unsuccessful 0; successful using 0  Denies smoking within 30 minutes of waking up Reports triggers to smoke include: stress, anxiety Reports motivation to quit smoking includes: living to see her children grow up On a scale of 1-10, reports MOTIVATION to quit is 9 On a scale of 1-10, reports CONFIDENCE in quitting is 5  Evaluation of current treatment plan reviewed; Advised patient to discuss smoking cessation options with provider; Provided contact information for [place] Quit Line (1-800-QUIT-NOW);  Provided patient with printed smoking cessation educational materials; Discussed plans with patient for ongoing care management follow up and provided patient with direct contact information for care management team; Provided health plan benefits related to smoking cessation  Patient Goals/Self-Care Activities: Patient will self administer medications as prescribed Patient will attend all scheduled provider appointments Patient will call pharmacy for medication refills Patient will investigate smoking cessation benefits offered by health plan Patient will continue to perform ADL's independently Patient will continue to perform IADL's independently Patient will call provider office for new concerns or questions Patient will work with BSW and pharmacist to address care coordination needs and will continue to work with the clinical team to address health care and disease management related needs.

## 2021-03-22 NOTE — Patient Outreach (Signed)
Medicaid Managed Care Social Work Note  03/22/2021 Name:  Leslie Yates MRN:  [id number] DOB:  [birth year]  Leslie Yates is an 30 y.o. year old female who is a primary patient of Leslie Sessions, NP.  The [facility] Managed Care Coordination team was consulted for assistance with:   housing  [name] was given information about [employer] team services today. Leslie Yates Patient agreed to services and verbal consent obtained.  Engaged with patient  for by telephone forfollow up visit in response to referral for case management and/or care coordination services.   Assessments/Interventions:  Review of past medical history, allergies, medications, health status, including review of consultants reports, laboratory and other test data, was performed as part of comprehensive evaluation and provision of chronic care management services.  SDOH: (Social Determinant of Health) assessments and interventions performed:  BSW followed up with patient. She stated she still has not heard anything from housing authority. BSW suggested patient go to housing authority to have childs SSN added. Patient stated she does have court on 03/28/21 and is still looking for someone to move just in case housing authority does not respond. Patient stated that she currenlty has transportation but her truck does need some work. She is unsure of how much longer it will last because she does not have the money to get it fixed.    Advanced Directives Status:  Not addressed in this encounter.  Care Plan                 Allergies  Allergen Reactions   Penicillins Rash    Childhood rash, Tolerated cefazolin (2021) Has patient had a PCN reaction causing immediate rash, facial/tongue/throat swelling, SOB or lightheadedness with hypotension: no Has patient had a PCN reaction causing severe rash involving mucus membranes or skin necrosis:no Has patient had a PCN reaction that required hospitalization  no Has patient had a PCN reaction occurring within the last 10 years: no If all of the above answers are "NO", then may proceed with Cephalosporin use.    Medications Reviewed Today     Reviewed by Leslie Richard, RN (Registered Nurse) on 03/21/21 at 1807  Med List Status: <None>   Medication Order Taking? Sig Documenting Provider Last Dose Status Informant  acetaminophen (TYLENOL) 325 MG tablet [id number] No Take 2 tablets (650 mg total) by mouth every 4 (four) hours as needed. Leslie Yates, CNM Taking Active   albuterol (VENTOLIN HFA) 108 (90 Base) MCG/ACT inhaler [id number] No Inhale 2 puffs into the lungs every 6 (six) hours as needed for wheezing or shortness of breath. Leslie Sessions, NP Taking Active   Blood Pressure Monitoring (BLOOD PRESSURE MONITOR AUTOMAT) DEVI [id number] No 1 Device by Does not apply route daily. Automatic blood pressure cuff regular/x-large size cuff. To monitor blood pressure regularly at home. ICD-10 code: O09.90. Leslie Phenix, MD Taking Active   clindamycin (CLEOCIN) 300 MG capsule [id number] No Take 300 mg by mouth 3 (three) times daily. [provider] Taking Active   [facility]HO[facility]GES[facility] 1 % ointment [id number] No  [provider] Taking Active   docusate sodium (COLACE) 100 MG capsule [id number] No Take 1 capsule (100 mg total) by mouth 2 (two) times daily. Leslie Maples, MD Taking Active   famotidine (PEPCID) 20 MG tablet [id number] No Take 20 mg by mouth 2 (two) times daily.  Patient not taking: No sig reported   [provider] Not Taking  Active   ferrous sulfate (FERROUSUL) 325 (65 FE) MG tablet [id number] No Take 1 tablet (325 mg total) by mouth every other day. Leslie Heritage, DO Taking Active   fluticasone (FLOVENT HFA) 110 MCG/ACT inhaler [id number] No Inhale 2 puffs into the lungs in the morning and at bedtime. Leslie Sessions, NP Taking Active            Med Note [name], [name] Feb 20, 2021  5:29 PM) Counseled on using twice daily not prn as she is currently doing  ibuprofen (ADVIL) 600 MG tablet [id number] No Take 1 tablet (600 mg total) by mouth every 6 (six) hours.  Patient not taking: No sig reported   [name], MD Not Taking Active   metoCLOPramide (REGLAN) 10 MG tablet [id number] No Take 10 mg by mouth 4 (four) times daily. [provider] Taking Active   NIFEdipine (PROCARDIA XL/NIFEDICAL XL) 60 MG 24 hr tablet [id number] No Take 60 mg by mouth daily. [provider] Taking Active   NIFEdipine (PROCARDIA XL/NIFEDICAL XL) 60 MG 24 hr tablet [id number]  Take 1 tablet (60 mg total) by mouth daily. [name], MD  Active   nortriptyline (PAMELOR) 25 MG capsule [id number] No Take 2 capsules every night  Patient not taking: No sig reported   [name], MD Not Taking Active            Med Note [name], [name] Feb 20, 2021  5:20 PM) States she never pick up from pharmacy  ondansetron (ZOFRAN-ODT) 4 MG disintegrating tablet [id number] No Take 4 mg by mouth every 8 (eight) hours as needed.  Patient not taking: No sig reported   [provider] Not Taking Active   Prenatal 27-1 MG TABS [id number] No Take 1 tablet by mouth daily. [name], CNM Taking Active   promethazine (PHENERGAN) 12.5 MG suppository [id number] No Place rectally. [provider] Taking Active   promethazine (PHENERGAN) 12.5 MG tablet [id number] No Take 2 tablets (25 mg total) by mouth every 6 (six) hours as needed for nausea or vomiting. [name], CNM Taking Active   SUMAtriptan (IMITREX) 100 MG tablet [id number] No Take 1 tablet (100 mg total) by mouth once as needed for up to 1 dose for migraine. May repeat in 2 hours if headache persists or recurs. Do not take more than 3 a week  Patient not taking: No sig reported   [name], MD Not Taking Active             Patient Active Problem List   Diagnosis Date Noted   Encounter  for postpartum visit 02/17/2021   H/O eclampsia 01/23/2021   IUP (intrauterine pregnancy), incidental 01/16/2021   Fetal cardiac anomaly affecting pregnancy, antepartum 10/20/2020   Hx of HELLP syndrome, currently pregnant, second trimester 09/16/2020   Hx of preterm delivery, currently pregnant, second trimester 09/16/2020   Supervision of high risk pregnancy, antepartum 05/23/2020   Seizure (HCC) 11/16/2019   Carrier of disease 07/01/2019   Sickle cell trait (HCC)    Obesity in pregnancy, antepartum 06/12/2019   Tobacco use 10/28/2018   Asthma 10/27/2018   Complex cyst of right ovary 05/10/2017   Nausea and vomiting during pregnancy 11/09/2016    Conditions to be addressed/monitored per PCP order:   housing  Care Plan : RN Care Manager Plan Of Care  Updates made by Shaune Leeks since 03/22/2021 12:00 AM  Problem: Chronic Disease Management and Care Coordination 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      Long-Range Goal: Development of Plan Of Care For Chronic Disease Management and Care Coordination Needs To Assist With Meeting Treatment Goals   Start Date: 02/20/2021  Expected End Date: 06/20/2021  Priority: High  Note:   Current Barriers:  Care Coordination needs related to Financial constraints related to limited income, Limited social support, Transportation, Housing barriers, Social Isolation, Lack of essential utilities - difficulty paying bills, and Lacks knowledge of community resource: related to health insurance benefits  Chronic Disease Management support and education needs related to Asthma and Post Partum Depression and smoking cessation Lacks caregiver support.  Corporate treasurer.  Transportation barriers Difficulty obtaining medications Knowledge deficit related to managed Medicaid Healthy Blue plan benefits Housing Insecurity- 03/21/21- patient reports another eviction court hearing has been scheduled for 03/28/21  RNCM Clinical Goal(s):   Patient will verbalize understanding of plan for management of asthma and post partum depression and smoking cessation take all medications exactly as prescribed and will call provider for medication related questions demonstrate understanding of rationale for each prescribed medication especially asthma maintenance medication attend all scheduled medical appointments: with ob/gyn and behavioral health counselor demonstrate improved adherence to prescribed treatment plan for asthma and post partum depression as evidenced by adherence to prescribed medication regimen contacting provider for new or worsened symptoms or questions and attending virtual counseling Yates continue to work with RN Care Manager to address care management and care coordination needs related to post partum depression and asthma and smoking cessation work with pharmacist to address Financial constraints related to limited income, Limited social support, Transportation, Housing barriers, Geneticist, molecular, and Lack of essential utilities - difficulty paying household bills, related to post partum depression and asthma and smoking cessation work with Child psychotherapist to Sports coach constraints related to limited income, Limited social support, English as a second language teacher, Housing barriers, Geneticist, molecular, and Lack of essential utilities - and difficulty paying household bills related to the management of post partum depression and asthma through collaboration with Medical illustrator, provider, and care team.   Interventions: Inter-disciplinary care team collaboration (see longitudinal plan of care) Evaluation of current treatment plan related to  self management and patient's adherence to plan as established by provider Mailed health education articles and [facility] Care Management spiral bound calendar with health education BSW spoke with patient, she stated she is trying to get in touch with someone from housing. Patient  states she is currently on maternity leave and her roommate has not paid her portion of the rent. Patient states an eviction has been filed and court is 03/16/21. Patient stated she has tried to go to a shelter but they are all full. Patient states she and her 2  children will not have anywhere to go if/once evicted. BSW contacted housing authority at [phone] and left a voicemail for [employer]. Patient stated no other resources are needed until she gets a stable environment for her 2 children. 03/21/21- Notified BSW Gus Puma that another eviction court hearing is scheduled for 03/28/21 03/22/21: BSW followed up with patient. She stated she still has not heard anything from housing authority. BSW suggested patient go to housing authority to have childs SSN added. Patient stated she does have court on 03/28/21 and is still looking for someone to move just in case housing authority does not respond. Patient stated that she currenlty has transportation but her truck does need some work. She  is unsure of how much longer it will last because she does not have the money to get it fixed.    Interdisciplinary Collaboration:  (Status: Goal on track: YES. 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 to address needs related to Financial constraints related to limited income, Limited social support, English as a second language teacher, Housing barriers, Geneticist, molecular, Limited access to caregiver, and Lacks knowledge of community resource: related to health plan benefits  in patient with  post partum depression and asthma  and smoking cessation Collaboration with [name], [name], NP regarding development and update of comprehensive plan of care as evidenced by provider attestation and co-signature Inter-disciplinary care team collaboration (see  longitudinal plan of care)  Asthma: (Status: Goal on track: YES.)- patient reports no recent  asthma exacerbations; states she received the health information in the mail but has not reviewed it yet as she remains very concerned that she is going to be evicted from her apartment Provided patient with basic written and verbal asthma education on self care/management/and exacerbation prevention; mailed booklet entitled "Breathing Free" - A Treatment Guide for People with Asthma" Advised patient to track and manage asthma triggers;  Provided instruction about proper use of medications used for management of asthma including inhalers; both maintenance and rescue Advised patient to self assesses asthma action plan zone and make appointment with provider if in the yellow zone for 48 hours without improvement; Assessed social determinant of health barriers;   Smoking Cessation: (Status: Goal on track: NO.) - patient reports she is still smoking and has not had time to call health plan customer services to enroll in the smoking cessation program as she is worried about being evicted from her apartment Reviewed smoking history:  tobacco abuse of 17 years; currently smoking 1 ppd Previous quit attempts, unsuccessful 0; successful using 0  Denies smoking within 30 minutes of waking up Reports triggers to smoke include: stress, anxiety Reports motivation to quit smoking includes: living to see her children grow up On a scale of 1-10, reports MOTIVATION to quit is 9 On a scale of 1-10, reports CONFIDENCE in quitting is 5  Evaluation of current treatment plan reviewed; Advised patient to discuss smoking cessation options with provider; Provided contact information for [place] [facility]uit [facility] (1-800-QUIT-NOW);  Provided patient with printed smoking cessation educational materials; Discussed plans with patient for ongoing care management follow up and provided patient with direct contact information for care management  team; Provided health plan benefits related to smoking cessation  Patient Goals/Self-Care Activities: Patient will self administer medications as prescribed Patient will attend all scheduled provider appointments Patient will call pharmacy for medication refills Patient will investigate smoking cessation benefits offered by health plan Patient will continue to perform ADL's independently Patient will continue to perform IADL's independently Patient will call provider office for new concerns or questions Patient will work with BSW and pharmacist to address care coordination needs and will continue to work with the clinical team to address health care and disease management related needs.         Follow up:  Patient agrees to Care Plan and Follow-up.  Plan: The Managed Medicaid care management team will reach out to the patient again over the next 5 days.  Date/time of next scheduled Social Work care management/care coordination outreach:  03/29/21  Gus Puma, BSW,  [facility] engineer Risk Managed Medicaid Team  [phone]

## 2021-03-28 ENCOUNTER — Ambulatory Visit: Payer: Medicaid Other | Admitting: Advanced Practice Midwife

## 2021-03-28 NOTE — Progress Notes (Deleted)
   GYNECOLOGY OFFICE PROCEDURE NOTE  Leslie Yates is a 30 y.o. [phone] [name]. No GYN concerns.  Last pap smear was on *** and was normal.  IUD Insertion Procedure Note Patient identified, informed consent performed, consent signed.   Discussed risks of irregular bleeding, cramping, infection, malpositioning or misplacement of the IUD outside the uterus which may require further procedure such as laparoscopy. Time out was performed.  Urine pregnancy test negative.  Speculum placed in the vagina.  Cervix visualized.  Cleaned with Betadine x 2.  Grasped anteriorly with a single tooth tenaculum.  Uterus sounded to *** cm.  [place] IUD placed per manufacturer's recommendations.  Strings trimmed to 3 cm. Tenaculum was removed, good hemostasis noted.  Patient tolerated procedure well.   Patient was given post-procedure instructions.  She was advised to have backup contraception for one week.  Patient was also asked to check IUD strings periodically and follow up in 4 weeks for IUD check.

## 2021-04-09 ENCOUNTER — Inpatient Hospital Stay: Admission: RE | Admit: 2021-04-09 | Payer: Medicaid Other | Source: Ambulatory Visit

## 2021-04-09 ENCOUNTER — Other Ambulatory Visit: Payer: Medicaid Other

## 2021-04-18 ENCOUNTER — Ambulatory Visit: Payer: Self-pay

## 2021-04-23 ENCOUNTER — Other Ambulatory Visit: Payer: Medicaid Other

## 2021-04-24 ENCOUNTER — Ambulatory Visit: Payer: Self-pay

## 2021-04-24 ENCOUNTER — Telehealth: Payer: Self-pay | Admitting: *Deleted

## 2021-04-24 NOTE — Patient Instructions (Signed)
[name] ,   The [facility] Managed Care Team is available to provide assistance to you with your healthcare needs at no cost and as a benefit of your [facility] plan. I'm sorry I was unable to reach you today for our scheduled appointment. Our care guide will call you to reschedule our telephone appointment. Please call me at the number below. I am available to be of assistance to you regarding your healthcare needs. .   Thank you,   Cranford Mon RN, CCM, CDCES [place]  [facility] Care Management Coordinator - Managed [place] High Risk [phone]

## 2021-04-24 NOTE — Patient Outreach (Signed)
Care Coordination  04/24/2021  Leslie Yates 1991/03/20 [id number]  04/24/2021 Name: Leslie Yates MRN: [id number] DOB: [birth year]  Referred by: [name], NP Reason for referral : High Risk Managed Medicaid (Unsuccessful RNCM follow  up telephone outreach)   An unsuccessful telephone outreach was attempted today. The patient was referred to the case management team for assistance with care management and care coordination.    Follow Up Plan: A HIPAA compliant phone message was left for the patient providing contact information and requesting a return call. and The Managed Medicaid care management team will reach out to the patient again over the next 7 days.    Cranford Mon RN, CCM, CDCES [place]  [facility] Care Management Coordinator - Managed [place] High Risk [phone]

## 2021-04-28 ENCOUNTER — Telehealth: Payer: Self-pay | Admitting: Primary Care

## 2021-04-28 NOTE — Telephone Encounter (Signed)
..   Medicaid Managed Care   Unsuccessful Outreach Note  04/28/2021 Name: Leslie Yates MRN: [id number] DOB: 15-Dec-1990  Referred by: [name], NP Reason for referral : High Risk Managed Medicaid (I called the patient today to get her phone visit with the [facility] RNCM rescheduled. I had to leave a  message on her VM.)   An unsuccessful telephone outreach was attempted today. The patient was referred to the case management team for assistance with care management and care coordination.   Follow Up Plan: The care management team will reach out to the patient again over the next 7 days.  Weston Settle Care Guide, High Risk Medicaid Managed Care Embedded Care Coordination [facility]  [facility]    . j

## 2021-05-12 ENCOUNTER — Other Ambulatory Visit: Payer: Medicaid Other

## 2021-05-12 ENCOUNTER — Inpatient Hospital Stay: Admission: RE | Admit: 2021-05-12 | Payer: Medicaid Other | Source: Ambulatory Visit

## 2021-05-30 ENCOUNTER — Other Ambulatory Visit: Payer: Self-pay | Admitting: *Deleted

## 2021-05-30 DIAGNOSIS — R569 Unspecified convulsions: Secondary | ICD-10-CM

## 2021-05-30 DIAGNOSIS — J4520 Mild intermittent asthma, uncomplicated: Secondary | ICD-10-CM

## 2021-05-30 NOTE — Patient Instructions (Signed)
Visit Information  Leslie Yates was given information about Medicaid Managed Care team care coordination services as a part of their Healthy Blue Medicaid benefit. Leslie Yates verbally consented to engagement with the [facility] Managed Care team.   If you are experiencing a medical emergency, please call 911 or report to your local emergency department or urgent care.   If you have a non-emergency medical problem during routine business hours, please contact your provider's office and ask to speak with a nurse.   For questions related to your Healthy [facility] health plan, please call: [phone] or visit the homepage here: [email or web address]  If you would like to schedule transportation through your Healthy [facility] plan, please call the following number at least 2 days in advance of your appointment: [phone]  Call the [facility] at [phone], at any time, 24 hours a day, 7 days a week. If you are in danger or need immediate medical attention call 911.  If you would like help to quit smoking, call 1-800-QUIT-NOW 725-074-7367) OR Espaol: 1-855-Djelo-Ya (4-970-263-7858) o para ms informacin haga clic aqu or Text READY to 200-400 to register via text  Leslie Yates - following are the goals we discussed in your visit today:   Goals Addressed             This Visit's Progress    COMPLETED: Begin and Stick with Counseling-Depression       Timeframe:  Long-Range Goal Priority:  High Start Date:     02/20/21                        Expected End Date:    ongoing                   Follow Up Date 04/18/21    - keep 90 percent of counseling appointments - schedule counseling appointment    Why is this important?   Beating depression may take some time.  If you don't feel better right away, don't give up on your treatment plan.    Notes: 05/30/21- Resolving due to duplicate goal      COMPLETED: Find Help in My  Community       Timeframe:  Long-Range Goal Priority:  High Start Date:  02/20/21                           Expected End Date:     ongoing                  Follow Up Date 04/18/21    - follow-up on any referrals for help I am given - think ahead to make sure my need does not become an emergency    Why is this important?   Knowing how and where to find help for yourself or family in your neighborhood and community is an important skill.  You will want to take some steps to learn how.    Notes: 05/30/21- Resolving due to duplicate goal      COMPLETED: Learn More About My Health       Timeframe:  Long-Range Goal Priority:  High Start Date:   02/20/21                          Expected End Date:   ongoing  Follow Up Date 04/18/21    - tell my story and reason for my visit - make a list of questions - ask questions - repeat what I heard to make sure I understand - bring a list of my medicines to the visit - speak up when I don't understand  -investigate smoking cessation benefits offered by my health plan   Why is this important?   The best way to learn about your health and care is by talking to the doctor and nurse.  They will answer your questions and give you information in the way that you like best.    Notes: 05/30/21- Resolving due to duplicate goal      COMPLETED: Make and Keep All Appointments       Timeframe:  Long-Range Goal Priority:  High Start Date:      02/20/21                       Expected End Date:   ongoing                    Follow Up Date 04/18/21    - arrange a ride through an agency 1 week before appointment - ask family or friend for a ride - call to cancel if needed - keep a calendar with appointment dates    Why is this important?   Part of staying healthy is seeing the doctor for follow-up care.  If you forget your appointments, there are some things you can do to stay on track.    Notes: 05/30/21- Resolving due to duplicate  goal          The patient verbalized understanding of instructions provided today and declined a print copy of patient instruction materials.   The Managed Medicaid care management team will reach out to the patient again over the next 30 days.   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      Long-Range Goal: Development of Plan Of Care For Chronic Disease Management and Care Coordination Needs To Assist With Meeting Treatment Goals for HTN, Asthma, depression and smoking cessation, and obesity   Start Date: 02/20/2021  Expected End Date: 02/20/2022  Priority: High  Note:   Current Barriers:  Care Coordination needs related to Financial constraints related to limited income, Limited social support, Transportation, Housing barriers, Social Isolation, Lack of essential utilities - difficulty paying bills, and Lacks knowledge of community resource: related to health insurance benefits  Chronic Disease Management support and education needs related to Asthma and Post Partum Depression and smoking cessation Lacks caregiver support. - Mom helps when she can but her health is impaired so she is not reliable, cannot afford child care Financial Constraints. - reports she needs a root canal and crown but she cancelled the dental appointment because it will cost $2300 Transportation barriers Difficulty obtaining medications- patient states she has all of her medications and takes them as prescribed Knowledge deficit related to managed Medicaid Healthy Blue plan benefits- states she used the one time utility bill assistance and the smoking cessation  counselor but  needs medication to help her quit Housing Insecurity- 05/30/21- patient states she was approved for an apartment at [facility] for her self and her 2 young daughters  RNCM Clinical Goal(s):  Patient will verbalize understanding of plan for management of asthma and post partum depression and smoking cessation take all medications exactly as prescribed and will call provider for medication related questions demonstrate understanding of rationale for each prescribed medication especially asthma maintenance medication attend all scheduled medical appointments: with ob/gyn and behavioral health counselor demonstrate improved adherence to prescribed treatment plan for asthma and post partum depression as evidenced by adherence to prescribed medication regimen contacting provider for new or worsened symptoms or questions and attending virtual counseling sessions continue to work with RN Care Manager to address care management and care coordination needs related to post partum depression and asthma and smoking cessation work with pharmacist to address Financial constraints related to limited income, Limited social support, Transportation, Housing barriers, Environmental consultant, and Lack of essential utilities - difficulty paying household bills, related to post partum depression and asthma and smoking cessation work with Education officer, museum to Civil [employer] fast streamer constraints related to limited income, Limited social support, Transport planner, Housing barriers, Environmental consultant, and Lack of essential utilities - and difficulty paying household bills related to the management of post partum depression and asthma through collaboration with Consulting civil engineer, provider, and care team.  Patient will work with the [employer] guides to address dental care payment assistance, [employer] and voc rehab   Interventions: Inter-disciplinary care team collaboration (see longitudinal plan of care) Evaluation  of current treatment plan related to  self management and patient's adherence to plan as established by provider Ensured patient received health information and calendar mailed to her. Provided opportunity for patient to ask questions.  Referral placed to community care guides for assistance with dental care payment assistance, [employer] and voc rehab Reviewed upcoming appointments and ensured she has transportation. Positive reinforcement given to patient for rescheduling her IUD insertion and MRI/MRA of brain Encouraged her to reschedule her appointment with the LCSW at her primary care providers office Referral sent to managed Medicaid LCSW Eula Fried to assist patient with securing counseling since she reports worsening depression and stress   Interdisciplinary Collaboration:  (Status: Goal on Track (progressing): YES.- )patient is working with [employer] and [employer]; states she has been approved for an apartment at [employer] and will move in when it is Lobbyist with BSW to initiate plan of care to address needs related to Financial constraints related to limited income, Limited social support, Transport planner, Housing barriers, Environmental consultant, Limited access to caregiver, and Lacks knowledge of community resource: related to health plan benefits  in patient with  post partum depression and asthma  and smoking cessation Collaboration with Oletta Lamas, Milford Cage, NP regarding development and update of comprehensive plan of care as evidenced by provider attestation and co-signature Inter-disciplinary care team collaboration (see longitudinal plan of care)  Asthma: (Status: Goal on track: YES.)- patient reports no recent  asthma exacerbations; states she received the health information in the mail and had no questions Reviewed mailed health information on asthma and on self care/management/and exacerbation prevention Advised patient to track and  manage asthma triggers; and ask primary care provider for peak flow meter to assist with asthma management Reviewed purpose of peak flow meter in managing asthma Assessed asthma exacerbation occurrences Reminded patient to self assesses asthma action plan zone and make  appointment with provider if in the yellow zone for 48 hours without improvement; Assessed social determinant of health barriers;   Smoking Cessation: (Status: Goal on track: NO.) - patient reports she is still smoking, is engaged with her health plan's [employer] Quit For Life  coach but feels she needs medication  Evaluation of current treatment plan reviewed; Assessed patient's engagement with smoking cessation benefit of her health plan Advised patient her health plan covers smoking cessation medications and encouraged her to discuss with her primary care provider to secure rx Discussed plans with patient for ongoing care management follow up and provided patient with direct contact information for care management team;   Hypertension Interventions:  (Status:  New goal.) Long Term Goal-   patient reported an episode of right sided chest pain on 12/5 that eventually resolved without treatment, she reports self monitored home BP readings are meeting treatment targets- most recent reading was 123/80 Last practice recorded BP readings:  BP Readings from Last 3 Encounters:  02/17/21 126/75  02/01/21 120/82  01/27/21 118/75  Most recent eGFR/CrCl: No results found for: EGFR  No components found for: CRCL  Evaluation of current treatment plan related to hypertension self management and patient's adherence to plan as established by provider Assessed frequency and values of self monitored blood pressure readings, reviewed treatment targets Provided verbal education to patient re: stroke prevention, s/s of heart attack and stroke, when to seek emergency treatment Reviewed medications with patient and discussed importance of  compliance Discussed plans with patient for ongoing care management follow up and provided patient with direct contact information for care management team Advised patient, providing education and rationale, to monitor blood pressure weekly and when she doesn't fell well and record, calling PCP for findings outside established parameters   Patient Goals/Self-Care Activities: Patient will self administer medications as prescribed Patient will attend all scheduled provider appointments Patient will reschedule counseling appointment with LCSW at her primary care providers office and work with the managed Medicaid LCSW for additional emotional support  Patient will call pharmacy for medication refills Patient will investigate smoking cessation benefits offered by health plan Patient will continue to perform ADL's independently Patient will continue to perform IADL's independently Patient will monitor her blood pressure weekly and when she doesn't fell well and record, and call her PCP for findings outside established parameters  Patient will call provider office for new concerns or questions Patient will work with BSW and pharmacist to address care coordination needs and will continue to work with the clinical team to address health care and disease management related needs.   Patient will work with the [employer] guides to address dental care payment assistance, [employer] and voc rehab

## 2021-05-30 NOTE — Patient Outreach (Signed)
Medicaid Managed Care   Nurse Care Manager Note  05/30/2021 Name:  Leslie Yates MRN:  [id number] DOB:  1991-05-27  Leslie Yates is an 30 y.o. year old female who is a primary patient of Leslie Yates.  The [facility] Managed Care Coordination team was consulted for assistance with:    HTN Anxiety Depression Tobacco Use Asthma Obesity  Leslie Yates was given information about [employer] team services today. [name] Patient agreed to services and verbal consent obtained.  Engaged with patient by telephone for follow up visit in response to provider referral for case management and/or care coordination services.   Assessments/Interventions:  Review of past medical history, allergies, medications, health status, including review of consultants reports, laboratory and other test data, was performed as part of comprehensive evaluation and provision of chronic care management services.  SDOH (Social Determinants of Health) assessments and interventions performed:   Care Plan  Allergies  Allergen Reactions   Penicillins Rash    Childhood rash, Tolerated cefazolin (2021) Has patient had a PCN reaction causing immediate rash, facial/tongue/throat swelling, SOB or lightheadedness with hypotension: no Has patient had a PCN reaction causing severe rash involving mucus membranes or skin necrosis:no Has patient had a PCN reaction that required hospitalization no Has patient had a PCN reaction occurring within the last 10 years: no If all of the above answers are "NO", then may proceed with Cephalosporin use.    Medications Reviewed Today     Reviewed by Leslie Ellison, RN (Registered Nurse) on 05/30/21 at [place] List Status: <None>   Medication Order Taking? Sig Documenting Provider Last Dose Status Informant  acetaminophen (TYLENOL) 325 MG tablet [id number] No Take 2 tablets (650 mg total) by mouth every 4 (four) hours as needed. Leslie Yates Taking Active   albuterol (VENTOLIN HFA) 108 (90 Base) MCG/ACT inhaler [id number] No Inhale 2 puffs into the lungs every 6 (six) hours as needed for wheezing or shortness of breath. Leslie Yates Taking Active   Blood Pressure Monitoring (BLOOD PRESSURE MONITOR AUTOMAT) DEVI [id number] No 1 Device by Does not apply route daily. Automatic blood pressure cuff regular/x-large size cuff. To monitor blood pressure regularly at home. ICD-10 code: O09.90. Leslie Yates Taking Active   clindamycin (CLEOCIN) 300 MG capsule [id number] No Take 300 mg by mouth 3 (three) times daily. Provider, Historical, Yates Taking Active   [facility]HO[facility]GES[facility] 1 % ointment [id number] No  Provider, Historical, Yates Taking Active   docusate sodium (COLACE) 100 MG capsule [id number] No Take 1 capsule (100 mg total) by mouth 2 (two) times daily. Leslie Flock, Yates Taking Active   famotidine (PEPCID) 20 MG tablet [id number] No Take 20 mg by mouth 2 (two) times daily.  Patient not taking: No sig reported   Provider, Historical, Yates Not Taking Active   ferrous sulfate (FERROUSUL) 325 (65 FE) MG tablet [id number] No Take 1 tablet (325 mg total) by mouth every other day. Leslie Mainland, DO Taking Active   fluticasone (FLOVENT HFA) 110 MCG/ACT inhaler [id number] No Inhale 2 puffs into the lungs in the morning and at bedtime. Leslie Yates Taking Active            Med Note Leslie Yates Feb 20, 2021  5:29 PM) Counseled on using twice daily not prn as she is currently doing  ibuprofen (ADVIL) 600 MG tablet [id number] No Take  1 tablet (600 mg total) by mouth every 6 (six) hours.  Patient not taking: No sig reported   Leslie Yates Not Taking Active   metoCLOPramide (REGLAN) 10 MG tablet [id number] No Take 10 mg by mouth 4 (four) times daily. Provider, Historical, Yates Taking Active   NIFEdipine (PROCARDIA XL/NIFEDICAL XL) 60 MG 24 hr tablet [id number] No Take 60 mg by mouth daily.  Provider, Historical, Yates Taking Active   NIFEdipine (PROCARDIA XL/NIFEDICAL XL) 60 MG 24 hr tablet [id number]  Take 1 tablet (60 mg total) by mouth daily. Leslie Yates  Active   nortriptyline (PAMELOR) 25 MG capsule [id number] No Take 2 capsules every night  Patient not taking: No sig reported   Leslie Yates Not Taking Active            Med Note Leslie Yates, Leslie Yates Feb 20, 2021  5:20 PM) States she never pick up from pharmacy  ondansetron (ZOFRAN-ODT) 4 MG disintegrating tablet [id number] No Take 4 mg by mouth every 8 (eight) hours as needed.  Patient not taking: No sig reported   Provider, Historical, Yates Not Taking Active   Prenatal 27-1 MG TABS [id number] No Take 1 tablet by mouth daily. Leslie Yates Taking Active   promethazine (PHENERGAN) 12.5 MG suppository [id number] No Place rectally. Provider, Historical, Yates Taking Active   promethazine (PHENERGAN) 12.5 MG tablet [id number] No Take 2 tablets (25 mg total) by mouth every 6 (six) hours as needed for nausea or vomiting. Leslie Yates Taking Active   SUMAtriptan (IMITREX) 100 MG tablet [id number] No Take 1 tablet (100 mg total) by mouth once as needed for up to 1 dose for migraine. May repeat in 2 hours if headache persists or recurs. Do not take more than 3 a week  Patient not taking: No sig reported   Leslie Yates Not Taking Active             Patient Active Problem List   Diagnosis Date Noted   Encounter for postpartum visit 02/17/2021   H/O eclampsia 01/23/2021   IUP (intrauterine pregnancy), incidental 01/16/2021   Fetal cardiac anomaly affecting pregnancy, antepartum 10/20/2020   Hx of HELLP syndrome, currently pregnant, second trimester 09/16/2020   Hx of preterm delivery, currently pregnant, second trimester 09/16/2020   Supervision of high risk pregnancy, antepartum 05/23/2020   Seizure ([place]) 11/16/2019   Carrier of disease 07/01/2019   Sickle cell trait ([place])    Obesity in pregnancy,  antepartum 06/12/2019   Tobacco use 10/28/2018   Asthma 10/27/2018   Complex cyst of right ovary 05/10/2017   Nausea and vomiting during pregnancy 11/09/2016    Care Plan : [place]  Updates made by Leslie Ellison, RN since 05/30/2021 12:00 AM     Problem: Chronic Disease Management and Care Coordination 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      Long-Range Goal: Development of Plan Of Care For Chronic Disease Management and Care Coordination Needs To Assist With Meeting Treatment Goals for Asthma, depression and smoking cessation   Start Date: 02/20/2021  Expected End Date: 02/20/2022  Priority: High  Note:   Current Barriers:  Care Coordination needs related to Financial constraints related to limited income, Limited social support, Transportation, Housing barriers, Social Isolation, Lack of essential utilities - difficulty paying bills, and Lacks knowledge of community resource: related to health insurance benefits  Chronic Disease Management support and education needs related  to Asthma and Post Partum Depression and smoking cessation Lacks caregiver support. - Mom helps when she can but her health is impaired so she is not reliable, cannot afford child care Financial Constraints. - reports she needs a root canal and crown but she cancelled the dental appointment because it will cost $2300 Transportation barriers Difficulty obtaining medications- patient states she has all of her medications and takes them as prescribed Knowledge deficit related to managed Medicaid Healthy Blue plan benefits- states she used the one time utility bill assistance and the smoking cessation counselor but needs medication to help her quit Housing Insecurity- 05/30/21- patient states she was approved for an apartment at [facility] for her self and her 2 young daughters  RNCM Clinical Goal(s):  Patient will verbalize understanding of plan for management of asthma and post  partum depression and smoking cessation take all medications exactly as prescribed and will call provider for medication related questions demonstrate understanding of rationale for each prescribed medication especially asthma maintenance medication attend all scheduled medical appointments: with ob/gyn and behavioral health counselor demonstrate improved adherence to prescribed treatment plan for asthma and post partum depression as evidenced by adherence to prescribed medication regimen contacting provider for new or worsened symptoms or questions and attending virtual counseling sessions continue to work with RN Care Manager to address care management and care coordination needs related to post partum depression and asthma and smoking cessation work with pharmacist to address Financial constraints related to limited income, Limited social support, Transportation, Housing barriers, Environmental consultant, and Lack of essential utilities - difficulty paying household bills, related to post partum depression and asthma and smoking cessation work with Education officer, museum to Civil Service fast streamer constraints related to limited income, Limited social support, Transport planner, Housing barriers, Environmental consultant, and Lack of essential utilities - and difficulty paying household bills related to the management of post partum depression and asthma through collaboration with Consulting civil engineer, provider, and care team.  Patient will work with the [employer] guides to address dental care payment assistance, [employer] and voc rehab   Interventions: Inter-disciplinary care team collaboration (see longitudinal plan of care) Evaluation of current treatment plan related to  self management and patient's adherence to plan as established by provider Ensured patient received health information and calendar mailed to her. Provided opportunity for patient to ask questions.  Referral placed to community care guides for assistance  with dental care payment assistance, [employer] and voc rehab Reviewed upcoming appointments and ensured she has transportation. Positive reinforcement given to patient for rescheduling her IUD insertion and MRI/MRA of brain Encouraged her to reschedule her appointment with the LCSW at her primary care providers office Referral sent to managed Medicaid LCSW Eula Fried to assist patient with securing counseling since she reports worsening depression and stress   Interdisciplinary Collaboration:  (Status: Goal on Track (progressing): YES.- )patient is working with [employer] and [employer]; states she has been approved for an apartment at [employer] and will move in when it is Lobbyist with BSW to initiate plan of care to address needs related to Financial constraints related to limited income, Limited social support, Transport planner, Housing barriers, Environmental consultant, Limited access to caregiver, and Lacks knowledge of community resource: related to health plan benefits  in patient with  post partum depression and asthma  and smoking cessation Collaboration with Oletta Lamas, Milford Cage, Yates regarding development and update of comprehensive plan of care as evidenced by provider  attestation and co-signature Inter-disciplinary care team collaboration (see longitudinal plan of care)  Asthma: (Status: Goal on track: YES.)- patient reports no recent  asthma exacerbations; states she received the health information in the mail and had no questions Reviewed mailed health information on asthma and on self care/management/and exacerbation prevention Advised patient to track and manage asthma triggers; and ask primary care provider for peak flow meter to assist with asthma management Reviewed purpose of peak flow meter in managing asthma Assessed asthma exacerbation occurrences Reminded patient to self assesses asthma action plan zone and make appointment with  provider if in the yellow zone for 48 hours without improvement; Assessed social determinant of health barriers;   Smoking Cessation: (Status: Goal on track: NO.) - patient reports she is still smoking, is engaged with her health plan's [employer] Quit For Life  coach but feels she needs medication  Evaluation of current treatment plan reviewed; Assessed patient's engagement with smoking cessation benefit of her health plan Advised patient her health plan covers smoking cessation medications and encouraged her to discuss with her primary care provider to secure rx Discussed plans with patient for ongoing care management follow up and provided patient with direct contact information for care management team;   Hypertension Interventions:  (Status:  New goal.) Long Term Goal- patient reported an episode of right sided chest pain on 12/5 that eventually resolved without treatment, she reports self monitored home BP readings are meeting treatment targets- most recent reading was 123/80 Last practice recorded BP readings:  BP Readings from Last 3 Encounters:  02/17/21 126/75  02/01/21 120/82  01/27/21 118/75  Most recent eGFR/CrCl: No results found for: EGFR  No components found for: CRCL  Evaluation of current treatment plan related to hypertension self management and patient's adherence to plan as established by provider Assessed frequency and values of self monitored blood pressure readings, reviewed treatment targets Provided verbal education to patient re: stroke prevention, s/s of heart attack and stroke, when to seek emergency treatment Reviewed medications with patient and discussed importance of compliance Discussed plans with patient for ongoing care management follow up and provided patient with direct contact information for care management team Advised patient, providing education and rationale, to monitor blood pressure weekly and when she doesn't fell well and record, calling PCP for  findings outside established parameters   Patient Goals/Self-Care Activities: Patient will self administer medications as prescribed Patient will attend all scheduled provider appointments Patient will reschedule counseling appointment with LCSW at her primary care providers office and work with the managed Medicaid LCSW for additional emotional support  Patient will call pharmacy for medication refills Patient will investigate smoking cessation benefits offered by health plan Patient will continue to perform ADL's independently Patient will continue to perform IADL's independently Patient will monitor her blood pressure weekly and when she doesn't fell well and record, and call her PCP for findings outside established parameters  Patient will call provider office for new concerns or questions Patient will work with BSW and pharmacist to address care coordination needs and will continue to work with the clinical team to address health care and disease management related needs.   Patient will work with the [employer] guides to address dental care payment assistance, [employer] and voc rehab        Follow Up:  Patient agrees to Care Plan and Follow-up.  Plan: The Managed Medicaid care management team will reach out to the patient again over the next 30 days.  Date/time of next scheduled RN care management/care coordination outreach:  06/27/21 at 3:30 pm  Kelli Churn RN, CCM, [place] Management Coordinator - Managed [place] High Risk [phone]

## 2021-05-31 ENCOUNTER — Other Ambulatory Visit: Payer: Self-pay

## 2021-05-31 ENCOUNTER — Ambulatory Visit: Payer: Medicaid Other | Admitting: Obstetrics and Gynecology

## 2021-05-31 DIAGNOSIS — Z30430 Encounter for insertion of intrauterine contraceptive device: Secondary | ICD-10-CM

## 2021-06-01 ENCOUNTER — Telehealth: Payer: Self-pay | Admitting: Primary Care

## 2021-06-01 DIAGNOSIS — Z30430 Encounter for insertion of intrauterine contraceptive device: Secondary | ICD-10-CM | POA: Insufficient documentation

## 2021-06-01 NOTE — Progress Notes (Signed)
Pt not seen due to power outage  Donavan Foil, MD

## 2021-06-01 NOTE — Telephone Encounter (Signed)
..   Medicaid Managed Care   Unsuccessful Outreach Note  06/01/2021 Name: Leslie Yates MRN: [id number] DOB: 23-Jul-1990  Referred by: [name], NP Reason for referral : High Risk Managed Medicaid (I called the patient today to get her scheduled for a phone visit with the MM LCSW. I left my name and number on her VM.)   An unsuccessful telephone outreach was attempted today. The patient was referred to the case management team for assistance with care management and care coordination.   Follow Up Plan: The care management team will reach out to the patient again over the next 7 days.   Weston Settle Care Guide, High Risk Medicaid Managed Care Embedded Care Coordination [facility]  [facility]

## 2021-06-03 DIAGNOSIS — L732 Hidradenitis suppurativa: Secondary | ICD-10-CM | POA: Diagnosis not present

## 2021-06-06 ENCOUNTER — Other Ambulatory Visit: Payer: Self-pay

## 2021-06-06 NOTE — Patient Instructions (Signed)
Leslie Yates ,   The [facility] Managed Care Team is available to provide assistance to you with your healthcare needs at no cost and as a benefit of your [facility] Health plan. I'm sorry I was unable to reach you today for our scheduled appointment. Our care guide will call you to reschedule our telephone appointment. Please call me at the number below. I am available to be of assistance to you regarding your healthcare needs. .   Thank you,   Dickie La, BSW, MSW, LCSW Managed Medicaid LCSW [facility]   [email or web address] [email or web address] Phone: [phone]

## 2021-06-06 NOTE — Patient Outreach (Signed)
[facility]) Care Management  06/06/2021  Leslie Yates 02-13-1991 [id number]  LCSW completed [facility] outreach attempt today during scheduled appointment time but was unable to reach patient successfully. A HIPPA compliant voice message was left encouraging patient to return call once available. LCSW will ask Scheduling Care Guide to reschedule [facility] SW appointment with patient as well.  Leslie Yates, BSW, MSW, [employer] Managed Medicaid LCSW [facility]   [email or web address] Phone: [phone]

## 2021-06-08 ENCOUNTER — Ambulatory Visit: Payer: Medicaid Other | Admitting: Neurology

## 2021-06-08 ENCOUNTER — Other Ambulatory Visit: Payer: Self-pay

## 2021-06-08 ENCOUNTER — Encounter: Payer: Self-pay | Admitting: Neurology

## 2021-06-08 VITALS — BP 128/70 | HR 57 | Ht 72.0 in | Wt 250.2 lb

## 2021-06-08 DIAGNOSIS — F445 Conversion disorder with seizures or convulsions: Secondary | ICD-10-CM | POA: Diagnosis not present

## 2021-06-08 DIAGNOSIS — G43909 Migraine, unspecified, not intractable, without status migrainosus: Secondary | ICD-10-CM
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
## 2021-06-08 NOTE — Patient Instructions (Signed)
Good to see you doing better! You can cancel the MRI scheduled for tomorrow, you do not need it done since you already did it 4 months ago. Follow-up in 1 year, call for any changes

## 2021-06-09 ENCOUNTER — Inpatient Hospital Stay: Admission: RE | Admit: 2021-06-09 | Payer: Medicaid Other | Source: Ambulatory Visit

## 2021-06-09 ENCOUNTER — Other Ambulatory Visit: Payer: Medicaid Other

## 2021-06-13 ENCOUNTER — Telehealth: Payer: Self-pay | Admitting: *Deleted

## 2021-06-13 ENCOUNTER — Other Ambulatory Visit: Payer: Self-pay | Admitting: Licensed Clinical Social Worker

## 2021-06-13 DIAGNOSIS — F32A Depression, unspecified: Secondary | ICD-10-CM

## 2021-06-13 NOTE — Patient Outreach (Addendum)
Medicaid Managed Care Social Work Note  06/13/2021 Name:  Leslie Yates MRN:  [id number] DOB:  [birth year]  Leslie Yates is an 30 y.o. year old female who is a primary patient of Leslie Sessions, NP.  The Medicaid Managed Care Coordination team was consulted for assistance with:  Mental Health Counseling and Resources  Ms. Kromah was given information about [employer] team services today. Leslie Yates Patient agreed to services and verbal consent obtained.  Engaged with patient  for by telephone forinitial visit in response to referral for case management and/or care coordination services.   Assessments/Interventions:  Review of past medical history, allergies, medications, health status, including review of consultants reports, laboratory and other test data, was performed as part of comprehensive evaluation and provision of chronic care management services.  SDOH: (Social Determinant of Health) assessments and interventions performed:  SDOH Interventions    Flowsheet Row Most Recent Value  SDOH Interventions   Stress Interventions Provide Counseling, Offered Community Wellness Resources        SDOH Screenings   Alcohol Screen: Not on file  Depression (PHQ2-9): Medium Risk   PHQ-2 Score: 14  Financial Resource Strain: Not on file  Food Insecurity: Food Insecurity Present   Worried About Programme researcher, broadcasting/film/video in the Last Year: Sometimes true   Barista in the Last Year: Sometimes true  Housing: Medium Risk   Last Housing Risk Score: 1  Physical Activity: Not on file  Social Connections: Not on file  Stress: Stress Concern Present   Feeling of Stress : To some extent  Tobacco Use: High Risk   Smoking Tobacco Use: Every Day   Smokeless Tobacco Use: Never   Passive Exposure: Not on file  Transportation Needs: Unmet Transportation Needs   Lack of Transportation (Medical): Yes   Lack of Transportation (Non-Medical): Yes      Advanced  Directives Status:  See Care Plan for related entries.  Care Plan                 Allergies  Allergen Reactions   Penicillins Rash    Childhood rash, Tolerated cefazolin (2021) Has patient had a PCN reaction causing immediate rash, facial/tongue/throat swelling, SOB or lightheadedness with hypotension: no Has patient had a PCN reaction causing severe rash involving mucus membranes or skin necrosis:no Has patient had a PCN reaction that required hospitalization no Has patient had a PCN reaction occurring within the last 10 years: no If all of the above answers are "NO", then may proceed with Cephalosporin use.    Medications Reviewed Today     Reviewed by [name], MD (Physician) on 06/08/21 at 1114  Med List Status: <None>   Medication Order Taking? Sig Documenting Provider Last Dose Status Informant  acetaminophen (TYLENOL) 325 MG tablet [id number] Yes Take 2 tablets (650 mg total) by mouth every 4 (four) hours as needed. [name], CNM Taking Active   albuterol (VENTOLIN HFA) 108 (90 Base) MCG/ACT inhaler [id number] Yes Inhale 2 puffs into the lungs every 6 (six) hours as needed for wheezing or shortness of breath. Leslie Sessions, NP Taking Active   Blood Pressure Monitoring (BLOOD PRESSURE MONITOR AUTOMAT) DEVI [id number] Yes 1 Device by Does not apply route daily. Automatic blood pressure cuff regular/x-large size cuff. To monitor blood pressure regularly at home. ICD-10 code: O09.90. Leslie Phenix, MD Taking Active   doxycycline (VIBRAMYCIN) 100 MG capsule [id number] Yes Take 100 mg by  mouth 2 (two) times daily. [provider] Taking Active   ferrous sulfate (FERROUSUL) 325 (65 FE) MG tablet [id number] No Take 1 tablet (325 mg total) by mouth every other day.  Patient not taking: Reported on 06/08/2021   Leslie Heritage, DO Not Taking Active   fluticasone [facility] HFA) 110 MCG/ACT inhaler [id number] Yes Inhale 2 puffs into the lungs in the morning  and at bedtime. Leslie Sessions, NP Taking Active            Med Note [name], Leslie Yates Feb 20, 2021  5:29 PM) Counseled on using twice daily not prn as she is currently doing  ibuprofen (ADVIL) 600 MG tablet [id number] Yes Take 1 tablet (600 mg total) by mouth every 6 (six) hours. [name], MD Taking Active   NIFEdipine (PROCARDIA XL/NIFEDICAL XL) 60 MG 24 hr tablet [id number] No Take 60 mg by mouth daily.  Patient not taking: Reported on 06/08/2021   [provider] Not Taking Active   nortriptyline (PAMELOR) 25 MG capsule [id number] No Take 2 capsules every night  Patient not taking: Reported on 02/17/2021   [name], MD Not Taking Active            Med Note [name], Leslie Yates Feb 20, 2021  5:20 PM) States she never pick up from pharmacy  Polyethylene Glycol 3350 (MIRALAX PO) [id number] Yes Take by mouth. [provider] Taking Active   SUMAtriptan (IMITREX) 100 MG tablet [id number] Yes Take 1 tablet (100 mg total) by mouth once as needed for up to 1 dose for migraine. May repeat in 2 hours if headache persists or recurs. Do not take more than 3 a week Leslie Yates, Lesle Chris, MD Taking Active             Patient Active Problem List   Diagnosis Date Noted   Encounter for insertion of progestin-releasing intrauterine contraceptive device (IUD) 06/01/2021   Encounter for postpartum visit 02/17/2021   H/O eclampsia 01/23/2021   IUP (intrauterine pregnancy), incidental 01/16/2021   Fetal cardiac anomaly affecting pregnancy, antepartum 10/20/2020   Hx of HELLP syndrome, currently pregnant, second trimester 09/16/2020   Hx of preterm delivery, currently pregnant, second trimester 09/16/2020   Supervision of high risk pregnancy, antepartum 05/23/2020   Seizure (HCC) 11/16/2019   Carrier of disease 07/01/2019   Sickle cell trait (HCC)    Obesity in pregnancy, antepartum 06/12/2019   Tobacco use 10/28/2018   Asthma 10/27/2018   Complex cyst of right  ovary 05/10/2017   Nausea and vomiting during pregnancy 11/09/2016    Conditions to be addressed/monitored per PCP order:  Depression  Timeframe:  Long-Range Goal Priority:  High Start Date:   06/13/21                   Expected End Date:  ongoing                     Follow Up Date-07/10/21   - check out counseling - keep 90 percent of counseling appointments - schedule counseling appointment    Why is this important?             Beating depression may take some time.            If you don't feel better right away, don't give up on your treatment plan.    Current barriers:  Chronic Mental Health needs related to depression, stress and caregiver strain           Mental Health Concerns            Needs Support, Education, and Care Coordination in order to meet unmet mental health needs. Clinical Goal(s): demonstrate a reduction in symptoms related to :Depression, connect with provider for ongoing mental health treatment.  , and increase coping skills, healthy habits, self-management skills, and stress reduction     Clinical Interventions:            Assessed patient's previous and current treatment, coping skills, support system and barriers to care  ?         Depression screen reviewed  ?         Solution-Focused Strategies ?         Mindfulness or Relaxation Training ?         Active listening / Reflection utilized  ?         Emotional Supportive Provided ?         Behavioral Activation ?         Participation in counseling encouraged  ?         Verbalization of feelings encouraged  ?         Crisis Resource Education / information provided  ?         Suicidal Ideation/Homicidal Ideation assessed: ?         Discussed Health Care Power of Attorney  ?         Discussed referral for counseling           Review various resources, discussed options and provided patient information about  ?         Options for mental health treatment based on need and insurance            Inter-disciplinary care team collaboration (see longitudinal plan of care)           LCSW discussed coping skills for anxiety and depression. SW used empathetic and active and reflective listening, validated feelings/concerns, and provided emotional support. LCSW provided self-care education to help manage her mental health conditions and improve her mood.            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 Patient is experiencing signs of depression due to this life transition and would like counseling and medication management. Referral made to [facility] on 06/13/21 for both services. She was encouraged to consider their walk in clinic if needed.           Patient denies any current crises or urgent needs Patient Goals/Self-Care Activities: Over the next 120 days           Call your insurance provider for more information about your Enhanced Benefits      24- Hour Availability:    [facility]  [address] [place], [place] [phone] Crisis [phone]   [facility] [phone]   [facility]  [phone]    [facility]  [phone] (after hours)   [facility]rapeutic Alternative/Mobile Crisis   [phone]   [place] National Suicide Hotline  (940)578-4311 (TALK) OR 988   Call 911 or go to emergency room   [facility]  [phone]);  [place] and [employer]  [phone]); [place], [place], [place], [place], [place], [place], [place]           Follow up:  Patient agrees to Care Plan and Follow-up.  Plan: The Managed Medicaid care management team will reach out to the patient again over the next 30 days.  Date/time  of next scheduled Social Work care management/care coordination outreach:  07/10/21 at 11 am  Dickie La, BSW, MSW, [employer] Managed Medicaid LCSW [facility]   [facility] [employer] [email or web address] Phone: [phone]

## 2021-06-13 NOTE — Telephone Encounter (Signed)
° °  Telephone encounter was:  Unsuccessful.  06/13/2021 Name: Leslie Yates MRN: [id number] DOB: December 07, 1990  Unsuccessful outbound call made today to assist with:  Transportation Needs , Food Insecurity, and other assitance needed   Outreach Attempt:  1st Attempt  A HIPAA compliant voice message was left requesting a return call.  Instructed patient to call back at   Instructed patient to call back at [phone]  at their earliest convenience. Yehuda Mao Greenauer -[facility] Guide , Embedded Care Coordination [facility], Care Management  [phone] [address] [place] , [place] [address] Email : [email or web address]

## 2021-06-14 NOTE — Patient Instructions (Signed)
Visit Information  Ms. Michelini was given information about Medicaid Managed Care team care coordination services as a part of their Healthy [employer] Medicaid benefit. [name] verbally consented to engagement with the [facility] Managed Care team.   If you are experiencing a medical emergency, please call 911 or report to your local emergency department or urgent care.   If you have a non-emergency medical problem during routine business hours, please contact your provider's office and ask to speak with a nurse.   For questions related to your Healthy [facility] health plan, please call: [phone] or visit the homepage here: [email or web address]  If you would like to schedule transportation through your Healthy [facility] plan, please call the following number at least 2 days in advance of your appointment: [phone]  Call the [facility] Crisis Line at [phone], at any time, 24 hours a day, 7 days a week. If you are in danger or need immediate medical attention call 911.  If you would like help to quit smoking, call 1-800-QUIT-NOW (6133666524) OR Espaol: 1-855-Djelo-Ya ([phone]) o para ms informacin haga clic aqu or Text READY to 726-203 to register via text   Following is a copy of your plan of care:  Care Plan : LCSW Plan of Care  Updates made by Gustavus Bryant, LCSW since 06/14/2021 12:00 AM     Problem: Coping Skills (General Plan of Care)      Goal: Coping Skills Enhanced   Note:   Timeframe:  Long-Range Goal Priority:  High Start Date:   06/13/21                   Expected End Date:  ongoing                     Follow Up Date-07/10/21   - check out counseling - keep 90 percent of counseling appointments - schedule counseling appointment    Why is this important?             Beating depression may take some time.            If you don't feel better right away, don't give up on your treatment plan.     Current barriers:             Chronic Mental Health needs related to depression, stress and caregiver strain           Mental Health Concerns            Needs Support, Education, and Care Coordination in order to meet unmet mental health needs. Clinical Goal(s): demonstrate a reduction in symptoms related to :Depression, connect with provider for ongoing mental health treatment.  , and increase coping skills, healthy habits, self-management skills, and stress reduction  Dickie La, BSW, MSW, [employer] Managed Medicaid LCSW [facility]   [facility] [email or web address] Phone: [phone]

## 2021-06-15 ENCOUNTER — Ambulatory Visit: Payer: Medicaid Other | Admitting: Obstetrics and Gynecology

## 2021-06-15 ENCOUNTER — Other Ambulatory Visit: Payer: Self-pay

## 2021-06-15 ENCOUNTER — Telehealth: Payer: Self-pay | Admitting: Clinical

## 2021-06-15 VITALS — BP 125/78 | HR 68 | Wt 250.9 lb

## 2021-06-15 DIAGNOSIS — Z975 Presence of (intrauterine) contraceptive device: Secondary | ICD-10-CM

## 2021-06-15 DIAGNOSIS — Z5941 Food insecurity: Secondary | ICD-10-CM

## 2021-06-15 DIAGNOSIS — Z3202 Encounter for pregnancy test, result negative: Secondary | ICD-10-CM | POA: Diagnosis not present

## 2021-06-15 DIAGNOSIS — Z30430 Encounter for insertion of intrauterine contraceptive device: Secondary | ICD-10-CM

## 2021-06-15 DIAGNOSIS — Z1331 Encounter for screening for depression: Secondary | ICD-10-CM

## 2021-06-15 LAB — POCT PREGNANCY, URINE: Preg Test, Ur: NEGATIVE

## 2021-06-15 MED ORDER — IBUPROFEN 800 MG PO TABS
800.0000 mg | ORAL_TABLET | Freq: Once | ORAL | Status: AC
  Administered 2021-06-15: 16:00:00 800 mg via ORAL

## 2021-06-15 MED ORDER — IBUPROFEN 800 MG PO TABS: 800.0000 mg | ORAL_TABLET | Freq: Once | ORAL | Status: DC

## 2021-06-15 MED ORDER — LEVONORGESTREL 20.1 MCG/DAY IU IUD
1.0000 | INTRAUTERINE_SYSTEM | Freq: Once | INTRAUTERINE | Status: AC
  Administered 2021-06-15: 16:00:00 1 via INTRAUTERINE

## 2021-06-15 NOTE — Addendum Note (Signed)
Addended by: Cline Crock on: 06/15/2021 04:56 PM   Modules accepted: Orders

## 2021-06-15 NOTE — Telephone Encounter (Signed)
Attempt to schedule, per referral; MyChart message left for pt, as agreed-upon; pt will call back to schedule virtual visit when she knows her work schedule for a new job starting first week January.

## 2021-06-15 NOTE — Progress Notes (Signed)
ibuprofen   GYNECOLOGY OFFICE PROCEDURE NOTE  KEALA DRUM is a 30 y.o. [phone] [name]. No GYN concerns.  Last pap smear was on 12/24/19 and was normal.  IUD Insertion Procedure Note Patient identified, informed consent performed, consent signed.   Discussed risks of irregular bleeding, cramping, infection, malpositioning or misplacement of the IUD outside the uterus which may require further procedure such as laparoscopy. Also discussed >99% contraception efficacy, increased risk of ectopic pregnancy with failure of method.  Time out was performed.  Urine pregnancy test negative.  Speculum placed in the vagina.  Cervix visualized.  Cleaned with Betadine x 2.  Grasped anteriorly with a single tooth tenaculum.  Uterus sounded to 9 cm.  Mirena IUD placed per manufacturer's recommendations.  Strings trimmed to 3 cm. Tenaculum was removed, good hemostasis noted.  Patient tolerated procedure well.   Patient was given post-procedure instructions.  She was advised to have backup contraception for one week.  Patient was also asked to check IUD strings periodically and follow up in 4 weeks for IUD check.   Mariel Aloe, MD, FACOG Obstetrician & Gynecologist, [facility], [facility]

## 2021-06-22 ENCOUNTER — Encounter: Payer: Medicaid Other | Admitting: Licensed Clinical Social Worker

## 2021-06-27 ENCOUNTER — Other Ambulatory Visit: Payer: Self-pay | Admitting: *Deleted

## 2021-06-27 NOTE — Patient Outreach (Signed)
Care Coordination  06/27/2021  HAVYNN CASTERLINE February 23, 1991 [id number]  Reached patient successfully with purpose of completing follow up assessment, however patient states she is unavailable because she is in orientation for a new job. Requests this RNCM return call later in week.   Plan: With patient's agreement, follow up assessment appointment rescheduled to 06/30/21 at 11:45 am.  Kelli Churn RN, CCM, [place] Management Coordinator - Managed [place] High Risk [phone]

## 2021-06-30 ENCOUNTER — Ambulatory Visit: Payer: Self-pay

## 2021-06-30 ENCOUNTER — Telehealth: Payer: Self-pay | Admitting: *Deleted

## 2021-06-30 NOTE — Patient Outreach (Signed)
Care Coordination  06/30/2021  Leslie Yates March 18, 1991 [id number]  06/30/2021 Name: Leslie Yates MRN: [id number] DOB: 1990-10-05  Referred by: [name], NP Reason for referral : High Risk Managed Medicaid (Unsuccessful RNCM follow up telephone outreach)   An unsuccessful telephone outreach was attempted today. The patient was referred to the case management team for assistance with care management and care coordination.    Follow Up Plan: A HIPAA compliant phone message was left for the patient providing contact information and requesting a return call. and The Managed Medicaid care management team will reach out to the patient again over the next 7 days.    Cranford Mon RN, CCM, CDCES [place]   [facility] Care Management Coordinator - Managed [place] High Risk [phone]

## 2021-06-30 NOTE — Patient Instructions (Signed)
Braylon J Iannelli ,   The Medicaid Managed Care Team is available to provide assistance to you with your healthcare needs at no cost and as a benefit of your Medicaid Health plan. I'm sorry I was unable to reach you today for our scheduled appointment. Our care guide will call you to reschedule our telephone appointment. Please call me at the number below. I am available to be of assistance to you regarding your healthcare needs. .   Thank you,   Theressa Piedra RN, CCM, CDCES [place]  [facility] Care Management Coordinator - Managed Medicaid High Risk [phone]   

## 2021-07-03 ENCOUNTER — Telehealth: Payer: Self-pay | Admitting: *Deleted

## 2021-07-03 NOTE — Telephone Encounter (Signed)
° °  Telephone encounter was:  Unsuccessful.  07/03/2021 Name: Leslie Yates MRN: [id number] DOB: 1990-06-28  Unsuccessful outbound call made today to assist with:   job assistance  Outreach Attempt:  2nd Attempt  A HIPAA compliant voice message was left requesting a return call.  Instructed patient to call back at   Instructed patient to call back at [phone]  at their earliest convenience.   Alois Cliche -[facility] Guide , Embedded Care Coordination [facility], Care Management  [phone] [address] [place] , [place] [address] Email : [email or web address]

## 2021-07-04 ENCOUNTER — Other Ambulatory Visit (HOSPITAL_COMMUNITY)
Admission: RE | Admit: 2021-07-04 | Discharge: 2021-07-04 | Disposition: A | Discharge status: Home / Self Care | Payer: Medicaid Other | Source: Ambulatory Visit | Attending: Family Medicine | Admitting: Family Medicine

## 2021-07-04 ENCOUNTER — Other Ambulatory Visit: Payer: Self-pay

## 2021-07-04 ENCOUNTER — Ambulatory Visit (INDEPENDENT_AMBULATORY_CARE_PROVIDER_SITE_OTHER): Payer: Medicaid Other

## 2021-07-04 ENCOUNTER — Telehealth: Payer: Self-pay | Admitting: General Practice

## 2021-07-04 VITALS — BP 113/72 | HR 63 | Wt 252.4 lb

## 2021-07-04 DIAGNOSIS — R300 Dysuria: Secondary | ICD-10-CM | POA: Diagnosis not present

## 2021-07-04 DIAGNOSIS — R350 Frequency of micturition: Secondary | ICD-10-CM

## 2021-07-04 DIAGNOSIS — N898 Other specified noninflammatory disorders of vagina: Secondary | ICD-10-CM | POA: Diagnosis not present

## 2021-07-04 LAB — POCT URINALYSIS DIP (DEVICE)
Bilirubin Urine: NEGATIVE
Glucose, UA: NEGATIVE mg/dL
Ketones, ur: NEGATIVE mg/dL
Nitrite: NEGATIVE
Protein, ur: NEGATIVE mg/dL
Specific Gravity, Urine: 1.025 (ref 1.005–1.030)
Urobilinogen, UA: 0.2 mg/dL (ref 0.0–1.0)
pH: 7 (ref 5.0–8.0)

## 2021-07-04 NOTE — Progress Notes (Signed)
Patient seen today with concern for malodorous discharge, burning with urination, incomplete emptying of bladder and frequency with urination. Patient educated on how to collect self swab. Explained to patient that she will be notified with any abnormal results. Patient denies any other questions or concerns.   Paulina Fusi, RN 07/04/21

## 2021-07-04 NOTE — Telephone Encounter (Signed)
Patient called into front office wanting to speak with a nurse regarding concerning symptoms she has been having since her IUD insertion. Patient is unsure if she needs an emergency appt.  Patient states she since her IUD insertion she has been bleeding. She reports bleeding is sometimes like a period and other times spotting but it has been every day since insertion. Also reports off/on dysuria, incomplete emptying, frequency, & vaginal odor x 1 week. Discussed with patient bleeding is normal and not concerning. Also discussed coming in for nurse visit for self swab & UA to check for vaginal infections and UTI. Patient verbalized understanding & I added her to nurse schedule for today at 330.

## 2021-07-05 LAB — CERVICOVAGINAL ANCILLARY ONLY
Bacterial Vaginitis (gardnerella): POSITIVE — AB
Candida Glabrata: NEGATIVE
Candida Vaginitis: NEGATIVE
Chlamydia: NEGATIVE
Comment: NEGATIVE
Comment: NEGATIVE
Comment: NEGATIVE
Comment: NEGATIVE
Comment: NEGATIVE
Comment: NORMAL
Neisseria Gonorrhea: NEGATIVE
Trichomonas: POSITIVE — AB

## 2021-07-06 ENCOUNTER — Other Ambulatory Visit: Payer: Self-pay | Admitting: Family Medicine

## 2021-07-06 ENCOUNTER — Encounter: Payer: Self-pay | Admitting: Family Medicine

## 2021-07-06 ENCOUNTER — Telehealth: Payer: Self-pay | Admitting: *Deleted

## 2021-07-06 DIAGNOSIS — A5901 Trichomonal vulvovaginitis: Secondary | ICD-10-CM

## 2021-07-06 HISTORY — DX: Trichomonal vulvovaginitis: A59.01

## 2021-07-06 LAB — URINE CULTURE

## 2021-07-06 MED ORDER — METRONIDAZOLE 500 MG PO TABS: 500.0000 mg | ORAL_TABLET | Freq: Two times a day (BID) | ORAL | 0 refills | Status: AC

## 2021-07-06 NOTE — Telephone Encounter (Signed)
° °  Telephone encounter was:  Unsuccessful.  07/06/2021 Name: Leslie Yates MRN: [id number] DOB: [birth year]  Unsuccessful outbound call made today to assist with:  Transportation Needs  and Food Insecurity  Outreach Attempt:  3rd Attempt.  Referral closed unable to contact patient.  A HIPAA compliant voice message was left requesting a return call.  Instructed patient to call back at   Instructed patient to call back at [phone]  at their earliest convenience. Yehuda Mao Greenauer -[facility] Guide , Embedded Care Coordination [facility], Care Management  [phone] [address] [place] , [place] [address] Email : [email or web address]

## 2021-07-10 ENCOUNTER — Other Ambulatory Visit: Payer: Self-pay

## 2021-07-10 NOTE — Patient Instructions (Signed)
Visit Information  Ms. Leslie Yates  - as a part of your Medicaid benefit, you are eligible for care management and care coordination services at no cost or copay. I was unable to reach you by phone today but would be happy to help you with your health related needs. Please feel free to call me @ [phone]      Gus Puma, BSW, [facility]   [facility]  High Risk Managed Medicaid Team  [phone]

## 2021-07-10 NOTE — Patient Outreach (Signed)
Care Coordination  07/10/2021  Leslie Yates [birth year] [id number]   Medicaid Managed Care   Unsuccessful Outreach Note  07/10/2021 Name: Leslie Yates MRN: [id number] DOB: 08/13/1990  Referred by: [name], NP Reason for referral : High Risk Managed Medicaid (MM Social Work [employer])   An unsuccessful telephone outreach was attempted today. The patient was referred to the case management team for assistance with care management and care coordination.   Follow Up Plan: The patient has been provided with contact information for the care management team and has been advised to call with any health related questions or concerns.   Gus Puma, BSW, [place] [facility]   [employer] Risk Managed Medicaid Team  [phone]

## 2021-07-17 ENCOUNTER — Ambulatory Visit: Payer: Medicaid Other | Admitting: Obstetrics and Gynecology

## 2021-07-19 ENCOUNTER — Telehealth: Payer: Self-pay | Admitting: Primary Care

## 2021-07-19 NOTE — Telephone Encounter (Signed)
.. °  Medicaid Managed Care   Unsuccessful Outreach Note  07/19/2021 Name: Leslie Yates MRN: [id number] DOB: 24-Feb-1991  Referred by: Kerin Perna, NP Reason for referral : High Risk Managed Medicaid (I called the patient today to get her visit with the MM RNCM rescheduled. I had to leave my name and number on her VM.)   An unsuccessful telephone outreach was attempted today. The patient was referred to the case management team for assistance with care management and care coordination.   Follow Up Plan: The care management team will reach out to the patient again over the next 7 days.    [place]

## 2021-07-21 ENCOUNTER — Telehealth: Payer: Self-pay

## 2021-07-21 NOTE — Telephone Encounter (Addendum)
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  I explained to the pt that it is contacted through sexual intercourse and unfortunately I can not tell her when she got it.  Pt reports that her partner tested negative.  I advised the pt that I can not explain however I do recommend that they both take treatment. Pt verbalized understanding with no further questions.   Leonette Nutting  07/24/21

## 2021-07-24 ENCOUNTER — Telehealth: Payer: Self-pay | Admitting: *Deleted

## 2021-07-24 NOTE — Patient Instructions (Signed)
[name] ,   The [facility] Managed Care Team is available to provide assistance to you with your healthcare needs at no cost and as a benefit of your [facility] Health plan. We have been unable to reach you on 3 separate attempts. The care management team is available to assist with your healthcare needs at any time. Please do not hesitate to contact me at the number below. .   Thank you,   Cranford Mon RN, CCM, CDCES [place]   [facility] Care Management Coordinator - Managed [place] High Risk [phone]

## 2021-07-24 NOTE — Patient Outreach (Signed)
Care Coordination  07/24/2021  Leslie Yates Jan 21, 1991 [id number]  07/24/2021 Name: Leslie Yates MRN: [id number] DOB: August 19, 1990  Referred by: Kerin Perna, NP Reason for referral : Case Closure (Closing to HR MM program- unable to maintain contact)  Received message from [place] on 07/19/21 that she has been unable to contact patient to schedule follow up appointments despite [place] and/or from [employer] team members.  Third unsuccessful telephone outreach was attempted today. The patient was referred to the case management team for assistance with care management and care coordination. The patient's primary care provider has been notified of our unsuccessful attempts to make or maintain contact with the patient. The care management team is pleased to engage with this patient at any time in the future should he/she be interested in assistance from the care management team.    Follow Up Plan: The Managed Medicaid care management team is available to follow up with the patient after provider conversation with the patient regarding recommendation for care management engagement and subsequent re-referral to the care management team.    Kelli Churn RN, CCM, [place] Management Coordinator - Managed [place] High Risk [phone]

## 2021-07-28 ENCOUNTER — Other Ambulatory Visit: Payer: Self-pay | Admitting: *Deleted
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
## 2021-08-07 ENCOUNTER — Ambulatory Visit: Payer: Medicaid Other | Admitting: Obstetrics and Gynecology

## 2021-08-07 ENCOUNTER — Encounter: Payer: Self-pay | Admitting: Obstetrics and Gynecology

## 2021-08-07 ENCOUNTER — Other Ambulatory Visit: Payer: Self-pay

## 2021-08-07 VITALS — BP 126/60 | HR 56 | Ht 73.0 in | Wt 253.8 lb

## 2021-08-07 DIAGNOSIS — Z30431 Encounter for routine checking of intrauterine contraceptive device: Secondary | ICD-10-CM

## 2021-08-07 DIAGNOSIS — Z202 Contact with and (suspected) exposure to infections with a predominantly sexual mode of transmission: Secondary | ICD-10-CM | POA: Diagnosis not present

## 2021-08-07 NOTE — Progress Notes (Signed)
Obstetrics and Gynecology Visit Return Patient Evaluation  Appointment Date: 08/07/2021  Primary Care Provider: Edwards, [place] for [facility]  Chief Complaint: IUD string check  History of Present Illness:  Leslie Yates is a 31 y.o. s/p 1/10 [place] IUD placement for birth control. Pt diagnosed with trich and bv at that visit. She states she took the week of flagyl w/o issue  Pt has had sex since placement and no issues and she is just finishing up a period and no problems or issues.   Review of Systems: as noted in the History of Present Illness.   Patient Active Problem List   Diagnosis Date Noted   Trichomonas vaginalis (TV) infection 07/06/2021   Encounter for insertion of progestin-releasing intrauterine contraceptive device (IUD) 06/01/2021   Encounter for postpartum visit 02/17/2021   H/O eclampsia 01/23/2021   IUP (intrauterine pregnancy), incidental 01/16/2021   Fetal cardiac anomaly affecting pregnancy, antepartum 10/20/2020   Hx of HELLP syndrome, currently pregnant, second trimester 09/16/2020   Hx of preterm delivery, currently pregnant, second trimester 09/16/2020   Supervision of high risk pregnancy, antepartum 05/23/2020   Seizure ([place]) 11/16/2019   Carrier of disease 07/01/2019   Sickle cell trait ([place])    Obesity in pregnancy, antepartum 06/12/2019   Tobacco use 10/28/2018   Asthma 10/27/2018   Complex cyst of right ovary 05/10/2017   Nausea and vomiting during pregnancy 11/09/2016   Medications:  Krishelle Sochor. Krichbaum had no medications administered during this visit. Current Outpatient Medications  Medication Sig Dispense Refill   acetaminophen (TYLENOL) 325 MG tablet Take 2 tablets (650 mg total) by mouth every 4 (four) hours as needed. 100 tablet 2   albuterol (VENTOLIN HFA) 108 (90 Base) MCG/ACT inhaler Inhale 2 puffs into the lungs every 6 (six) hours as needed for wheezing or shortness of breath. 18  g 1   ibuprofen (ADVIL) 600 MG tablet Take 1 tablet (600 mg total) by mouth every 6 (six) hours. 30 tablet 1   Blood Pressure Monitoring (BLOOD PRESSURE MONITOR AUTOMAT) DEVI 1 Device by Does not apply route daily. Automatic blood pressure cuff regular/x-large size cuff. To monitor blood pressure regularly at home. ICD-10 code: O09.90. (Patient not taking: Reported on 06/15/2021) 1 each 0   doxycycline (VIBRAMYCIN) 100 MG capsule Take 100 mg by mouth 2 (two) times daily. (Patient not taking: Reported on 08/07/2021)     ferrous sulfate (FERROUSUL) 325 (65 FE) MG tablet Take 1 tablet (325 mg total) by mouth every other day. (Patient not taking: Reported on 06/08/2021) 30 tablet 3   fluticasone (FLOVENT HFA) 110 MCG/ACT inhaler Inhale 2 puffs into the lungs in the morning and at bedtime. (Patient not taking: Reported on 07/04/2021) 12 g 12   NIFEdipine (PROCARDIA XL/NIFEDICAL XL) 60 MG 24 hr tablet Take 60 mg by mouth daily. (Patient not taking: Reported on 06/08/2021)     nortriptyline (PAMELOR) 25 MG capsule Take 2 capsules every night (Patient not taking: Reported on 02/17/2021) 60 capsule 11   Polyethylene Glycol 3350 (MIRALAX PO) Take by mouth.     SUMAtriptan (IMITREX) 100 MG tablet Take 1 tablet (100 mg total) by mouth once as needed for up to 1 dose for migraine. May repeat in 2 hours if headache persists or recurs. Do not take more than 3 a week (Patient not taking: Reported on 08/07/2021) 10 tablet 11   No current facility-administered medications for this visit.    Allergies: is allergic  to penicillins.  Physical Exam:  BP 126/60    Pulse (!) 56    Ht 6\' 1"  (1.854 m)    Wt 253 lb 12.8 oz (115.1 kg)    LMP 07/31/2021 (Approximate)    BMI 33.48 kg/m  Body mass index is 33.48 kg/m. General appearance: Well nourished, well developed female in no acute distress.  Abdomen: diffusely non tender to palpation, non distended, and no masses, hernias Neuro/Psych:  Normal mood and affect.    Pelvic  exam:  EGBUS: normal Vaginal vault: normal with scant old blood in vault, no active bleeding Cervix:  IUD strings 3-4cm from cervical os Bimanual: negative   Assessment: pt doing well  Plan:  1. Trichomonas exposure Too early for test of cure. Will bring pt back in a few weeks for self swab visit  2. STD exposure  3. IUD check up Normal check up today  Durene Romans MD Attending Center for [facility])

## 2021-08-08 ENCOUNTER — Telehealth: Payer: Self-pay | Admitting: *Deleted

## 2021-08-08 NOTE — Telephone Encounter (Signed)
° °  Telephone encounter was:  Successful.  08/08/2021 Name: Leslie Yates MRN: [id number] DOB: 12-Jun-1991  Leslie Yates is a 31 y.o. year old female who is a primary care patient of [name], NP . The community resource team was consulted for assistance with Food Insecurity and homeless ness  Care guide performed the following interventions: Patient provided with information about care guide support team and interviewed to confirm resource needs Follow up call placed to community resources to determine status of patients referral.talked with patient she and her children are on the verge of homelessness waiting to be padlocked out of housing . Provided information on housing and emergency shelters via email and also placed an [place] cares 360 referral for housing assistance   Follow Up Plan:  In one week will reach out   Alois Cliche -[facility] Guide , Embedded Care Coordination [facility], Care Management  [phone] [address] [place] , [place] [address] Email : [email or web address]

## 2021-08-14 ENCOUNTER — Ambulatory Visit: Payer: Medicaid Other

## 2021-08-28 ENCOUNTER — Ambulatory Visit: Payer: Medicaid Other

## 2021-08-31 DIAGNOSIS — R112 Nausea with vomiting, unspecified: Secondary | ICD-10-CM | POA: Diagnosis not present

## 2021-08-31 DIAGNOSIS — R197 Diarrhea, unspecified: Secondary | ICD-10-CM | POA: Diagnosis not present

## 2021-09-06 ENCOUNTER — Telehealth: Payer: Self-pay | Admitting: General Practice

## 2021-09-06 ENCOUNTER — Other Ambulatory Visit (HOSPITAL_COMMUNITY)
Admission: RE | Admit: 2021-09-06 | Discharge: 2021-09-06 | Disposition: A | Discharge status: Home / Self Care | Payer: Medicaid Other | Source: Ambulatory Visit | Attending: Family Medicine | Admitting: Family Medicine

## 2021-09-06 ENCOUNTER — Other Ambulatory Visit: Payer: Self-pay

## 2021-09-06 ENCOUNTER — Ambulatory Visit (INDEPENDENT_AMBULATORY_CARE_PROVIDER_SITE_OTHER): Payer: Medicaid Other | Admitting: General Practice

## 2021-09-06 VITALS — BP 126/89 | HR 61 | Ht 73.0 in | Wt 254.0 lb

## 2021-09-06 DIAGNOSIS — Z8619 Personal history of other infectious and parasitic diseases: Secondary | ICD-10-CM | POA: Insufficient documentation

## 2021-09-06 NOTE — Progress Notes (Signed)
Patient presents to office today for test of cure following recent trichomonas infection. Patient states she has completed the prescribed course of medication & has not been sexually active since. Patient was instructed in self swab & specimen was collected. Discussed results will be back in 24-48 hours and available via mychart. Patient had elevated phq9 & gad7 today but reports seeing a counselor. She will return as needed for GYN care.  ? ?Chase Caller RN BSN ?09/06/21 ? ?

## 2021-09-06 NOTE — Telephone Encounter (Signed)
Opened in error

## 2021-09-07 LAB — CERVICOVAGINAL ANCILLARY ONLY
Bacterial Vaginitis (gardnerella): POSITIVE — AB
Candida Glabrata: NEGATIVE
Candida Vaginitis: NEGATIVE
Chlamydia: NEGATIVE
Comment: NEGATIVE
Comment: NEGATIVE
Comment: NEGATIVE
Comment: NEGATIVE
Comment: NEGATIVE
Comment: NORMAL
Neisseria Gonorrhea: NEGATIVE
Trichomonas: NEGATIVE

## 2021-09-08 ENCOUNTER — Other Ambulatory Visit: Payer: Self-pay | Admitting: Obstetrics and Gynecology

## 2021-09-08 ENCOUNTER — Encounter: Payer: Self-pay | Admitting: Obstetrics and Gynecology

## 2021-09-08 DIAGNOSIS — A5901 Trichomonal vulvovaginitis: Secondary | ICD-10-CM

## 2021-09-08 MED ORDER — METRONIDAZOLE 500 MG PO TABS: 500.0000 mg | ORAL_TABLET | Freq: Two times a day (BID) | ORAL | 0 refills | Status: AC

## 2021-09-11 ENCOUNTER — Ambulatory Visit (HOSPITAL_COMMUNITY): Payer: Medicaid Other | Admitting: Clinical

## 2021-09-12 ENCOUNTER — Emergency Department (HOSPITAL_COMMUNITY): Payer: Medicaid Other

## 2021-09-12 ENCOUNTER — Other Ambulatory Visit: Payer: Self-pay

## 2021-09-12 ENCOUNTER — Emergency Department (HOSPITAL_COMMUNITY)
Admission: EM | Admit: 2021-09-12 | Discharge: 2021-09-12 | Disposition: A | Discharge status: Home / Self Care | Payer: Medicaid Other | Attending: Emergency Medicine | Admitting: Emergency Medicine

## 2021-09-12 DIAGNOSIS — M545 Low back pain, unspecified: Secondary | ICD-10-CM | POA: Diagnosis not present

## 2021-09-12 DIAGNOSIS — S3992XA Unspecified injury of lower back, initial encounter: Secondary | ICD-10-CM | POA: Insufficient documentation

## 2021-09-12 DIAGNOSIS — M5459 Other low back pain: Secondary | ICD-10-CM | POA: Diagnosis not present

## 2021-09-12 DIAGNOSIS — S99911A Unspecified injury of right ankle, initial encounter: Secondary | ICD-10-CM | POA: Diagnosis not present

## 2021-09-12 MED ORDER — NAPROXEN 500 MG PO TABS: 500.0000 mg | ORAL_TABLET | Freq: Two times a day (BID) | ORAL | 0 refills | Status: DC
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
## 2021-09-12 NOTE — Discharge Instructions (Addendum)
I have sent naproxen to your pharmacy.  This is a medication you will take in the morning and at nighttime.  Follow-up with your primary care provider if your symptoms have not resolved in the next week. ?
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
## 2021-09-13 ENCOUNTER — Telehealth: Payer: Self-pay

## 2021-09-13 NOTE — Telephone Encounter (Signed)
Transition Care Management Follow-up Telephone Call ?Date of discharge and from where: 09/12/2021 from [facility] ?How have you been since you were released from the hospital? Patient stated that she is feeling better and did not have any questions or concerns at this time.  ?Any questions or concerns? No ? ?Items Reviewed: ?Did the pt receive and understand the discharge instructions provided? Yes  ?Medications obtained and verified? Yes  ?Other? No  ?Any new allergies since your discharge? No  ?Dietary orders reviewed? No ?Do you have support at home? Yes  ? ?Functional Questionnaire: (I = Independent and D = Dependent) ?ADLs: I ? ?Bathing/Dressing- I ? ?Meal Prep- I ? ?Eating- I ? ?Maintaining continence- I ? ?Transferring/Ambulation- I ? ?Managing Meds- I ? ? ?Follow up appointments reviewed: ? ?PCP Hospital f/u appt confirmed? No   ?Specialist Hospital f/u appt confirmed? No   ?Are transportation arrangements needed? No  ?If their condition worsens, is the pt aware to call PCP or go to the Emergency Dept.? Yes ?Was the patient provided with contact information for the PCP's office or ED? Yes ?Was to pt encouraged to call back with questions or concerns? Yes ? ?

## 2021-10-19 ENCOUNTER — Ambulatory Visit: Payer: Medicaid Other

## 2021-10-31 DIAGNOSIS — J039 Acute tonsillitis, unspecified: Secondary | ICD-10-CM | POA: Diagnosis not present

## 2021-10-31 DIAGNOSIS — R059 Cough, unspecified: Secondary | ICD-10-CM | POA: Diagnosis not present

## 2021-10-31 DIAGNOSIS — Z20822 Contact with and (suspected) exposure to covid-19: Secondary | ICD-10-CM | POA: Diagnosis not present

## 2021-11-01 ENCOUNTER — Telehealth: Payer: Self-pay | Admitting: *Deleted

## 2021-11-01 NOTE — Chronic Care Management (AMB) (Signed)
? ?  Note ?11/01/2021 ?Name: Leslie Yates MRN: [id number] DOB: 1991/01/04 ? ?Referred by: [name], NP ?Reason for referral : High Risk Managed Medicaid (Outreach to schedule follow up with [facility] ) ? ? ?An unsuccessful telephone outreach was attempted today. The patient was referred to the case management team for assistance with care management and care coordination.   ? ?Follow Up Plan: A HIPAA compliant phone message was left for the patient providing contact information and requesting a return call. and The Managed Medicaid care management team will reach out to the patient again over the next 1 days.  ? ? ?Gwenevere Ghazi  ?Care Guide, Embedded Care Coordination  High Risk Medicaid Managed Care  ?[facility]  Care Management [facility]  ?Direct Dial: [phone] ?

## 2021-11-03 ENCOUNTER — Other Ambulatory Visit: Payer: Self-pay | Admitting: *Deleted

## 2021-11-03 ENCOUNTER — Ambulatory Visit: Payer: Medicaid Other

## 2021-11-03 NOTE — Patient Outreach (Addendum)
Care Coordination ? ?11/03/2021 ? ?Leslie Yates ?1991/02/19 ?[id number] ? ?Spoke with patient via telephone with purpose of completing follow up assessment for  Medicaid Managed - High Risk Care Management /Care Coordination Program however patient states she was in [place] ED until very early in the morning with her youngest child Leslie Yates ( she will be one year old in July) who has a bad cold and cough and was having difficulty breathing. Says the baby was nasally suctioned and given a breathing treatment and steroids and sent home but she continues to cry and acts as though she does not feel well. This RNCM encouraged Leslie Yates to call child's pediatrician today for an acute visit. She also says her other daughter Leslie Yates who will be 39 years old on 5/26 and herself have been sick with colds for several weeks and have been told it is viral in nature. Chart review demonstrates patient was seen at [facility] on 10/31/21 and diagnosed with tonsillitis and was treated with Z pack.  ?Patient says she and her 2 children moved into a 2 bedroom apartment on 09/29/21 [facility]) however they were without a stove and refrigerator until just last week.  ? ?Plan: ?After discussion, will call patient on 11/06/21 at 10:00 am to complete high risk managed Medicaid care coordination  follow up assessment. ? ?Cranford Mon RN, CCM, CDCES ?[place]  [facility] ?Care Management Coordinator - Managed Medicaid High Risk ?[phone]   ?

## 2021-11-06 ENCOUNTER — Telehealth: Payer: Self-pay | Admitting: *Deleted

## 2021-11-06 ENCOUNTER — Ambulatory Visit: Payer: Self-pay

## 2021-11-06 NOTE — Patient Instructions (Signed)
Nashya J Arango ,   The Medicaid Managed Care Team is available to provide assistance to you with your healthcare needs at no cost and as a benefit of your Medicaid Health plan. I'm sorry I was unable to reach you today for our scheduled appointment. Our care guide will call you to reschedule our telephone appointment. Please call me at the number below. I am available to be of assistance to you regarding your healthcare needs. .   Thank you,   Aisley Whan RN, CCM, CDCES [facility]  [facility] Care Management Coordinator - Managed Medicaid High Risk [phone]   

## 2021-11-06 NOTE — Patient Outreach (Signed)
Care Coordination ? ?11/06/2021 ? ?[name] ?10-Aug-1990 ?[id number] ? ?11/06/2021 ?Name: Leslie Yates MRN: [id number] DOB: 20-Mar-1991 ? ?Referred by: Kerin Perna, NP ?Reason for referral : High Risk Managed Medicaid (Unsuccessful RNCM follow up telephone outreach) ? ? ?An unsuccessful telephone outreach was attempted today. The patient was referred to the case management team for assistance with care management and care coordination.   ? ?Follow Up Plan: The Managed Medicaid care management team will reach out to the patient again over the next 7 days.  ? ? ?Kelli Churn RN, CCM, CDCES ?[facility] ?Care Management Coordinator - Managed Medicaid High Risk ?[phone]  ?

## 2021-11-07 ENCOUNTER — Other Ambulatory Visit: Payer: Self-pay | Admitting: *Deleted

## 2021-11-07 NOTE — Patient Outreach (Addendum)
?Medicaid Managed Care   ?Nurse Care Manager Note ? ?11/07/2021 ?Name:  Leslie Yates MRN:  [id number] DOB:  June 18, 1991 ? ?Leslie Yates is an 31 y.o. year old female who is a primary patient of Griffin Basil, MD.  [facility] Managed Care Coordination team was consulted for assistance with:    ?Asthma ?Tobacco use ? ? ?Ms. Krouse was given information about [employer] team services today. [name] Patient agreed to services and verbal consent obtained. ? ?Engaged with patient by telephone for follow up visit in response to provider referral for case management and/or care coordination services.  ? ?Assessments/Interventions:  Review of past medical history, allergies, medications, health status, including review of consultants reports, laboratory and other test data, was performed as part of comprehensive evaluation and provision of chronic care management services. ? ?SDOH (Social Determinants of Health) assessments and interventions performed: ?SDOH Interventions   ? ?Flowsheet Row Most Recent Value  ?SDOH Interventions   ?Housing Interventions Intervention Not Indicated  [patient has secured 2 bedroom apartment at [place]  ?Transportation Interventions Intervention Not Indicated  ? ?  ? ? ?Care Plan ? ?Allergies  ?Allergen Reactions  ? Penicillins Rash  ?  Childhood rash, Tolerated cefazolin (2021) ?Has patient had a PCN reaction causing immediate rash, facial/tongue/throat swelling, SOB or lightheadedness with hypotension: no ?Has patient had a PCN reaction causing severe rash involving mucus membranes or skin necrosis:no ?Has patient had a PCN reaction that required hospitalization no ?Has patient had a PCN reaction occurring within the last 10 years: no ?If all of the above answers are "NO", then may proceed with Cephalosporin use.  ? ? ?Medications Reviewed Today   ? ? Reviewed by Barrington Ellison, RN (Registered Nurse) on 11/07/21 at 1303  Med List Status: <None>   ? ?Medication Order Taking? Sig Documenting Provider Last Dose Status Informant  ?acetaminophen (TYLENOL) 325 MG tablet [id number] No Take 2 tablets (650 mg total) by mouth every 4 (four) hours as needed.  ?Patient not taking: Reported on 11/07/2021  ? Starr Lake, CNM Not Taking Active   ?albuterol (VENTOLIN HFA) 108 (90 Base) MCG/ACT inhaler [id number] No Inhale 2 puffs into the lungs every 6 (six) hours as needed for wheezing or shortness of breath.  ?Patient not taking: Reported on 11/07/2021  ? Kerin Perna, NP Not Taking Active   ?Blood Pressure Monitoring (BLOOD PRESSURE MONITOR AUTOMAT) DEVI [id number] No 1 Device by Does not apply route daily. Automatic blood pressure cuff regular/x-large size cuff. To monitor blood pressure regularly at home. ICD-10 code: O09.90.  ?Patient not taking: Reported on 06/15/2021  ? Woodroe Mode, MD Not Taking Active   ?doxycycline (VIBRAMYCIN) 100 MG capsule [id number] No Take 100 mg by mouth 2 (two) times daily.  ?Patient not taking: Reported on 08/07/2021  ? [provider] Not Taking Active   ?ferrous sulfate (FERROUSUL) 325 (65 FE) MG tablet [id number] No Take 1 tablet (325 mg total) by mouth every other day.  ?Patient not taking: Reported on 06/08/2021  ? Truett Mainland, DO Not Taking Active   ?fluticasone (FLOVENT HFA) 110 MCG/ACT inhaler [id number] No Inhale 2 puffs into the lungs in the morning and at bedtime.  ?Patient not taking: Reported on 07/04/2021  ? Kerin Perna, NP Not Taking Active   ?         ?Med Note Katrine Coho Feb 20, 2021  5:29 PM) Counseled on  using twice daily not prn as she is currently doing  ?ibuprofen (ADVIL) 600 MG tablet [id number] Yes Take 1 tablet (600 mg total) by mouth every 6 (six) hours. Griffin Basil, MD Taking Active   ?naproxen (NAPROSYN) 500 MG tablet [id number] No Take 1 tablet (500 mg total) by mouth 2 (two) times daily.  ?Patient not taking: Reported on 11/07/2021  ? [place]wine, Cecilio Asper,  PA-C Not Taking Active   ?NIFEdipine (PROCARDIA XL/NIFEDICAL XL) 60 MG 24 hr tablet [id number] No Take 60 mg by mouth daily.  ?Patient not taking: Reported on 06/08/2021  ? [provider] Not Taking Active   ?nortriptyline (PAMELOR) 25 MG capsule [id number] No Take 2 capsules every night  ?Patient not taking: Reported on 02/17/2021  ? Cameron Sprang, MD Not Taking Active   ?         ?Med Note Katrine Coho Feb 20, 2021  5:20 PM) States she never pick up from pharmacy  ?Polyethylene Glycol 3350 (MIRALAX PO) [id number] No Take by mouth.  ?Patient not taking: Reported on 11/07/2021  ? [provider] Not Taking Active   ?SUMAtriptan (IMITREX) 100 MG tablet [id number] No Take 1 tablet (100 mg total) by mouth once as needed for up to 1 dose for migraine. May repeat in 2 hours if headache persists or recurs. Do not take more than 3 a week  ?Patient not taking: Reported on 08/07/2021  ? Cameron Sprang, MD Not Taking Active   ? ?  ?  ? ?  ? ? ?Patient Active Problem List  ? Diagnosis Date Noted  ? Encounter for postpartum visit 02/17/2021  ? Seizure ([place]) 11/16/2019  ? Carrier of disease 07/01/2019  ? Sickle cell trait ([place])   ? Obesity in pregnancy, antepartum 06/12/2019  ? Tobacco use 10/28/2018  ? Asthma 10/27/2018  ? Complex cyst of right ovary 05/10/2017  ? Nausea and vomiting during pregnancy 11/09/2016  ? ? ?Care Plan : RN Care Manager Plan Of Care  ?Updates made by Barrington Ellison, RN since 11/07/2021 12:00 AM  ?  ? 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  ? ?Long-Range Goal: Development of Plan Of Care For Chronic Disease Management and Care Coordination Needs To Assist With Meeting Treatment Goals for HTN, Asthma, tobacco use, and obesity   ?Start Date: 02/20/2021  ?Expected End Date: 02/20/2022  ?Priority: High  ?Note:   ?Current Barriers:  ?Care Coordination needs related to Financial constraints related to limited income,  Limited social support, Transportation, Housing barriers, Social Isolation, Lack of essential utilities - difficulty paying bills, and Lacks knowledge of community resource: related to health insurance benefits -  ?Chronic Disease Management support and education needs related to Asthma and tobacco use ?Lacks caregiver support. 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 - reports she needs a root canal and crown but she cancelled the dental appointment because it will cost $2300- received dental resources from community care guide ?Transportation barriers- currently her truck is functional but she has difficulty making payments ?Knowledge deficit related to managed Medicaid Healthy Blue plan benefits- states she used the one time  utility bill assistance and the smoking cessation counselor but needs medication (patches)  to help her quit, she is also aware of Weight Watchers program and phone program and free vegetables and fruit home delivery benefit ?Housing Insecurity- patient states she moved into a 2 bedroom apartment on 09/29/21, [place] ? ?RNCM Clinical Goal(s):  ?Patient will verbalize understanding of plan for management of asthma and tobacco use ?take all medications exactly as prescribed and will call provider for medication related questions ?demonstrate understanding of rationale for each prescribed medication especially asthma maintenance medication ?attend all scheduled medical appointments: with ob/gyn ?demonstrate improved adherence to prescribed treatment plan for asthma and tobacco use as evidenced by adherence to prescribed medication regimen contacting provider for new or worsened symptoms or  questions and attending virtual counseling sessions ?continue to work with RN Care Manager to address care management and care coordination needs related to post partum depression and asthma and smoking cessation ?work

## 2021-11-07 NOTE — Patient Instructions (Addendum)
Visit Information ? ?Leslie Yates was given information about Medicaid Managed Care team care coordination services as a part of their Healthy Blue Medicaid benefit. Leslie CockingAkeiba J Yates verbally [facility].  ? ?If you are experiencing a medical emergency, please call 911 or report to your local emergency department or urgent care.  ? ?If you have a non-emergency medical problem during routine business hours, please contact your provider'Yates office and ask to speak with a nurse.  ? ?For questions related to your [facility] Medicaid health plan, please call: [phone] or visit the homepage here: [email or web address] ? ?If you would like to schedule transportation through your [facility] Medicaid plan, please call the following number at least 2 days in advance of your appointment: [phone] ? For information about your ride after you set it up, call Ride Assist at [phone]. Use this number to activate a Will Call pickup, or if your transportation is late for a scheduled pickup. Use this number, too, if you need to make a change or cancel a previously scheduled reservation. ? If you need transportation services right away, call [phone]. The after-hours call center is staffed 24 hours to handle ride assistance and urgent reservation requests (including discharges) 365 days a year. Urgent trips include sick visits, hospital discharge requests and life-sustaining treatment. ? ?Call the [facility] Crisis Line at [phone], at any time, 24 hours a day, 7 days a week. If you are in danger or need immediate medical attention call 911. ? ?If you would like help to quit smoking, call 1-800-QUIT-NOW (978-537-31641-318-493-5724) OR Espa?ol: 1-855-D?jelo-Ya 215 609 2281(1-973-562-8132) o para m?Yates informaci?n haga clic aqu? or Text READY to 200-400 to register via text ? ?[name] - following are the goals we discussed in your visit today:  ?Patient will  self administer medications as prescribed ?Patient will attend all scheduled provider appointments ?Patient will call pharmacy for medication refills ?Patient will ask provider for Rx for nicotine patches to assist with smoking cessation ?Patient will continue to perform ADL'Yates independently ?Patient will continue to perform IADL'Yates independently ?Patient will get another BP monitor, monitor her blood pressure daily and when she doesn't feel well and record, and call her PCP for findings outside established parameters  ?Patient will call provider office for new concerns or questions ?Patient will work with BSW to address care coordination needs and will continue to work with the clinical team to address health care and disease management related needs.   ?  ? ? ?Patient verbalizes understanding of instructions and care plan provided today and agrees to view in MyChart. Active MyChart status confirmed with patient.   ? ?The Managed Medicaid care management team will reach out to the patient again over the next 30 days.  ? ?Leslie MonJanet Dariya Gainer RN, CCM, CDCES ?Leslie Yates  [facility] ?Care Management Coordinator - Managed Medicaid High Risk ?[phone]  ? ?Following is a copy of your plan of care:  ?Care Plan : RN Care Manager Plan Of Care  ?Updates made by Leslie Yates, Leslie Kellett S, RN since 11/07/2021 12:00 AM  ?  ? 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  ? ?Long-Range Goal: Development of Plan Of Care For Chronic Disease Management and Care Coordination Needs To Assist With Meeting Treatment Goals for HTN, Asthma, tobacco use, and obesity   ?Start Date: 02/20/2021  ?Expected End Date: 02/20/2022  ?Priority: High  ?Note:   ?Current Barriers:  ?  Care Coordination needs related to Financial constraints related to limited income, Limited social support, Transportation, Housing barriers, Social Isolation, Lack of essential utilities - difficulty paying bills,  and Lacks knowledge of community resource: related to health insurance benefits -  ?Chronic Disease Management support and education needs related to Asthma and tobacco use ?Lacks caregiver support. 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 - reports she needs a root canal and crown but she cancelled the dental appointment because it will cost $2300- received dental resources from community care guide ?Transportation barriers- currently her truck is functional but she has difficulty making payments ?Knowledge deficit related to managed Medicaid Healthy Blue plan benefits- states she used the one time utility bill assistance and the smoking cessation counselor but needs medication (patches)  to help her quit, she is also aware of Weight Watchers program and phone program and free vegetables and fruit home delivery benefit ?Housing Insecurity- patient states she moved into a 2 bedroom apartment on 09/29/21, [address] ? ?RNCM Clinical Goal(Yates):  ?Patient will verbalize understanding of plan for management of asthma and tobacco use ?take all medications exactly as prescribed and will call provider for medication related questions ?demonstrate understanding of rationale for each prescribed medication especially asthma maintenance medication ?attend all scheduled medical appointments: with ob/gyn ?demonstrate improved adherence to prescribed treatment plan for asthma and tobacco use as evidenced by adherence to prescribed medication regimen contacting provider for new or worsened symptoms or questions and attending virtual counseling sessions ?continue to work with RN Care Manager to address care management and care  coordination needs related to post partum depression and asthma and smoking cessation ?work with pharmacist to address Financial constraints related to limited income, Limited social support, Transportation, Housing barriers, Geneticist, molecular, and Lack of essential utilities - difficulty paying household bills, related to asthma and tobacco use ?work with Child psychotherapist to Sports coach constraints related to limited income, Limited social support, Transportation, Housing barriers, Social Isolation, and Lack of essential utilities - and difficulty paying household bills related to the management of asthma and smoking cessation through collaboration with Medical illustrator, provider, and care team.  ? ?Interventions: ?Advised patient role of Managed Medicaid RN will transition to Estanislado Emms RN and follow up telephone appointment scheduled for 12/11/21 at 10:30 am ? ? ?Interdisciplinary Collaboration:  (Status: Goal on Track (progressing): YES.- )patient is working with [employer] and [employer]; states she just moved to 2 bedroom apartment in [place] on 09/29/21 ?Collaborated with BSW to initiate plan of care to address needs related to Financial constraints related to limited income, Limited social support, Transportation, Housing barriers, Geneticist, molecular, Limited access to caregiver, and Lacks knowledge of community resource: related to health plan benefits  in patient with and tobacco use  ?Collaboration with [name], NP regarding development and update of comprehensive plan of care as evidenced by provider attestation and co-signature ?Inter-disciplinary care team collaboration (see longitudinal plan of care) ? ?Asthma: (Status: Goal on track: YES.)- patient reports no recent  asthma exacerbations, does not use Flovent as directed despite repeated education ?Assessed asthma exacerbation occurrences ?Reminded patient to self assesses asthma action plan zone and make  appointment  with provider if in the yellow zone for 48 hours without improvement; ? ?Smoking Cessation: (Status: Goal on track: NO.) - patient reports she is still smoking, is engaged with her health plan'Yates [employer]

## 2021-11-08 ENCOUNTER — Ambulatory Visit: Payer: Medicaid Other

## 2021-11-22 ENCOUNTER — Ambulatory Visit (INDEPENDENT_AMBULATORY_CARE_PROVIDER_SITE_OTHER): Payer: Medicaid Other

## 2021-11-22 ENCOUNTER — Other Ambulatory Visit (HOSPITAL_COMMUNITY)
Admission: RE | Admit: 2021-11-22 | Discharge: 2021-11-22 | Disposition: A | Discharge status: Home / Self Care | Payer: Medicaid Other | Source: Ambulatory Visit | Attending: Family Medicine | Admitting: Family Medicine

## 2021-11-22 VITALS — BP 129/76 | HR 89 | Wt 258.7 lb

## 2021-11-22 DIAGNOSIS — N898 Other specified noninflammatory disorders of vagina: Secondary | ICD-10-CM | POA: Insufficient documentation

## 2021-11-22 DIAGNOSIS — F419 Anxiety disorder, unspecified: Secondary | ICD-10-CM

## 2021-11-22 NOTE — Progress Notes (Unsigned)
Pt here today requesting a self swab for vaginal discharge.  Pt reports that she is having a foul odor as well.  Pt states that her

## 2021-11-24 ENCOUNTER — Other Ambulatory Visit: Payer: Self-pay | Admitting: Certified Nurse Midwife

## 2021-11-24 DIAGNOSIS — N760 Acute vaginitis: Secondary | ICD-10-CM

## 2021-11-24 LAB — CERVICOVAGINAL ANCILLARY ONLY
Bacterial Vaginitis (gardnerella): POSITIVE — AB
Candida Glabrata: NEGATIVE
Candida Vaginitis: NEGATIVE
Chlamydia: NEGATIVE
Comment: NEGATIVE
Comment: NEGATIVE
Comment: NEGATIVE
Comment: NEGATIVE
Comment: NEGATIVE
Comment: NORMAL
Neisseria Gonorrhea: NEGATIVE
Trichomonas: NEGATIVE

## 2021-11-24 MED ORDER — METRONIDAZOLE 0.75 % VA GEL: 1.0000 | Freq: Every day | VAGINAL | 1 refills | Status: DC

## 2021-11-30 NOTE — BH Specialist Note (Signed)
Pt did not arrive to video visit and did not answer the phone; Left HIPPA-compliant message to call back Samik Balkcom from [facility] at  [phone] (Keiasia Christianson's office).  ?; left MyChart message for patient.  ? ?

## 2021-12-11 ENCOUNTER — Other Ambulatory Visit: Payer: Self-pay | Admitting: *Deleted

## 2021-12-11 NOTE — Patient Instructions (Signed)
Visit Information  Ms. Krislynn J Quijas  - as a part of your Medicaid benefit, you are eligible for care management and care coordination services at no cost or copay. I was unable to reach you by phone today but would be happy to help you with your health related needs. Please feel free to call me @ [phone].   A member of the Managed Medicaid care management team will reach out to you again over the next 14 days.   Jahmal Dunavant RN, BSN [place]  [facility] RN Care Coordinator   

## 2021-12-11 NOTE — Patient Outreach (Signed)
Care Coordination  12/11/2021  EVOLETT SOMARRIBA 01/20/1991 [id number]   Medicaid Managed Care   Unsuccessful Outreach Note  12/11/2021 Name: ROSHANDA BALAZS MRN: [id number] DOB: September 19, 1990  Referred by: [name], MD Reason for referral : High Risk Managed Medicaid (Unsuccessful RNCM follow up telephone outreach)   An unsuccessful telephone outreach was attempted today. The patient was referred to the case management team for assistance with care management and care coordination.   Follow Up Plan: A HIPAA compliant phone message was left for the patient providing contact information and requesting a return call.   Estanislado Emms RN, BSN [place]  [facility]

## 2021-12-13 ENCOUNTER — Other Ambulatory Visit (HOSPITAL_COMMUNITY): Payer: Self-pay

## 2021-12-13 ENCOUNTER — Other Ambulatory Visit: Payer: Self-pay | Admitting: *Deleted

## 2021-12-13 NOTE — Patient Instructions (Signed)
Visit Information  [name]  - as a part of your Medicaid benefit, you are eligible for care management and care coordination services at no cost or copay. I was unable to reach you by phone today but would be happy to help you with your health related needs. Please feel free to call me @ [phone].   A member of the Managed Medicaid care management team will reach out to you again over the next 14 days.   Estanislado Emms RN, BSN [place]  [facility]

## 2021-12-13 NOTE — Patient Outreach (Signed)
Care Coordination  12/13/2021  Leslie Yates [birth year] [id number]   Medicaid Managed Care   Unsuccessful Outreach Note  12/13/2021 Name: Leslie Yates MRN: [id number] DOB: [birth year]  Referred by: [name], MD Reason for referral : High Risk Managed Medicaid (Unsuccessful RNCM follow up telephone outreach, 2nd attempt)   A second unsuccessful telephone outreach was attempted today. The patient was referred to the case management team for assistance with care management and care coordination.   Follow Up Plan: A HIPAA compliant phone message was left for the patient providing contact information and requesting a return call.   Estanislado Emms RN, BSN [place]  [facility]

## 2021-12-14 ENCOUNTER — Ambulatory Visit: Payer: Medicaid Other | Admitting: Clinical

## 2021-12-14 DIAGNOSIS — Z91199 Patient's noncompliance with other medical treatment and regimen due to unspecified reason: Secondary | ICD-10-CM

## 2021-12-22 ENCOUNTER — Telehealth: Payer: Self-pay | Admitting: Obstetrics and Gynecology

## 2021-12-22 NOTE — Telephone Encounter (Signed)
..   Medicaid Managed Care   Unsuccessful Outreach Note  12/22/2021 Name: Leslie Yates MRN: [id number] DOB: 1991/03/19  Referred by: [name], MD Reason for referral : High Risk Managed Medicaid (I called the patient today to get her rescheduled with the MM RNCM. I left my name and number on her VM.)   Third unsuccessful telephone outreach was attempted today. The patient was referred to the case management team for assistance with care management and care coordination. The patient's primary care provider has been notified of our unsuccessful attempts to make or maintain contact with the patient. The care management team is pleased to engage with this patient at any time in the future should he/she be interested in assistance from the care management team.   Follow Up Plan: We have been unable to make contact with the patient for follow up. The care management team is available to follow up with the patient after provider conversation with the patient regarding recommendation for care management engagement and subsequent re-referral to the care management team.    Weston Settle Care Guide, High Risk Medicaid Managed Care Embedded Care Coordination [facility]  [facility]    SIGNATURE

## 2021-12-26 ENCOUNTER — Emergency Department (HOSPITAL_COMMUNITY)
Admission: EM | Admit: 2021-12-26 | Discharge: 2021-12-26 | Disposition: A | Discharge status: Home / Self Care | Payer: Medicaid Other | Attending: Emergency Medicine | Admitting: Emergency Medicine

## 2021-12-26 ENCOUNTER — Emergency Department (HOSPITAL_COMMUNITY): Payer: Medicaid Other

## 2021-12-26 ENCOUNTER — Encounter (HOSPITAL_COMMUNITY): Payer: Self-pay

## 2021-12-26 ENCOUNTER — Other Ambulatory Visit: Payer: Self-pay

## 2021-12-26 DIAGNOSIS — W228XXA Striking against or struck by other objects, initial encounter: Secondary | ICD-10-CM | POA: Insufficient documentation

## 2021-12-26 DIAGNOSIS — J45909 Unspecified asthma, uncomplicated: Secondary | ICD-10-CM | POA: Insufficient documentation

## 2021-12-26 DIAGNOSIS — S60221A Contusion of right hand, initial encounter: Secondary | ICD-10-CM | POA: Diagnosis not present

## 2021-12-26 DIAGNOSIS — Y9389 Activity, other specified: Secondary | ICD-10-CM | POA: Insufficient documentation

## 2021-12-26 DIAGNOSIS — S6991XA Unspecified injury of right wrist, hand and finger(s), initial encounter: Secondary | ICD-10-CM | POA: Diagnosis not present

## 2021-12-26 MED ORDER — OXYCODONE-ACETAMINOPHEN 5-325 MG PO TABS
1.0000 | ORAL_TABLET | Freq: Once | ORAL | Status: AC
  Administered 2021-12-26: 1 via ORAL
  Filled 2021-12-26: qty 1

## 2021-12-26 MED ORDER — METHOCARBAMOL 500 MG PO TABS: 500.0000 mg | ORAL_TABLET | Freq: Three times a day (TID) | ORAL | 0 refills | Status: AC | PRN

## 2021-12-26 NOTE — ED Provider Triage Note (Signed)
Emergency Medicine Provider Triage Evaluation Note  Leslie Yates , a 31 y.o. female  was evaluated in triage.  Pt complains of right hand injury occurring about 30 minutes prior to ER arrival.  Patient was working on a vehicle and was tried to get a dent out of it door, reports hitting it with the ulnar side of her hand.  With doing so she had significant pain along that part of her hand that is now causing some numbness.  No other trauma.  Review of Systems  As above  Physical Exam  There were no vitals taken for this visit. Gen:   Awake, no distress   Resp:  Normal effort  MSK:   Moves extremities without difficulty  Other:  Tenderness over 5th metacarpal with soft tissue swelling, 2+ radial pulse, normal capillary refill, normal wrist ROM, can wiggle first-third fingers, hesistant to move 4th and 5th  Medical Decision Making  Medically screening exam initiated at 1:14 PM.  Appropriate orders placed.  KENIAH KLEMMER was informed that the remainder of the evaluation will be completed by another provider, this initial triage assessment does not replace that evaluation, and the importance of remaining in the ED until their evaluation is complete.  Workup initiated including hand x-ray   Ayris Carano T, PA-C 12/26/21 1316

## 2021-12-26 NOTE — ED Triage Notes (Signed)
Pt states she was hitting an iron door with the outer part of her right hand to fix the door and injured her right hand. Pt has 1+ swelling of right hand. Pt has 2+ right radial pulse, cap refill less than 3 sec, warm to touch. Pt is tearful in triage and shaking.

## 2021-12-26 NOTE — ED Provider Notes (Signed)
[facility] EMERGENCY DEPARTMENT Provider Note   CSN: [id number] Arrival date & time: 12/26/21  1308     History  Chief Complaint  Patient presents with   Hand Pain    Leslie Yates is a 31 y.o. female.   Hand Pain  Patient presents with right hand injury.  States she was working on the door and slammed her hand in it to help get it into place.  Pain in the right hand since.  Some swelling.  Initially numb.  Now decreased movement.  No other injury.    Past Medical History:  Diagnosis Date   Asthma    Cyclical vomiting with nausea 10/27/2018   ED visits for vomiting and diarrhea since 2009-13 At least 4-5 references witnessing patient inducing vomiting by sticking fingers down throat Has had difficulty obtaining meds through the years GI consult made but never went   Elevated liver enzymes 11/19/2019   Fetal cardiac anomaly affecting pregnancy, antepartum 10/20/2020   Small perimembranous VSD on 10/12/20 fetal echocardiogram   Gestational hypertension 11/17/2019   IOL @ 37 weeks   H/O eclampsia 01/23/2021   Hx of HELLP syndrome, currently pregnant, second trimester 09/16/2020   Induced at 35 wks   Hx of preterm delivery, currently pregnant, second trimester 09/16/2020   35 wk induction due to HELLP   Lump of breast 06/12/2019   Obesity    Ovarian cyst    Sickle cell trait (HCC)    Trichomonas vaginalis (TV) infection 07/06/2021   Test of cure neg    Home Medications Prior to Admission medications   Medication Sig Start Date End Date Taking? Authorizing Provider  methocarbamol (ROBAXIN) 500 MG tablet Take 1 tablet (500 mg total) by mouth every 8 (eight) hours as needed for muscle spasms. 12/26/21  Yes Benjiman Core, MD  acetaminophen (TYLENOL) 325 MG tablet Take 2 tablets (650 mg total) by mouth every 4 (four) hours as needed. Patient not taking: Reported on 11/07/2021 01/04/21 01/04/22  [name], CNM  albuterol (VENTOLIN HFA) 108 (90 Base)  MCG/ACT inhaler Inhale 2 puffs into the lungs every 6 (six) hours as needed for wheezing or shortness of breath. 04/22/20   [name], NP  ibuprofen (ADVIL) 600 MG tablet Take 1 tablet (600 mg total) by mouth every 6 (six) hours. Patient not taking: Reported on 11/22/2021 01/19/21   [name], MD  metroNIDAZOLE (METROGEL) 0.75 % vaginal gel Place 1 Applicatorful vaginally at bedtime. Apply one applicatorful to vagina at bedtime for 5 days 11/24/21   Bernerd Limbo, CNM  naproxen (NAPROSYN) 500 MG tablet Take 1 tablet (500 mg total) by mouth 2 (two) times daily. Patient not taking: Reported on 11/07/2021 09/12/21   Redwine, Madison A, PA-C      Allergies    Penicillins    Review of Systems   Review of Systems  Physical Exam Updated Vital Signs BP 130/90   Pulse 81   Temp 99.1 F (37.3 C) (Oral)   Resp (!) 22   Ht 6\' 1"  (1.854 m)   Wt 117.3 kg   SpO2 98%   BMI 34.12 kg/m  Physical Exam Vitals and nursing note reviewed.  Musculoskeletal:     Comments: Tenderness with swelling over right fifth metacarpal.  Able to flex and extend at MCP DIP and PIP joint.  Sensation intact distally.  No tenderness over wrist.  Does have some tenderness with axial loading.  Neurological:     Mental  Status: She is alert.     ED Results / Procedures / Treatments   Labs (all labs ordered are listed, but only abnormal results are displayed) Labs Reviewed - No data to display  EKG None  Radiology DG Hand Complete Right  Result Date: 12/26/2021 CLINICAL DATA:  Injury, lateral pain. EXAM: RIGHT HAND - COMPLETE 3+ VIEW COMPARISON:  None Available. FINDINGS: Osseous alignment is normal. No fracture line or displaced fracture fragment is seen. Fixed flexion at the fifth PIP joint may indicate ligamentous injury. IMPRESSION: 1. No osseous fracture or dislocation seen. 2. Fixed flexion at the fifth PIP joint may indicate ligamentous injury. Electronically Signed   By: Bary Richard M.D.    On: 12/26/2021 13:43    Procedures Procedures    Medications Ordered in ED Medications  oxyCODONE-acetaminophen (PERCOCET/ROXICET) 5-325 MG per tablet 1 tablet (1 tablet Oral Given 12/26/21 1321)    ED Course/ Medical Decision Making/ A&P                           Medical Decision Making  Patient with hand contusion.  Hit hand against a blunt object.  Differential diagnosis includes fracture, soft tissue injury.  Good range of motion.  Does have swelling but no bony instability.  Does have good flexion extension at the joints in the fifth metacarpal/phalangeal area.  Will immobilize for comfort.  Muscle relaxer and anti-inflammatories as needed.  Instructed on factor potential occult fracture and if symptoms do not improve will follow-up with hand surgery.  Does not appear to require admission to the hospital.  Will discharge        Final Clinical Impression(s) / ED Diagnoses Final diagnoses:  Contusion of right hand, initial encounter    Rx / DC Orders ED Discharge Orders          Ordered    methocarbamol (ROBAXIN) 500 MG tablet  Every 8 hours PRN        12/26/21 1515              Benjiman Core, MD 12/26/21 1516

## 2021-12-26 NOTE — Discharge Instructions (Addendum)
You can wear the splint for a couple days.  The muscle relaxer may help with the pain.  Also Motrin and Tylenol may help.  If the swelling pain does not improve he can follow-up with the hand surgeon.

## 2021-12-26 NOTE — Progress Notes (Signed)
Orthopedic Tech Progress Note Patient Details:  Leslie Yates 1991/03/27 [id number]  Ortho Devices Type of Ortho Device: Ulna gutter splint, Cotton web roll Ortho Device/Splint Location: RUE Ortho Device/Splint Interventions: Ordered, Application, Adjustment   Post Interventions Patient Tolerated: Fair, Well Instructions Provided: Care of device  Donald Pore 12/26/2021, 3:11 PM

## 2021-12-27 ENCOUNTER — Other Ambulatory Visit: Payer: Self-pay | Admitting: *Deleted

## 2021-12-27 NOTE — Patient Outreach (Signed)
Care Coordination  12/27/2021  Leslie Yates [birth year] [id number]   Medicaid Managed Care   Unsuccessful Outreach Note  12/27/2021 Name: Leslie Yates MRN: [id number] DOB: [birth year]  Referred by: [name], MD Reason for referral : Case Closure (RNCM performing Case Closure)   Three unsuccessful telephone outreach attempts have been made. The patient was referred to the case management team for assistance with care management and care coordination. The patient's primary care provider has been notified of our unsuccessful attempts to make or maintain contact with the patient. The care management team is pleased to engage with this patient at any time in the future should he/she be interested in assistance from the care management team.   Follow Up Plan: We have been unable to make contact with the patient for follow up. The care management team is available to follow up with the patient after provider conversation with the patient regarding recommendation for care management engagement and subsequent re-referral to the care management team.   Estanislado Emms RN, BSN [place]  [facility] RN Care Coordinator

## 2022-01-09 ENCOUNTER — Ambulatory Visit (INDEPENDENT_AMBULATORY_CARE_PROVIDER_SITE_OTHER): Payer: Medicaid Other

## 2022-01-09 ENCOUNTER — Telehealth: Payer: Self-pay | Admitting: Neurology

## 2022-01-09 DIAGNOSIS — G43909 Migraine, unspecified, not intractable, without status migrainosus: Secondary | ICD-10-CM | POA: Diagnosis not present

## 2022-01-09 DIAGNOSIS — R519 Headache, unspecified: Secondary | ICD-10-CM | POA: Diagnosis not present

## 2022-01-09 MED ORDER — NORTRIPTYLINE HCL 25 MG PO CAPS: 25.0000 mg | ORAL_CAPSULE | Freq: Every day | ORAL | 3 refills | Status: DC

## 2022-01-09 MED ORDER — METOCLOPRAMIDE HCL 5 MG/ML IJ SOLN
10.0000 mg | Freq: Once | INTRAMUSCULAR | Status: AC
  Administered 2022-01-09: 10 mg via INTRAMUSCULAR

## 2022-01-09 MED ORDER — DIPHENHYDRAMINE HCL 50 MG/ML IJ SOLN
50.0000 mg | Freq: Once | INTRAMUSCULAR | Status: AC
  Administered 2022-01-09: 25 mg via INTRAMUSCULAR

## 2022-01-09 MED ORDER — KETOROLAC TROMETHAMINE 60 MG/2ML IM SOLN
60.0000 mg | Freq: Once | INTRAMUSCULAR | Status: AC
  Administered 2022-01-09: 60 mg via INTRAMUSCULAR

## 2022-01-09 MED ORDER — SUMATRIPTAN SUCCINATE 100 MG PO TABS: ORAL_TABLET | ORAL | 11 refills | Status: DC

## 2022-01-09 NOTE — Telephone Encounter (Signed)
Pt called in stating she has had a migraine for the last 6 days and she cannot get rid of it. She has not taken any Tylenol today. She is wondering if she can get a headache cocktail?

## 2022-01-09 NOTE — Telephone Encounter (Signed)
Pt called an advised she can get a headache cocktail, and that she has to have a driver and they have to come up to the office with her and she needs to be here by 4 pm

## 2022-01-09 NOTE — Telephone Encounter (Signed)
Ok for migraine cocktail, as long as she has a Hospital doctor. Thanks

## 2022-01-09 NOTE — Addendum Note (Signed)
Addended by: [name] on: 01/09/2022 03:47 PM   Modules accepted: Orders

## 2022-01-11 DIAGNOSIS — R062 Wheezing: Secondary | ICD-10-CM | POA: Diagnosis not present

## 2022-01-11 DIAGNOSIS — B349 Viral infection, unspecified: Secondary | ICD-10-CM | POA: Diagnosis not present

## 2022-01-21 ENCOUNTER — Emergency Department (HOSPITAL_COMMUNITY)
Admission: EM | Admit: 2022-01-21 | Discharge: 2022-01-21 | Disposition: A | Discharge status: Home / Self Care | Payer: Medicaid Other | Attending: Emergency Medicine | Admitting: Emergency Medicine

## 2022-01-21 ENCOUNTER — Emergency Department (HOSPITAL_COMMUNITY): Payer: Medicaid Other

## 2022-01-21 DIAGNOSIS — W503XXA Accidental bite by another person, initial encounter: Secondary | ICD-10-CM

## 2022-01-21 DIAGNOSIS — J45909 Unspecified asthma, uncomplicated: Secondary | ICD-10-CM | POA: Diagnosis not present

## 2022-01-21 DIAGNOSIS — S61451A Open bite of right hand, initial encounter: Secondary | ICD-10-CM | POA: Diagnosis not present

## 2022-01-21 DIAGNOSIS — S6991XA Unspecified injury of right wrist, hand and finger(s), initial encounter: Secondary | ICD-10-CM | POA: Diagnosis present

## 2022-01-21 DIAGNOSIS — S61411A Laceration without foreign body of right hand, initial encounter: Secondary | ICD-10-CM | POA: Insufficient documentation

## 2022-01-21 MED ORDER — SULFAMETHOXAZOLE-TRIMETHOPRIM 800-160 MG PO TABS: 2.0000 | ORAL_TABLET | Freq: Two times a day (BID) | ORAL | 0 refills | Status: AC

## 2022-01-21 MED ORDER — CLINDAMYCIN HCL 150 MG PO CAPS: 450.0000 mg | ORAL_CAPSULE | Freq: Three times a day (TID) | ORAL | 0 refills | Status: AC
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
## 2022-03-07 DIAGNOSIS — R0981 Nasal congestion: Secondary | ICD-10-CM | POA: Diagnosis not present

## 2022-03-07 DIAGNOSIS — Z20822 Contact with and (suspected) exposure to covid-19: Secondary | ICD-10-CM | POA: Diagnosis not present

## 2022-03-07 DIAGNOSIS — R058 Other specified cough: Secondary | ICD-10-CM | POA: Diagnosis not present

## 2022-03-07 DIAGNOSIS — R051 Acute cough: Secondary | ICD-10-CM | POA: Diagnosis not present

## 2022-03-13 ENCOUNTER — Ambulatory Visit (INDEPENDENT_AMBULATORY_CARE_PROVIDER_SITE_OTHER): Payer: Medicaid Other

## 2022-03-13 ENCOUNTER — Other Ambulatory Visit: Payer: Self-pay

## 2022-03-13 ENCOUNTER — Other Ambulatory Visit (HOSPITAL_COMMUNITY)
Admission: RE | Admit: 2022-03-13 | Discharge: 2022-03-13 | Disposition: A | Discharge status: Home / Self Care | Payer: Medicaid Other | Source: Ambulatory Visit | Attending: Family Medicine | Admitting: Family Medicine

## 2022-03-13 VITALS — BP 132/73 | HR 63 | Wt 264.4 lb

## 2022-03-13 DIAGNOSIS — Z113 Encounter for screening for infections with a predominantly sexual mode of transmission: Secondary | ICD-10-CM

## 2022-03-13 DIAGNOSIS — R319 Hematuria, unspecified: Secondary | ICD-10-CM

## 2022-03-13 DIAGNOSIS — R300 Dysuria: Secondary | ICD-10-CM | POA: Diagnosis not present

## 2022-03-13 DIAGNOSIS — N898 Other specified noninflammatory disorders of vagina: Secondary | ICD-10-CM | POA: Insufficient documentation

## 2022-03-13 LAB — POCT URINALYSIS DIP (DEVICE)
Bilirubin Urine: NEGATIVE
Glucose, UA: NEGATIVE mg/dL
Ketones, ur: NEGATIVE mg/dL
Leukocytes,Ua: NEGATIVE
Nitrite: NEGATIVE
Protein, ur: NEGATIVE mg/dL
Specific Gravity, Urine: 1.02 (ref 1.005–1.030)
Urobilinogen, UA: 0.2 mg/dL (ref 0.0–1.0)
pH: 6 (ref 5.0–8.0)
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
## 2022-03-14 LAB — CERVICOVAGINAL ANCILLARY ONLY
Bacterial Vaginitis (gardnerella): POSITIVE — AB
Candida Glabrata: NEGATIVE
Candida Vaginitis: NEGATIVE
Chlamydia: NEGATIVE
Comment: NEGATIVE
Comment: NEGATIVE
Comment: NEGATIVE
Comment: NEGATIVE
Comment: NEGATIVE
Comment: NORMAL
Neisseria Gonorrhea: NEGATIVE
Trichomonas: NEGATIVE

## 2022-03-15 LAB — URINE CULTURE

## 2022-03-17 ENCOUNTER — Other Ambulatory Visit: Payer: Self-pay | Admitting: Advanced Practice Midwife

## 2022-03-17 DIAGNOSIS — B9689 Other specified bacterial agents as the cause of diseases classified elsewhere: Secondary | ICD-10-CM

## 2022-03-17 MED ORDER — METRONIDAZOLE 500 MG PO TABS: 500.0000 mg | ORAL_TABLET | Freq: Two times a day (BID) | ORAL | 0 refills | Status: DC

## 2022-03-20 ENCOUNTER — Telehealth: Payer: Self-pay | Admitting: Family Medicine

## 2022-03-20 DIAGNOSIS — B9689 Other specified bacterial agents as the cause of diseases classified elsewhere: Secondary | ICD-10-CM

## 2022-03-20 NOTE — Telephone Encounter (Signed)
Patient called in saying she went to pick up her flagyl at her pharmacy and they never had a record of any medicine for her. She wants the prescription sent to [facility] on [address] and as soon as possible.

## 2022-03-21 MED ORDER — METRONIDAZOLE 500 MG PO TABS: 500.0000 mg | ORAL_TABLET | Freq: Two times a day (BID) | ORAL | 0 refills | Status: DC

## 2022-03-21 NOTE — Telephone Encounter (Signed)
Per chart review, Rx printed instead of going through electronically. Med reordered. Called & informed patient of Rx sent to pharmacy. Patient verbalized understanding.

## 2022-04-12 ENCOUNTER — Ambulatory Visit (INDEPENDENT_AMBULATORY_CARE_PROVIDER_SITE_OTHER): Payer: Medicaid Other | Admitting: Neurology

## 2022-04-12 ENCOUNTER — Encounter: Payer: Self-pay | Admitting: Neurology

## 2022-04-12 VITALS — BP 125/76 | HR 66 | Resp 20 | Ht 72.0 in | Wt 266.0 lb

## 2022-04-12 DIAGNOSIS — G43909 Migraine, unspecified, not intractable, without status migrainosus: Secondary | ICD-10-CM | POA: Diagnosis not present

## 2022-04-12 DIAGNOSIS — F445 Conversion disorder with seizures or convulsions: Secondary | ICD-10-CM | POA: Diagnosis not present

## 2022-04-12 MED ORDER — SUMATRIPTAN SUCCINATE 100 MG PO TABS: ORAL_TABLET | ORAL | 11 refills | Status: DC

## 2022-04-12 MED ORDER — NORTRIPTYLINE HCL 25 MG PO CAPS: 25.0000 mg | ORAL_CAPSULE | Freq: Every day | ORAL | 3 refills | Status: DC

## 2022-04-12 NOTE — Patient Instructions (Signed)
Good to see you.  Let's reduce the nortriptyline 25mg : take 1 capsule at bedtime and see if this helps prevent the migraines but does not cause drowsiness as much  2. Refills sent for sumatriptan as needed for migraine. Do not take more than 3 a week  3. Follow-up in 6 months, call for any changes.
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
## 2022-04-14 DIAGNOSIS — R051 Acute cough: Secondary | ICD-10-CM | POA: Diagnosis not present

## 2022-04-30 ENCOUNTER — Ambulatory Visit (INDEPENDENT_AMBULATORY_CARE_PROVIDER_SITE_OTHER): Payer: Medicaid Other

## 2022-04-30 ENCOUNTER — Other Ambulatory Visit (HOSPITAL_COMMUNITY)
Admission: RE | Admit: 2022-04-30 | Discharge: 2022-04-30 | Disposition: A | Discharge status: Home / Self Care | Payer: Medicaid Other | Source: Ambulatory Visit | Attending: Family Medicine | Admitting: Family Medicine

## 2022-04-30 VITALS — BP 111/68 | HR 58 | Ht 75.0 in | Wt 266.6 lb

## 2022-04-30 DIAGNOSIS — N898 Other specified noninflammatory disorders of vagina: Secondary | ICD-10-CM | POA: Diagnosis not present

## 2022-04-30 NOTE — Progress Notes (Signed)
After she reports completing her tx for BV pt states that she is having burning sensation, and pain in the abdomin.  Pt also c/o vaginal odor.  Pt wants to know if she has BV.  Pt explained how to obtain self swab and that we will call with abnormal results.    Leslie Yates  04/30/22

## 2022-05-01 LAB — CERVICOVAGINAL ANCILLARY ONLY
Bacterial Vaginitis (gardnerella): POSITIVE — AB
Candida Glabrata: NEGATIVE
Candida Vaginitis: NEGATIVE
Chlamydia: NEGATIVE
Comment: NEGATIVE
Comment: NEGATIVE
Comment: NEGATIVE
Comment: NEGATIVE
Comment: NEGATIVE
Comment: NORMAL
Neisseria Gonorrhea: NEGATIVE
Trichomonas: NEGATIVE

## 2022-05-03 ENCOUNTER — Other Ambulatory Visit: Payer: Self-pay | Admitting: Lactation Services

## 2022-05-03 ENCOUNTER — Encounter: Payer: Self-pay | Admitting: Obstetrics and Gynecology

## 2022-05-03 MED ORDER — METRONIDAZOLE 500 MG PO TABS: 500.0000 mg | ORAL_TABLET | Freq: Two times a day (BID) | ORAL | 0 refills | Status: DC

## 2022-05-16 ENCOUNTER — Telehealth: Payer: Self-pay | Admitting: Anesthesiology

## 2022-05-16 ENCOUNTER — Ambulatory Visit (INDEPENDENT_AMBULATORY_CARE_PROVIDER_SITE_OTHER): Payer: Medicaid Other

## 2022-05-16 DIAGNOSIS — G43909 Migraine, unspecified, not intractable, without status migrainosus: Secondary | ICD-10-CM

## 2022-05-16 DIAGNOSIS — R519 Headache, unspecified: Secondary | ICD-10-CM

## 2022-05-16 MED ORDER — DIPHENHYDRAMINE HCL 50 MG/ML IJ SOLN
50.0000 mg | Freq: Once | INTRAMUSCULAR | Status: AC
  Administered 2022-05-16: 25 mg via INTRAMUSCULAR

## 2022-05-16 MED ORDER — METOCLOPRAMIDE HCL 5 MG/ML IJ SOLN
10.0000 mg | Freq: Once | INTRAMUSCULAR | Status: AC
  Administered 2022-05-16: 10 mg via INTRAMUSCULAR

## 2022-05-16 MED ORDER — KETOROLAC TROMETHAMINE 60 MG/2ML IM SOLN
60.0000 mg | Freq: Once | INTRAMUSCULAR | Status: AC
  Administered 2022-05-16: 60 mg via INTRAMUSCULAR

## 2022-05-16 NOTE — Progress Notes (Signed)
Pt came in for a headache cocktail she tolerated it well, she had a driver with her in the office ,

## 2022-05-16 NOTE — Telephone Encounter (Signed)
Pt called to see if she can come in today to get a headache cocktail today. She has been taking her medication with no relief. Can she come?

## 2022-05-16 NOTE — Telephone Encounter (Signed)
Office closes at 2pm, if she can get here with driver before office closes, that is fine, thanks

## 2022-07-09 DIAGNOSIS — K5900 Constipation, unspecified: Secondary | ICD-10-CM | POA: Diagnosis not present

## 2022-07-09 DIAGNOSIS — N898 Other specified noninflammatory disorders of vagina: Secondary | ICD-10-CM | POA: Diagnosis not present

## 2022-07-09 DIAGNOSIS — R300 Dysuria: Secondary | ICD-10-CM | POA: Diagnosis not present

## 2022-09-04 DIAGNOSIS — S0501XA Injury of conjunctiva and corneal abrasion without foreign body, right eye, initial encounter: Secondary | ICD-10-CM | POA: Diagnosis not present

## 2022-09-04 DIAGNOSIS — H5711 Ocular pain, right eye: Secondary | ICD-10-CM | POA: Diagnosis not present

## 2022-09-04 DIAGNOSIS — X58XXXA Exposure to other specified factors, initial encounter: Secondary | ICD-10-CM | POA: Diagnosis not present

## 2022-09-09 ENCOUNTER — Other Ambulatory Visit: Payer: Self-pay

## 2022-09-09 ENCOUNTER — Encounter (HOSPITAL_COMMUNITY): Payer: Self-pay

## 2022-09-09 ENCOUNTER — Emergency Department (HOSPITAL_COMMUNITY)
Admission: EM | Admit: 2022-09-09 | Discharge: 2022-09-09 | Disposition: A | Discharge status: Home / Self Care | Payer: Medicaid Other | Attending: Emergency Medicine | Admitting: Emergency Medicine

## 2022-09-09 DIAGNOSIS — J020 Streptococcal pharyngitis: Secondary | ICD-10-CM | POA: Diagnosis not present

## 2022-09-09 DIAGNOSIS — D72829 Elevated white blood cell count, unspecified: Secondary | ICD-10-CM | POA: Insufficient documentation

## 2022-09-09 DIAGNOSIS — J039 Acute tonsillitis, unspecified: Secondary | ICD-10-CM

## 2022-09-09 DIAGNOSIS — Z1152 Encounter for screening for COVID-19: Secondary | ICD-10-CM | POA: Insufficient documentation

## 2022-09-09 DIAGNOSIS — J029 Acute pharyngitis, unspecified: Secondary | ICD-10-CM | POA: Diagnosis present

## 2022-09-09 DIAGNOSIS — J0300 Acute streptococcal tonsillitis, unspecified: Secondary | ICD-10-CM | POA: Diagnosis not present

## 2022-09-09 DIAGNOSIS — R9431 Abnormal electrocardiogram [ECG] [EKG]: Secondary | ICD-10-CM | POA: Diagnosis not present

## 2022-09-09 LAB — RESP PANEL BY RT-PCR (RSV, FLU A&B, COVID)  RVPGX2
Influenza A by PCR: NEGATIVE
Influenza B by PCR: NEGATIVE
Resp Syncytial Virus by PCR: NEGATIVE
SARS Coronavirus 2 by RT PCR: NEGATIVE

## 2022-09-09 LAB — CBC WITH DIFFERENTIAL/PLATELET
Abs Immature Granulocytes: 0.1 10*3/uL — ABNORMAL HIGH (ref 0.00–0.07)
Basophils Absolute: 0.1 10*3/uL (ref 0.0–0.1)
Basophils Relative: 0 %
Eosinophils Absolute: 0 10*3/uL (ref 0.0–0.5)
Eosinophils Relative: 0 %
HCT: 38.2 % (ref 36.0–46.0)
Hemoglobin: 13.5 g/dL (ref 12.0–15.0)
Immature Granulocytes: 1 %
Lymphocytes Relative: 4 %
Lymphs Abs: 0.7 10*3/uL (ref 0.7–4.0)
MCH: 31 pg (ref 26.0–34.0)
MCHC: 35.3 g/dL (ref 30.0–36.0)
MCV: 87.6 fL (ref 80.0–100.0)
Monocytes Absolute: 0.8 10*3/uL (ref 0.1–1.0)
Monocytes Relative: 4 %
Neutro Abs: 17.2 10*3/uL — ABNORMAL HIGH (ref 1.7–7.7)
Neutrophils Relative %: 91 %
Platelets: 183 10*3/uL (ref 150–400)
RBC: 4.36 MIL/uL (ref 3.87–5.11)
RDW: 11.6 % (ref 11.5–15.5)
WBC: 18.8 10*3/uL — ABNORMAL HIGH (ref 4.0–10.5)
nRBC: 0 % (ref 0.0–0.2)

## 2022-09-09 LAB — BASIC METABOLIC PANEL
Anion gap: 12 (ref 5–15)
BUN: 6 mg/dL (ref 6–20)
CO2: 20 mmol/L — ABNORMAL LOW (ref 22–32)
Calcium: 8.7 mg/dL — ABNORMAL LOW (ref 8.9–10.3)
Chloride: 105 mmol/L (ref 98–111)
Creatinine, Ser: 0.75 mg/dL (ref 0.44–1.00)
GFR, Estimated: >60 mL/min (ref >60)
Glucose, Bld: 109 mg/dL — ABNORMAL HIGH (ref 70–99)
Potassium: 3.3 mmol/L — ABNORMAL LOW (ref 3.5–5.1)
Sodium: 137 mmol/L (ref 135–145)

## 2022-09-09 LAB — GROUP A STREP BY PCR: Group A Strep by PCR: DETECTED — AB

## 2022-09-09 MED ORDER — DEXAMETHASONE SODIUM PHOSPHATE 10 MG/ML IJ SOLN
10.0000 mg | Freq: Once | INTRAMUSCULAR | Status: AC
  Administered 2022-09-09: 10 mg via INTRAVENOUS
  Filled 2022-09-09: qty 1

## 2022-09-09 MED ORDER — IBUPROFEN 400 MG PO TABS
600.0000 mg | ORAL_TABLET | Freq: Once | ORAL | Status: AC
  Administered 2022-09-09: 600 mg via ORAL
  Filled 2022-09-09: qty 1

## 2022-09-09 MED ORDER — AMOXICILLIN-POT CLAVULANATE 875-125 MG PO TABS: 1.0000 | ORAL_TABLET | Freq: Two times a day (BID) | ORAL | 0 refills | Status: DC

## 2022-09-09 MED ORDER — LACTATED RINGERS IV BOLUS
1000.0000 mL | Freq: Once | INTRAVENOUS | Status: AC
  Administered 2022-09-09: 1000 mL via INTRAVENOUS

## 2022-09-09 MED ORDER — AMOXICILLIN-POT CLAVULANATE 875-125 MG PO TABS
1.0000 | ORAL_TABLET | Freq: Once | ORAL | Status: AC
  Administered 2022-09-09: 1 via ORAL
  Filled 2022-09-09: qty 1

## 2022-09-09 NOTE — ED Triage Notes (Addendum)
Pt arrives POV with complaints of difficulty breathing feeling like her chest and throat are "constricting" x 12 hours. Pt visitor states that she had a fever this am. Pt states that her daughter is sick as well.  No meds taken prior to arrival. Denies chest pain.

## 2022-09-09 NOTE — Discharge Instructions (Addendum)
Your workup today showed you have strep.  We will cover you for tonsillitis.  You received a dose of dexamethasone or steroids in the emergency department.  You received your first dose of antibiotic in the emergency department.  Continue taking the antibiotic as prescribed.  You can use cough drops, Chloraseptic spray, warm salt water gargles, Tylenol and ibuprofen to help with the pain otherwise.  For any concerning symptoms, difficulty swallowing please return to the emergency department.

## 2022-09-09 NOTE — ED Provider Notes (Signed)
[facility] Provider Note   CSN: [id number] Arrival date & time: 09/09/22  1200     History  Chief Complaint  Patient presents with   Sore Throat   Shortness of Breath    Leslie Yates is a 32 y.o. female.  32 year old female presents today for evaluation of sore throat, cough, congestion.  Denies chest pain, shortness of breath.  Her daughter has been sick with URI symptoms over the past several days.  She states she has not been able to swallow and has not had any significant p.o. intake in the past 12 to 16 hours.  Is tolerating p.o. secretions.  The history is provided by the patient. No language interpreter was used.       Home Medications Prior to Admission medications   Medication Sig Start Date End Date Taking? Authorizing Provider  metroNIDAZOLE (FLAGYL) 500 MG tablet Take 1 tablet (500 mg total) by mouth 2 (two) times daily. 05/03/22   Leftwich-Kirby, Kathie Dike, CNM  albuterol (VENTOLIN HFA) 108 (90 Base) MCG/ACT inhaler Inhale 2 puffs into the lungs every 6 (six) hours as needed for wheezing or shortness of breath. 04/22/20   Kerin Perna, NP  ibuprofen (ADVIL) 600 MG tablet Take 1 tablet (600 mg total) by mouth every 6 (six) hours. Patient not taking: Reported on 11/22/2021 01/19/21   Griffin Basil, MD  levonorgestrel (LILETTA, 52 MG,) 20.1 MCG/DAY IUD IUD 1 each by Intrauterine route once.    [provider]  methocarbamol (ROBAXIN) 500 MG tablet Take 1 tablet (500 mg total) by mouth every 8 (eight) hours as needed for muscle spasms. Patient not taking: Reported on 03/13/2022 12/26/21   Davonna Belling, MD  nortriptyline (PAMELOR) 25 MG capsule Take 1 capsule (25 mg total) by mouth at bedtime. 04/12/22   Cameron Sprang, MD  SUMAtriptan (IMITREX) 100 MG tablet Take 1 tablet at onset of migraine. May repeat in 2 hours if headache persists or recurs. Do not take more than 3 a week 04/12/22   Cameron Sprang, MD       Allergies    Penicillins    Review of Systems   Review of Systems  Constitutional:  Negative for chills and fever.  HENT:  Positive for congestion, sore throat and trouble swallowing.   Respiratory:  Positive for cough. Negative for shortness of breath.   Cardiovascular:  Negative for chest pain.  Gastrointestinal:  Negative for abdominal pain, nausea and vomiting.  Neurological:  Negative for light-headedness.  All other systems reviewed and are negative.   Physical Exam Updated Vital Signs BP (!) 149/89   Pulse 69   Temp 99.1 F (37.3 C) (Oral)   Resp 15   Ht 6' (1.829 m)   Wt 126.6 kg   SpO2 97%   BMI 37.84 kg/m  Physical Exam Vitals and nursing note reviewed.  Constitutional:      General: She is not in acute distress.    Appearance: Normal appearance. She is not ill-appearing.  HENT:     Head: Normocephalic and atraumatic.     Nose: Nose normal.     Mouth/Throat:     Mouth: Mucous membranes are moist.     Pharynx: Posterior oropharyngeal erythema present. No oropharyngeal exudate.     Comments: No evidence of retropharyngeal abscess, peritonsillar abscess, Ludwig's angina. Eyes:     General: No scleral icterus.    Extraocular Movements: Extraocular movements intact.  Conjunctiva/sclera: Conjunctivae normal.  Cardiovascular:     Rate and Rhythm: Normal rate and regular rhythm.     Pulses: Normal pulses.  Pulmonary:     Effort: Pulmonary effort is normal. No respiratory distress.     Breath sounds: Normal breath sounds. No wheezing or rales.  Abdominal:     General: There is no distension.     Tenderness: There is no abdominal tenderness.  Musculoskeletal:        General: Normal range of motion.     Cervical back: Normal range of motion.  Skin:    General: Skin is warm and dry.  Neurological:     General: No focal deficit present.     Mental Status: She is alert. Mental status is at baseline.     ED Results / Procedures / Treatments    Labs (all labs ordered are listed, but only abnormal results are displayed) Labs Reviewed  GROUP A STREP BY PCR  RESP PANEL BY RT-PCR (RSV, FLU A&B, COVID)  [id number]  CBC WITH DIFFERENTIAL/PLATELET  BASIC METABOLIC PANEL    EKG None  Radiology No results found.  Procedures Procedures    Medications Ordered in ED Medications  lactated ringers bolus 1,000 mL (has no administration in time range)    ED Course/ Medical Decision Making/ A&P                             Medical Decision Making Amount and/or Complexity of Data Reviewed Labs: ordered.  Risk Prescription drug management.   32 year old female presents today for evaluation of sore throat, cough, congestion.  Denies chest pain, shortness of breath.  Her daughter has been sick with URI symptoms over the past several days.  She has been able to tolerate some p.o. intake in the emergency department.  Afebrile.  Does have erythematous pharynx.  Otherwise no signs of an abscess.  Will obtain blood work, strep swab, respiratory panel. Respiratory panel negative.  Strep swab positive.  Leukocytosis present.  Will cover for tonsillitis with Augmentin, dexamethasone in the emergency department.  Discussed increased hydration and symptomatic management.  Patient is agreeable.  She is protecting her airway.  Strict return precaution discussed.  Patient voices understanding and is in agreement with plan.  Final Clinical Impression(s) / ED Diagnoses Final diagnoses:  Strep pharyngitis  Tonsillitis    Rx / DC Orders ED Discharge Orders          Ordered    amoxicillin-clavulanate (AUGMENTIN) 875-125 MG tablet  Every 12 hours        09/09/22 1418              Evlyn Courier, PA-C 09/09/22 [place], [place], DO 09/09/22 1514

## 2022-10-16 ENCOUNTER — Encounter: Payer: Self-pay | Admitting: Neurology

## 2022-10-19 DIAGNOSIS — M25531 Pain in right wrist: Secondary | ICD-10-CM | POA: Diagnosis not present

## 2022-10-19 DIAGNOSIS — J45909 Unspecified asthma, uncomplicated: Secondary | ICD-10-CM | POA: Diagnosis not present

## 2022-10-19 DIAGNOSIS — M25571 Pain in right ankle and joints of right foot: Secondary | ICD-10-CM | POA: Diagnosis not present

## 2022-10-24 NOTE — Telephone Encounter (Signed)
F/u   Patient is follow up on previous messages asking for a call back to discuss

## 2022-11-01 ENCOUNTER — Ambulatory Visit: Payer: Medicaid Other | Admitting: Neurology

## 2022-11-01 ENCOUNTER — Encounter: Payer: Self-pay | Admitting: Neurology

## 2022-11-01 VITALS — BP 133/69 | HR 65 | Ht 72.0 in | Wt 276.8 lb

## 2022-11-01 DIAGNOSIS — G43909 Migraine, unspecified, not intractable, without status migrainosus: Secondary | ICD-10-CM | POA: Diagnosis not present

## 2022-11-01 DIAGNOSIS — F445 Conversion disorder with seizures or convulsions: Secondary | ICD-10-CM

## 2022-11-01 MED ORDER — NORTRIPTYLINE HCL 25 MG PO CAPS: 25.0000 mg | ORAL_CAPSULE | Freq: Every day | ORAL | 3 refills | Status: DC

## 2022-11-01 MED ORDER — SUMATRIPTAN SUCCINATE 100 MG PO TABS: ORAL_TABLET | ORAL | 11 refills | Status: DC

## 2022-11-01 NOTE — Patient Instructions (Signed)
Good to see you! Continue all your medications. Follow-up in 6 months, call for any changes. 
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
## 2022-11-08 DIAGNOSIS — J069 Acute upper respiratory infection, unspecified: Secondary | ICD-10-CM | POA: Diagnosis not present

## 2022-11-13 DIAGNOSIS — J45901 Unspecified asthma with (acute) exacerbation: Secondary | ICD-10-CM | POA: Diagnosis not present

## 2022-11-13 DIAGNOSIS — R062 Wheezing: Secondary | ICD-10-CM | POA: Diagnosis not present

## 2022-11-13 DIAGNOSIS — J028 Acute pharyngitis due to other specified organisms: Secondary | ICD-10-CM | POA: Diagnosis not present

## 2022-11-13 DIAGNOSIS — R051 Acute cough: Secondary | ICD-10-CM | POA: Diagnosis not present

## 2022-11-13 DIAGNOSIS — J029 Acute pharyngitis, unspecified: Secondary | ICD-10-CM | POA: Diagnosis not present

## 2022-11-13 DIAGNOSIS — Z20822 Contact with and (suspected) exposure to covid-19: Secondary | ICD-10-CM | POA: Diagnosis not present

## 2022-11-13 DIAGNOSIS — R0981 Nasal congestion: Secondary | ICD-10-CM | POA: Diagnosis not present

## 2023-01-02 ENCOUNTER — Ambulatory Visit (INDEPENDENT_AMBULATORY_CARE_PROVIDER_SITE_OTHER): Payer: Medicaid Other

## 2023-01-02 DIAGNOSIS — G43909 Migraine, unspecified, not intractable, without status migrainosus: Secondary | ICD-10-CM

## 2023-01-02 MED ORDER — METOCLOPRAMIDE HCL 5 MG/ML IJ SOLN
10.0000 mg | Freq: Once | INTRAMUSCULAR | Status: AC
  Administered 2023-01-02: 10 mg via INTRAMUSCULAR

## 2023-01-02 MED ORDER — KETOROLAC TROMETHAMINE 60 MG/2ML IM SOLN
60.0000 mg | Freq: Once | INTRAMUSCULAR | Status: AC
  Administered 2023-01-02: 60 mg via INTRAMUSCULAR

## 2023-01-02 MED ORDER — DIPHENHYDRAMINE HCL 50 MG/ML IJ SOLN
50.0000 mg | Freq: Once | INTRAMUSCULAR | Status: AC
  Administered 2023-01-02: 25 mg via INTRAMUSCULAR

## 2023-01-27 ENCOUNTER — Other Ambulatory Visit: Payer: Self-pay

## 2023-01-27 ENCOUNTER — Emergency Department (HOSPITAL_COMMUNITY)
Admission: EM | Admit: 2023-01-27 | Discharge: 2023-01-27 | Disposition: A | Discharge status: Home / Self Care | Payer: Medicaid Other | Attending: Emergency Medicine | Admitting: Emergency Medicine

## 2023-01-27 ENCOUNTER — Emergency Department (HOSPITAL_COMMUNITY): Payer: Medicaid Other

## 2023-01-27 ENCOUNTER — Encounter (HOSPITAL_COMMUNITY): Payer: Self-pay

## 2023-01-27 DIAGNOSIS — Z1152 Encounter for screening for COVID-19: Secondary | ICD-10-CM | POA: Diagnosis not present

## 2023-01-27 DIAGNOSIS — J189 Pneumonia, unspecified organism: Secondary | ICD-10-CM

## 2023-01-27 DIAGNOSIS — J9811 Atelectasis: Secondary | ICD-10-CM | POA: Diagnosis not present

## 2023-01-27 DIAGNOSIS — R0602 Shortness of breath: Secondary | ICD-10-CM | POA: Diagnosis not present

## 2023-01-27 DIAGNOSIS — J45909 Unspecified asthma, uncomplicated: Secondary | ICD-10-CM | POA: Diagnosis not present

## 2023-01-27 DIAGNOSIS — D72829 Elevated white blood cell count, unspecified: Secondary | ICD-10-CM | POA: Insufficient documentation

## 2023-01-27 DIAGNOSIS — E876 Hypokalemia: Secondary | ICD-10-CM | POA: Diagnosis not present

## 2023-01-27 DIAGNOSIS — M791 Myalgia, unspecified site: Secondary | ICD-10-CM

## 2023-01-27 DIAGNOSIS — R519 Headache, unspecified: Secondary | ICD-10-CM | POA: Diagnosis present

## 2023-01-27 DIAGNOSIS — R600 Localized edema: Secondary | ICD-10-CM | POA: Insufficient documentation

## 2023-01-27 DIAGNOSIS — J181 Lobar pneumonia, unspecified organism: Secondary | ICD-10-CM | POA: Insufficient documentation

## 2023-01-27 DIAGNOSIS — J168 Pneumonia due to other specified infectious organisms: Secondary | ICD-10-CM | POA: Diagnosis not present

## 2023-01-27 DIAGNOSIS — R918 Other nonspecific abnormal finding of lung field: Secondary | ICD-10-CM | POA: Diagnosis not present

## 2023-01-27 DIAGNOSIS — I517 Cardiomegaly: Secondary | ICD-10-CM | POA: Diagnosis not present

## 2023-01-27 DIAGNOSIS — R0789 Other chest pain: Secondary | ICD-10-CM | POA: Diagnosis not present

## 2023-01-27 DIAGNOSIS — I10 Essential (primary) hypertension: Secondary | ICD-10-CM | POA: Diagnosis not present

## 2023-01-27 LAB — CBG MONITORING, ED: Glucose-Capillary: 117 mg/dL — ABNORMAL HIGH (ref 70–99)

## 2023-01-27 LAB — CBC
HCT: 36.1 % (ref 36.0–46.0)
Hemoglobin: 12.7 g/dL (ref 12.0–15.0)
MCH: 30.5 pg (ref 26.0–34.0)
MCHC: 35.2 g/dL (ref 30.0–36.0)
MCV: 86.6 fL (ref 80.0–100.0)
Platelets: 212 10*3/uL (ref 150–400)
RBC: 4.17 MIL/uL (ref 3.87–5.11)
RDW: 12.2 % (ref 11.5–15.5)
WBC: 21.3 10*3/uL — ABNORMAL HIGH (ref 4.0–10.5)
nRBC: 0 % (ref 0.0–0.2)

## 2023-01-27 LAB — URINALYSIS, ROUTINE W REFLEX MICROSCOPIC
Bilirubin Urine: NEGATIVE
Glucose, UA: NEGATIVE mg/dL
Ketones, ur: NEGATIVE mg/dL
Nitrite: NEGATIVE
Protein, ur: 100 mg/dL — AB
Specific Gravity, Urine: 1.016 (ref 1.005–1.030)
pH: 5 (ref 5.0–8.0)

## 2023-01-27 LAB — RESP PANEL BY RT-PCR (RSV, FLU A&B, COVID)  RVPGX2
Influenza A by PCR: NEGATIVE
Influenza B by PCR: NEGATIVE
Resp Syncytial Virus by PCR: NEGATIVE
SARS Coronavirus 2 by RT PCR: NEGATIVE

## 2023-01-27 LAB — BASIC METABOLIC PANEL
Anion gap: 11 (ref 5–15)
BUN: 10 mg/dL (ref 6–20)
CO2: 21 mmol/L — ABNORMAL LOW (ref 22–32)
Calcium: 9 mg/dL (ref 8.9–10.3)
Chloride: 107 mmol/L (ref 98–111)
Creatinine, Ser: 0.88 mg/dL (ref 0.44–1.00)
GFR, Estimated: >60 mL/min (ref >60)
Glucose, Bld: 120 mg/dL — ABNORMAL HIGH (ref 70–99)
Potassium: 3.2 mmol/L — ABNORMAL LOW (ref 3.5–5.1)
Sodium: 139 mmol/L (ref 135–145)

## 2023-01-27 LAB — HCG, SERUM, QUALITATIVE: Preg, Serum: NEGATIVE

## 2023-01-27 LAB — D-DIMER, QUANTITATIVE: D-Dimer, Quant: 0.68 ug/mL-FEU — ABNORMAL HIGH (ref 0.00–0.50)

## 2023-01-27 LAB — BRAIN NATRIURETIC PEPTIDE: B Natriuretic Peptide: 76.4 pg/mL (ref 0.0–100.0)

## 2023-01-27 LAB — I-STAT CG4 LACTIC ACID, ED
Lactic Acid, Venous: 1.4 mmol/L (ref 0.5–1.9)
Lactic Acid, Venous: 0.6 mmol/L (ref 0.5–1.9)

## 2023-01-27 LAB — GROUP A STREP BY PCR: Group A Strep by PCR: NOT DETECTED

## 2023-01-27 LAB — TROPONIN I (HIGH SENSITIVITY)
Troponin I (High Sensitivity): 10 ng/L (ref <18)
Troponin I (High Sensitivity): 5 ng/L (ref <18)

## 2023-01-27 MED ORDER — IOHEXOL 350 MG/ML SOLN
75.0000 mL | Freq: Once | INTRAVENOUS | Status: AC | PRN
  Administered 2023-01-27: 75 mL via INTRAVENOUS

## 2023-01-27 MED ORDER — IBUPROFEN 600 MG PO TABS: 600.0000 mg | ORAL_TABLET | Freq: Four times a day (QID) | ORAL | 0 refills | Status: AC

## 2023-01-27 MED ORDER — DOXYCYCLINE HYCLATE 100 MG PO CAPS: 100.0000 mg | ORAL_CAPSULE | Freq: Two times a day (BID) | ORAL | 0 refills | Status: AC

## 2023-01-27 MED ORDER — FENTANYL CITRATE PF 50 MCG/ML IJ SOSY
50.0000 ug | PREFILLED_SYRINGE | Freq: Once | INTRAMUSCULAR | Status: AC
  Administered 2023-01-27: 50 ug via INTRAVENOUS
  Filled 2023-01-27: qty 1

## 2023-01-27 MED ORDER — ACETAMINOPHEN ER 650 MG PO TBCR: 650.0000 mg | EXTENDED_RELEASE_TABLET | Freq: Three times a day (TID) | ORAL | 0 refills | Status: AC

## 2023-01-27 MED ORDER — SODIUM CHLORIDE 0.9 % IV BOLUS
1000.0000 mL | Freq: Once | INTRAVENOUS | Status: AC
  Administered 2023-01-27: 1000 mL via INTRAVENOUS

## 2023-01-27 MED ORDER — LIDOCAINE VISCOUS HCL 2 % MT SOLN
15.0000 mL | Freq: Once | OROMUCOSAL | Status: AC
  Administered 2023-01-27: 15 mL via OROMUCOSAL
  Filled 2023-01-27: qty 15

## 2023-01-27 MED ORDER — ONDANSETRON HCL 4 MG/2ML IJ SOLN
4.0000 mg | Freq: Once | INTRAMUSCULAR | Status: AC
  Administered 2023-01-27: 4 mg via INTRAVENOUS
  Filled 2023-01-27: qty 2

## 2023-01-27 MED ORDER — KETOROLAC TROMETHAMINE 15 MG/ML IJ SOLN
15.0000 mg | Freq: Once | INTRAMUSCULAR | Status: AC
  Administered 2023-01-27: 15 mg via INTRAVENOUS
  Filled 2023-01-27: qty 1

## 2023-01-27 NOTE — Discharge Instructions (Addendum)
Your CT scan showed pneumonia, you are placed on a short course of antibiotics to help treat at this time.  You will need to take 1 tablet twice a day for the next 5 days.  I would like you to have a repeat x-ray in about a week in order to show resolution of the pneumonia.

## 2023-01-27 NOTE — ED Triage Notes (Signed)
Pt came in via POV d/t CP/SOB for the past 3 days. It started with a HA & body aches & it progressed to where she now has Lt sided CP & SOB has worsened. Reports she has a hard time walking, febrile at the highest of 102.8. A/Ox4, rates her pain 10/10 all over her body while in triage. Family at bedside reports that she will "pass out" sometimes, no falls.

## 2023-01-27 NOTE — ED Provider Notes (Signed)
[facility] EMERGENCY DEPARTMENT AT [facility] Provider Note   CSN: [id number] Arrival date & time: 01/27/23  1327     History  Chief Complaint  Patient presents with   SOB   Chest Pain   Syncopal event    Leslie Yates is a 32 y.o. female.  32 y/o female with a PMH of HTN, Asthma, presents to the ED with a chief complaint of headache, body aches, pain all over that has been ongoing for the past 3 days.  She also reports shortness of breath, hard for her to take a deep breath she feels that "all my bones hurt ".  She also describes this chest pain as constricting, reporting that she is having a hard time sleeping is very painful for her to sleep on her sides due to her body aches.  She has taken Tylenol once without any improvement in her symptoms.  She feels like "I have the flu", she is currently not on any steroid therapy to account for her white blood cell count.  She does not have any prior history of CAD, no prior history of blood clots, no urinary symptoms.    The history is provided by the patient.  Chest Pain Associated symptoms: fever and shortness of breath   Associated symptoms: no abdominal pain, no back pain, no nausea and no vomiting        Home Medications Prior to Admission medications   Medication Sig Start Date End Date Taking? Authorizing Provider  acetaminophen (TYLENOL 8 HOUR) 650 MG CR tablet Take 1 tablet (650 mg total) by mouth every 8 (eight) hours for 5 days. 01/27/23 02/01/23 Yes , Leonie Douglas, PA-C  doxycycline (VIBRAMYCIN) 100 MG capsule Take 1 capsule (100 mg total) by mouth 2 (two) times daily for 5 days. 01/27/23 02/01/23 Yes , Leonie Douglas, PA-C  ibuprofen (ADVIL) 600 MG tablet Take 1 tablet (600 mg total) by mouth every 6 (six) hours for 5 days. 01/27/23 02/01/23 Yes , Leonie Douglas, PA-C  albuterol (VENTOLIN HFA) 108 (90 Base) MCG/ACT inhaler Inhale 2 puffs into the lungs every 6 (six) hours as needed for wheezing or shortness of breath. 04/22/20    [name], NP  levonorgestrel (LILETTA, 52 MG,) 20.1 MCG/DAY IUD IUD 1 each by Intrauterine route once.    [provider]  methocarbamol (ROBAXIN) 500 MG tablet Take 1 tablet (500 mg total) by mouth every 8 (eight) hours as needed for muscle spasms. 12/26/21   Benjiman Core, MD  nortriptyline (PAMELOR) 25 MG capsule Take 1 capsule (25 mg total) by mouth at bedtime. 11/01/22   [name], MD  SUMAtriptan (IMITREX) 100 MG tablet Take 1 tablet at onset of migraine. May repeat in 2 hours if headache persists or recurs. Do not take more than 3 a week 11/01/22   [name], MD      Allergies    Penicillins    Review of Systems   Review of Systems  Constitutional:  Positive for chills and fever.  HENT:  Negative for sore throat.   Respiratory:  Positive for shortness of breath.   Cardiovascular:  Positive for chest pain.  Gastrointestinal:  Negative for abdominal pain, nausea and vomiting.  Genitourinary:  Negative for flank pain.  Musculoskeletal:  Negative for back pain.  All other systems reviewed and are negative.   Physical Exam Updated Vital Signs BP 114/66   Pulse 79   Temp 98.1 F (36.7 C) (Oral)   Resp 14  Ht 6' (1.829 m)   Wt 126.1 kg   SpO2 100%   BMI 37.70 kg/m  Physical Exam Vitals and nursing note reviewed.  Constitutional:      Appearance: She is well-developed. She is ill-appearing.  HENT:     Head: Normocephalic.  Cardiovascular:     Rate and Rhythm: Normal rate.  Pulmonary:     Effort: Pulmonary effort is normal. No respiratory distress.     Comments: Lungs are diminished but no wheezing or rales noted.  Chest:     Chest wall: No tenderness.     Comments: No tenderness with palpation.  Abdominal:     Palpations: Abdomen is soft.     Tenderness: There is no abdominal tenderness.  Musculoskeletal:     Cervical back: Normal range of motion and neck supple.     Right lower leg: Edema present.     Left lower leg: Edema present.      Comments: <1 pitting edema noted.  Skin:    General: Skin is warm and dry.  Neurological:     Mental Status: She is alert.     ED Results / Procedures / Treatments   Labs (all labs ordered are listed, but only abnormal results are displayed) Labs Reviewed  BASIC METABOLIC PANEL - Abnormal; Notable for the following components:      Result Value   Potassium 3.2 (*)    CO2 21 (*)    Glucose, Bld 120 (*)    All other components within normal limits  CBC - Abnormal; Notable for the following components:   WBC 21.3 (*)    All other components within normal limits  URINALYSIS, ROUTINE W REFLEX MICROSCOPIC - Abnormal; Notable for the following components:   APPearance HAZY (*)    Hgb urine dipstick MODERATE (*)    Protein, ur 100 (*)    Leukocytes,Ua TRACE (*)    Bacteria, UA RARE (*)    All other components within normal limits  D-DIMER, QUANTITATIVE - Abnormal; Notable for the following components:   D-Dimer, Quant 0.68 (*)    All other components within normal limits  CBG MONITORING, ED - Abnormal; Notable for the following components:   Glucose-Capillary 117 (*)    All other components within normal limits  RESP PANEL BY RT-PCR (RSV, FLU A&B, COVID)  [id number]  GROUP A STREP BY PCR  HCG, SERUM, QUALITATIVE  BRAIN NATRIURETIC PEPTIDE  I-STAT CG4 LACTIC ACID, ED  I-STAT CG4 LACTIC ACID, ED  TROPONIN I (HIGH SENSITIVITY)  TROPONIN I (HIGH SENSITIVITY)    EKG EKG Interpretation Date/Time:  Sunday January 27 2023 13:35:32 EDT Ventricular Rate:  79 PR Interval:  132 QRS Duration:  90 QT Interval:  386 QTC Calculation: 442 R Axis:   23  Text Interpretation: Normal sinus rhythm Normal ECG When compared with ECG of 09-Sep-2022 12:10, PREVIOUS ECG IS PRESENT when compared to prior, more artifact. No STEMI Confirmed by Theda Belfast ([id number]) on 01/27/2023 5:23:28 PM  Radiology CT Angio Chest PE W and/or Wo Contrast  Result Date: 01/27/2023 CLINICAL DATA:  Pulmonary  embolism (PE) suspected, low to intermediate prob, positive D-dimer EXAM: CT ANGIOGRAPHY CHEST WITH CONTRAST TECHNIQUE: Multidetector CT imaging of the chest was performed using the standard protocol during bolus administration of intravenous contrast. Multiplanar CT image reconstructions and MIPs were obtained to evaluate the vascular anatomy. RADIATION DOSE REDUCTION: This exam was performed according to the departmental dose-optimization program which includes automated exposure control, adjustment of the mA and/or  kV according to patient size and/or use of iterative reconstruction technique. CONTRAST:  75mL OMNIPAQUE IOHEXOL 350 MG/ML SOLN COMPARISON:  None Available. FINDINGS: Cardiovascular: No filling defects in the pulmonary arteries to suggest pulmonary emboli. Heart is normal size. Aorta is normal caliber. Mediastinum/Nodes: Borderline mediastinal and left hilar lymph nodes. No pathologically enlarged lymph nodes. Trachea and esophagus are unremarkable. Thyroid unremarkable. Lungs/Pleura: Airspace consolidation medial left upper lobe compatible with pneumonia. Linear atelectasis at the left lung base. Right lung clear. No effusions. Upper Abdomen: No acute findings Musculoskeletal: Chest wall soft tissues are unremarkable. No acute bony abnormality. Review of the MIP images confirms the above findings. IMPRESSION: No evidence of pulmonary embolus. Consolidation in the medial left upper lobe compatible with pneumonia. Electronically Signed   By: Charlett Nose M.D.   On: 01/27/2023 18:14   DG Chest Port 1 View  Result Date: 01/27/2023 CLINICAL DATA:  SOB (shortness of breath) EXAM: PORTABLE CHEST 1 VIEW COMPARISON:  CXR 10/26/18 FINDINGS: No pleural effusion. No pneumothorax. Borderline cardiomegaly. Linear opacity at the left lung base is favored to represent atelectasis. Prominent bilateral interstitial opacities could represent pulmonary venous congestion or small airways disease. No radiographically  apparent displaced rib fractures. Visualized upper abdomen is unremarkable. IMPRESSION: Prominent bilateral interstitial opacities could represent pulmonary venous congestion or small airways disease. Electronically Signed   By: Lorenza Cambridge M.D.   On: 01/27/2023 14:55    Procedures Procedures    Medications Ordered in ED Medications  ondansetron (ZOFRAN) injection 4 mg (4 mg Intravenous Given 01/27/23 1600)  sodium chloride 0.9 % bolus 1,000 mL (0 mLs Intravenous Stopped 01/27/23 1729)  fentaNYL (SUBLIMAZE) injection 50 mcg (50 mcg Intravenous Given 01/27/23 1612)  lidocaine (XYLOCAINE) 2 % viscous mouth solution 15 mL (15 mLs Mouth/Throat Given 01/27/23 1711)  ketorolac (TORADOL) 15 MG/ML injection 15 mg (15 mg Intravenous Given 01/27/23 1757)  iohexol (OMNIPAQUE) 350 MG/ML injection 75 mL (75 mLs Intravenous Contrast Given 01/27/23 1807)    ED Course/ Medical Decision Making/ A&P Clinical Course as of 01/27/23 Everrett Coombe Jan 27, 2023  1509 Leukocytes,Ua(!): TRACE [JS]  1509 Bacteria, UA(!): RARE [JS]  1509 Hgb urine dipstickMarland Kitchen): MODERATE [JS]    Clinical Course User Index [JS] Claude Manges, PA-C                                 Medical Decision Making Amount and/or Complexity of Data Reviewed Labs: ordered. Decision-making details documented in ED Course. Radiology: ordered.  Risk OTC drugs. Prescription drug management.   This patient presents to the ED for concern of chest pain, sob, bodyaches, this involves a number of treatment options, and is a complaint that carries with it a high risk of complications and morbidity.  The differential diagnosis includes ACS, influenza versus PE.    Co morbidities: Discussed in HPI   Brief History:  See HPI  EMR reviewed including pt PMHx, past surgical history and past visits to ER.   See HPI for more details   Lab Tests:  I ordered and independently interpreted labs.  The pertinent results include:    CBC with leukocytosis of  21.3, elevated from baseline, denies any steroid use. BMP with some mild hypokalemia although stable at this time, denies any diarrhea to account for GI losses.  Creatinine levels within normal limits.  hCG is negative.  UA with moderate amount of hemoglobin, trace of leukocytes, rare bacteria, she is  not having any urinary symptoms at this time.  Lactic acid is also negative.  First troponin is 10, will obtain delta.  Influenza and COVID-19 test are negative.  Strep pharyngitis test is also negative.  Troponins have remained flat.   Imaging Studies:  Chest xray showed: IMPRESSION:  Prominent bilateral interstitial opacities could represent pulmonary  venous congestion or small airways disease.       Cardiac Monitoring:  The patient was maintained on a cardiac monitor.  I personally viewed and interpreted the cardiac monitored which showed an underlying rhythm of: NSR 79 EKG non-ischemic   Medicines ordered:  I ordered medication including bolus, zofran  for symptomatic treatment Reevaluation of the patient after these medicines showed that the patient improved I have reviewed the patients home medicines and have made adjustments as needed  Reevaluation:  After the interventions noted above I re-evaluated patient and found that they have :improved  Social Determinants of Health:  The patient's social determinants of health were a factor in the care of this patient  Problem List / ED Course:  Patient with no pertinent medical history presents to the ED with a chief complaint of bodyaches, chest pain, shortness of breath worse with deep inspiration.  Has taken Tylenol over the last 3 days without any improvement in her symptoms.  Has been running a fever of 102 at home, denies any abdominal pain But has had nausea and vomiting.  She denies any urinary symptoms. She does have a leukocytosis of 21,000, this has previously been high in the past, she feels like she has the flu and I am  concerned for some viral illness however have to account for shortness of breath worse with deep inspiration to suggest pulmonary embolism.  She does not have any risk factors, aside from tobacco use which she does daily.  The rest of her blood work such as CMP is unremarkable.  UA does some moderate hemoglobin, rare bacteria and a trace of leukocytes but she is not having any urinary symptoms at this time.  Zofran, bolus has been ordered to help with symptomatic treatment at this time. Respiratory panel is negative for influenza, COVID-19 on today's visit.  I did obtain a D-dimer in the setting of patient feeling short of breath, having chest pain along with worsening symptoms with inspiration.  This was elevated therefore we will proceed with CT angio chest, she has been educated on her next steps. CT angio chest without any pulmonary embolism, however concerning for pneumonia, seeing as patient is been febrile, chills body aches and cough I do feel that is reasonable to treat her at this time.  Will prescribe a short course of doxycycline for the next 5 days twice daily.  She also received Toradol while in the emergency department to help with pain control and bodyaches. Low concern for ACS with delta troponins being normal, EKG normal sinus rhythm and patient improvement after Toradol and pain control. She is agreeable of plan and treatment, patient is hemodynamically stable for discharge.   Dispostion:  After consideration of the diagnostic results and the patients response to treatment, I feel that the patent would benefit from outpatient treatment of CAP with doxycycline for 5 days.   Portions of this note were generated with Scientist, clinical (histocompatibility and immunogenetics). Dictation errors may occur despite best attempts at proofreading.   Final Clinical Impression(s) / ED Diagnoses Final diagnoses:  Myalgia  Pneumonia of left upper lobe due to infectious organism    Rx /  DC Orders ED Discharge Orders           Ordered    doxycycline (VIBRAMYCIN) 100 MG capsule  2 times daily        01/27/23 1825    acetaminophen (TYLENOL 8 HOUR) 650 MG CR tablet  Every 8 hours        01/27/23 1835    ibuprofen (ADVIL) 600 MG tablet  Every 6 hours        01/27/23 1835              Claude Manges, PA-C 01/27/23 1838    Tegeler, Canary Brim, MD 01/27/23 [phone]

## 2023-03-25 DIAGNOSIS — Z1329 Encounter for screening for other suspected endocrine disorder: Secondary | ICD-10-CM | POA: Diagnosis not present

## 2023-03-25 DIAGNOSIS — G40909 Epilepsy, unspecified, not intractable, without status epilepticus: Secondary | ICD-10-CM | POA: Diagnosis not present

## 2023-03-25 DIAGNOSIS — F321 Major depressive disorder, single episode, moderate: Secondary | ICD-10-CM | POA: Diagnosis not present

## 2023-03-25 DIAGNOSIS — G43909 Migraine, unspecified, not intractable, without status migrainosus: Secondary | ICD-10-CM | POA: Diagnosis not present

## 2023-03-25 DIAGNOSIS — Z113 Encounter for screening for infections with a predominantly sexual mode of transmission: Secondary | ICD-10-CM | POA: Diagnosis not present

## 2023-03-25 DIAGNOSIS — Z0000 Encounter for general adult medical examination without abnormal findings: Secondary | ICD-10-CM | POA: Diagnosis not present

## 2023-03-25 DIAGNOSIS — Z13228 Encounter for screening for other metabolic disorders: Secondary | ICD-10-CM | POA: Diagnosis not present

## 2023-03-25 DIAGNOSIS — J45909 Unspecified asthma, uncomplicated: Secondary | ICD-10-CM | POA: Diagnosis not present

## 2023-03-25 DIAGNOSIS — Z13220 Encounter for screening for lipoid disorders: Secondary | ICD-10-CM | POA: Diagnosis not present

## 2023-04-02 ENCOUNTER — Encounter: Payer: Self-pay | Admitting: Obstetrics and Gynecology

## 2023-04-02 DIAGNOSIS — Z113 Encounter for screening for infections with a predominantly sexual mode of transmission: Secondary | ICD-10-CM | POA: Diagnosis not present

## 2023-04-11 DIAGNOSIS — Z0000 Encounter for general adult medical examination without abnormal findings: Secondary | ICD-10-CM | POA: Diagnosis not present

## 2023-04-11 DIAGNOSIS — Z01419 Encounter for gynecological examination (general) (routine) without abnormal findings: Secondary | ICD-10-CM | POA: Diagnosis not present

## 2023-04-11 DIAGNOSIS — Z113 Encounter for screening for infections with a predominantly sexual mode of transmission: Secondary | ICD-10-CM | POA: Diagnosis not present

## 2023-04-18 DIAGNOSIS — H1031 Unspecified acute conjunctivitis, right eye: Secondary | ICD-10-CM | POA: Diagnosis not present

## 2023-05-14 ENCOUNTER — Ambulatory Visit: Payer: Medicaid Other | Admitting: Neurology

## 2023-05-14 ENCOUNTER — Encounter: Payer: Self-pay | Admitting: Neurology

## 2023-05-14 VITALS — BP 108/63 | HR 67 | Ht 72.0 in | Wt 289.4 lb

## 2023-05-14 DIAGNOSIS — F445 Conversion disorder with seizures or convulsions: Secondary | ICD-10-CM

## 2023-05-14 DIAGNOSIS — M25561 Pain in right knee: Secondary | ICD-10-CM | POA: Diagnosis not present

## 2023-05-14 DIAGNOSIS — G43909 Migraine, unspecified, not intractable, without status migrainosus: Secondary | ICD-10-CM | POA: Diagnosis not present

## 2023-05-14 MED ORDER — SUMATRIPTAN SUCCINATE 100 MG PO TABS: ORAL_TABLET | ORAL | 11 refills | Status: AC

## 2023-05-14 MED ORDER — NORTRIPTYLINE HCL 25 MG PO CAPS: ORAL_CAPSULE | ORAL | 3 refills | Status: AC
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
## 2023-05-14 NOTE — Patient Instructions (Signed)
Good to see you.  Increase nortriptyline 25mg : take 2 capsules every night  2. Refills sent for as needed sumatriptan, take at onset of headache  3. Please contact your eye doctor for an earlier appointment  4. Referral will be sent to Ortho for right knee pain  5. Follow-up in 6 months, call for any changes

## 2023-05-17 ENCOUNTER — Telehealth: Payer: Self-pay | Admitting: Neurology

## 2023-05-17 DIAGNOSIS — G43519 Persistent migraine aura without cerebral infarction, intractable, without status migrainosus: Secondary | ICD-10-CM | POA: Diagnosis not present

## 2023-05-17 NOTE — Telephone Encounter (Signed)
Pt called in stating she has a migraine and would like to get a headache cocktail today. She has a driver.

## 2023-05-17 NOTE — Telephone Encounter (Signed)
Pls let patient know that office will be closing soon for cocktail, but we can do a 1-week steroid course to break her headache cycle. She will need to take Prednisone for 7 days with instructions on how to take it written on the prescription. Take it in the morning with food. If she agrees, I will send in Rx, thanks

## 2023-05-17 NOTE — Telephone Encounter (Signed)
How frequent or the headaches (on average, how many days a week/month are they occurring)?  Patient states she has had the headache for 2 weeks and it is progressing. How long do the headaches last? 2 weeks and progressing. Verify what preventative medication and dose you are taking (e.g. topiramate, propranolol, amitriptyline, Emgality, etc)   Verify which rescue medication you are taking (triptan, Advil, Excedrin, Aleve, Ubrelvy, etc)  Sumatriptan and it has not been working for patient. She took 2 last night and it provided no relief. Patient has also been taking her Nortriptyline with no relief.   How often are you taking pain relievers/analgesics/rescue mediction?

## 2023-05-21 NOTE — Telephone Encounter (Signed)
Patient went to ER, thanked me for calling.

## 2023-06-06 DIAGNOSIS — M25561 Pain in right knee: Secondary | ICD-10-CM | POA: Diagnosis not present

## 2023-06-24 DIAGNOSIS — M62830 Muscle spasm of back: Secondary | ICD-10-CM | POA: Diagnosis not present

## 2023-06-25 ENCOUNTER — Ambulatory Visit: Payer: Medicaid Other | Admitting: Nurse Practitioner

## 2023-07-21 DIAGNOSIS — M778 Other enthesopathies, not elsewhere classified: Secondary | ICD-10-CM | POA: Diagnosis not present

## 2023-07-21 DIAGNOSIS — T783XXA Angioneurotic edema, initial encounter: Secondary | ICD-10-CM | POA: Diagnosis not present

## 2023-07-24 DIAGNOSIS — M654 Radial styloid tenosynovitis [de Quervain]: Secondary | ICD-10-CM | POA: Diagnosis not present

## 2023-07-24 DIAGNOSIS — M25561 Pain in right knee: Secondary | ICD-10-CM | POA: Diagnosis not present

## 2023-07-24 DIAGNOSIS — M7631 Iliotibial band syndrome, right leg: Secondary | ICD-10-CM | POA: Diagnosis not present

## 2023-09-27 DIAGNOSIS — M654 Radial styloid tenosynovitis [de Quervain]: Secondary | ICD-10-CM | POA: Diagnosis not present

## 2023-10-16 ENCOUNTER — Telehealth: Payer: Self-pay

## 2023-10-16 NOTE — Telephone Encounter (Signed)
 Front office to send last O.V. to [facility]

## 2023-10-16 NOTE — Telephone Encounter (Signed)
 Amalia Badder, RN from [facility] called RN line regarding shared patient. Reports patient is having surgery tomorrow and requests a copy of most recent office visit. Fax number to be sent to is [phone]. Amalia Badder, RN can be contacted at [phone]  Moira Andrews, RN

## 2023-10-17 DIAGNOSIS — M720 Palmar fascial fibromatosis [Dupuytren]: Secondary | ICD-10-CM | POA: Diagnosis not present

## 2023-10-17 DIAGNOSIS — M654 Radial styloid tenosynovitis [de Quervain]: Secondary | ICD-10-CM | POA: Diagnosis not present

## 2023-10-21 ENCOUNTER — Telehealth (INDEPENDENT_AMBULATORY_CARE_PROVIDER_SITE_OTHER): Payer: Self-pay | Admitting: Primary Care

## 2023-10-21 NOTE — Telephone Encounter (Signed)
 Called pt to confirm appt. Pt did not answer and LVM

## 2023-10-28 ENCOUNTER — Telehealth (INDEPENDENT_AMBULATORY_CARE_PROVIDER_SITE_OTHER): Payer: Self-pay | Admitting: Primary Care

## 2023-10-28 NOTE — Telephone Encounter (Signed)
 Called pt to confirm appt. Pt will be present.

## 2023-10-29 ENCOUNTER — Telehealth (INDEPENDENT_AMBULATORY_CARE_PROVIDER_SITE_OTHER): Payer: Self-pay | Admitting: Primary Care

## 2023-10-29 ENCOUNTER — Ambulatory Visit (INDEPENDENT_AMBULATORY_CARE_PROVIDER_SITE_OTHER): Admitting: Primary Care

## 2023-10-29 NOTE — Telephone Encounter (Signed)
 Called pt about missed appt. Pt did not answer and LVM.

## 2023-12-19 ENCOUNTER — Ambulatory Visit: Payer: Medicaid Other | Admitting: Neurology

## 2023-12-20 ENCOUNTER — Encounter: Payer: Self-pay | Admitting: Neurology

## 2023-12-20 DIAGNOSIS — R590 Localized enlarged lymph nodes: Secondary | ICD-10-CM | POA: Diagnosis not present

## 2024-01-07 DIAGNOSIS — Z113 Encounter for screening for infections with a predominantly sexual mode of transmission: Secondary | ICD-10-CM | POA: Diagnosis not present

## 2024-01-07 DIAGNOSIS — N83291 Other ovarian cyst, right side: Secondary | ICD-10-CM | POA: Diagnosis not present

## 2024-01-07 DIAGNOSIS — Z833 Family history of diabetes mellitus: Secondary | ICD-10-CM | POA: Diagnosis not present

## 2024-01-07 DIAGNOSIS — R1010 Upper abdominal pain, unspecified: Secondary | ICD-10-CM | POA: Diagnosis not present

## 2024-01-07 DIAGNOSIS — Z13220 Encounter for screening for lipoid disorders: Secondary | ICD-10-CM | POA: Diagnosis not present

## 2024-01-07 DIAGNOSIS — Z0000 Encounter for general adult medical examination without abnormal findings: Secondary | ICD-10-CM | POA: Diagnosis not present

## 2024-01-07 DIAGNOSIS — Z114 Encounter for screening for human immunodeficiency virus [HIV]: Secondary | ICD-10-CM | POA: Diagnosis not present

## 2024-01-07 DIAGNOSIS — F32A Depression, unspecified: Secondary | ICD-10-CM | POA: Diagnosis not present

## 2024-01-07 DIAGNOSIS — Z716 Tobacco abuse counseling: Secondary | ICD-10-CM | POA: Diagnosis not present

## 2024-01-07 DIAGNOSIS — Z1329 Encounter for screening for other suspected endocrine disorder: Secondary | ICD-10-CM | POA: Diagnosis not present

## 2024-01-07 DIAGNOSIS — R760 Raised antibody titer: Secondary | ICD-10-CM | POA: Diagnosis not present

## 2024-01-07 DIAGNOSIS — Z1159 Encounter for screening for other viral diseases: Secondary | ICD-10-CM | POA: Diagnosis not present

## 2024-01-30 ENCOUNTER — Encounter: Payer: Self-pay | Admitting: Radiology

## 2024-01-30 ENCOUNTER — Ambulatory Visit: Admitting: Radiology

## 2024-01-30 VITALS — BP 122/74 | HR 64 | Temp 98.4°F | Ht 72.0 in | Wt 301.0 lb

## 2024-01-30 DIAGNOSIS — R102 Pelvic and perineal pain: Secondary | ICD-10-CM | POA: Diagnosis not present

## 2024-01-30 NOTE — Progress Notes (Signed)
 Leslie Yates January 26, 1991 [id number]   History:  33 y.o. G3P2 presents as a new patient. Referred from PCP for history of complex cyst of right ovary. Complains of lower pelvic pain 1 month ago. Symptoms have resolved.   Gynecologic History Patient's last menstrual period was 01/17/2024 (approximate). Period Pattern: (!) Irregular (Liletta ) Contraception/Family planning: IUD Sexually active: yes Last Pap: 2021. Results were: normal   Obstetric History OB History  Gravida Para Term Preterm AB Living  3 2 1 1  0 2  SAB IAB Ectopic Multiple Live Births  0 0 0 0 2    # Outcome Date GA Lbr Len/2nd Weight Sex Type Anes PTL Lv  3 Gravida           2 Term 01/16/21 [redacted]w[redacted]d / 00:11 7 lb 9.3 oz (3.439 kg) F Vag-Spont EPI  LIV  1 Preterm 11/21/19 [redacted]w[redacted]d 08:31 / 00:07 5 lb 1 oz (2.296 kg) F Vag-Spont EPI  LIV       09/06/2021    3:35 PM 06/15/2021    4:01 PM 06/01/2021    3:39 PM  Depression screen PHQ 2/9  Decreased Interest 1 2 2   Down, Depressed, Hopeless 2 1 2   PHQ - 2 Score 3 3 4   Altered sleeping 3 2 2   Tired, decreased energy 3 2 2   Change in appetite 3 3 2   Feeling bad or failure about yourself  1 2 3   Trouble concentrating 1 1 1   Moving slowly or fidgety/restless 0 0 0  Suicidal thoughts 0 0 0  PHQ-9 Score 14 13 14      The following portions of the patient's history were reviewed and updated as appropriate: allergies, current medications, past family history, past medical history, past social history, past surgical history, and problem list.  Review of Systems  All other systems reviewed and are negative.   Past medical history, past surgical history, family history and social history were all reviewed and documented in the EPIC chart.  Exam:  Vitals:   01/30/24 1345  BP: 122/74  Pulse: 64  Temp: 98.4 F (36.9 C)  TempSrc: Oral  SpO2: 99%  Weight: (!) 301 lb (136.5 kg)  Height: 6' (1.829 m)   Body mass index is 40.82 kg/m.  Physical Exam Vitals and nursing  note reviewed. Exam conducted with a chaperone present.  Constitutional:      Appearance: Normal appearance. She is well-developed.  Pulmonary:     Effort: Pulmonary effort is normal.  Abdominal:     General: Abdomen is flat.     Palpations: Abdomen is soft.  Genitourinary:    General: Normal vulva.     Vagina: No vaginal discharge, erythema, bleeding or lesions.     Cervix: Normal. No discharge, friability, lesion or erythema.     Uterus: Normal.      Adnexa: Right adnexa normal and left adnexa normal.  Neurological:     Mental Status: She is alert.  Psychiatric:        Mood and Affect: Mood normal.        Thought Content: Thought content normal.        Judgment: Judgment normal.      Darice Hoit, CMA present for exam  Assessment/Plan:   1. Pelvic pain (Primary) Hx of right ovarian cyst, will repeat u/s to assess if still there/cause of pain. - Urinalysis,Complete w/RFL Culture - Pregnancy, urine - US  PELVIS TRANSVAGINAL NON-OB (TV ONLY); Future   Return for u/s only.  [place]  B WHNP-BC 2:20 PM 01/30/2024

## 2024-01-31 LAB — URINALYSIS, COMPLETE W/RFL CULTURE
Bilirubin Urine: NEGATIVE
Glucose, UA: NEGATIVE
Hyaline Cast: NONE SEEN /LPF
Ketones, ur: NEGATIVE
Nitrites, Initial: NEGATIVE
Protein, ur: NEGATIVE
Specific Gravity, Urine: 1.01 (ref 1.001–1.035)
pH: 7 (ref 5.0–8.0)

## 2024-01-31 LAB — URINE CULTURE
MICRO NUMBER:: 16800687
Result:: NO GROWTH
SPECIMEN QUALITY:: ADEQUATE

## 2024-01-31 LAB — CULTURE INDICATED

## 2024-01-31 LAB — PREGNANCY, URINE: Preg Test, Ur: NEGATIVE

## 2024-02-03 ENCOUNTER — Ambulatory Visit: Payer: Self-pay | Admitting: Radiology

## 2024-02-05 ENCOUNTER — Other Ambulatory Visit

## 2024-02-05 DIAGNOSIS — Z0000 Encounter for general adult medical examination without abnormal findings: Secondary | ICD-10-CM | POA: Diagnosis not present

## 2024-02-05 NOTE — Progress Notes (Signed)
 Name: Leslie Yates Date of visit: 02/05/24  Chief Complaint   Chief Complaint  Patient presents with  . Annual Exam    Subjective  Leslie Yates is a 33 y.o. female who presents today at [facility] for annual exam.   History of Present Illness The patient presents for a comprehensive annual visit.  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 She has expressed interest in transitioning to [employer]. She reports no feelings of depression or symptoms related to it. She maintains regular dental check-ups and has not visited an eye doctor since 2018 when she received an injection for a sac in her eye. Occasionally, she experiences blurred vision that clears upon blinking.  She continues to smoke but is making efforts to quit, having significantly reduced her intake. She does not smoke during work hours.  She recalls an incident where she woke up with a sore nostril, which was so painful that she could barely insert a tissue into it. Upon inspection, she found a scab-like formation with yellow mucus. She reports no similar issues with the other nostril. Currently, she experiences slight soreness inside the nostril.  Social History: Occupation: CNA in the emergency room at [employer] Diet: Consumes salads frequently, drinks water regularly, occasionally drinks Pepsi Tobacco: Smokes cigarettes, making efforts to quit Coffee/Tea/Caffeine-containing Drinks: Consumes caffeine during breaks at work  PAST SURGICAL HISTORY: Carpal tunnel surgery   Behavioral Health Screening  Patient Health Questionnaire-2 Score: 1 (02/05/2024  3:49 PM)      Patient's Depression screening is Negative    Current Outpatient Medications  Medication Instructions  . albuterol  HFA (PROVENTIL  HFA;VENTOLIN  HFA;PROAIR  HFA) 90 mcg/actuation inhaler 2 puffs, inhalation, Every 4 hours PRN  .  albuterol  2.5 mg, nebulization, Every 6 hours PRN  . buPROPion (WELLBUTRIN XL) 150 mg 24 hr tablet Take 150 mg once daily for first 3 days, then 150 mg twice daily  . fluticasone HFA (FLOVENT  HFA) 44 mcg/actuation inhaler 1 puff, inhalation, 2 times daily RT  . levonorgestreL  (Liletta ) 20.4 mcg/24 hrs (8 yrs) 52 mg IUD 1 each  . mupirocin (BACTROBAN) 2 % ointment topical, 3 times daily  . SUMAtriptan  (IMITREX ) 100 mg    PAST MEDICAL, SOCIAL & FAMILY HISTORY:   Medical History[1] Surgical History[2] Family History[3] Social History[4]   Allergies: Penicillins  IMMUNIZATIONS/ HEALTH STATUS    Immunization History  Administered Date(s) Administered  . Dtap, Unspecified 04/20/1991, 06/03/1991, 08/02/1992, 11/29/1995  . HPV, Quadrivalent 08/21/2005, 01/21/2008, 09/29/2008  . Hep A, Unspecified 08/21/2005  . Hep B, Adolescent or Pediatric 01/21/2008, 03/30/2008, 09/29/2008  . Hepatitis A pediatric/adolescent (VAQTA PEDS) 1Y-18Y 01/21/2008  . HiB, Unspecified 04/20/1991, 06/03/1991  . Influenza,split virus, trivalent, PF 03/30/2008  . MMR 08/02/1992, 11/29/1995  . Meningococcal ACWY, Unspecified 02/13/2005  . PPD Test 03/15/2023  . Pfizer SARS-CoV-2 Bivalent 12+ yrs 04/15/2021  . Pfizer SARS-CoV-2 Primary Series 12+ yrs 06/14/2020, 08/16/2020  . Polio, Unspecified 04/20/1991, 06/03/1991, 11/29/1995  . TDAP VACCINE (BOOSTRIX ,ADACEL) 7Y+ 02/13/2005, 06/08/2011, 03/01/2015, 07/01/2018, 10/02/2019, 10/20/2020, 11/04/2020  . mdfluenza, MDCK, trivalent, PF 03/02/2023   Health Maintenance Status       Date Due Completion Dates   Comprehensive Annual Visit Never done ---   Pneumococcal Vaccine: Pediatrics (0 to 5 years) and At-Risk Patients (6-49 Years) (1 of 2 - PCV) Never done ---   Cervical Cancer Screening Never done ---   COVID-19  Vaccine (4 - 2024-25 season) 02/24/2023 04/15/2021, 08/16/2020   Influenza Vaccine (1) 01/24/2024 03/02/2023, 03/30/2008   Depression Monitoring 08/07/2024  02/05/2024   Diabetes Screening 01/06/2025 01/07/2024, 01/07/2024   DTaP/Tdap/Td Vaccines (12 - Td or Tdap) 11/05/2030 11/04/2020, 10/20/2020   Adult RSV (60+ Years or Pregnancy) (1 - 1-dose 75+ series) 02/14/2066 ---     Health Maintenance:  - (>18) Depression screen: The patient denies any present symptoms of depression or anxiety. - (>35) Lipid disorders screening: yes - up to date with PCP. - (<50) Contraception: Liletta  placed 07/09/2022 (good for 8 years) - Cervical cancer screening: was abnormal: unsure of date - Breast Cancer screening: patient has never had a mammogram  Once-lifetime HIV & HepC: HIV & Hep-C screening completed 01/07/2024   Vaccines: - Tdap: up-to-date. - Influenza vaccine: Advised to get vaccine during influenza season Oct-May. - (>65, 19-65 DM/COPD, Ast/CHF) Pneumonia vaccine: N/A. - HPV: Completed vaccination series   ROS  Other systems reviewed and negative. No other complaints.  Objective   Vitals:   02/05/24 1550  BP: 127/71  Pulse: 60  SpO2: 96%       Complete Physical Exam Vitals and nursing note reviewed Chaperone:N/A Constitutional:      Appearance: Normal appearance.  HENT:     Head: Normocephalic.     Right Ear: Tympanic membrane, ear canal and external ear normal.     Left Ear: Tympanic membrane, ear canal and external ear normal.     Ears: minimal ear cerumen noted bilaterally    Mouth/Throat:     Mouth: Mucous membranes are moist.     Pharynx: Oropharynx is clear. No oropharyngeal exudate    Nasal: right enlarged, sore turbinate    Comments: Enlarged tonsils bilaterally Eyes:     Extraocular Movements: Extraocular movements intact.     Conjunctiva/sclera: Conjunctivae normal.     Pupils: Pupils are equal, round, and reactive to light.  Neck:     Vascular: No carotid bruit.     Thyroid : No nodules/masses/tenderness Cardiovascular:     Rate and Rhythm: Normal rate and regular rhythm.     Pulses: Normal pulses.     Heart  sounds: Normal heart sounds.  Pulmonary:     Effort: Pulmonary effort is normal.     Breath sounds: Normal breath sounds.  Abdominal:     General: Bowel sounds are normal. There is no distension.     Palpations: Abdomen is soft. There is no mass.     Tenderness: There is no abdominal tenderness.  Genitourinary:     Deferred to GYN Musculoskeletal:        General: No deformity. Normal range of motion.     Cervical back: Neck supple.     Right lower leg: No edema.     Left lower leg: No edema.  Lymphadenopathy:     Cervical: No cervical adenopathy.  Skin:    General: Skin is warm and dry.     Comments:  Neurological:     General: No focal deficit present.     Mental Status: Alert and oriented to person, place, and time.     Cranial Nerves: CNII-XII grossly intact    Motor: No weakness.     Deep Tendon Reflexes: Reflexes normal.  Psychiatric:        Mood and Affect: Mood normal.        Behavior: Behavior normal.   ASSESSMENT & PLAN   Problem List Items Addressed This Visit   None Visit Diagnoses  Encounter for annual health examination    -  Primary     Nasal sore       Relevant Medications   mupirocin (BACTROBAN) 2 % ointment      No orders of the defined types were placed in this encounter.  Orders Placed This Encounter  Medications  . mupirocin (BACTROBAN) 2 % ointment    Sig: Apply topically 3 (three) times a day for 7 days.    Dispense:  22 g    Refill:  0   Assessment & Plan 1. Encounter for annual health examination (Primary) - Age appropriate Health Maintenance reviewed and updated accordingly - Routine blood work obtained previously - Encourage maintenance of healthy diet including emphasis on intake of vegetables, fruits, legumes, nuts, whole grains and fish.  Sodium intake should be less than 2300 mg/day, and avoiding trans fat entirely - Daily physical activity as tolerated, 150 minutes/week of moderate intensity  exercise such as brisk walking   - Lipid panel results are within normal limits. - The Liletta  device, inserted in 06/2022, remains effective for 8 years. - BMI is currently at 42. 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 - Over-the-counter eye drops such as Blink, Refresh, or Systane recommended for dry eyes. - Encouraged to establish care with an ophthalmologist and continue biannual dental visits. - Weight management strategies discussed, including a target BMI of around 30, exercising for 150 minutes per week, maintaining a healthy diet, and ensuring adequate hydration.  2. Nasal sore - Enlarged right nasal turbinate - Reported soreness in nostril with yellow mucus. - Prescription for Bactroban (mupirocin) ointment provided, with instructions to apply it topically three times daily for a week using a fresh Q-tip each time. - mupirocin (BACTROBAN) 2 % ointment; Apply topically 3 (three) times a day for 7 days.  Dispense: 22 g; Refill: 0  Follow up 1 year for CPE, sooner if needed. Patient verbalized agreement to above plan.  20 minutes was spent reviewing and interpreting prior notes/images/labs, counseling the patient, transmitting prescriptions and arranging follow up.    This note was dictated with voice recognition software. Similar sounding words may be inadvertently transcribed incorrectly. Note partially generated using DAX software. Pt consent obtained prior to use.    Mliss Jenkins Minors, NP        [1] Past Medical History: Diagnosis Date  . Asthma (CMD)   . 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  . Asthma, intrinsic (CMD) 1991/04[birth year]21   Born with it.  . Eczema   . 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 Hypertension 04/16/2020   When i found out i was pregnant  . Migraines   . Nausea and vomiting during pregnancy (CMD) 11/09/2016  . Obesity   . Ovarian cyst    Left ovary  . Seizure     (CMD)   [2] Past Surgical History: Procedure Laterality Date  . CARPAL TUNNEL RELEASE Left   [3] Family History Problem Relation Name Age of Onset  . Diabetes Mother Randy Fenton   . Hypertension Mother Randy Fenton   . Heart failure Father Latyra Jaye   . Breast cancer Sister         in remission  . Anuerysm Brother         died 2012-11-08  . Brain disorder Brother         mass removed 2024 at [facility]  [4]  Social History Tobacco Use  . Smoking status: Every Day    Current packs/day: 1.00    Average packs/day: 1 pack/day for 21.6 years (21.6 ttl pk-yrs)    Types: Cigarettes    Start date: 06/25/2002  . Smokeless tobacco: Never  . Tobacco comments:    In Quit program w/insurance  Vaping Use  . Vaping status: Never Used  Substance Use Topics  . Alcohol use: Yes    Comment: I drink occasionally  . Drug use: Never

## 2024-02-11 ENCOUNTER — Other Ambulatory Visit

## 2024-03-17 ENCOUNTER — Other Ambulatory Visit: Payer: Self-pay

## 2024-03-17 MED ORDER — FLUZONE 0.5 ML IM SUSY
0.5000 mL | PREFILLED_SYRINGE | Freq: Once | INTRAMUSCULAR | 0 refills | Status: AC
  Filled 2024-03-17: qty 0.5, 1d supply, fill #0

## 2024-05-27 DIAGNOSIS — R300 Dysuria: Secondary | ICD-10-CM | POA: Diagnosis not present

## 2024-05-27 DIAGNOSIS — R319 Hematuria, unspecified: Secondary | ICD-10-CM | POA: Diagnosis not present

## 2024-05-27 DIAGNOSIS — J019 Acute sinusitis, unspecified: Secondary | ICD-10-CM | POA: Diagnosis not present

## 2024-06-18 ENCOUNTER — Emergency Department (HOSPITAL_COMMUNITY)

## 2024-06-18 ENCOUNTER — Other Ambulatory Visit: Payer: Self-pay

## 2024-06-18 ENCOUNTER — Emergency Department (HOSPITAL_COMMUNITY): Admission: EM | Admit: 2024-06-18 | Discharge: 2024-06-18 | Disposition: A | Discharge status: Home / Self Care

## 2024-06-18 DIAGNOSIS — R0789 Other chest pain: Secondary | ICD-10-CM | POA: Diagnosis not present

## 2024-06-18 DIAGNOSIS — F17200 Nicotine dependence, unspecified, uncomplicated: Secondary | ICD-10-CM | POA: Diagnosis not present

## 2024-06-18 DIAGNOSIS — U071 COVID-19: Secondary | ICD-10-CM | POA: Diagnosis not present

## 2024-06-18 DIAGNOSIS — B342 Coronavirus infection, unspecified: Secondary | ICD-10-CM

## 2024-06-18 DIAGNOSIS — J45909 Unspecified asthma, uncomplicated: Secondary | ICD-10-CM | POA: Insufficient documentation

## 2024-06-18 DIAGNOSIS — R0602 Shortness of breath: Secondary | ICD-10-CM | POA: Diagnosis present

## 2024-06-18 LAB — RESP PANEL BY RT-PCR (RSV, FLU A&B, COVID)  RVPGX2
Influenza A by PCR: NEGATIVE
Influenza B by PCR: NEGATIVE
Resp Syncytial Virus by PCR: NEGATIVE
SARS Coronavirus 2 by RT PCR: POSITIVE — AB

## 2024-06-18 MED ORDER — PROMETHAZINE HCL 25 MG PO TABS
12.5000 mg | ORAL_TABLET | Freq: Once | ORAL | Status: AC
  Administered 2024-06-18: 12.5 mg via ORAL
  Filled 2024-06-18: qty 1

## 2024-06-18 MED ORDER — PROMETHAZINE HCL 25 MG PO TABS: 25.0000 mg | ORAL_TABLET | Freq: Four times a day (QID) | ORAL | 0 refills | Status: AC | PRN

## 2024-06-18 MED ORDER — ACETAMINOPHEN 500 MG PO TABS
1000.0000 mg | ORAL_TABLET | Freq: Once | ORAL | Status: AC
  Administered 2024-06-18: 1000 mg via ORAL
  Filled 2024-06-18: qty 2

## 2024-06-18 NOTE — ED Notes (Signed)
 Patient transported to X-ray

## 2024-06-18 NOTE — Discharge Instructions (Addendum)
 It was a pleasure meeting with you today.  You tested positive for coronavirus today.  This is a viral upper respiratory infection.  We treat these by managing symptoms largely using over-the-counter medications, see below.  I have also sent phenergan  to your pharmacy to be used as needed for nausea relief.  Read the instructions below on reasons to return to the emergency department and to learn more about your diagnosis.  Use over the counter medications for symptomatic relief as we discussed (musinex as a decongestant, Tylenol  for fever/pain, Motrin /Ibuprofen  for muscle aches). Followup with your primary care doctor in 4 days if your symptoms persist.  You're more than welcome to return to the emergency department if symptoms worsen or become concerning.  Upper Respiratory Infection, Adult  An upper respiratory infection (URI) is also sometimes known as the common cold. Most people improve within 1 week, but symptoms can last up to 2 weeks. A residual cough may last even longer.   URI is most commonly caused by a virus. Viruses are NOT treated with antibiotics. You can easily spread the virus to others by oral contact. This includes kissing, sharing a glass, coughing, or sneezing. Touching your mouth or nose and then touching a surface, which is then touched by another person, can also spread the virus.   TREATMENT  Treatment is directed at relieving symptoms. There is no cure. Antibiotics are not effective, because the infection is caused by a virus, not by bacteria. Treatment may include:  Increased fluid intake. Sports drinks offer valuable electrolytes, sugars, and fluids.  Breathing heated mist or steam (vaporizer or shower).  Eating chicken soup or other clear broths, and maintaining good nutrition.  Getting plenty of rest.  Using gargles or lozenges for comfort.  Controlling fevers with ibuprofen  or acetaminophen  as directed by your caregiver.  Increasing usage of your inhaler if you have  asthma.  Return to work when your temperature has returned to normal.   SEEK MEDICAL CARE IF:  After the first few days, you feel you are getting worse rather than better.  You develop worsening shortness of breath, or brown or red sputum. These may be signs of pneumonia.  You develop yellow or brown nasal discharge or pain in the face, especially when you bend forward. These may be signs of sinusitis.  You develop a fever, swollen neck glands, pain with swallowing, or white areas in the back of your throat. These may be signs of strep throat.

## 2024-06-18 NOTE — ED Triage Notes (Signed)
 Patient arrives via [facility] for flu like symptoms. Patient endorses body aches chills chest pain, nausea, and vomiting. Patient alert oriented. Patient symptoms began yesterday.   EMS vitals 130/80 HR 90 98 on room air CBG 150

## 2024-06-18 NOTE — ED Provider Notes (Signed)
 " [place] [facility] Provider Note   CSN: [id number] Arrival date & time: 06/18/24  1254     Patient presents with: Generalized Body Aches, Chills, Nausea, and Chest Pain   Leslie Yates is a 33 y.o. female.  with pertinent medical history of asthma, tobacco use, sickle cell trait, and seizures.   Patient is here for evaluation of bodyaches, headaches, cough, shortness of breath, chest tightness, fevers, nausea, decreased appetite, and generally feeling unwell that began yesterday evening.  She does note that all of her children have been sick lately with similar symptoms.  She works as a LAWYER at [employer].  She had not tried any over-the-counter medications prior to arrival.  She has had several episodes of emesis and feels as though she is not able to keep food or fluids down.  She reports chest tightness with cough that has improved since Tylenol  was given earlier in the ED.  She also reports headaches and bodyaches have improved with Tylenol  administration.  Denies ear pain or drainage.  Denies dizziness or syncope.  Denies urinary or bowel changes.  She has a history of asthma and tobacco use.  The history is provided by the patient.  Chest Pain      Prior to Admission medications  Medication Sig Start Date End Date Taking? Authorizing Provider  albuterol  (VENTOLIN  HFA) 108 (90 Base) MCG/ACT inhaler Inhale 2 puffs into the lungs every 6 (six) hours as needed for wheezing or shortness of breath. 04/22/20   Celestia Rosaline SQUIBB, NP  buPROPion (WELLBUTRIN XL) 150 MG 24 hr tablet Take 150 mg once daily for first 3 days, then 150 mg twice daily 01/07/24   [provider]  levonorgestrel  (LILETTA , 52 MG,) 20.1 MCG/DAY IUD IUD 1 each by Intrauterine route once. Inserted 01/2021    [provider]  meloxicam (MOBIC) 15 MG tablet TAKE 1 TABLET DAILY WITH FOOD FOR 5-7 DAYS, THEN AS NEEDED 06/06/23   [provider]   methocarbamol  (ROBAXIN ) 500 MG tablet Take 1 tablet (500 mg total) by mouth every 8 (eight) hours as needed for muscle spasms. Patient not taking: Reported on 01/30/2024 12/26/21   Patsey Lot, MD  nortriptyline  (PAMELOR ) 25 MG capsule Take 2 capsules every night Patient not taking: Reported on 01/30/2024 05/14/23   Georjean Darice HERO, MD  SUMAtriptan  (IMITREX ) 100 MG tablet Take 1 tablet at onset of migraine. May repeat in 2 hours if headache persists or recurs. Do not take more than 3 a week 05/14/23   Georjean Darice HERO, MD    Allergies: Penicillins    Review of Systems  Cardiovascular:  Positive for chest pain.    Updated Vital Signs BP (!) 144/78 (BP Location: Left Arm)   Pulse 98   Temp 99.8 F (37.7 C) (Oral)   Resp 20   Ht 6' (1.829 m)   Wt 136.1 kg   SpO2 100%   BMI 40.69 kg/m   Physical Exam Vitals and nursing note reviewed.  Constitutional:      General: She is not in acute distress.    Appearance: Normal appearance. She is well-developed. She is not ill-appearing or toxic-appearing.  HENT:     Head: Normocephalic and atraumatic.     Nose: Congestion present.     Mouth/Throat:     Mouth: Mucous membranes are moist.  Eyes:     General: No scleral icterus.    Extraocular Movements: Extraocular movements intact.  Conjunctiva/sclera: Conjunctivae normal.     Pupils: Pupils are equal, round, and reactive to light.  Cardiovascular:     Rate and Rhythm: Normal rate and regular rhythm.     Pulses: Normal pulses.     Heart sounds: Normal heart sounds.  Pulmonary:     Effort: Pulmonary effort is normal. No respiratory distress.     Breath sounds: Normal breath sounds. No stridor. No wheezing, rhonchi or rales.  Abdominal:     General: Abdomen is flat. Bowel sounds are normal. There is no distension.     Palpations: Abdomen is soft.     Tenderness: There is no abdominal tenderness. There is no guarding.  Musculoskeletal:        General: Normal range of motion.      Cervical back: Normal range of motion. No tenderness.  Lymphadenopathy:     Cervical: No cervical adenopathy.  Skin:    General: Skin is warm and dry.     Capillary Refill: Capillary refill takes less than 2 seconds.     Coloration: Skin is not jaundiced or pale.  Neurological:     Mental Status: She is alert and oriented to person, place, and time.     (all labs ordered are listed, but only abnormal results are displayed) Labs Reviewed  RESP PANEL BY RT-PCR (RSV, FLU A&B, COVID)  [id number] - Abnormal; Notable for the following components:      Result Value   SARS Coronavirus 2 by RT PCR POSITIVE (*)    All other components within normal limits    EKG: None  Radiology: DG Chest 2 View Result Date: 06/18/2024 CLINICAL DATA:  Generalized body aches with chills, nausea and chest pain. EXAM: CHEST - 2 VIEW COMPARISON:  January 27, 2023 FINDINGS: The heart size and mediastinal contours are within normal limits. Both lungs are clear. The visualized skeletal structures are unremarkable. IMPRESSION: No active cardiopulmonary disease. Electronically Signed   By: Suzen Dials M.D.   On: 06/18/2024 13:57    Procedures   Medications Ordered in the ED  acetaminophen  (TYLENOL ) tablet 1,000 mg (1,000 mg Oral Given 06/18/24 1334)    Patient presents to the ED for concern of flulike symptoms, this involves an extensive number of treatment options, and is a complaint that carries with it a high risk of complications and morbidity.  The differential diagnosis includes viral URI, influenza, COVID, RSV, pneumonia.   Co morbidities that complicate the patient evaluation  Asthma Tobacco use Seizures Sickle cell trait   Additional history obtained:  Additional history obtained from  EMS   External records from outside source obtained and reviewed including EMS report    Lab Tests:  I Ordered, and personally interpreted labs.  The pertinent results include: Respiratory panel positive  for coronavirus.   Imaging Studies ordered:  I ordered imaging studies including chest x-ray I independently visualized and interpreted imaging which showed no acute findings I agree with the radiologist interpretation   Cardiac Monitoring:  The patient was maintained on a cardiac monitor.  I personally viewed and interpreted the cardiac monitored which showed an underlying rhythm of: Normal sinus rhythm with no evidence of STEMI   Medicines ordered and prescription drug management:  I ordered medication including Tylenol  and Phenergan  for low-grade fever, body aches, headaches, and nausea. Reevaluation of the patient after these medicines showed that the patient improved   Problem List / ED Course:     Coronavirus positive. Physical exam, vitals, and history are consistent  with viral URI with no acute or concerning findings.  Imaging and cardiac monitoring are normal with no acute findings.  Respiratory panel positive for coronavirus.  Reassuring patient was able to tolerate food and fluid being a sandwich and juice after Phenergan  given.  Spoke with patient about diagnosis, outpatient treatment, return precautions and they verbalized understanding.  Stable for discharge.   Reevaluation:  After the interventions noted above, I reevaluated the patient and found that they have :improved   Dispostion:  After consideration of the diagnostic results and the patients response to treatment, I feel that the patent would benefit from supportive care in the home setting and symptom management using over-the-counter medications with close follow-up in primary care for evaluation of improvement of symptoms.  Prescription Phenergan  sent to pharmacy for nausea.  Return precautions given.    Medical Decision Making Risk Prescription drug management.   This note was produced using Electronics Engineer. While the provider has reviewed and verified all clinical information,  transcription errors may remain.     Final diagnoses:  None    ED Discharge Orders     None          Rosina Almarie LABOR, PA-C 06/18/24 1616    Pamella Ozell LABOR, DO 06/21/24 2011  "

## 2024-06-18 NOTE — ED Notes (Signed)
 Pt ate half a sandwich and rank a gingerale
